# Patient Record
Sex: Female | Born: 1940 | Race: White | Hispanic: No | Smoking: Former smoker
Health system: Southern US, Community
[De-identification: ages and names within clinical notes are randomized; demographics above are authoritative.]

## PROBLEM LIST (undated history)

## (undated) DIAGNOSIS — M549 Dorsalgia, unspecified: Secondary | ICD-10-CM

## (undated) DIAGNOSIS — K449 Diaphragmatic hernia without obstruction or gangrene: Secondary | ICD-10-CM

## (undated) DIAGNOSIS — J45909 Unspecified asthma, uncomplicated: Secondary | ICD-10-CM

## (undated) DIAGNOSIS — I10 Essential (primary) hypertension: Secondary | ICD-10-CM

## (undated) DIAGNOSIS — F1020 Alcohol dependence, uncomplicated: Secondary | ICD-10-CM

## (undated) DIAGNOSIS — M8580 Other specified disorders of bone density and structure, unspecified site: Secondary | ICD-10-CM

## (undated) DIAGNOSIS — M51369 Other intervertebral disc degeneration, lumbar region without mention of lumbar back pain or lower extremity pain: Secondary | ICD-10-CM

## (undated) DIAGNOSIS — F1910 Other psychoactive substance abuse, uncomplicated: Secondary | ICD-10-CM

## (undated) DIAGNOSIS — H33011 Retinal detachment with single break, right eye: Secondary | ICD-10-CM

## (undated) DIAGNOSIS — G8929 Other chronic pain: Secondary | ICD-10-CM

## (undated) DIAGNOSIS — K222 Esophageal obstruction: Secondary | ICD-10-CM

## (undated) DIAGNOSIS — D500 Iron deficiency anemia secondary to blood loss (chronic): Principal | ICD-10-CM

## (undated) DIAGNOSIS — M1990 Unspecified osteoarthritis, unspecified site: Secondary | ICD-10-CM

## (undated) DIAGNOSIS — E538 Deficiency of other specified B group vitamins: Secondary | ICD-10-CM

## (undated) DIAGNOSIS — E785 Hyperlipidemia, unspecified: Secondary | ICD-10-CM

## (undated) DIAGNOSIS — N200 Calculus of kidney: Secondary | ICD-10-CM

## (undated) DIAGNOSIS — G2581 Restless legs syndrome: Secondary | ICD-10-CM

## (undated) DIAGNOSIS — K219 Gastro-esophageal reflux disease without esophagitis: Secondary | ICD-10-CM

## (undated) DIAGNOSIS — M5136 Other intervertebral disc degeneration, lumbar region: Secondary | ICD-10-CM

## (undated) HISTORY — DX: Alcohol dependence, uncomplicated: F10.20

## (undated) HISTORY — DX: Retinal detachment with single break, right eye: H33.011

## (undated) HISTORY — DX: Esophageal obstruction: K22.2

## (undated) HISTORY — DX: Other specified disorders of bone density and structure, unspecified site: M85.80

## (undated) HISTORY — PX: DILATION AND CURETTAGE OF UTERUS: SHX78

## (undated) HISTORY — DX: Diaphragmatic hernia without obstruction or gangrene: K44.9

## (undated) HISTORY — DX: Hyperlipidemia, unspecified: E78.5

## (undated) HISTORY — DX: Unspecified osteoarthritis, unspecified site: M19.90

## (undated) HISTORY — DX: Other psychoactive substance abuse, uncomplicated: F19.10

## (undated) HISTORY — DX: Gastro-esophageal reflux disease without esophagitis: K21.9

## (undated) HISTORY — DX: Deficiency of other specified B group vitamins: E53.8

## (undated) HISTORY — DX: Calculus of kidney: N20.0

## (undated) HISTORY — DX: Restless legs syndrome: G25.81

## (undated) HISTORY — PX: JOINT REPLACEMENT: SHX530

## (undated) HISTORY — DX: Iron deficiency anemia secondary to blood loss (chronic): D50.0

## (undated) HISTORY — PX: ABDOMINOPLASTY: SUR9

## (undated) HISTORY — PX: FACIAL COSMETIC SURGERY: SHX629

## (undated) HISTORY — PX: LIPOSUCTION: SHX10

---

## 1999-02-26 ENCOUNTER — Encounter (HOSPITAL_COMMUNITY): Admission: RE | Admit: 1999-02-26 | Discharge: 1999-03-26 | Payer: Self-pay | Admitting: Family Medicine

## 1999-04-16 ENCOUNTER — Other Ambulatory Visit: Admission: RE | Admit: 1999-04-16 | Discharge: 1999-04-16 | Payer: Self-pay | Admitting: Family Medicine

## 2000-07-10 ENCOUNTER — Other Ambulatory Visit: Admission: RE | Admit: 2000-07-10 | Discharge: 2000-07-10 | Payer: Self-pay | Admitting: Family Medicine

## 2001-09-22 ENCOUNTER — Other Ambulatory Visit: Admission: RE | Admit: 2001-09-22 | Discharge: 2001-09-22 | Payer: Self-pay | Admitting: Family Medicine

## 2001-10-19 ENCOUNTER — Encounter: Admission: RE | Admit: 2001-10-19 | Discharge: 2001-10-19 | Payer: Self-pay | Admitting: Family Medicine

## 2001-10-19 ENCOUNTER — Encounter: Payer: Self-pay | Admitting: Family Medicine

## 2002-12-21 ENCOUNTER — Other Ambulatory Visit: Admission: RE | Admit: 2002-12-21 | Discharge: 2002-12-21 | Payer: Self-pay | Admitting: Family Medicine

## 2003-07-26 ENCOUNTER — Encounter: Admission: RE | Admit: 2003-07-26 | Discharge: 2003-10-24 | Payer: Self-pay

## 2003-10-28 ENCOUNTER — Encounter: Admission: RE | Admit: 2003-10-28 | Discharge: 2004-01-26 | Payer: Self-pay

## 2004-03-13 ENCOUNTER — Encounter: Admission: RE | Admit: 2004-03-13 | Discharge: 2004-03-13 | Payer: Self-pay | Admitting: Family Medicine

## 2004-03-13 ENCOUNTER — Other Ambulatory Visit: Admission: RE | Admit: 2004-03-13 | Discharge: 2004-03-13 | Payer: Self-pay | Admitting: Family Medicine

## 2004-04-06 ENCOUNTER — Ambulatory Visit: Payer: Self-pay | Admitting: Internal Medicine

## 2005-04-09 ENCOUNTER — Ambulatory Visit: Payer: Self-pay | Admitting: Family Medicine

## 2005-04-10 ENCOUNTER — Ambulatory Visit: Payer: Self-pay | Admitting: Family Medicine

## 2005-04-16 ENCOUNTER — Encounter: Admission: RE | Admit: 2005-04-16 | Discharge: 2005-04-16 | Payer: Self-pay | Admitting: Family Medicine

## 2005-04-16 ENCOUNTER — Other Ambulatory Visit: Admission: RE | Admit: 2005-04-16 | Discharge: 2005-04-16 | Payer: Self-pay | Admitting: Family Medicine

## 2005-04-16 ENCOUNTER — Ambulatory Visit: Payer: Self-pay | Admitting: Family Medicine

## 2005-04-16 ENCOUNTER — Encounter: Payer: Self-pay | Admitting: Family Medicine

## 2005-06-14 ENCOUNTER — Ambulatory Visit: Payer: Self-pay | Admitting: Family Medicine

## 2006-03-21 ENCOUNTER — Ambulatory Visit: Payer: Self-pay | Admitting: Internal Medicine

## 2006-06-02 ENCOUNTER — Ambulatory Visit: Payer: Self-pay | Admitting: Family Medicine

## 2006-06-02 LAB — CONVERTED CEMR LAB
Alkaline Phosphatase: 43 units/L (ref 39–117)
Basophils Absolute: 0 10*3/uL (ref 0.0–0.1)
CO2: 31 meq/L (ref 19–32)
Calcium: 9.3 mg/dL (ref 8.4–10.5)
Chloride: 110 meq/L (ref 96–112)
Chol/HDL Ratio, serum: 2.2
Cholesterol: 169 mg/dL (ref 0–200)
Glomerular Filtration Rate, Af Am: 129 mL/min/{1.73_m2}
Glucose, Bld: 101 mg/dL — ABNORMAL HIGH (ref 70–99)
LDL Cholesterol: 83 mg/dL (ref 0–99)
Lymphocytes Relative: 40.2 % (ref 12.0–46.0)
MCV: 89.9 fL (ref 78.0–100.0)
Monocytes Absolute: 0.4 10*3/uL (ref 0.2–0.7)
Monocytes Relative: 7.6 % (ref 3.0–11.0)
Platelets: 410 10*3/uL — ABNORMAL HIGH (ref 150–400)
Sodium: 145 meq/L (ref 135–145)
TSH: 1.35 microintl units/mL (ref 0.35–5.50)
Total Protein: 6.7 g/dL (ref 6.0–8.3)
VLDL: 10 mg/dL (ref 0–40)

## 2006-06-03 HISTORY — PX: LUMBAR DISC SURGERY: SHX700

## 2006-06-09 ENCOUNTER — Other Ambulatory Visit: Admission: RE | Admit: 2006-06-09 | Discharge: 2006-06-09 | Payer: Self-pay | Admitting: Family Medicine

## 2006-06-09 ENCOUNTER — Ambulatory Visit: Payer: Self-pay | Admitting: Family Medicine

## 2006-06-09 ENCOUNTER — Encounter: Payer: Self-pay | Admitting: Family Medicine

## 2006-06-26 ENCOUNTER — Ambulatory Visit: Payer: Self-pay | Admitting: Family Medicine

## 2006-10-20 ENCOUNTER — Ambulatory Visit: Payer: Self-pay | Admitting: Internal Medicine

## 2006-12-17 ENCOUNTER — Ambulatory Visit (HOSPITAL_COMMUNITY): Admission: RE | Admit: 2006-12-17 | Discharge: 2006-12-17 | Payer: Self-pay | Admitting: Urology

## 2006-12-17 ENCOUNTER — Emergency Department (HOSPITAL_COMMUNITY): Admission: EM | Admit: 2006-12-17 | Discharge: 2006-12-17 | Payer: Self-pay | Admitting: Emergency Medicine

## 2006-12-17 ENCOUNTER — Encounter (INDEPENDENT_AMBULATORY_CARE_PROVIDER_SITE_OTHER): Payer: Self-pay | Admitting: *Deleted

## 2007-01-22 ENCOUNTER — Telehealth (INDEPENDENT_AMBULATORY_CARE_PROVIDER_SITE_OTHER): Payer: Self-pay | Admitting: *Deleted

## 2007-02-19 ENCOUNTER — Encounter
Admission: RE | Admit: 2007-02-19 | Discharge: 2007-02-19 | Payer: Self-pay | Admitting: Physical Medicine and Rehabilitation

## 2007-03-01 ENCOUNTER — Encounter
Admission: RE | Admit: 2007-03-01 | Discharge: 2007-03-01 | Payer: Self-pay | Admitting: Physical Medicine and Rehabilitation

## 2007-03-02 ENCOUNTER — Encounter: Payer: Self-pay | Admitting: Family Medicine

## 2007-06-16 ENCOUNTER — Encounter: Admission: RE | Admit: 2007-06-16 | Discharge: 2007-06-16 | Payer: Self-pay | Admitting: Neurosurgery

## 2007-07-15 ENCOUNTER — Inpatient Hospital Stay (HOSPITAL_COMMUNITY): Admission: RE | Admit: 2007-07-15 | Discharge: 2007-07-19 | Payer: Self-pay | Admitting: Neurosurgery

## 2007-08-17 ENCOUNTER — Ambulatory Visit: Payer: Self-pay | Admitting: Family Medicine

## 2007-08-17 LAB — CONVERTED CEMR LAB
ALT: 13 units/L (ref 0–35)
AST: 18 units/L (ref 0–37)
Albumin: 3.8 g/dL (ref 3.5–5.2)
Alkaline Phosphatase: 61 units/L (ref 39–117)
Basophils Absolute: 0.1 10*3/uL (ref 0.0–0.1)
Chloride: 105 meq/L (ref 96–112)
Cholesterol: 178 mg/dL (ref 0–200)
Creatinine, Ser: 0.5 mg/dL (ref 0.4–1.2)
HCT: 35.1 % — ABNORMAL LOW (ref 36.0–46.0)
LDL Cholesterol: 88 mg/dL (ref 0–99)
MCHC: 32.6 g/dL (ref 30.0–36.0)
Neutrophils Relative %: 65 % (ref 43.0–77.0)
Nitrite: NEGATIVE
Protein, U semiquant: NEGATIVE
RBC: 4.03 M/uL (ref 3.87–5.11)
RDW: 12.7 % (ref 11.5–14.6)
Sodium: 139 meq/L (ref 135–145)
TSH: 0.49 microintl units/mL (ref 0.35–5.50)
Total Bilirubin: 0.5 mg/dL (ref 0.3–1.2)
Total CHOL/HDL Ratio: 2.3
Urobilinogen, UA: 0.2
WBC Urine, dipstick: NEGATIVE
WBC: 7 10*3/uL (ref 4.5–10.5)

## 2007-09-14 ENCOUNTER — Encounter: Payer: Self-pay | Admitting: Family Medicine

## 2007-09-14 ENCOUNTER — Other Ambulatory Visit: Admission: RE | Admit: 2007-09-14 | Discharge: 2007-09-14 | Payer: Self-pay | Admitting: Family Medicine

## 2007-09-14 ENCOUNTER — Ambulatory Visit: Payer: Self-pay | Admitting: Family Medicine

## 2007-09-14 DIAGNOSIS — E785 Hyperlipidemia, unspecified: Secondary | ICD-10-CM

## 2007-09-14 DIAGNOSIS — M810 Age-related osteoporosis without current pathological fracture: Secondary | ICD-10-CM | POA: Insufficient documentation

## 2007-10-12 ENCOUNTER — Telehealth: Payer: Self-pay | Admitting: Family Medicine

## 2007-12-10 ENCOUNTER — Ambulatory Visit: Payer: Self-pay | Admitting: Family Medicine

## 2007-12-10 ENCOUNTER — Telehealth: Payer: Self-pay | Admitting: *Deleted

## 2007-12-17 ENCOUNTER — Telehealth: Payer: Self-pay | Admitting: Family Medicine

## 2007-12-21 ENCOUNTER — Telehealth: Payer: Self-pay | Admitting: Family Medicine

## 2007-12-24 ENCOUNTER — Telehealth: Payer: Self-pay | Admitting: Internal Medicine

## 2007-12-28 ENCOUNTER — Telehealth: Payer: Self-pay | Admitting: Internal Medicine

## 2008-01-04 ENCOUNTER — Telehealth: Payer: Self-pay | Admitting: *Deleted

## 2008-01-04 ENCOUNTER — Ambulatory Visit: Payer: Self-pay | Admitting: Family Medicine

## 2008-01-04 ENCOUNTER — Ambulatory Visit: Payer: Self-pay | Admitting: Internal Medicine

## 2008-01-19 ENCOUNTER — Telehealth: Payer: Self-pay | Admitting: Family Medicine

## 2008-01-29 ENCOUNTER — Ambulatory Visit: Payer: Self-pay | Admitting: Critical Care Medicine

## 2008-02-01 ENCOUNTER — Ambulatory Visit (HOSPITAL_COMMUNITY): Admission: RE | Admit: 2008-02-01 | Discharge: 2008-02-01 | Payer: Self-pay | Admitting: Critical Care Medicine

## 2008-02-01 ENCOUNTER — Encounter: Payer: Self-pay | Admitting: Critical Care Medicine

## 2008-02-22 ENCOUNTER — Encounter: Payer: Self-pay | Admitting: Family Medicine

## 2008-04-18 ENCOUNTER — Telehealth: Payer: Self-pay | Admitting: Family Medicine

## 2008-05-05 ENCOUNTER — Telehealth: Payer: Self-pay | Admitting: Internal Medicine

## 2008-05-05 ENCOUNTER — Ambulatory Visit: Payer: Self-pay | Admitting: Critical Care Medicine

## 2008-05-05 DIAGNOSIS — G2581 Restless legs syndrome: Secondary | ICD-10-CM | POA: Insufficient documentation

## 2008-05-05 DIAGNOSIS — J45909 Unspecified asthma, uncomplicated: Secondary | ICD-10-CM

## 2008-05-12 ENCOUNTER — Encounter: Payer: Self-pay | Admitting: Internal Medicine

## 2008-06-01 ENCOUNTER — Ambulatory Visit: Payer: Self-pay | Admitting: Internal Medicine

## 2008-06-03 DIAGNOSIS — K222 Esophageal obstruction: Secondary | ICD-10-CM

## 2008-06-03 HISTORY — DX: Esophageal obstruction: K22.2

## 2008-06-07 ENCOUNTER — Ambulatory Visit: Payer: Self-pay | Admitting: Internal Medicine

## 2008-06-07 ENCOUNTER — Encounter: Payer: Self-pay | Admitting: Internal Medicine

## 2008-06-09 ENCOUNTER — Encounter: Payer: Self-pay | Admitting: Internal Medicine

## 2008-07-21 ENCOUNTER — Ambulatory Visit: Payer: Self-pay | Admitting: Critical Care Medicine

## 2008-11-29 ENCOUNTER — Encounter: Payer: Self-pay | Admitting: Family Medicine

## 2008-11-29 ENCOUNTER — Ambulatory Visit: Payer: Self-pay | Admitting: Internal Medicine

## 2008-12-22 ENCOUNTER — Ambulatory Visit: Payer: Self-pay | Admitting: Critical Care Medicine

## 2009-01-25 ENCOUNTER — Telehealth: Payer: Self-pay | Admitting: Family Medicine

## 2009-03-08 ENCOUNTER — Encounter (INDEPENDENT_AMBULATORY_CARE_PROVIDER_SITE_OTHER): Payer: Self-pay | Admitting: *Deleted

## 2009-03-09 ENCOUNTER — Ambulatory Visit: Payer: Self-pay | Admitting: Critical Care Medicine

## 2009-03-24 ENCOUNTER — Encounter: Payer: Self-pay | Admitting: *Deleted

## 2009-03-29 ENCOUNTER — Encounter: Payer: Self-pay | Admitting: Family Medicine

## 2009-04-05 ENCOUNTER — Ambulatory Visit: Payer: Self-pay | Admitting: Family Medicine

## 2009-04-05 LAB — CONVERTED CEMR LAB
AST: 24 units/L (ref 0–37)
Albumin: 4 g/dL (ref 3.5–5.2)
Alkaline Phosphatase: 50 units/L (ref 39–117)
Basophils Absolute: 0 10*3/uL (ref 0.0–0.1)
Basophils Relative: 0.3 % (ref 0.0–3.0)
Bilirubin, Direct: 0 mg/dL (ref 0.0–0.3)
Blood in Urine, dipstick: NEGATIVE
Calcium: 8.8 mg/dL (ref 8.4–10.5)
Creatinine, Ser: 0.7 mg/dL (ref 0.4–1.2)
GFR calc non Af Amer: 88.4 mL/min (ref >60)
HDL: 89.2 mg/dL (ref >39.00)
Hemoglobin: 11.8 g/dL — ABNORMAL LOW (ref 12.0–15.0)
Ketones, urine, test strip: NEGATIVE
Lymphocytes Relative: 38.3 % (ref 12.0–46.0)
Monocytes Relative: 7.5 % (ref 3.0–12.0)
Neutro Abs: 2.1 10*3/uL (ref 1.4–7.7)
Neutrophils Relative %: 49.5 % (ref 43.0–77.0)
Nitrite: NEGATIVE
Protein, U semiquant: NEGATIVE
RBC: 4.07 M/uL (ref 3.87–5.11)
Sodium: 139 meq/L (ref 135–145)
Total CHOL/HDL Ratio: 3
Triglycerides: 78 mg/dL (ref 0.0–149.0)
Urobilinogen, UA: 0.2
VLDL: 15.6 mg/dL (ref 0.0–40.0)

## 2009-04-18 ENCOUNTER — Ambulatory Visit: Payer: Self-pay | Admitting: Family Medicine

## 2009-04-18 ENCOUNTER — Encounter: Payer: Self-pay | Admitting: Family Medicine

## 2009-04-18 ENCOUNTER — Other Ambulatory Visit: Admission: RE | Admit: 2009-04-18 | Discharge: 2009-04-18 | Payer: Self-pay | Admitting: Family Medicine

## 2009-04-18 LAB — HM PAP SMEAR

## 2009-06-05 ENCOUNTER — Telehealth: Payer: Self-pay | Admitting: Family Medicine

## 2009-06-07 ENCOUNTER — Telehealth: Payer: Self-pay | Admitting: Family Medicine

## 2009-07-20 ENCOUNTER — Ambulatory Visit: Payer: Self-pay | Admitting: Critical Care Medicine

## 2009-07-20 DIAGNOSIS — K219 Gastro-esophageal reflux disease without esophagitis: Secondary | ICD-10-CM

## 2009-09-28 ENCOUNTER — Encounter: Payer: Self-pay | Admitting: *Deleted

## 2009-11-09 ENCOUNTER — Encounter: Admission: RE | Admit: 2009-11-09 | Discharge: 2010-01-02 | Payer: Self-pay | Admitting: Orthopedic Surgery

## 2009-11-16 ENCOUNTER — Ambulatory Visit: Payer: Self-pay | Admitting: Critical Care Medicine

## 2009-12-04 ENCOUNTER — Emergency Department (HOSPITAL_BASED_OUTPATIENT_CLINIC_OR_DEPARTMENT_OTHER): Admission: EM | Admit: 2009-12-04 | Discharge: 2009-12-04 | Payer: Self-pay | Admitting: Emergency Medicine

## 2009-12-05 ENCOUNTER — Telehealth: Payer: Self-pay | Admitting: Family Medicine

## 2009-12-08 ENCOUNTER — Encounter (HOSPITAL_BASED_OUTPATIENT_CLINIC_OR_DEPARTMENT_OTHER): Admission: RE | Admit: 2009-12-08 | Discharge: 2010-03-08 | Payer: Self-pay | Admitting: Internal Medicine

## 2010-01-05 ENCOUNTER — Telehealth (INDEPENDENT_AMBULATORY_CARE_PROVIDER_SITE_OTHER): Payer: Self-pay | Admitting: *Deleted

## 2010-01-08 ENCOUNTER — Telehealth (INDEPENDENT_AMBULATORY_CARE_PROVIDER_SITE_OTHER): Payer: Self-pay | Admitting: *Deleted

## 2010-03-07 ENCOUNTER — Emergency Department (HOSPITAL_COMMUNITY): Admission: EM | Admit: 2010-03-07 | Discharge: 2010-03-07 | Payer: Self-pay | Admitting: Emergency Medicine

## 2010-03-30 LAB — HM MAMMOGRAPHY

## 2010-04-02 ENCOUNTER — Encounter: Payer: Self-pay | Admitting: Family Medicine

## 2010-04-03 ENCOUNTER — Encounter: Payer: Self-pay | Admitting: Family Medicine

## 2010-04-10 ENCOUNTER — Encounter: Payer: Self-pay | Admitting: Family Medicine

## 2010-04-19 ENCOUNTER — Ambulatory Visit: Payer: Self-pay | Admitting: Critical Care Medicine

## 2010-05-18 ENCOUNTER — Telehealth: Payer: Self-pay | Admitting: Family Medicine

## 2010-06-12 ENCOUNTER — Telehealth: Payer: Self-pay | Admitting: Family Medicine

## 2010-06-13 ENCOUNTER — Ambulatory Visit: Admit: 2010-06-13 | Payer: Self-pay | Admitting: Family Medicine

## 2010-06-22 ENCOUNTER — Telehealth: Payer: Self-pay | Admitting: Family Medicine

## 2010-06-22 ENCOUNTER — Ambulatory Visit: Admit: 2010-06-22 | Payer: Self-pay | Admitting: Family Medicine

## 2010-06-25 ENCOUNTER — Encounter: Payer: Self-pay | Admitting: Physical Medicine and Rehabilitation

## 2010-07-03 NOTE — Assessment & Plan Note (Signed)
Summary: Pulmonary OV   Primary Provider/Referring Provider:  Roderick Pee MD  CC:  4 mo follow up.  states breathing is the same-no better or worse.  states qvar is helping a lot with cough-not coughing as much-nonprod.  deneis wheezing and chest tightness.  c/o runny nose x1-1/2 mo.  Marland Kitchen  History of Present Illness: Pulmonary OV:       This is a 70 years old female who presents with cough.  Methacholine challenge Positve.  GERD ppts cough.  Lower airway inflammation with positive response to ICS and PPI Rx.  July 20, 2009 10:50 AM Pt is doing well with minimal cough.  Pt stays on qvar and has no chest pain.  No proair use is noted. No wheeze.  Dyspnea is stable.   Pt denies any significant sore throat, nasal congestion or excess secretions, fever, chills, sweats, unintended weight loss, pleurtic or exertional chest pain, orthopnea PND, or leg swelling Pt denies any increase in rescue therapy over baseline, denies waking up needing it or having any early am or nocturnal exacerbations of coughing/wheezing/or dyspnea.    Asthma History    Asthma Control Assessment:    Age range: 12+ years    Symptoms: 0-2 days/week    Nighttime Awakenings: 0-2/month    Interferes w/ normal activity: some limitations    SABA use (not for EIB): 0-2 days/week    ATAQ questionnaire: 0    FEV1: 2.49 liters (today)    FEV1 Pred: 2.50 liters (today)    Exacerbations requiring oral systemic steroids: 0-1/year    Asthma Control Assessment: Not Well Controlled   Preventive Screening-Counseling & Management  Alcohol-Tobacco     Smoking Status: quit > 6 months  Current Medications (verified): 1)  Wellbutrin Sr 150 Mg Tb12 (Bupropion Hcl) .Marland Kitchen.. 1 Tab @ Bedtime 2)  Zetia 10 Mg Tabs (Ezetimibe) .Marland Kitchen.. 1 By Mouth Daily 3)  Clonazepam 0.5 Mg Tabs (Clonazepam) .Marland Kitchen.. 1 By Mouth At Bedtime As Needed 4)  Adult Aspirin Ec Low Strength 81 Mg Tbec (Aspirin) .Marland Kitchen.. 1 By Mouth Daily 5)  Vitamin D 2000 Unit Tabs  (Cholecalciferol) .Marland Kitchen.. 1 By Mouth Daily 6)  Calcium 600 600 Mg Tabs (Calcium Carbonate) .Marland Kitchen.. 1 By Mouth Daily 7)  Qvar 80 Mcg/act  Aers (Beclomethasone Dipropionate) .... Three Puffs Twice Daily 8)  Fish Oil   Oil (Fish Oil) .... 1200mg  Once Daily 9)  Proair Hfa 108 (90 Base) Mcg/act  Aers (Albuterol Sulfate) .Marland Kitchen.. 1-2 Puffs Every 4-6 Hours As Needed 10)  Neurontin 300 Mg Caps (Gabapentin) .... 2 By Mouth At Bedtime 11)  Omeprazole 40 Mg Cpdr (Omeprazole) .... Take One Tab Once Daily 12)  Pramipexole Dihydrochloride 0.25 Mg Tabs (Pramipexole Dihydrochloride) .... Take One Tab Two Times A Day  Allergies (verified): 1)  ! Penicillin  Past History:  Past medical, surgical, family and social histories (including risk factors) reviewed, and no changes noted (except as noted below).  Past Medical History: Reviewed history from 07/21/2008 and no changes required. 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 lumbar disc surgery 2008 cosmetic facial surgery (eyes, nose) renal stones 2008 abdominoplasty D & C X 2 Liposuction  Family History: Reviewed history from 05/05/2008 and no changes required. father  died at 32, and throat cancer.  Smoker  mother had heart disease, hypertension, smoker fractured hip, and Alzheimer's  one brother had stroke at age 40 smoker  one sister in good health  Social History: Reviewed history from 01/29/2008 and no changes required. Retired Married Alcohol use-no Drug use-no Regular exercise-yes Patient states former smoker.  Positive history of passive tobacco  smoke exposure.   Review of Systems       The patient complains of shortness of breath with activity, productive cough, and nasal congestion/difficulty breathing through nose.  The patient denies shortness of breath at rest, non-productive cough, coughing up blood, chest pain, irregular heartbeats, acid heartburn, indigestion, loss of appetite, weight change, abdominal pain, difficulty swallowing, sore throat, tooth/dental problems, headaches, sneezing, itching, ear ache, anxiety, depression, hand/feet swelling, joint stiffness or pain, rash, change in color of mucus, and fever.    Clinical Reports Reviewed:  PFT's:  01/29/2008: FEF 25/75 %Predicted:  94 FEV1 %Predicted:  99 FEV1/FVC %Predicted:  103 FVC %Predicted:  99   Vital Signs:  Patient profile:   70 year old female Menstrual status:  postmenopausal Height:      66 inches Weight:      166 pounds BMI:     26.89 O2 Sat:      98 % on Room air Temp:     98.0 degrees F oral Pulse rate:   93 / minute BP sitting:   134 / 80  (left arm) Cuff size:   regular  Vitals Entered By: Gweneth Dimitri RN (July 20, 2009 10:37 AM)  O2 Flow:  Room air CC: 4 mo follow up.  states breathing is the same-no better or worse.  states qvar is helping a lot with cough-not coughing as much-nonprod.  deneis wheezing and chest tightness.  c/o runny nose x1-1/2 mo.   Comments Medications reviewed with patient Daytime contact number verified with patient. Gweneth Dimitri RN  July 20, 2009 10:37 AM    Physical Exam  Additional Exam:  Gen: WD WN      WF    in NAD    NCAT Heent:  no jvd, no TMG, no cervical LNademopathy, orophyx clear,  nares with clear watery drainage. 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 Pos response to PPI and ICS,  cyclic cough primary symptom complex plan No change in inhaled medications.   Maintain treatment program as currently prescribed.  Problem # 2:  GERD (ICD-530.81) Assessment: Improved stable gerd plan cont ppi Her updated medication list for this problem includes:    Omeprazole 40 Mg Cpdr (Omeprazole) .Marland Kitchen... Take one tab once daily  Complete Medication List: 1)  Wellbutrin Sr 150 Mg Tb12 (Bupropion hcl) .Marland Kitchen.. 1 tab @ bedtime 2)  Zetia 10 Mg Tabs (Ezetimibe) .Marland Kitchen.. 1 by mouth daily 3)  Clonazepam 0.5 Mg Tabs (Clonazepam) .Marland Kitchen.. 1 by mouth at bedtime as needed 4)  Adult Aspirin Ec Low Strength 81  Mg Tbec (Aspirin) .Marland Kitchen.. 1 by mouth daily 5)  Vitamin D 2000 Unit Tabs (Cholecalciferol) .Marland Kitchen.. 1 by mouth daily 6)  Calcium 600 600 Mg Tabs (Calcium carbonate) .Marland Kitchen.. 1 by mouth daily 7)  Qvar 80 Mcg/act Aers (Beclomethasone dipropionate) .... Three puffs twice daily 8)  Fish Oil Oil (Fish oil) .... 1200mg  once daily 9)  Proair Hfa 108 (90 Base) Mcg/act Aers (Albuterol sulfate) .Marland Kitchen.. 1-2 puffs every 4-6 hours as needed 10)  Neurontin 300 Mg Caps (Gabapentin) .... 2 by mouth at bedtime 11)  Omeprazole 40 Mg Cpdr (Omeprazole) .... Take one tab once daily 12)  Pramipexole Dihydrochloride 0.25 Mg Tabs (Pramipexole dihydrochloride) .... Take one tab two times a day  Other Orders: Est. Patient Level III (04540)  Patient Instructions: 1)  No change in medications 2)  Return in     4     months Prescriptions: QVAR 80 MCG/ACT  AERS (BECLOMETHASONE DIPROPIONATE) Three puffs twice daily Brand medically necessary #1 x 6   Entered and Authorized by:   Storm Frisk MD   Signed by:   Storm Frisk MD on 07/20/2009   Method used:   Electronically to         [facility] [address] #[id number]* (retail)       [address]       [place], [place]  [address]       Ph: [phone]       Fax: [phone]   RxID:   [id number]

## 2010-07-03 NOTE — Miscellaneous (Signed)
Summary: mammogram update   Clinical Lists Changes  Observations: Added new observation of MAMMOGRAM: further imaging of right breast (03/30/2010 11:52)      Preventive Care Screening  Mammogram:    Date:  03/30/2010    Results:  further imaging of right breast

## 2010-07-03 NOTE — Miscellaneous (Signed)
Summary: mammogram   Clinical Lists Changes

## 2010-07-03 NOTE — Progress Notes (Signed)
Summary: refill  Phone Note Call from Patient Call back at Home Phone [phone]   Caller: Patient Call For: wright Reason for Call: Refill Medication Summary of Call: Need refill for quvar 2m, ok to [facility].//[facility] [address] Initial call taken by: Darletta Moll,  January 08, 2010 9:27 AM  Follow-up for Phone Call        Received Qvar rx request.  However, at last OV on 6.16.11, pt was instructed to reduce Qvar to 2 puffs x5 days then 1 puff x5 days then stop.  If cough returned, she was instructed to call us for directions on restart of Qvar.  No phone note regarding pt restarting qvar.  Called, spoke with pt.  Pt states she started back on qvar "on my own"  because she got to coughing "really, really bad" and "couldn't take deep breathing when walking in the mornings."  States since restarting Qvar the sxs are "a lot better."    Dr. Delford Field, pls advise if ok for pt to cont on Qvar.  Thanks! Follow-up by: Gweneth Dimitri RN,  January 08, 2010 9:37 AM  Additional Follow-up for Phone Call Additional follow up Details #1::        she should resume Qvar at two puff two times a day give 1 cannister and 6 refills Additional Follow-up by: Storm Frisk MD,  January 08, 2010 1:10 PM    Additional Follow-up for Phone Call Additional follow up Details #2::    LMOVM  (per pt request), that qvar has been refilled and to take 2 puffs two times a day.   Follow-up by: Vernie Murders,  January 08, 2010 1:26 PM  New/Updated Medications: QVAR 80 MCG/ACT AERS (BECLOMETHASONE DIPROPIONATE) 2 puffs two times a day Prescriptions: QVAR 80 MCG/ACT AERS (BECLOMETHASONE DIPROPIONATE) 2 puffs two times a day  #1 x 6   Entered by:   Vernie Murders   Authorized by:   Storm Frisk MD   Signed by:   Vernie Murders on 01/08/2010   Method used:   Electronically to        [facility] #[id number]* (retail)       [address]       [place], [place]   [address]       Ph: [phone]       Fax: [phone]   RxID:   [id number]

## 2010-07-03 NOTE — Miscellaneous (Signed)
Summary: mammogram update   Clinical Lists Changes  Observations: Added new observation of MAMMOGRAM: normal left (09/28/2009 15:36)      Preventive Care Screening  Mammogram:    Date:  09/28/2009    Results:  normal left

## 2010-07-03 NOTE — Assessment & Plan Note (Signed)
Summary: Pulmonary OV   Primary Provider/Referring Provider:  Roderick Pee MD  CC:  4 month follow up.  Pt states breathing is doing "fine."  Pt states she does have a nonprod cough if she doesn't use qvar.  Denies wheezing and chest tightness.  No complaints.  .  History of Present Illness: Pulmonary OV:       This is a 70 years old female who presents with cough.  Methacholine challenge Positve.  GERD ppts cough.  Lower airway inflammation with positive response to ICS and PPI Rx.  November 16, 2009 2:30 PM On qvar the cough has resolved.  The pt is doing well.  There are no new issues.  She remains on PPI rx. There is no GERD symptoms. Pt denies any significant sore throat, nasal congestion or excess secretions, fever, chills, sweats, unintended weight loss, pleurtic or exertional chest pain, orthopnea PND, or leg swelling Pt denies any increase in rescue therapy over baseline, denies waking up needing it or having any early am or nocturnal exacerbations of coughing/wheezing/or dyspnea. Pt also denies any obvious fluctuation in symptoms wiht weather or environmental change or other alleviating or aggravating factors   Asthma History    Asthma Control Assessment:    Age range: 12+ years    Symptoms: 0-2 days/week    Nighttime Awakenings: 0-2/month    Interferes w/ normal activity: no limitations    SABA use (not for EIB): 0-2 days/week    ATAQ questionnaire: 0    FEV1: 2.49 liters (today)    FEV1 Pred: 2.50 liters (today)    Exacerbations requiring oral systemic steroids: 0-1/year    Asthma Control Assessment: Well Controlled   Preventive Screening-Counseling & Management  Alcohol-Tobacco     Smoking Status: quit > 6 months  Current Medications (verified): 1)  Wellbutrin Sr 150 Mg Tb12 (Bupropion Hcl) .Marland Kitchen.. 1 Tab @ Bedtime 2)  Red Yeast Rice 600 Mg Caps (Red Yeast Rice Extract) .... Take 1 Capsule By Mouth Once A Day 3)  Clonazepam 0.5 Mg Tabs (Clonazepam) .Marland Kitchen.. 1 By Mouth At  Bedtime As Needed 4)  Adult Aspirin Ec Low Strength 81 Mg Tbec (Aspirin) .Marland Kitchen.. 1 By Mouth Daily 5)  Vitamin D 2000 Unit Tabs (Cholecalciferol) .Marland Kitchen.. 1 By Mouth Daily 6)  Calcium 600 600 Mg Tabs (Calcium Carbonate) .Marland Kitchen.. 1 By Mouth Daily 7)  Qvar 80 Mcg/act  Aers (Beclomethasone Dipropionate) .... Three Puffs Twice Daily 8)  Fish Oil   Oil (Fish Oil) .... 1200mg  Once Daily 9)  Proair Hfa 108 (90 Base) Mcg/act  Aers (Albuterol Sulfate) .Marland Kitchen.. 1-2 Puffs Every 4-6 Hours As Needed 10)  Neurontin 300 Mg Caps (Gabapentin) .... 2 By Mouth At Bedtime 11)  Omeprazole 40 Mg Cpdr (Omeprazole) .... Take One Tab Once Daily 12)  Pramipexole Dihydrochloride 0.25 Mg Tabs (Pramipexole Dihydrochloride) .... Take One Tab Two Times A Day  Allergies (verified): 1)  ! Penicillin  Past History:  Past medical, surgical, family and social histories (including risk factors) reviewed, and no changes noted (except as noted below).  Past Medical History: Reviewed history from 07/21/2008 and no changes required. 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 lumbar disc surgery 2008 cosmetic facial surgery (eyes, nose) renal  stones 2008 abdominoplasty D & C X 2 Liposuction  Family History: Reviewed history from 05/05/2008 and no changes required. father died at 53, and throat cancer.  Smoker  mother had heart disease, hypertension, smoker fractured hip, and Alzheimer's  one brother had stroke at age 54 smoker  one sister in good health  Social History: Reviewed history from 01/29/2008 and no changes  required. Retired Married Alcohol use-no Drug use-no Regular exercise-yes Patient states former smoker.  Positive history of passive tobacco smoke exposure.   Review of Systems  The patient denies shortness of breath with activity, shortness of breath at rest, productive cough, non-productive cough, coughing up blood, chest pain, irregular heartbeats, acid heartburn, indigestion, loss of appetite, weight change, abdominal pain, difficulty swallowing, sore throat, tooth/dental problems, headaches, nasal congestion/difficulty breathing through nose, sneezing, itching, ear ache, anxiety, depression, hand/feet swelling, joint stiffness or pain, rash, change in color of mucus, and fever.    Vital Signs:  Patient profile:   70 year old female Menstrual status:  postmenopausal Height:      66 inches Weight:      164 pounds BMI:     26.57 O2 Sat:      99 % on Room air Temp:     98.0 degrees F oral Pulse rate:   85 / minute BP sitting:   144 / 76  (left arm) Cuff size:   regular  Vitals Entered By: Gweneth Dimitri RN (November 16, 2009 2:24 PM)  O2 Flow:  Room air CC: 4 month follow up.  Pt states breathing is doing "fine."  Pt states she does have a nonprod cough if she doesn't use qvar.  Denies wheezing and chest tightness.  No complaints.   Comments Medications reviewed with patient Daytime contact number verified with patient. Gweneth Dimitri RN  November 16, 2009 2:25 PM    Physical Exam  Additional Exam:  Gen: WD WN      WF    in NAD    NCAT Heent:  no jvd, no TMG, no cervical LNademopathy, orophyx clear,  nares with clear watery drainage. Cor: RRR nl s1/s2  no s3/s4  no m r h g Abd: soft NT BSA   no masses  No HSM  no rebound or guarding Ext perfused with no c v e v.d Neuro: intact, moves all 4s, CN II-XII intact, DTRs intact Chest: clear,  no pseudowheeze   Pre-Spirometry FEV1    Value: 2.49 L     Pred: 2.50 L     Impression & Recommendations:  Problem # 1:  ASTHMA, INTRINSIC  (ICD-493.10) Assessment Improved  Mild intermittent asthma ppt by GERD and chronic rhinitis with pos MeCh challenge test but normal BL spirometry. Pos response to PPI and ICS,  cyclic cough primary symptom complex  all improved plan Trial off ICS by weaning to Qvar 80 two puff daily for 5 days, then one puff daily for 5 days then stop Return 6 months  Medications Added to Medication List This Visit: 1)  Red Yeast Rice 600 Mg Caps (Red yeast rice extract) .... Take 1 capsule by mouth once a day 2)  Qvar 80 Mcg/act Aers (Beclomethasone dipropionate) .... Reduce to two  puffs once daily for 5 days then one puff daily for 5 days then stop  Complete Medication List: 1)  Wellbutrin Sr 150 Mg Tb12 (Bupropion hcl) .Marland Kitchen.. 1 tab @ bedtime 2)  Red Yeast Rice 600 Mg Caps (Red yeast rice extract) .... Take 1 capsule  by mouth once a day 3)  Clonazepam 0.5 Mg Tabs (Clonazepam) .Marland Kitchen.. 1 by mouth at bedtime as needed 4)  Adult Aspirin Ec Low Strength 81 Mg Tbec (Aspirin) .Marland Kitchen.. 1 by mouth daily 5)  Vitamin D 2000 Unit Tabs (Cholecalciferol) .Marland Kitchen.. 1 by mouth daily 6)  Calcium 600 600 Mg Tabs (Calcium carbonate) .Marland Kitchen.. 1 by mouth daily 7)  Qvar 80 Mcg/act Aers (Beclomethasone dipropionate) .... Reduce to two  puffs once daily for 5 days then one puff daily for 5 days then stop 8)  Fish Oil Oil (Fish oil) .... 1200mg  once daily 9)  Proair Hfa 108 (90 Base) Mcg/act Aers (Albuterol sulfate) .Marland Kitchen.. 1-2 puffs every 4-6 hours as needed 10)  Neurontin 300 Mg Caps (Gabapentin) .... 2 by mouth at bedtime 11)  Omeprazole 40 Mg Cpdr (Omeprazole) .... Take one tab once daily 12)  Pramipexole Dihydrochloride 0.25 Mg Tabs (Pramipexole dihydrochloride) .... Take one tab two times a day  Other Orders: Est. Patient Level III (81191)  Patient Instructions: 1)  Reduce Qvar to two puff daily for 5 days then one puff daily for 5 days then stop 2)  If the cough returns , call us for direction on restart of qvar 3)  Return [place]  office 6 months

## 2010-07-03 NOTE — Progress Notes (Signed)
Summary: refill  Phone Note Refill Request Message from:  Fax from Pharmacy on June 05, 2009 11:46 AM     New/Updated Medications: PRAMIPEXOLE DIHYDROCHLORIDE 0.25 MG TABS (PRAMIPEXOLE DIHYDROCHLORIDE) take one tab two times a day Prescriptions: PRAMIPEXOLE DIHYDROCHLORIDE 0.25 MG TABS (PRAMIPEXOLE DIHYDROCHLORIDE) take one tab two times a day  #200 x 3   Entered by:   Kern Reap CMA (AAMA)   Authorized by:   Roderick Pee MD   Signed by:   Kern Reap CMA (AAMA) on 06/05/2009   Method used:   Electronically to        [facility] #[id number]* (retail)       [address]       [place], [place]  [address]       Ph: [phone]       Fax: [phone]   RxID:   [id number]

## 2010-07-03 NOTE — Progress Notes (Signed)
Summary: Need Clarrifiaction re: Qvar  Phone Note Outgoing Call   Call placed by: Gweneth Dimitri RN,  January 05, 2010 4:41 PM Call placed to: Patient Summary of Call: Received Qvar rx request.  However, at last OV on 6.16.11, pt was instructed to reduce Qvar to 2 puffs x5 days then 1 puff x5 days then stop.  If cough returned, she was instructed to call us for directions on restart of Qvar.  No phone note regarding pt restarting qvar.  LMOMTCB  for clarrification. Initial call taken by: Gweneth Dimitri RN,  January 05, 2010 4:43 PM  Follow-up for Phone Call        duplicate phone message taken--pls see phone note from 01/08/10 for additonal information.  Gweneth Dimitri RN  January 08, 2010 9:34 AM

## 2010-07-03 NOTE — Progress Notes (Signed)
Summary: refill  Phone Note Refill Request Message from:  Fax from Pharmacy on June 07, 2009 8:56 AM  Refills Requested: Medication #1:  CLONAZEPAM 0.5 MG TABS 1 by mouth at bedtime as needed   Last Refilled: 05/01/2009  Method Requested: Electronic Initial call taken by: Trixie Dredge,  June 07, 2009 8:56 AM    Prescriptions: CLONAZEPAM 0.5 MG TABS (CLONAZEPAM) 1 by mouth at bedtime as needed  #100 x 2   Entered by:   Kern Reap CMA (AAMA)   Authorized by:   Roderick Pee MD   Signed by:   Kern Reap CMA (AAMA) on 06/07/2009   Method used:   Telephoned to ...       [id number]* (retail)       [address]       [place], [place]  [address]       Ph: [phone]       Fax: [phone]   RxID:   [id number]

## 2010-07-03 NOTE — Progress Notes (Signed)
Summary: clonazepam refill  Phone Note Refill Request Message from:  Fax from Pharmacy on December 05, 2009 4:16 PM  Refills Requested: Medication #1:  CLONAZEPAM 0.5 MG TABS 1 by mouth at bedtime as needed Initial call taken by: Kern Reap CMA Duncan Dull),  December 05, 2009 4:16 PM    Prescriptions: CLONAZEPAM 0.5 MG TABS (CLONAZEPAM) 1 by mouth at bedtime as needed  #100 x 1   Entered by:   Kern Reap CMA (AAMA)   Authorized by:   Roderick Pee MD   Signed by:   Kern Reap CMA (AAMA) on 12/05/2009   Method used:   Telephoned to ...       [facility] #[id number]* (retail)       [address]       [place], [place]  [address]       Ph: [phone]       Fax: [phone]   RxID:   [id number]

## 2010-07-03 NOTE — Assessment & Plan Note (Signed)
Summary: Pulmonary OV   Visit Type:  Follow-up Primary Provider/Referring Provider:  Roderick Pee MD  CC:  Pt states cough is reduced and breathing is the same. No new complaints.  History of Present Illness: Pulmonary OV:       This is a 70 years old female who presents with cough.  Methacholine challenge Positve.  GERD ppts cough.  Lower airway inflammation with positive response to ICS and PPI Rx.  November 16, 2009 2:30 PM On qvar the cough has resolved.  The pt is doing well.  There are no new issues.  She remains on PPI rx. There is no GERD symptoms. Pt denies any significant sore throat, nasal congestion or excess secretions, fever, chills, sweats, unintended weight loss, pleurtic or exertional chest pain, orthopnea PND, or leg swelling Pt denies any increase in rescue therapy over baseline, denies waking up needing it or having any early am or nocturnal exacerbations of coughing/wheezing/or dyspnea. Pt also denies any obvious fluctuation in symptoms wiht weather or environmental change or other alleviating or aggravating factors   April 19, 2010 10:56 AM   No real cough,  the qvar helps.  No real heartburn.  No new issues   Pt denies any significant sore throat, nasal congestion or excess secretions, fever, chills, sweats, unintended weight loss, pleurtic or exertional chest pain, orthopnea PND, or leg swelling Pt denies any increase in rescue therapy over baseline, denies waking up needing it or having any early am or nocturnal exacerbations of coughing/wheezing/or dyspnea.   Asthma History    Asthma Control Assessment:    Age range: 12+ years    Symptoms: 0-2 days/week    Nighttime Awakenings: 0-2/month    Interferes w/ normal activity: no limitations    SABA use (not for EIB): 0-2 days/week    ATAQ questionnaire: 0    FEV1: 2.49 liters (today)    FEV1 Pred: 2.50 liters (today)    Exacerbations requiring oral systemic steroids: 0-1/year    Asthma Control Assessment:  Well Controlled   Preventive Screening-Counseling & Management  Alcohol-Tobacco     Smoking Status: quit > 6 months     Year Quit: 1975     Pack years: 4  Current Medications (verified): 1)  Wellbutrin Sr 150 Mg Tb12 (Bupropion Hcl) .Marland Kitchen.. 1 Tab @ Bedtime 2)  Red Yeast Rice 600 Mg Caps (Red Yeast Rice Extract) .... Take 1 Capsule By Mouth Once A Day 3)  Clonazepam 0.5 Mg Tabs (Clonazepam) .Marland Kitchen.. 1 By Mouth At Bedtime As Needed 4)  Adult Aspirin Ec Low Strength 81 Mg Tbec (Aspirin) .Marland Kitchen.. 1 By Mouth Daily 5)  Vitamin D 2000 Unit Tabs (Cholecalciferol) .Marland Kitchen.. 1 By Mouth Daily 6)  Calcium 600 600 Mg Tabs (Calcium Carbonate) .Marland Kitchen.. 1 By Mouth Daily 7)  Fish Oil   Oil (Fish Oil) .... 1200mg  Once Daily 8)  Neurontin 300 Mg Caps (Gabapentin) .... 2 By Mouth At Bedtime 9)  Omeprazole 40 Mg Cpdr (Omeprazole) .... Take One Tab Once Daily 10)  Pramipexole Dihydrochloride 0.25 Mg Tabs (Pramipexole Dihydrochloride) .... Take One Tab Two Times A Day 11)  Qvar 80 Mcg/act Aers (Beclomethasone Dipropionate) .Marland Kitchen.. 1 Puffs Two Times A Day  Allergies (verified): 1)  ! Penicillin  Past History:  Past medical, surgical, family and social histories (including risk factors) reviewed, and no changes noted (except as noted below).  Past Medical History: Reviewed history from 07/21/2008 and no changes required. 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 lumbar disc surgery 2008 cosmetic facial surgery (eyes, nose) renal stones 2008 abdominoplasty D & C X 2 Liposuction  Family History: Reviewed history from 05/05/2008  and no changes required. father died at 84, and throat cancer.  Smoker  mother had heart disease, hypertension, smoker fractured hip, and Alzheimer's  one brother had stroke at age 93 smoker  one sister in good health  Social History: Reviewed history from 01/29/2008 and no changes required. Retired Married Alcohol use-no Drug use-no Regular exercise-yes Patient states former smoker.  Positive history of passive tobacco smoke exposure.   Review of Systems  The patient denies shortness of breath with activity, shortness of breath at rest, productive cough, non-productive cough, coughing up blood, chest pain, irregular heartbeats, acid heartburn, indigestion, loss of appetite, weight change, abdominal pain, difficulty swallowing, sore throat, tooth/dental problems, headaches, nasal congestion/difficulty breathing through nose, sneezing, itching, ear ache, anxiety, depression, hand/feet swelling, joint stiffness or pain, rash, change in color of mucus, and fever.    Vital Signs:  Patient profile:   70 year old female Menstrual status:  postmenopausal Height:      66 inches Weight:      169 pounds BMI:     27.38 O2 Sat:      97 % on Room air Temp:     98.0 degrees F oral Pulse rate:   70 / minute BP sitting:   140 / 80  (left arm) Cuff size:   regular  Vitals Entered By: Zackery Barefoot CMA (April 19, 2010 10:40 AM)  O2 Flow:  Room air CC: Pt states cough is reduced, breathing is the same. No new complaints Comments Medications reviewed with patient Verified contact number and pharmacy with patient Zackery Barefoot CMA  April 19, 2010 10:41 AM    Physical Exam  Additional Exam:  Gen: WD WN      WF    in NAD    NCAT Heent:  no jvd, no TMG, no cervical LNademopathy, orophyx clear,  nares with clear watery drainage. 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 Pos response to PPI and ICS,  cyclic cough primary symptom complex  all improved plan cont ics and ppi rov 6 months  Medications Added to Medication List This Visit: 1)  Qvar 80 Mcg/act Aers (Beclomethasone dipropionate) .Marland Kitchen.. 1 puffs two times a day 2)  Qvar 40 Mcg/act Aers (Beclomethasone dipropionate) .... Two puffs twice daily  Complete Medication List: 1)  Wellbutrin Sr 150 Mg Tb12 (Bupropion hcl) .Marland Kitchen.. 1 tab @ bedtime 2)  Red Yeast Rice 600 Mg Caps (Red yeast rice extract) .... Take 1 capsule by mouth once a day 3)  Clonazepam 0.5  Mg Tabs (Clonazepam) .Marland Kitchen.. 1 by mouth at bedtime as needed 4)  Adult Aspirin Ec Low Strength 81 Mg Tbec (Aspirin) .Marland Kitchen.. 1 by mouth daily 5)  Vitamin D 2000 Unit Tabs (Cholecalciferol) .Marland Kitchen.. 1 by mouth daily 6)  Calcium 600 600 Mg Tabs (Calcium carbonate) .Marland Kitchen.. 1 by mouth daily 7)  Fish Oil Oil (Fish oil) .... 1200mg  once daily 8)  Neurontin 300 Mg Caps (Gabapentin) .... 2 by mouth at bedtime 9)  Omeprazole 40 Mg Cpdr (Omeprazole) .... Take one tab once daily 10)  Pramipexole Dihydrochloride 0.25 Mg Tabs (Pramipexole dihydrochloride) .... Take one tab two times a day 11)  Qvar 40 Mcg/act Aers (Beclomethasone dipropionate) .... Two puffs twice daily  Other Orders: Est. Patient Level III (60737)  Patient Instructions: 1)  Change to Qvar when current Qvar runs out 2)  Return 4-5 months [place]  Prescriptions: QVAR 40 MCG/ACT  AERS (BECLOMETHASONE DIPROPIONATE) Two puffs twice daily  #1 x 6   Entered and Authorized by:   Storm Frisk MD   Signed by:   Storm Frisk MD on 04/19/2010   Method used:    Electronically to        [facility] [address] [phone]* (retail)       [address]       [place], [place]  [address]       Ph: [phone]       Fax: [phone]   RxID:   [id number]     Immunization History:  Influenza Immunization History:    Influenza:  historical (03/19/2010)   Prevention & Chronic Care Immunizations   Influenza vaccine: Historical  (03/19/2010)    Tetanus booster: 06/09/2006: Historical    Pneumococcal vaccine: Pneumovax  (03/10/2008)    H. zoster vaccine: 06/26/2006: Zostavax  Colorectal Screening   Hemoccult: Not documented    Colonoscopy: Location:  [facility].    (04/06/2004)  Other Screening   Pap smear: NEGATIVE FOR INTRAEPITHELIAL LESIONS OR MALIGNANCY.  (04/18/2009)    Mammogram: further imaging of right breast  (03/30/2010)    DXA bone density scan: Not documented   Smoking status: quit > 6 months  (04/19/2010)  Lipids   Total Cholesterol: 232  (04/05/2009)   LDL: 88  (08/17/2007)   LDL Direct: 141.2  (04/05/2009)   HDL: 89.20  (04/05/2009)   Triglycerides: 78.0  (04/05/2009)    SGOT (AST): 24  (04/05/2009)   SGPT (ALT): 17  (04/05/2009)   Alkaline phosphatase: 50  (04/05/2009)   Total bilirubin: 0.7  (04/05/2009)  Self-Management Support :    Lipid self-management support: Not documented

## 2010-07-05 NOTE — Progress Notes (Signed)
Summary: refill  Phone Note Refill Request Message from:  Fax from Pharmacy on May 18, 2010 5:25 PM  Refills Requested: Medication #1:  WELLBUTRIN SR 150 MG TB12 1 tab @ bedtime Initial call taken by: Kern Reap CMA (AAMA),  May 18, 2010 5:25 PM    Prescriptions: WELLBUTRIN SR 150 MG TB12 (BUPROPION HCL) 1 tab @ bedtime  #100 x 3   Entered by:   Kern Reap CMA (AAMA)   Authorized by:   Roderick Pee MD   Signed by:   Kern Reap CMA (AAMA) on 05/18/2010   Method used:   Electronically to        [facility] [address] #[id number]* (retail)       [address]       [place], [place]  [address]       Ph: [phone]       Fax: [phone]   RxID:   [id number]

## 2010-07-05 NOTE — Progress Notes (Signed)
Summary: refill request  Phone Note Refill Request Message from:  Fax from Pharmacy on June 22, 2010 5:46 PM  Refills Requested: Medication #1:  PRAMIPEXOLE DIHYDROCHLORIDE 0.25 MG TABS take one tab two times a day Initial call taken by: Kern Reap CMA Duncan Dull),  June 22, 2010 5:46 PM    Prescriptions: PRAMIPEXOLE DIHYDROCHLORIDE 0.25 MG TABS (PRAMIPEXOLE DIHYDROCHLORIDE) take one tab two times a day  #200 x 3   Entered by:   Kern Reap CMA (AAMA)   Authorized by:   Roderick Pee MD   Signed by:   Kern Reap CMA (AAMA) on 06/22/2010   Method used:   Electronically to        [facility] [address] #[id number]* (retail)       [address]       [place], [place]  [address]       Ph: [phone]       Fax: [phone]   RxID:   [id number]

## 2010-07-05 NOTE — Progress Notes (Signed)
Summary: doc please call  Call back at Home Phone [phone]   Caller: Patient Call For: Roderick Pee MD Summary of Call: Pt would like doc to call her today please  Initial call taken by: Heron Sabins,  June 12, 2010 9:54 AM    New/Updated Medications: ATIVAN 1 MG TABS (LORAZEPAM) Take 1 tablet by mouth three times a day as needed Prescriptions: ATIVAN 1 MG TABS (LORAZEPAM) Take 1 tablet by mouth three times a day as needed  #100 x 1   Entered and Authorized by:   Roderick Pee MD   Signed by:   Roderick Pee MD on 06/12/2010   Method used:   Printed then faxed to ...       [facility] [address] #[id number]* (retail)       [address]       [place], [place]  [address]       Ph: [phone]       Fax: [phone]   RxID:   [id number]

## 2010-07-19 ENCOUNTER — Other Ambulatory Visit: Payer: BC Managed Care – PPO | Admitting: Family Medicine

## 2010-07-19 DIAGNOSIS — Z0000 Encounter for general adult medical examination without abnormal findings: Secondary | ICD-10-CM

## 2010-07-19 DIAGNOSIS — E785 Hyperlipidemia, unspecified: Secondary | ICD-10-CM

## 2010-07-19 LAB — BASIC METABOLIC PANEL
BUN: 14 mg/dL (ref 6–23)
CO2: 27 mEq/L (ref 19–32)
GFR: 70.39 mL/min (ref >60.00)
Glucose, Bld: 86 mg/dL (ref 70–99)
Potassium: 4.5 mEq/L (ref 3.5–5.1)

## 2010-07-19 LAB — CBC WITH DIFFERENTIAL/PLATELET
Basophils Absolute: 0 10*3/uL (ref 0.0–0.1)
Eosinophils Absolute: 0.1 10*3/uL (ref 0.0–0.7)
HCT: 35.1 % — ABNORMAL LOW (ref 36.0–46.0)
Hemoglobin: 11.8 g/dL — ABNORMAL LOW (ref 12.0–15.0)
Lymphs Abs: 1.6 10*3/uL (ref 0.7–4.0)
MCHC: 33.7 g/dL (ref 30.0–36.0)
MCV: 83.6 fl (ref 78.0–100.0)
Monocytes Absolute: 0.5 10*3/uL (ref 0.1–1.0)
Monocytes Relative: 11 % (ref 3.0–12.0)
Neutro Abs: 2.5 10*3/uL (ref 1.4–7.7)
Platelets: 352 10*3/uL (ref 150.0–400.0)
RDW: 16.5 % — ABNORMAL HIGH (ref 11.5–14.6)

## 2010-07-19 LAB — POCT URINALYSIS DIPSTICK
Blood, UA: NEGATIVE
Ketones, UA: NEGATIVE
Protein, UA: NEGATIVE
Spec Grav, UA: 1.02
Urobilinogen, UA: 0.2

## 2010-07-19 LAB — LIPID PANEL
Cholesterol: 251 mg/dL — ABNORMAL HIGH (ref 0–200)
HDL: 95.5 mg/dL (ref >39.00)
Total CHOL/HDL Ratio: 3
Triglycerides: 97 mg/dL (ref 0.0–149.0)
VLDL: 19.4 mg/dL (ref 0.0–40.0)

## 2010-07-19 LAB — LDL CHOLESTEROL, DIRECT: Direct LDL: 142.4 mg/dL

## 2010-07-19 LAB — HEPATIC FUNCTION PANEL
Albumin: 4.1 g/dL (ref 3.5–5.2)
Total Bilirubin: 0.5 mg/dL (ref 0.3–1.2)

## 2010-07-26 ENCOUNTER — Ambulatory Visit (INDEPENDENT_AMBULATORY_CARE_PROVIDER_SITE_OTHER): Payer: BC Managed Care – PPO | Admitting: Family Medicine

## 2010-07-26 ENCOUNTER — Other Ambulatory Visit (HOSPITAL_COMMUNITY)
Admission: RE | Admit: 2010-07-26 | Discharge: 2010-07-26 | Disposition: A | Discharge status: Home / Self Care | Payer: Medicare Other | Source: Ambulatory Visit | Attending: Family Medicine | Admitting: Family Medicine

## 2010-07-26 ENCOUNTER — Encounter: Payer: Self-pay | Admitting: Family Medicine

## 2010-07-26 DIAGNOSIS — E785 Hyperlipidemia, unspecified: Secondary | ICD-10-CM

## 2010-07-26 DIAGNOSIS — Z01419 Encounter for gynecological examination (general) (routine) without abnormal findings: Secondary | ICD-10-CM | POA: Insufficient documentation

## 2010-07-26 DIAGNOSIS — Z0000 Encounter for general adult medical examination without abnormal findings: Secondary | ICD-10-CM

## 2010-07-26 DIAGNOSIS — G2581 Restless legs syndrome: Secondary | ICD-10-CM

## 2010-07-26 DIAGNOSIS — K219 Gastro-esophageal reflux disease without esophagitis: Secondary | ICD-10-CM

## 2010-07-26 DIAGNOSIS — F329 Major depressive disorder, single episode, unspecified: Secondary | ICD-10-CM

## 2010-07-26 DIAGNOSIS — J45909 Unspecified asthma, uncomplicated: Secondary | ICD-10-CM

## 2010-07-26 MED ORDER — PRAMIPEXOLE DIHYDROCHLORIDE 0.25 MG PO TABS: 0.2500 mg | ORAL_TABLET | Freq: Two times a day (BID) | ORAL | Status: DC

## 2010-07-26 MED ORDER — BUPROPION HCL ER (XL) 300 MG PO TB24: 300.0000 mg | ORAL_TABLET | ORAL | Status: DC

## 2010-07-26 MED ORDER — OMEPRAZOLE 40 MG PO CPDR: 40.0000 mg | DELAYED_RELEASE_CAPSULE | Freq: Every day | ORAL | Status: DC

## 2010-07-26 MED ORDER — BECLOMETHASONE DIPROPIONATE 40 MCG/ACT IN AERS: 2.0000 | INHALATION_SPRAY | Freq: Two times a day (BID) | RESPIRATORY_TRACT | Status: DC

## 2010-07-26 MED ORDER — LORAZEPAM 1 MG PO TABS: 1.0000 mg | ORAL_TABLET | Freq: Three times a day (TID) | ORAL | Status: DC | PRN

## 2010-07-26 NOTE — Patient Instructions (Signed)
Continue your current medications except  change the Wellbutrin to 300 mg daily in the morning.  Return p.r.n.

## 2010-07-26 NOTE — Progress Notes (Signed)
  Subjective:    Patient ID: Felicia Cruz, female    DOB: 04-Sep-1940, 70 y.o.   MRN: [id number]  HPI Felicia Cruz Is a delightful, 70 year old recently widowed,,,,,,,, her husband Felicia Cruz, died of pancreatic cancer a couple weeks ago,,,,, who comes in today for general physical examination  She has a history of depression.  She is currently taking Wellbutrin 150 mg b.i.d. Will switch to 300 daily.  She takes lorazepam 1 mg.  Po nightly p.r.n. For sleep.  She takes Prilosec 40 mg q.a.m. To prevent reflux esophagitis.  She takes Mirapex .25 mg b.i.d. For restless leg syndrome.  She is up on her health maintenance.  Activities.  Recommend she go to the hospice grief counseling program   Review of Systems  Constitutional: Negative.   HENT: Negative.   Eyes: Negative.   Respiratory: Negative.   Cardiovascular: Negative.   Gastrointestinal: Negative.   Genitourinary: Negative.   Musculoskeletal: Negative.   Neurological: Negative.   Hematological: Negative.   Psychiatric/Behavioral: Negative.        Objective:   Physical Exam  Constitutional: She appears well-developed and well-nourished.  HENT:  Head: Normocephalic and atraumatic.  Right Ear: External ear normal.  Left Ear: External ear normal.  Nose: Nose normal.  Mouth/Throat: Oropharynx is clear and moist.  Eyes: EOM are normal. Pupils are equal, round, and reactive to light.  Neck: Normal range of motion. Neck supple. No thyromegaly present.  Cardiovascular: Normal rate, regular rhythm, normal heart sounds and intact distal pulses.  Exam reveals no gallop and no friction rub.   No murmur heard. Pulmonary/Chest: Effort normal and breath sounds normal.  Abdominal: Soft. Bowel sounds are normal. She exhibits no distension and no mass. There is no tenderness. There is no rebound.  Genitourinary: Vagina normal and uterus normal. Guaiac negative stool. No vaginal discharge found.       Bilateral breast exam normal  Musculoskeletal:  Normal range of motion.  Lymphadenopathy:    She has no cervical adenopathy.  Neurological: She is alert. She has normal reflexes. No cranial nerve deficit. She exhibits normal muscle tone. Coordination normal.  Skin: Skin is warm and dry.  Psychiatric: She has a normal mood and affect. Her behavior is normal. Judgment and thought content normal.          Assessment & Plan:  Depression,,,,,,,,, plan change Wellbutrin 300 mg daily encouraged her to begin the hospice grief counseling program.  Reflux esophagitis.  Continue Prilosec 40 mg daily.  Mirapex 25 b.i.d. For restless leg syndrome.  Return in one year for follow-up, sooner if any problems

## 2010-08-16 LAB — POCT I-STAT, CHEM 8
BUN: 4 mg/dL — ABNORMAL LOW (ref 6–23)
Calcium, Ion: 1.19 mmol/L (ref 1.12–1.32)
Creatinine, Ser: 0.5 mg/dL (ref 0.4–1.2)
Glucose, Bld: 97 mg/dL (ref 70–99)
TCO2: 25 mmol/L (ref 0–100)

## 2010-08-16 LAB — URINALYSIS, ROUTINE W REFLEX MICROSCOPIC
Glucose, UA: NEGATIVE mg/dL
Hgb urine dipstick: NEGATIVE
Ketones, ur: NEGATIVE mg/dL
Protein, ur: NEGATIVE mg/dL

## 2010-10-15 ENCOUNTER — Telehealth: Payer: Self-pay | Admitting: Family Medicine

## 2010-10-15 MED ORDER — HYDROCODONE-HOMATROPINE 5-1.5 MG/5ML PO SYRP: 2.5000 mL | ORAL_SOLUTION | Freq: Four times a day (QID) | ORAL | Status: AC | PRN

## 2010-10-15 NOTE — Telephone Encounter (Signed)
Hydromet 8 ounces directions one half to 1 teaspoon t.i.d. P.r.n. Cough.  No refills

## 2010-10-15 NOTE — Telephone Encounter (Signed)
rx called in and patient is aware 

## 2010-10-15 NOTE — Telephone Encounter (Signed)
Pt has a cough, laryngitis, body aches and headaches. Pt does not want ov. Pt req med be called in to [facility] [phone]

## 2010-10-16 NOTE — Op Note (Signed)
Felicia Cruz, Felicia Cruz                ACCOUNT NO.:  [id number]   MEDICAL RECORD NO.:  [id number]          PATIENT TYPE:  AMB   LOCATION:  DAY                          FACILITY:  [facility]   PHYSICIAN:  Valetta Fuller, M.D.  DATE OF BIRTH:  1940/12/04   DATE OF PROCEDURE:  12/17/2006  DATE OF DISCHARGE:  12/17/2006                               OPERATIVE REPORT   PREOPERATIVE DIAGNOSIS:  Left distal ureteral calculus.   POSTOPERATIVE DIAGNOSIS:  Left distal ureteral calculus.   PROCEDURE PERFORMED:  Cystoscopy, left retrograde pyelography, left  ureteroscopy, stone basketing and double-J stent placement.   SURGEON:  Valetta Fuller, M.D.   ANESTHESIA:  General.   INDICATIONS:  Felicia Cruz is a 70 year old female with no previous  urologic history.  She has been having some intermittent left-sided back  discomfort and thought it was initially some chronic musculoskeletal  back issues she has had.  Her pain significantly worsened and became  associated with nausea.  She describes a fairly abrupt onset of more  intense pain that is very reminiscent of renal colic.  She presented  this morning to the [facility] emergency room.  The patient's overall  renal function was normal and urinalysis was actually fairly  unremarkable.  The patient again had been having some discomfort for  about a week but things had really intensified early this morning.  She  underwent a stone protocol CT.  This showed no evidence of renal  calculi.  She did have what appeared to be a 7 x 3 mm stone obstructing  the left ureter at the ureterovesical junction with hydronephrosis and  what appeared to be some perinephric extravasation of urine.  I reviewed  that CT and felt that she probably had actually too small stones and the  ureterovesical junction.  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  I was also told that she did have a bulging disk  but was unable to really appreciate that nor was a official report  available at that time.  The patient was initially sent to our office  while I was in surgery and she was assessed by our nurse practitioner.  I then came to see her later in the afternoon and she was continuing to  have significant discomfort.  I explained her that these stones of this  size are potentially passable but could take anywhere from several days  to several weeks for the stone to pass.  She requested urgent  intervention.  Given the location and size of the stone, we felt  ureteroscopy was the most logical approach.  She accepted that  recommendation and appeared to understand the advantages and  disadvantages and potential complications of that procedure.   TECHNIQUE AND FINDINGS:  The patient was brought into the operating  room.  Because of her chronic  back issues with some radicular pain and  this question of a bulging disk we elected to place her in lithotomy  position with her awake to make sure that this was a comfortable  position and we were not exacerbating any musculoskeletal back issues.  Once that was accomplished she had successful induction of general  anesthesia, was then prepped, draped in the usual manner.  Her urethral  meatus showed a little bit of stenosis and was gently dilated.  Cystoscopy was then performed.  The bladder was unremarkable other than  some edema involving the left hemi trigone and ureteral orifice which  was quite narrow.   Retrograde pyelogram was done with an 8-French cone-tip catheter.  Fluoroscopic guidance was used and interpreted in real time by myself.  The patient had what appeared to be two filling defects in the distal  ureter with moderate obstruction and a dilated ureter up to the renal   pelvis.   A guidewire was then placed in left renal pelvis without difficulty.  Initial attempts to engage ureter were unsuccessful due to the edema and  narrowing.  For that reason we used the inside portion of an access  sheath to provide one-step dilation of the distal ureter.  The ureter  was then easily engaged with the ureteroscope and two stones were  encountered in the distal ureter.  These were both basket extracted  without difficulty.  Because of the need for dilation, we felt it  prudent to leave a stent.  We chose a 6-French 24 cm stent and left the  dangle string attached which was secured to the patient's inner thigh.  The patient appeared to tolerate the procedure well and there were no  obvious complications or difficulties.           ______________________________  Valetta Fuller, M.D.  Electronically Signed     DSG/MEDQ  D:  12/17/2006  T:  12/18/2006  Job:  [id number]

## 2010-10-16 NOTE — Discharge Summary (Signed)
Felicia Cruz, Felicia Cruz                ACCOUNT NO.:  [id number]   MEDICAL RECORD NO.:  [id number]          PATIENT TYPE:  INP   LOCATION:  3027                         FACILITY:  [facility]   PHYSICIAN:  Payton Doughty, M.D.      DATE OF BIRTH:  1941/05/14   DATE OF ADMISSION:  07/15/2007  DATE OF DISCHARGE:  07/19/2007                               DISCHARGE SUMMARY   ADMISSION DIAGNOSIS:  Spondylosis, L3-L4, L4-L5, L5-S1.   DISCHARGE DIAGNOSIS:  Spondylosis, L3-L4, L4-L5, L5-S1.   OPERATIVE PROCEDURE:  L3-L4, L4-L5, and L5-S1 laminectomy, diskectomy,  posterior lumbar interbody fusion, segmental pedicle screw fixation and  posterolateral arthrodesis.   COMPLICATIONS:  None.   DISCHARGE STATUS:  Alive and well.   BODY OF TEXT:  This is a 70 year old, right-handed, white girl whose  history and physical is recounted in the chart. She has had pain for  numerous years and has had increasing pain in her back and down her  legs.  Discography was concordant and positive at 4-5 and 5-1, and she  was admitted for fusion.  Medical history is remarkable for  hypercholesterolemia, leg pain, depression, and restless legs.   ALLERGIES:  PENICILLIN.   General exam is intact, neurologic exam is intact with intractable back  and leg pain.   She was admitted after ascertaining normal laboratory values and  underwent the fusion as noted above.  Postoperatively, she has done  well.  There was a dural tear which repaired primarily.  She spent 36  hours down flat and sat up with that and had no problems.  There was no  headache.  There was no evidence of CSF leak on her incision.  Currently  she is up and about off the PCA.  Foley has been removed.  She is eating  and voiding normally.  She is being discharged home to the care of her  family.  Her followup will be in the [facility] offices in about a week  for suture removal.           ______________________________  Payton Doughty, M.D.     MWR/MEDQ   D:  07/19/2007  T:  07/20/2007  Job:  [id number]

## 2010-10-16 NOTE — Op Note (Signed)
Felicia Cruz, Felicia Cruz                ACCOUNT NO.:  [id number]   MEDICAL RECORD NO.:  [id number]          PATIENT TYPE:  INP   LOCATION:  2899                         FACILITY:  [facility]   PHYSICIAN:  Payton Doughty, M.D.      DATE OF BIRTH:  [birth year]   DATE OF PROCEDURE:  07/15/2007  DATE OF DISCHARGE:                               OPERATIVE REPORT   PREOPERATIVE DIAGNOSIS:  Spondylosis L3-L4, L4-L5, and L5-S1.   POSTOPERATIVE DIAGNOSIS:  Spondylosis L3-L4, L4-L5, and L5-S1.   PROCEDURE:  L3-L4, L4-L5, and L5-S1 laminectomy, discectomy, posterior  lumbar interbody fusion with PEEK cages, segmental pedicle screw  fixation from L3 to S1, and posterolateral arthrodesis L3 to S1.   SURGEON:  Payton Doughty, M.D.   ASSISTANT:  Hewitt Shorts, M.D.   ANESTHESIA:  General endotracheal anesthesia.   PREPARATION:  Betadine scrub with alcohol wipe.   COMPLICATIONS:  None.   BODY OF TEXT:  This is a 70 year old lady with severe lumbar  spondylosis.  She is taken to the operating room, smoothly anesthetized,  and intubated.  She was placed prone on the operating table. Following  shave, prep and drape in the usual sterile fashion, the skin was then  infiltrated with 1% lidocaine with 1:100,000 epinephrine.  The skin was  incised from mid L2 to mid S1. The lamina and transverse processes of  L3, L4, L5, S1 and sacral ala were exposed bilaterally in a  subperiosteal plane.  Intraoperative x-ray confirmed correctness of the  level.  Having confirmed correctness of the level, the pars intra-  articularis lamina and inferior facet of L3, L4, L5, and the superior  facet of L4, L5, and S1 were removed bilaterally using a high speed  drill. Particularly at L4-L5, there was severe spondylosis that was  worse on the left than on the right with severe compression of the canal  and the nerve roots as they exited.  L5-S1 was also fairly severely  affected on the left.  On the right side, when taking the  bone down, the  dura was inadvertently entered.  It was repaired primarily with 6-0  Prolene.  To Valsalva, there was no leak.  Following complete  decompression of the L3, L4, L5, and S1 roots bilaterally, PEEK cages  were placed 8 mm at L5-S1 and L4-L5 and 10 mm at L3-L4.  They had  previously been packed with bone harvested from the facet joints.  Pedicle screws were then placed at L3, L4, L5, and S1, and the  transverse processes were decorticated with the high speed drill.  The  rods were connected to the pedicle screws and capped and the final  tightener applied.  BMP on the extender matrix mixed with the remnants  of the patient's own bone was placed over the transverse processes and  sacral ala as intertransverse fusion.  Final x-ray showed good placement  of cages, rods and screws.  Successive layers of 0 Vicryl, 2-0 Vicryl  and 3-0 nylon were used to close.  The patient returned to the recovery  room in good condition.  ______________________________  Payton Doughty, M.D.     MWR/MEDQ  D:  07/15/2007  T:  07/16/2007  Job:  [id number]

## 2010-10-16 NOTE — H&P (Signed)
Felicia Cruz, Felicia Cruz                ACCOUNT NO.:  [id number]   MEDICAL RECORD NO.:  [id number]          PATIENT TYPE:  INP   LOCATION:  3027                         FACILITY:  [facility]   PHYSICIAN:  Payton Doughty, M.D.      DATE OF BIRTH:  12/25/1940   DATE OF ADMISSION:  07/15/2007  DATE OF DISCHARGE:                              HISTORY & PHYSICAL   ADMISSION DIAGNOSIS:  Lumbar spondylosis.   This is a 70 year old right-handed white lady, who has had the back pain  for numerous years.  She has visit with Dr. __________ , had some  steroids which were not very helpful.  I visited with her and obtained a  diskography which was concordant and positive at 3-4, 4-5, and a 5-1 can  be entered, and she is now admitted for fusion at 3-4, 4-5, and 5-1.   MEDICAL HISTORY:  Remarkable for hypercholesterolemia, leg pain,  depression and restless legs.   ALLERGIES:  SHE IS ALLERGIC TO PENICILLIN.   MEDICATIONS:  Are Wellbutrin, Lipitor, Mirapex, calcium, fish oils, and  Advil.   SOCIAL HISTORY:  She does not smoke or drink, and she is retired.   FAMILY HISTORY:  Mom died of MI.  Dad is deceased of cancer.   REVIEW OF SYSTEMS:  Remarkable for weight loss which has been  volitional, wearing glasses.  HEENT:  Normal limits.  She has a  reasonable range of motion in neck.  CHEST:  Clear.  CARDIAC:  Exam is  regular rate and rhythm.  ABDOMEN:  Nontender with no  hepatosplenomegaly.  EXTREMITIES:  Without clubbing or cyanosis.  GU:  Exam deferred.  Peripheral pulses are good.  NEUROLOGICALLY:  She is  awake, alert and oriented.  Her cranial nerves are intact.  Motor exam  demonstrates 5/5 strength throughout the upper and lower extremities.  There is no recurrent sensory deficit.  Reflexes are intact.   Diskography been reviewed above.   CLINICAL IMPRESSION:  Severe lumbar spondylosis.  The plan is for a  lumbar fusion at 3-4, 4-5 and 5-1.  The risks and benefits have been  discussed with her,  and she wishes to proceed.           ______________________________  Payton Doughty, M.D.     MWR/MEDQ  D:  07/15/2007  T:  07/16/2007  Job:  [id number]

## 2010-12-25 ENCOUNTER — Ambulatory Visit (INDEPENDENT_AMBULATORY_CARE_PROVIDER_SITE_OTHER)
Admission: RE | Admit: 2010-12-25 | Discharge: 2010-12-25 | Disposition: A | Discharge status: Home / Self Care | Payer: Medicare Other | Source: Ambulatory Visit | Attending: Family Medicine | Admitting: Family Medicine

## 2010-12-25 ENCOUNTER — Other Ambulatory Visit: Payer: Self-pay | Admitting: Family Medicine

## 2010-12-25 DIAGNOSIS — R058 Other specified cough: Secondary | ICD-10-CM

## 2010-12-25 DIAGNOSIS — R05 Cough: Secondary | ICD-10-CM

## 2010-12-25 DIAGNOSIS — R053 Chronic cough: Secondary | ICD-10-CM

## 2010-12-25 DIAGNOSIS — R059 Cough, unspecified: Secondary | ICD-10-CM

## 2010-12-26 ENCOUNTER — Encounter: Payer: Self-pay | Admitting: Family Medicine

## 2010-12-26 ENCOUNTER — Ambulatory Visit (INDEPENDENT_AMBULATORY_CARE_PROVIDER_SITE_OTHER): Payer: Medicare Other | Admitting: Family Medicine

## 2010-12-26 VITALS — BP 120/80 | Temp 98.1°F | Wt 166.0 lb

## 2010-12-26 DIAGNOSIS — J45909 Unspecified asthma, uncomplicated: Secondary | ICD-10-CM

## 2010-12-26 MED ORDER — PREDNISONE 20 MG PO TABS: ORAL_TABLET | ORAL | Status: DC

## 2010-12-26 MED ORDER — HYDROCODONE-HOMATROPINE 5-1.5 MG/5ML PO SYRP: ORAL_SOLUTION | ORAL | Status: DC

## 2010-12-26 NOTE — Progress Notes (Signed)
  Subjective:    Patient ID: Felicia Cruz, female    DOB: 1940/08/10, 70 y.o.   MRN: [id number]  HPI Catalina Gravel is a 70 year old recently widowed, nonsmoking female, who comes in today with a cough for two months.  She states in May she developed a viral syndrome with headache, congestion voice loss and cough.  Everything are better, but the cough would go away.  She's tried over-the-counter cough medicines and Hydromet, however, the cough persists.  She does have a history of asthma and is taking her inhaled steroid two puffs twice daily.  No fever no sputum production.  Chest x-ray yesterday, normal   Review of Systems    General and pulmonary review of systems otherwise negative Objective:   Physical Exam    Well-developed well-nourished, female, in no acute distress.  Examination of lungs show symmetrical.  Breath sounds with late forced expiratory mild wheezing.  Cardiac exam normal    Assessment & Plan:  Reactive airway disease.  Plan prednisone burst and taper

## 2010-12-26 NOTE — Patient Instructions (Signed)
Take two tablets of the prednisone daily and to your cough diminishes and then taper as directed.  Drink lots of water.  Hydromet one half to 1 teaspoon at bedtime.  For nighttime cough.  Return p.r.n.

## 2011-01-09 ENCOUNTER — Encounter: Payer: Self-pay | Admitting: Family Medicine

## 2011-01-09 ENCOUNTER — Ambulatory Visit (INDEPENDENT_AMBULATORY_CARE_PROVIDER_SITE_OTHER): Payer: Medicare Other | Admitting: Family Medicine

## 2011-01-09 ENCOUNTER — Ambulatory Visit: Payer: Medicare Other | Admitting: Family Medicine

## 2011-01-09 DIAGNOSIS — J45909 Unspecified asthma, uncomplicated: Secondary | ICD-10-CM

## 2011-01-09 DIAGNOSIS — K219 Gastro-esophageal reflux disease without esophagitis: Secondary | ICD-10-CM

## 2011-01-09 DIAGNOSIS — R05 Cough: Secondary | ICD-10-CM | POA: Insufficient documentation

## 2011-01-09 MED ORDER — ESOMEPRAZOLE MAGNESIUM 40 MG PO CPDR: DELAYED_RELEASE_CAPSULE | ORAL | Status: DC

## 2011-01-09 MED ORDER — HYDROCODONE-HOMATROPINE 5-1.5 MG/5ML PO SYRP: ORAL_SOLUTION | ORAL | Status: DC

## 2011-01-09 NOTE — Patient Instructions (Signed)
Drink lots of water.  Nothing to eat or drink for 3 hours prior to bedtime and no caffeine or peppermint.  Stop the Prilosec.  Begin Nexium 40 mg b.i.d.  Continue the Qvar inhaler two puffs twice daily.  Hydromet one half to 1 teaspoon 4 times daily as a cough suppressant.  I will put in a request for a consult with Dr. Delford Field ASAP

## 2011-01-09 NOTE — Progress Notes (Signed)
  Subjective:    Patient ID: Felicia Cruz, female    DOB: 12/07/1940, 70 y.o.   MRN: [id number]  HPICarol is a 70 year old recently widowed female nonsmoker, who comes in today for reevaluation of her cough.  Her cough X. He started about 5 months ago.  We saw her about two weeks ago, and examination at that time was negative, except for some expiratory wheezing bilaterally.  We start on prednisone 40 mg a day and after about a week to 10 days.  She states she does act out in a rash which is extremely pruritic.  She stopped the prednisone and the next day the rash went away.  She also has a history of reflux esophagitis, and she is taking Prilosec 40 mg b.i.d.  She's also had a history of asthma.  She is on her inhaled steroid.  The Qvar 42 puffs b.i.d.  No fever no sputum production.    Review of Systems    General pulmonary, GI, review of systems otherwise negative.  Recent chest x-ray normal Objective:   Physical Exam  Well-developed well-nourished, female, in no acute distress.  Examination of the lung show symmetrical.  Breath sounds again, bilateral wheezing.      Assessment & Plan:  Chronic cough, question secondary to reactive airway disease versus reflux versus a combination of both  Rash secondary to prednisone.  History of asthma.  History of reflux.  Plan drink lots of water.  Continue current medications add cough suppressant 4 times daily.  Change the Prilosec to Nexium also consult with Dr. Paul Dykes ASAP

## 2011-01-10 ENCOUNTER — Ambulatory Visit (INDEPENDENT_AMBULATORY_CARE_PROVIDER_SITE_OTHER): Payer: Medicare Other | Admitting: Adult Health

## 2011-01-10 ENCOUNTER — Encounter: Payer: Self-pay | Admitting: Adult Health

## 2011-01-10 VITALS — BP 130/68 | HR 82 | Temp 97.0°F | Ht 66.0 in | Wt 170.2 lb

## 2011-01-10 DIAGNOSIS — J45909 Unspecified asthma, uncomplicated: Secondary | ICD-10-CM

## 2011-01-10 DIAGNOSIS — R059 Cough, unspecified: Secondary | ICD-10-CM

## 2011-01-10 DIAGNOSIS — R05 Cough: Secondary | ICD-10-CM

## 2011-01-10 MED ORDER — METHYLPREDNISOLONE ACETATE 80 MG/ML IJ SUSP
120.0000 mg | Freq: Once | INTRAMUSCULAR | Status: AC
  Administered 2011-01-10: 120 mg via INTRAMUSCULAR

## 2011-01-10 MED ORDER — BENZONATATE 200 MG PO CAPS: 200.0000 mg | ORAL_CAPSULE | Freq: Three times a day (TID) | ORAL | Status: DC | PRN

## 2011-01-10 NOTE — Assessment & Plan Note (Addendum)
Flare with upper airway inflammation.  cxr reviewed w/ no acute process.  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   Plan  Depo medrol injection 120mg  x 1  Delsym 2 tsp Twice daily  (for cough) Tessalon Three times a day  (for cough) Hydromet 1-2 tsp every 4-6 hr As needed  Cough -this can make you sleepy Add Pepcid 20mg  At bedtime   Add Zyrtec 10mg  daily  Add Chlorpheneramine (Chlor tabs ) 4mg   2 At bedtime   AVOID all mints.  Use sugarless candy , water to help avoid cough or throat clearing.  GERD diet  Cont on Nexium 40mg  daily before meals.  Hold all oil bases vitamins for now.  follow up Dr. Delford Field  In 2 weeks and As needed   Please contact office for sooner follow up if symptoms do not improve or worsen or seek emergency care

## 2011-01-10 NOTE — Progress Notes (Signed)
  Subjective:    Patient ID: Felicia Cruz, female    DOB: 03-22-1941, 70 y.o.   MRN: [id number]  HPI 70  years old female who presents with cough. Methacholine challenge Positve. GERD ppts cough. Lower airway inflammation with positive response to ICS and PPI Rx.   November 16, 2009 2:30 PM  On qvar the cough has resolved. The pt is doing well. There are no new issues. She remains on PPI rx.  There is no GERD symptoms.    April 19, 2010  No real cough, the qvar helps. No real heartburn. No new issues >>no changes   01/10/2011 Acute OV  Pt presents for a work in visit for persistent cough for 3 months. Complains of asthma flare with prod cough with white mucus x76months.  Says she developed a cold with sore throat 3 months ago, got some better but cough never went away. Cough has been progressively getting worse. Seen by PCP 2 weeks ago, tx with steroid taper. CXR with no acute process. No better and was seen by PCP yesterday given hydromet. She  Complains of tickle in throat, coughing fits (that are causing urinary incontinence).  No vomitting , dysphagia, chest pain, no edema, or reflux. No sinus pain or pressure. Some wheezing on/off. Did not think prednisone helped, caused her to feel itchy.     Review of Systems Constitutional:   No  weight loss, night sweats,  Fevers, chills  HEENT:   No headaches,  Difficulty swallowing,  Tooth/dental problems,                 No sneezing, itching, ear ache, nasal congestion, post nasal drip,   CV:  No chest pain,  Orthopnea, PND, swelling in lower extremities, anasarca, dizziness, palpitations, syncope.   GI  No heartburn, indigestion, abdominal pain, nausea, vomiting, diarrhea, change in bowel habits, loss of appetite, bloody stools.   Resp: No shortness of breath with exertion or at rest.  No coughing up of blood.     No chest wall deformity  Skin: no rash or lesions.  GU: no dysuria, change in color of urine, no urgency or frequency.  No flank  pain, no hematuria   MS:  No joint pain or swelling.  No decreased range of motion.  No back pain.  Psych:  No change in mood or affect. No depression or anxiety.  No memory loss.          Objective:   Physical Exam GEN: A/Ox3; pleasant , NAD,  Frequent cough during exam.   HEENT:  [place]/AT,  EACs-clear, TMs-wnl, NOSE-clear, THROAT-clear, no lesions, no postnasal drip or exudate noted.   NECK:  Supple w/ fair ROM; no JVD; normal carotid impulses w/o bruits; no thyromegaly or nodules palpated; no lymphadenopathy.  RESP  Coarse BS w/ no wheezing.no accessory muscle use, no dullness to percussion  CARD:  RRR, no m/r/g  , no peripheral edema, pulses intact, no cyanosis or clubbing.  GI:   Soft & nt; nml bowel sounds; no organomegaly or masses detected.  Musco: Warm bil, no deformities or joint swelling noted.   Neuro: alert, no focal deficits noted.    Skin: Warm, no lesions or rashes         Assessment & Plan:

## 2011-01-10 NOTE — Patient Instructions (Signed)
Delsym 2 tsp Twice daily  (for cough) Tessalon Three times a day  (for cough) Hydromet 1-2 tsp every 4-6 hr As needed  Cough -this can make you sleepy Add Pepcid 20mg  At bedtime   Add Zyrtec 10mg  daily  Add Chlorpheneramine (Chlor tabs ) 4mg   2 At bedtime   AVOID all mints.  Use sugarless candy , water to help avoid cough or throat clearing.  GERD diet  Cont on Nexium 40mg  daily before meals.  Hold all oil bases vitamins for now.  follow up Dr. Delford Field  In 2 weeks and As needed   Please contact office for sooner follow up if symptoms do not improve or worsen or seek emergency care

## 2011-01-16 ENCOUNTER — Other Ambulatory Visit: Payer: Self-pay | Admitting: *Deleted

## 2011-01-16 DIAGNOSIS — F329 Major depressive disorder, single episode, unspecified: Secondary | ICD-10-CM

## 2011-01-16 MED ORDER — LORAZEPAM 1 MG PO TABS: 1.0000 mg | ORAL_TABLET | Freq: Three times a day (TID) | ORAL | Status: DC | PRN

## 2011-01-28 ENCOUNTER — Ambulatory Visit (INDEPENDENT_AMBULATORY_CARE_PROVIDER_SITE_OTHER): Payer: Medicare Other | Admitting: Critical Care Medicine

## 2011-01-28 ENCOUNTER — Telehealth: Payer: Self-pay | Admitting: Internal Medicine

## 2011-01-28 ENCOUNTER — Encounter: Payer: Self-pay | Admitting: Critical Care Medicine

## 2011-01-28 DIAGNOSIS — J45909 Unspecified asthma, uncomplicated: Secondary | ICD-10-CM

## 2011-01-28 DIAGNOSIS — R05 Cough: Secondary | ICD-10-CM

## 2011-01-28 DIAGNOSIS — K219 Gastro-esophageal reflux disease without esophagitis: Secondary | ICD-10-CM

## 2011-01-28 MED ORDER — HYDROCOD POLST-CPM POLST ER 10-8 MG PO CP12: 1.0000 | ORAL_CAPSULE | Freq: Two times a day (BID) | ORAL | Status: DC | PRN

## 2011-01-28 MED ORDER — BECLOMETHASONE DIPROPIONATE 40 MCG/ACT IN AERS: INHALATION_SPRAY | RESPIRATORY_TRACT | Status: DC

## 2011-01-28 NOTE — Assessment & Plan Note (Signed)
History of reactive airway disease, now more cyclical cough failed high dose PPI plus H2 blocker.  Failed antihistamine and cyclical cough protocol. Now clear hx of dysphagia and regurgitation of food.  ?esophageal dysmotility vs stricture. Pt sees Lina Sar for colon care   Plan Referral to GI dr Juanda Chance for eval ?EGD of dysphagia with cough syndrome Stop qvar, may be aggravating cough Stop zyrtec/chlorpheniramine Stop pepcid Cont PPI BID Cont reflux diet Stop delsym Use tessalon around the clock 100- 200mg  tid Stop hydromet and try Tussicaps bid prn for severe cough paroxysms Rov 6 weeks for f/u

## 2011-01-28 NOTE — Progress Notes (Signed)
Subjective:    Patient ID: Felicia Cruz, female    DOB: [birth year], 70 y.o.   MRN: [id number]  Cough This is a chronic problem. The current episode started more than 1 month ago. The problem has been unchanged. The cough is non-productive. Associated symptoms include a sore throat. Pertinent negatives include no chest pain, chills, ear congestion, ear pain, fever, headaches, heartburn, hemoptysis, nasal congestion, postnasal drip, rash, rhinorrhea, shortness of breath, weight loss or wheezing. Associated symptoms comments: Throat clearing noted Does have dysphagia, food is stuck No GI eval in past for dysphagia. Colonoscopy per Juanda Chance . The symptoms are aggravated by fumes, dust and lying down (coughs after meals,  ).   70  years old female who presents with cough. Methacholine challenge Positve. GERD ppts cough. Lower airway inflammation with positive response to ICS and PPI Rx.   November 16, 2009 2:30 PM  On qvar the cough has resolved. The pt is doing well. There are no new issues. She remains on PPI rx.  There is no GERD symptoms.    April 19, 2010  No real cough, the qvar helps. No real heartburn. No new issues >>no changes   01/10/11 Acute OV  Pt presents for a work in visit for persistent cough for 3 months. Complains of asthma flare with prod cough with white mucus x27months.  Says she developed a cold with sore throat 3 months ago, got some better but cough never went away. Cough has been progressively getting worse. Seen by PCP 2 weeks ago, tx with steroid taper. CXR with no acute process. No better and was seen by PCP yesterday given hydromet. She  Complains of tickle in throat, coughing fits (that are causing urinary incontinence).  No vomitting , dysphagia, chest pain, no edema, or reflux. No sinus pain or pressure. Some wheezing on/off. Did not think prednisone helped, caused her to feel itchy.   01/28/2011 Seen by NP on 8/9 for chronic cough. NP assess and Rx as follows at that  OV: cxr reviewed w/ no acute process.  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    Plan  Depo medrol injection 120mg  x 1  Delsym 2 tsp Twice daily (for cough)  Tessalon Three times a day (for cough)  Hydromet 1-2 tsp every 4-6 hr As needed Cough -this can make you sleepy  Add Pepcid 20mg  At bedtime  Add Zyrtec 10mg  daily  Add Chlorpheneramine (Chlor tabs ) 4mg  2 At bedtime  AVOID all mints.  Use sugarless candy , water to help avoid cough or throat clearing.  GERD diet  Cont on Nexium 40mg  daily before meals.  Hold all oil bases vitamins for now.   May laryngitis :  Went away but cough never went away. Cough now unchanged.   Pt is hoarse as well  Review of Systems  Constitutional: Negative for fever, chills and weight loss.  HENT: Positive for sore throat. Negative for ear pain, rhinorrhea and postnasal drip.   Respiratory: Positive for cough. Negative for hemoptysis, shortness of breath and wheezing.   Cardiovascular: Negative for chest pain.  Gastrointestinal: Negative for heartburn.  Skin: Negative for rash.  Neurological: Negative for headaches.   Constitutional:   No  weight loss, night sweats,  Fevers, chills  HEENT:   No headaches,  Difficulty swallowing,  Tooth/dental problems,  No sneezing, itching, ear ache, nasal congestion, post nasal drip,   CV:  No chest pain,  Orthopnea, PND, swelling in lower extremities, anasarca, dizziness, palpitations, syncope.   GI  No heartburn, indigestion, abdominal pain, nausea, vomiting, diarrhea, change in bowel habits, loss of appetite, bloody stools.  +dysphagia and regurgitation  Resp: No shortness of breath with exertion or at rest.  No coughing up of blood.     No chest wall deformity  Skin: no rash or lesions.  GU: no dysuria, change in color of urine, no urgency or frequency.  No flank pain, no hematuria   MS:  No joint pain or  swelling.  No decreased range of motion.  No back pain.  Psych:  No change in mood or affect. No depression or anxiety.  No memory loss.          Objective:   Physical Exam  Filed Vitals:   01/28/11 0918  BP: 128/76  Pulse: 93  Temp: 98.1 F (36.7 C)  TempSrc: Oral  Height: 5\' 6"  (1.676 m)  Weight: 162 lb (73.483 kg)  SpO2: 96%      GEN: A/Ox3; pleasant , NAD,  Frequent cough during exam.   HEENT:  [place]/AT,  EACs-clear, TMs-wnl, NOSE-clear, THROAT-clear, no lesions, no postnasal drip or exudate noted.   NECK:  Supple w/ fair ROM; no JVD; normal carotid impulses w/o bruits; no thyromegaly or nodules palpated; no lymphadenopathy.  RESP  Clear  w/ no wheezing.no accessory muscle use, no dullness to percussion  CARD:  RRR, no m/r/g  , no peripheral edema, pulses intact, no cyanosis or clubbing.  GI:   Soft & nt; nml bowel sounds; no organomegaly or masses detected.  Musco: Warm bil, no deformities or joint swelling noted.   Neuro: alert, no focal deficits noted.    Skin: Warm, no lesions or rashes         Assessment & Plan:   Chronic cough History of reactive airway disease, now more cyclical cough failed high dose PPI plus H2 blocker.  Failed antihistamine and cyclical cough protocol. Now clear hx of dysphagia and regurgitation of food.  ?esophageal dysmotility vs stricture. Pt sees Lina Sar for colon care   Plan Referral to GI dr Juanda Chance for eval ?EGD of dysphagia with cough syndrome Stop qvar, may be aggravating cough Stop zyrtec/chlorpheniramine Stop pepcid Cont PPI BID Cont reflux diet Stop delsym Use tessalon around the clock 100- 200mg  tid Stop hydromet and try Tussicaps bid prn for severe cough paroxysms Rov 6 weeks for f/u    Updated Medication List Outpatient Encounter Prescriptions as of 01/28/2011  Medication Sig Dispense Refill  . aspirin 81 MG EC tablet Take 81 mg by mouth daily.        . beclomethasone (QVAR) 40 MCG/ACT inhaler HOLD  for now  3 Inhaler  4  . benzonatate (TESSALON) 100 MG capsule Take 100 mg by mouth 2 (two) times daily.        Marland Kitchen buPROPion (WELLBUTRIN XL) 300 MG 24 hr tablet Take 1 tablet (300 mg total) by mouth every morning.  100 tablet  3  . calcium carbonate (OS-CAL) 600 MG TABS Take 600 mg by mouth daily.        . Cholecalciferol (VITAMIN D) 2000 UNITS CAPS Take by mouth daily.        Marland Kitchen esomeprazole (NEXIUM) 40 MG capsule One p.o. B.i.d.  60 capsule  4  . LORazepam (ATIVAN) 1 MG tablet Take 1 tablet (1  mg total) by mouth 3 (three) times daily as needed.  100 tablet  1  . pramipexole (MIRAPEX) 0.25 MG tablet Take 1 tablet (0.25 mg total) by mouth 2 (two) times daily.  200 tablet  3  . Red Yeast Rice 600 MG CAPS Take by mouth daily.        Marland Kitchen DISCONTD: beclomethasone (QVAR) 40 MCG/ACT inhaler Inhale 2 puffs into the lungs 2 (two) times daily.  3 Inhaler  4  . DISCONTD: cetirizine (ZYRTEC) 10 MG tablet Take 10 mg by mouth daily.        Marland Kitchen DISCONTD: chlorpheniramine (CHLOR-TRIMETON) 4 MG tablet Take 8 mg by mouth at bedtime.        Marland Kitchen DISCONTD: famotidine (PEPCID) 20 MG tablet Take 20 mg by mouth at bedtime.        Marland Kitchen Hydrocod Polst-Chlorphen Polst (TUSSICAPS) 10-8 MG CP12 Take 1 capsule by mouth 2 (two) times daily as needed.  30 each  0  . DISCONTD: HYDROcodone-homatropine (HYDROMET) 5-1.5 MG/5ML syrup One half to 1 teaspoon at bedtime p.r.n. cough  240 mL  1

## 2011-01-28 NOTE — Telephone Encounter (Signed)
Scheduled patient on 02/05/11 at 8:30 AM with Dr. Juanda Chance. Bjorn Loser will check with Dr. Delford Field and if this does not work she will call me back.

## 2011-01-28 NOTE — Patient Instructions (Signed)
Stop Qvar Stop pepcid, hydromet, chlorpheniramine, zyrtec Continue to follow reflux diet Stay on Nexium A referral to Dr Juanda Chance will be made Use Tussicaps one twice daily for severe cough USe tessalon perles 1-2 three times a day around the clock Return [place] 6 -8 weeks for follow up

## 2011-01-29 ENCOUNTER — Telehealth: Payer: Self-pay | Admitting: *Deleted

## 2011-01-29 NOTE — Telephone Encounter (Signed)
We will see her. May need Ba esophagram before EGD, if The wait for OV is too long go ahead and schedule Barium esophagram " r/o dysmotility vs stricture" Dora B ----- Message ----- From: Shan Levans, MD Sent: 01/28/2011 10:04 AM To: Hart Carwin, MD, Evette Georges   Dr. Juanda Chance, Patient is scheduled to see you on 02/18/11. I offered her an appointment on 02/05/11 but according to Dr. Lynelle Doctor office the patient is on vacation for 2 weeks and cannot come any earlier.

## 2011-02-02 ENCOUNTER — Other Ambulatory Visit: Payer: Self-pay | Admitting: Adult Health

## 2011-02-05 ENCOUNTER — Ambulatory Visit: Payer: Medicare Other | Admitting: Internal Medicine

## 2011-02-18 ENCOUNTER — Ambulatory Visit: Payer: Medicare Other | Admitting: Internal Medicine

## 2011-02-22 LAB — COMPREHENSIVE METABOLIC PANEL
AST: 28
BUN: 5 — ABNORMAL LOW
CO2: 27
Chloride: 104
Creatinine, Ser: 0.68
GFR calc Af Amer: >60
GFR calc non Af Amer: >60
Glucose, Bld: 98
Total Bilirubin: 0.5

## 2011-02-22 LAB — DIFFERENTIAL
Basophils Relative: 1
Eosinophils Absolute: 0.1
Eosinophils Relative: 2
Lymphs Abs: 2.2
Monocytes Absolute: 0.4
Monocytes Relative: 7

## 2011-02-22 LAB — URINALYSIS, ROUTINE W REFLEX MICROSCOPIC
Bilirubin Urine: NEGATIVE
Hgb urine dipstick: NEGATIVE
Specific Gravity, Urine: 1.011
Urobilinogen, UA: 0.2
pH: 6.5

## 2011-02-22 LAB — CBC
HCT: 39.5
Hemoglobin: 13.4
MCV: 91.4
RBC: 4.32
WBC: 6

## 2011-02-22 LAB — TYPE AND SCREEN
ABO/RH(D): A POS
Antibody Screen: NEGATIVE

## 2011-02-22 LAB — PROTIME-INR: INR: 0.9

## 2011-02-28 ENCOUNTER — Encounter: Payer: Self-pay | Admitting: Critical Care Medicine

## 2011-02-28 ENCOUNTER — Ambulatory Visit (INDEPENDENT_AMBULATORY_CARE_PROVIDER_SITE_OTHER): Payer: Medicare Other | Admitting: Critical Care Medicine

## 2011-02-28 DIAGNOSIS — R05 Cough: Secondary | ICD-10-CM

## 2011-02-28 NOTE — Assessment & Plan Note (Signed)
History of reactive airway disease, now more cyclical cough failed high dose PPI plus H2 blocker.  Failed antihistamine and cyclical cough protocol. Now clear hx of dysphagia and regurgitation of food.  ?esophageal dysmotility vs stricture.   Plan Will reattempt to get the pt to dr Juanda Chance, big issue has been getting this pt to agree to see a GI MD and follow through on prior appts she has missed Dr Juanda Chance graciously agreed to see this pt Oct 24 D/c all cough meds as they have been ineffective Cont PPI nexium

## 2011-02-28 NOTE — Patient Instructions (Signed)
Stop tessalon , tussicaps, omeprazole Stay on Nexium You have an appt with Dr Juanda Chance on Oct 24th 2pm  For office visit. Return pulmonary as needed

## 2011-02-28 NOTE — Progress Notes (Signed)
Subjective:    Patient ID: Felicia Cruz, female    DOB: 24-Jan-1941, 70 y.o.   MRN: [id number]  Cough This is a chronic problem. The current episode started more than 1 month ago. The problem has been unchanged. The cough is non-productive. Pertinent negatives include no chest pain, chills, ear congestion, ear pain, fever, headaches, heartburn, hemoptysis, nasal congestion, postnasal drip, rash, rhinorrhea, sore throat, shortness of breath, weight loss or wheezing. Associated symptoms comments: Throat clearing noted Does have dysphagia, food is stuck No GI eval in past for dysphagia. Colonoscopy per Juanda Chance . The symptoms are aggravated by fumes, dust and lying down (coughs after meals,  ).   70 y.o. old female who presents with cough. Methacholine challenge Positve. GERD ppts cough. Lower airway inflammation with positive response to ICS and PPI Rx.    8/27 Seen by NP on 8/9 for chronic cough. NP assess and Rx as follows at that OV: cxr reviewed w/ no acute process.  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    Plan  Depo medrol injection 120mg  x 1  Delsym 2 tsp Twice daily (for cough)  Tessalon Three times a day (for cough)  Hydromet 1-2 tsp every 4-6 hr As needed Cough -this can make you sleepy  Add Pepcid 20mg  At bedtime  Add Zyrtec 10mg  daily  Add Chlorpheneramine (Chlor tabs ) 4mg  2 At bedtime  AVOID all mints.  Use sugarless candy , water to help avoid cough or throat clearing.  GERD diet  Cont on Nexium 40mg  daily before meals.  Hold all oil bases vitamins for now.   May laryngitis :  Went away but cough never went away. Cough now unchanged.   Pt is hoarse as well  02/28/2011 Did not get in with [facility]. Still with dysphagia . Cough is productive of thick white.  Notes after eat starts coughing.  No true hoarsness. Not dyspneic   Awakens at 430am coughing.  Pt is available Oct 25-27.    Review of Systems  Constitutional: Negative for fever, chills and weight loss.  HENT: Positive for trouble swallowing. Negative for ear pain, sore throat, rhinorrhea and postnasal drip.   Respiratory: Positive for cough. Negative for hemoptysis, shortness of breath and wheezing.   Cardiovascular: Negative for chest pain.  Gastrointestinal: Negative for heartburn.  Skin: Negative for rash.  Neurological: Negative for headaches.   Constitutional:   No  weight loss, night sweats,  Fevers, chills  HEENT:   No headaches,  Difficulty swallowing,  Tooth/dental problems,                 No sneezing, itching, ear ache, nasal congestion, post nasal drip,   CV:  No chest pain,  Orthopnea, PND, swelling in lower extremities, anasarca, dizziness, palpitations, syncope.   GI  No heartburn, indigestion, abdominal pain, nausea, vomiting, diarrhea, change in bowel habits, loss of appetite, bloody stools.  +dysphagia and regurgitation  Resp: No shortness of breath with exertion or at rest.  No coughing up of blood.     No chest wall deformity  Skin: no rash or lesions.  GU: no dysuria, change in color of urine, no urgency or frequency.  No flank pain, no hematuria   MS:  No joint pain or swelling.  No decreased range of motion.  No back pain.  Psych:  No change in mood or affect. No depression or anxiety.  No memory  loss.          Objective:   Physical Exam  Filed Vitals:   02/28/11 0954  BP: 150/82  Pulse: 82  Temp: 97.9 F (36.6 C)  TempSrc: Oral  Height: 5\' 6"  (1.676 m)  Weight: 165 lb (74.844 kg)  SpO2: 98%      GEN: A/Ox3; pleasant , NAD,  Frequent cough during exam.   HEENT:  [place]/AT,  EACs-clear, TMs-wnl, NOSE-clear, THROAT-clear, no lesions, no postnasal drip or exudate noted.   NECK:  Supple w/ fair ROM; no JVD; normal carotid impulses w/o bruits; no thyromegaly or nodules palpated; no lymphadenopathy.  RESP  Clear  w/ no wheezing.no accessory muscle use, no  dullness to percussion  CARD:  RRR, no m/r/g  , no peripheral edema, pulses intact, no cyanosis or clubbing.  GI:   Soft & nt; nml bowel sounds; no organomegaly or masses detected.  Musco: Warm bil, no deformities or joint swelling noted.   Neuro: alert, no focal deficits noted.    Skin: Warm, no lesions or rashes         Assessment & Plan:   Chronic cough History of reactive airway disease, now more cyclical cough failed high dose PPI plus H2 blocker.  Failed antihistamine and cyclical cough protocol. Now clear hx of dysphagia and regurgitation of food.  ?esophageal dysmotility vs stricture.   Plan Will reattempt to get the pt to dr Juanda Chance, big issue has been getting this pt to agree to see a GI MD and follow through on prior appts she has missed Dr Juanda Chance graciously agreed to see this pt Oct 24 D/c all cough meds as they have been ineffective Cont PPI nexium      Updated Medication List Outpatient Encounter Prescriptions as of 02/28/2011  Medication Sig Dispense Refill  . aspirin 81 MG EC tablet Take 81 mg by mouth daily.        Marland Kitchen buPROPion (WELLBUTRIN XL) 300 MG 24 hr tablet Take 1 tablet (300 mg total) by mouth every morning.  100 tablet  3  . calcium carbonate (OS-CAL) 600 MG TABS Take 600 mg by mouth daily.        Marland Kitchen esomeprazole (NEXIUM) 40 MG capsule One p.o. B.i.d.  60 capsule  4  . LORazepam (ATIVAN) 1 MG tablet Take 1 tablet (1 mg total) by mouth 3 (three) times daily as needed.  100 tablet  1  . pramipexole (MIRAPEX) 0.25 MG tablet Take 1 tablet (0.25 mg total) by mouth 2 (two) times daily.  200 tablet  3  . Red Yeast Rice 600 MG CAPS Take by mouth daily.        Marland Kitchen zolpidem (AMBIEN) 5 MG tablet Take 5 mg by mouth at bedtime as needed.        Marland Kitchen DISCONTD: benzonatate (TESSALON) 100 MG capsule Take 100 mg by mouth 2 (two) times daily.        Marland Kitchen DISCONTD: benzonatate (TESSALON) 200 MG capsule TAKE 1 CAPSULE BY MOUTH THREE TIMES DAILY AS NEEDED FOR COUGH  30 capsule  0   . DISCONTD: Hydrocod Polst-Chlorphen Polst (TUSSICAPS) 10-8 MG CP12 Take 1 capsule by mouth 2 (two) times daily as needed.  30 each  0  . DISCONTD: omeprazole (PRILOSEC) 40 MG capsule Take 40 mg by mouth daily.        Marland Kitchen DISCONTD: beclomethasone (QVAR) 40 MCG/ACT inhaler HOLD for now  3 Inhaler  4  . DISCONTD: Cholecalciferol (VITAMIN D) 2000 UNITS CAPS Take  by mouth daily.

## 2011-03-18 LAB — DIFFERENTIAL
Eosinophils Absolute: 0
Eosinophils Relative: 0
Lymphocytes Relative: 12
Lymphs Abs: 1
Monocytes Absolute: 0.2
Monocytes Relative: 2 — ABNORMAL LOW

## 2011-03-18 LAB — URINALYSIS, ROUTINE W REFLEX MICROSCOPIC
Bilirubin Urine: NEGATIVE
Glucose, UA: NEGATIVE
Hgb urine dipstick: NEGATIVE
Ketones, ur: NEGATIVE
Nitrite: NEGATIVE
Specific Gravity, Urine: 1.01
pH: 7.5

## 2011-03-18 LAB — COMPREHENSIVE METABOLIC PANEL
ALT: 23
AST: 26
Albumin: 3.9
CO2: 24
Calcium: 8.9
Creatinine, Ser: 0.75
GFR calc Af Amer: >60
Sodium: 136

## 2011-03-18 LAB — CBC
MCHC: 33.8
MCV: 88.8
Platelets: 390
RBC: 4.41
WBC: 7.8

## 2011-03-18 LAB — URINE CULTURE: Colony Count: NO GROWTH

## 2011-03-27 ENCOUNTER — Ambulatory Visit (INDEPENDENT_AMBULATORY_CARE_PROVIDER_SITE_OTHER): Payer: Medicare Other | Admitting: Internal Medicine

## 2011-03-27 ENCOUNTER — Encounter: Payer: Self-pay | Admitting: Internal Medicine

## 2011-03-27 VITALS — BP 130/78 | HR 98 | Ht 66.0 in | Wt 167.4 lb

## 2011-03-27 DIAGNOSIS — K219 Gastro-esophageal reflux disease without esophagitis: Secondary | ICD-10-CM

## 2011-03-27 DIAGNOSIS — R1319 Other dysphagia: Secondary | ICD-10-CM

## 2011-03-27 NOTE — Patient Instructions (Addendum)
You have been scheduled for an endoscopy with dilation and bravo probe. Please follow written instructions given to you at your visit today. Continue on your Nexium for your procedure. CC: Dr Erma Pinto

## 2011-03-27 NOTE — Progress Notes (Signed)
Felicia Cruz 1940/07/18 MRN [id number]    History of Present Illness:  This is a 70 year old white female with a chronic cough followed by Dr. Delford Field. She also has solid food dysphagia and a history of gastroesophageal reflux previously evaluated with an upper endoscopy in January 2010. She had a benign stricture which was dilated with 14,15 and 16 mm dilators. She has a 3 cm hiatal hernia but no Barrett's esophagus. She has been on Nexium 40 mg twice a day. Her symptoms center around meals. 30 minutes postprandially, she starts to cough and starts to bring up white phlegm. After that, she starts to gag for about 30-60 minutes, and she has some urinary incontinence from straining and coughing. This has been happening on a daily basis mostly during the day. She stopped smoking 40 years ago.   Past Medical History  Diagnosis Date  . RLS (restless legs syndrome)   . Esophageal stricture 2010  . Intrinsic asthma, unspecified     9/09 positive Methacholine challege test, normal spirometry at baseline  . Hiatal hernia 2010  . Osteopenia   . Hyperlipemia   . Alcoholism   . Arthritis   . Kidney stones   . Asthma    Past Surgical History  Procedure Date  . Lumbar disc surgery 2008  . Facial cosmetic surgery     eyes, nose  . Abdominoplasty   . Dilation and curettage of uterus     x 2  . Liposuction     reports that she quit smoking about 40 years ago. Her smoking use included Cigarettes. She has a 10 pack-year smoking history. She has never used smokeless tobacco. She reports that she does not drink alcohol or use illicit drugs. family history includes Alzheimer's disease in her mother; Heart disease in her mother; Hip fracture in her mother; Hypertension in her mother; Stroke in her mother; Stroke (age of onset:39) in her brother; and Throat cancer in her father. Allergies  Allergen Reactions  . Penicillins     REACTION: hives  . Prednisone     Rash, itching        Review of  Systems: Denies abdominal pain diarrhea constipation  The remainder of the 10 point ROS is negative except as outlined in H&P   Physical Exam: General appearance  Well developed, in no distress. Eyes- non icteric. HEENT nontraumatic, normocephalic. Mouth no lesions, tongue papillated, no cheilosis. Neck supple without adenopathy, thyroid not enlarged, no carotid bruits, no JVD. Lungs Clear to auscultation bilaterally. Cor normal S1, normal S2, regular rhythm, no murmur,  quiet precordium. Abdomen: Soft nontender without mass. Liver edge at costal margin no distention. Skin no lesions. Neurological alert and oriented x 3. Psychological normal mood and affect.  Assessment and Plan:  Problem #1 Ongoing cough related to meals refractory to Nexium 40 mg twice a day and antireflux measures. Patient has recurrent solid food dysphagia such as recurrent esophageal stricture. She may be a candidate for Nissen fundoplication. First, we will proceed with an upper endoscopy, dilatation and bravo probe placement. This will be done while taking Nexium 40 mg twice a day. Afterwards, we will obtain a barium esophagram. I would consider referring her for a Nissen fundoplication depending on the results of the bravo probe.   03/27/2011 Lina Sar

## 2011-04-01 ENCOUNTER — Other Ambulatory Visit: Payer: Self-pay | Admitting: *Deleted

## 2011-04-01 DIAGNOSIS — F329 Major depressive disorder, single episode, unspecified: Secondary | ICD-10-CM

## 2011-04-01 MED ORDER — LORAZEPAM 1 MG PO TABS: 1.0000 mg | ORAL_TABLET | Freq: Three times a day (TID) | ORAL | Status: DC | PRN

## 2011-04-03 ENCOUNTER — Encounter (HOSPITAL_COMMUNITY): Payer: Self-pay

## 2011-04-03 ENCOUNTER — Ambulatory Visit (HOSPITAL_COMMUNITY): Admit: 2011-04-03 | Payer: Self-pay | Admitting: Internal Medicine

## 2011-04-03 ENCOUNTER — Ambulatory Visit (HOSPITAL_COMMUNITY)
Admission: RE | Admit: 2011-04-03 | Discharge: 2011-04-03 | Disposition: A | Discharge status: Home / Self Care | Payer: Medicare Other | Source: Ambulatory Visit | Attending: Internal Medicine | Admitting: Internal Medicine

## 2011-04-03 ENCOUNTER — Encounter: Payer: Medicare Other | Admitting: Internal Medicine

## 2011-04-03 DIAGNOSIS — K219 Gastro-esophageal reflux disease without esophagitis: Secondary | ICD-10-CM

## 2011-04-03 DIAGNOSIS — M949 Disorder of cartilage, unspecified: Secondary | ICD-10-CM | POA: Insufficient documentation

## 2011-04-03 DIAGNOSIS — K449 Diaphragmatic hernia without obstruction or gangrene: Secondary | ICD-10-CM | POA: Insufficient documentation

## 2011-04-03 DIAGNOSIS — G2581 Restless legs syndrome: Secondary | ICD-10-CM | POA: Insufficient documentation

## 2011-04-03 DIAGNOSIS — J45909 Unspecified asthma, uncomplicated: Secondary | ICD-10-CM | POA: Insufficient documentation

## 2011-04-03 DIAGNOSIS — R1319 Other dysphagia: Secondary | ICD-10-CM | POA: Insufficient documentation

## 2011-04-03 DIAGNOSIS — F1020 Alcohol dependence, uncomplicated: Secondary | ICD-10-CM | POA: Insufficient documentation

## 2011-04-03 DIAGNOSIS — E785 Hyperlipidemia, unspecified: Secondary | ICD-10-CM | POA: Insufficient documentation

## 2011-04-03 DIAGNOSIS — M129 Arthropathy, unspecified: Secondary | ICD-10-CM | POA: Insufficient documentation

## 2011-04-03 DIAGNOSIS — M899 Disorder of bone, unspecified: Secondary | ICD-10-CM | POA: Insufficient documentation

## 2011-04-03 DIAGNOSIS — Z79899 Other long term (current) drug therapy: Secondary | ICD-10-CM | POA: Insufficient documentation

## 2011-04-03 DIAGNOSIS — R32 Unspecified urinary incontinence: Secondary | ICD-10-CM | POA: Insufficient documentation

## 2011-04-03 DIAGNOSIS — Z7982 Long term (current) use of aspirin: Secondary | ICD-10-CM | POA: Insufficient documentation

## 2011-04-03 DIAGNOSIS — R05 Cough: Secondary | ICD-10-CM

## 2011-04-03 SURGERY — EGD, WITH DILATION USING SAVARY-GILLIARD DILATOR OVER GUIDEWIRE
Anesthesia: Moderate Sedation

## 2011-04-19 ENCOUNTER — Other Ambulatory Visit: Payer: Self-pay | Admitting: Internal Medicine

## 2011-04-19 ENCOUNTER — Telehealth: Payer: Self-pay | Admitting: Internal Medicine

## 2011-04-19 NOTE — Telephone Encounter (Signed)
I have left a message on pt's mobile phone with results of the Bravo probe. Please schedule her for Barium esophagram with Barium tablet, "pt being  evaluated for Nissen fundoplication"

## 2011-04-19 NOTE — Telephone Encounter (Signed)
Pt scheduled for barium esophagram with tablet @[facility]  04/23/11 arrival time 10:15am test at 10:30am. Message left for pt to call back.

## 2011-04-19 NOTE — Telephone Encounter (Signed)
Pt aware of appt date and time

## 2011-04-19 NOTE — Telephone Encounter (Signed)
Pt is calling requesting the results from her egd with bravo that was done 04/03/11. Dr. Juanda Chance please advise.

## 2011-04-20 NOTE — Consult Note (Signed)
[name], Felicia Cruz                ACCOUNT NO.:  [id number]  MEDICAL RECORD NO.:  [id number]  LOCATION:  [facility]                         FACILITY:  [facility]  PHYSICIAN:  Hedwig Morton. Juanda Chance, MD     DATE OF BIRTH:  05-13-1941  DATE OF CONSULTATION: DATE OF DISCHARGE:  04/03/2011                                CONSULTATION   Bravo pH monitoring study.  INDICATION:  This 70 year old female who has complaint of severe cough, heartburn, hoarseness, and symptoms of gastroesophageal reflux.  She has been on the maximum dose of Nexium 40 mg twice a day.  She is undergoing intraesophageal pH study while taking 2 Nexium a day to determine whether she is being treated appropriately.  The acid reflux analysis on day 1 shows the DeMeester score total of 1.0, which is normal index score.  Normal if anything less than 14.72.  The patient had total of 21 minutes of reflux, 12 minutes in upright position, and 9 minutes in supine position.  The patient had only 16 seconds of cough.  She had total of 4 episodes of reflux all in upright position.  Duration of the longest reflux episode was 1 minute.  The time pH less than 4 was registered only for 2 minutes total.  The acid reflux analysis on day 2 showed the patient to have a total of 18 minutes of reflux, 12 minutes in upright position, and 5 minutes in supine position.  Total of 49 episodes of reflux, the longest episode lasted 9 minutes and occurred in upright position.  There were 3 episodes of reflux lasting greater than 5 minutes.  The pH of less than 4 was registered 69 minutes total.  The total DeMeester score for acid analysis on day 2 was 21.2, which represents slightly elevated score.  IMPRESSION:  This is essentially normal intraesophageal pH study on the patient taking Nexium 40 mg twice a day indicating that she has an adequate acid suppression most of the time.  We will also obtain barium esophagram to assess her peristalsis as well as her  reflux during barium study.  The decision will be made as to whether she should be evaluated for Nissen fundoplication.     Hedwig Morton. Juanda Chance, MD     DMB/MEDQ  D:  04/19/2011  T:  04/19/2011  Job:  [id number]

## 2011-04-23 ENCOUNTER — Telehealth: Payer: Self-pay | Admitting: *Deleted

## 2011-04-23 ENCOUNTER — Ambulatory Visit (HOSPITAL_COMMUNITY)
Admission: RE | Admit: 2011-04-23 | Discharge: 2011-04-23 | Disposition: A | Discharge status: Home / Self Care | Payer: Medicare Other | Source: Ambulatory Visit | Attending: Internal Medicine | Admitting: Internal Medicine

## 2011-04-23 DIAGNOSIS — R059 Cough, unspecified: Secondary | ICD-10-CM | POA: Insufficient documentation

## 2011-04-23 DIAGNOSIS — K219 Gastro-esophageal reflux disease without esophagitis: Secondary | ICD-10-CM | POA: Insufficient documentation

## 2011-04-23 DIAGNOSIS — K449 Diaphragmatic hernia without obstruction or gangrene: Secondary | ICD-10-CM | POA: Insufficient documentation

## 2011-04-23 DIAGNOSIS — R1310 Dysphagia, unspecified: Secondary | ICD-10-CM | POA: Insufficient documentation

## 2011-04-23 DIAGNOSIS — R05 Cough: Secondary | ICD-10-CM | POA: Insufficient documentation

## 2011-04-23 NOTE — Telephone Encounter (Signed)
Patient would like to be scheduled to see Dr Daphine Deutscher for consult Nissen Fundoplication. She has been scheduled for 05/15/11 @ 4 pm. Patient has been advised and verbalizes understanding.

## 2011-04-23 NOTE — Telephone Encounter (Signed)
Message copied by Richardson Chiquito on Tue Apr 23, 2011  1:11 PM ------      Message from: Hart Carwin      Created: Tue Apr 23, 2011 11:56 AM       Please call pt with results of Ba esophagram : moderate reflux present with change of position, I think she would be a good candidate for Nissen Fundoplication. She can either come to discuss it with me or she may  Be set up to see Dr Harley Alto Daphine Deutscher for OV re :Nissen evaluation

## 2011-04-23 NOTE — Telephone Encounter (Signed)
Left voicemail for patient to call back. 

## 2011-04-23 NOTE — Progress Notes (Signed)
See phone note dated 04/23/11

## 2011-05-15 ENCOUNTER — Encounter (INDEPENDENT_AMBULATORY_CARE_PROVIDER_SITE_OTHER): Payer: Self-pay | Admitting: Surgery

## 2011-05-15 ENCOUNTER — Ambulatory Visit (INDEPENDENT_AMBULATORY_CARE_PROVIDER_SITE_OTHER): Payer: Medicare Other | Admitting: Surgery

## 2011-05-15 DIAGNOSIS — K219 Gastro-esophageal reflux disease without esophagitis: Secondary | ICD-10-CM

## 2011-05-15 NOTE — Progress Notes (Signed)
Chief Complaint:  Coughing, GER and small sliding HH  History of Present Illness:  Felicia Cruz is an 70 y.o. female comes in today referred for antireflux surgery.  She has had problems with uncontrolled coughing, nocturnal reflux with spasms of coughing leading to urinary incontinence. She's been evaluated by Dr. Dennie Bible right 4 asthma and saw Dr. Dickie La for GERD. Alonza Smoker is a primary physician.  A pulled up her upper GI series and showed her pictures. She has a small sliding hiatal hernia with reflux. I gave her a booklet on antireflux surgery and drew pictures and explained Nissen fundoplication in detail. She would like to go ahead and proceed with the surgery. He indicated his symptoms we have to do this open would likely do this laparoscopically. We'll go ahead and schedule this at her convenience. She is aware of the risks and benefits.  Past Medical History  Diagnosis Date  . RLS (restless legs syndrome)   . Esophageal stricture 2010  . Intrinsic asthma, unspecified     9/09 positive Methacholine challege test, normal spirometry at baseline  . Hiatal hernia 2010  . Osteopenia   . Hyperlipemia   . Alcoholism   . Arthritis   . Kidney stones   . Asthma   . GERD (gastroesophageal reflux disease)   . Substance abuse     Past Surgical History  Procedure Date  . Lumbar disc surgery 2008  . Facial cosmetic surgery     eyes, nose  . Abdominoplasty   . Dilation and curettage of uterus     x 2  . Liposuction     Medications Prior to Admission  Medication Sig Dispense Refill  . aspirin 81 MG EC tablet Take 81 mg by mouth daily.        Marland Kitchen buPROPion (WELLBUTRIN XL) 300 MG 24 hr tablet Take 1 tablet (300 mg total) by mouth every morning.  100 tablet  3  . calcium gluconate 500 MG tablet Take 500 mg by mouth daily.        Marland Kitchen LORazepam (ATIVAN) 1 MG tablet Take 1 tablet (1 mg total) by mouth 3 (three) times daily as needed.  100 tablet  1  . NEXIUM 40 MG capsule       . pramipexole  (MIRAPEX) 0.25 MG tablet Take 1 tablet (0.25 mg total) by mouth 2 (two) times daily.  200 tablet  3  . Red Yeast Rice 600 MG CAPS Take by mouth daily.         No current facility-administered medications on file as of 05/15/2011.   Allergies  Allergen Reactions  . Penicillins     REACTION: hives  . Prednisone     Rash, itching   Family History  Problem Relation Age of Onset  . Throat cancer Father     Smoker  . Heart disease Mother   . Hypertension Mother   . Hip fracture Mother   . Alzheimer's disease Mother   . Stroke Mother   . Stroke Brother 37    smoker   Social History:   reports that she quit smoking about 40 years ago. Her smoking use included Cigarettes. She has a 10 pack-year smoking history. She has never used smokeless tobacco. She reports that she does not drink alcohol or use illicit drugs.   REVIEW OF SYSTEMS - PERTINENT POSITIVES ONLY: Negative for significant cardiovascular events. Positive for arthritis, folate cholesterol, and some issues with alcohol use. She does have cough and arthritis pains.  Physical Exam:   Blood pressure 152/80, pulse 64, temperature 97.6 F (36.4 C), temperature source Temporal, resp. rate 16, height 5\' 6"  (1.676 m), weight 171 lb 8 oz (77.792 kg). Body mass index is 27.68 kg/(m^2).  Gen:  No acute distress.  Well nourished and well groomed.   Neurological: Alert and oriented to person, place, and time. Coordination normal.  Head: Normocephalic and atraumatic.  Eyes: Conjunctivae are normal. Pupils are equal, round, and reactive to light. No scleral icterus.  Neck: Normal range of motion. Neck supple. No tracheal deviation or thyromegaly present.  Cardiovascular:  SR without murmurs or gallops Respiratory: Effort normal.  No respiratory distress. No chest wall tenderness. Breath sounds normal.  No wheezes, rales or rhonchi.  GI: Soft. Bowel sounds are normal. The abdomen is soft and nontender.  There is no rebound and no  guarding. GU:  No inguinal herniae Musculoskeletal: Normal range of motion. Extremities are nontender.  Lymphadenopathy: No cervical, preauricular, postauricular or axillary adenopathy is present Skin: Skin is warm and dry. No rash noted. No diaphoresis. No erythema. No pallor. No clubbing, cyanosis, or edema.  Pscyh: Normal mood and affect. Behavior is normal. Judgment and thought content normal.   LABORATORY RESULTS: No results found for this or any previous visit (from the past 48 hour(s)).  RADIOLOGY RESULTS: No results found.  Problem List: Active Problems:  * No active hospital problems. *    Assessment & Plan: Gastroesophageal reflux disease with asthma, chronic cough, nocturnal reflux with maximal medical therapy including lifestyle changes as well. Plan laparoscopic Nissen fundoplication.    Matt B. Daphine Deutscher, MD, [facility] [phone] beeper [phone]  05/15/2011 5:56 PM

## 2011-05-15 NOTE — Assessment & Plan Note (Signed)
Small sliding hiatus hernia

## 2011-05-20 ENCOUNTER — Encounter: Payer: Self-pay | Admitting: Internal Medicine

## 2011-06-12 ENCOUNTER — Encounter (HOSPITAL_COMMUNITY): Payer: Self-pay

## 2011-06-19 DIAGNOSIS — M19049 Primary osteoarthritis, unspecified hand: Secondary | ICD-10-CM | POA: Diagnosis not present

## 2011-06-19 HISTORY — PX: OTHER SURGICAL HISTORY: SHX169

## 2011-06-20 ENCOUNTER — Other Ambulatory Visit: Payer: Self-pay | Admitting: Family Medicine

## 2011-06-20 MED ORDER — ESOMEPRAZOLE MAGNESIUM 40 MG PO CPDR: 40.0000 mg | DELAYED_RELEASE_CAPSULE | Freq: Two times a day (BID) | ORAL | Status: DC

## 2011-06-21 ENCOUNTER — Other Ambulatory Visit (HOSPITAL_COMMUNITY): Payer: Medicare Other

## 2011-06-24 ENCOUNTER — Encounter (HOSPITAL_COMMUNITY)
Admission: RE | Admit: 2011-06-24 | Discharge: 2011-06-24 | Disposition: A | Discharge status: Home / Self Care | Payer: Medicare Other | Source: Ambulatory Visit | Attending: Surgery | Admitting: Surgery

## 2011-06-24 ENCOUNTER — Encounter (HOSPITAL_COMMUNITY): Payer: Self-pay

## 2011-06-24 LAB — SURGICAL PCR SCREEN: Staphylococcus aureus: NEGATIVE

## 2011-06-24 NOTE — Patient Instructions (Signed)
[address]  06/24/2011   Your procedure is scheduled on:  WED 1/23  AT 8:30 AM  Report to [facility] at 6:30 AM.  Call this number if you have problems the morning of surgery: [phone]   Remember:   Do not eat food OR DRINK ANYTHING AFTER MIDNIGHT THE NIGHT BEFORE YOUR SURGERY.    Take these medicines the morning of surgery with A SIP OF WATER: NEXIUM, LORAZEPAM, WELLBUTRIN, MIRAPEX   Do not wear jewelry, make-up or nail polish.  Do not wear lotions, powders, or perfumes.  Do not shave 48 hours prior to surgery.  Do not bring valuables to the hospital.  Contacts, dentures or bridgework may not be worn into surgery.  Leave suitcase in the car. After surgery it may be brought to your room.  For patients admitted to the hospital, checkout time is 11:00 AM the day of discharge.   Patients discharged the day of surgery will not be allowed to drive home.    Special Instructions: CHG Shower Use Special Wash: 1/2 bottle night before surgery and 1/2 bottle morning of surgery.   Please read over the following fact sheets that you were given: MRSA Information

## 2011-06-24 NOTE — Pre-Procedure Instructions (Addendum)
2:30 PM 06/24/11  DR. MARTIN'S OFFICE NOTIFIED PT IS HERE AT [facility] FOR PREOP VISIT--NO ORDERS IN EPIC FROM DR. MARTIN THAT I CAN SEE--OFFICE WILL E-MAIL DR. MARTIN ABOUT ENTERING PREOP ORDERS. 3:20 PM 06/24/11--PT HAS CXR REPORT FROM [facility] DONE 12/25/10 -COPY ON THIS CHART AND IN EPIC. EKG REPORT ON CHART FROM DR.TODD'S OFFICE-DONE 07/26/10--COPY ON THIS CHART AND IN EPIC.  PT STATES MORE RECENT EKG WAS DONE AT [facility] WED 06/19/11--SHE HAD FINGER SURGERY.  MORE RECENT EKG WILL BE REQUESTED.  ANY LABS THAT DR. MARTIN NEEDS PREOP  AND OR CONSENT - WILL BE DONE DAY OF SURGERY. 06/25/11  PT'S EKG REPORT FROM [facility] - DONE 06/19/11 RECEIVED AND ON THIS CHART. 06/25/11 2PM  PT NOTIFIED TO USE FLEETS ENEMA AT HOME TONIGHT-AS PER ORDER DR. MARTIN-PT VOICED UNDERSTANDING.

## 2011-06-25 ENCOUNTER — Other Ambulatory Visit (INDEPENDENT_AMBULATORY_CARE_PROVIDER_SITE_OTHER): Payer: Self-pay | Admitting: Surgery

## 2011-06-25 ENCOUNTER — Encounter (HOSPITAL_COMMUNITY): Payer: Self-pay

## 2011-06-25 DIAGNOSIS — M20019 Mallet finger of unspecified finger(s): Secondary | ICD-10-CM | POA: Diagnosis not present

## 2011-06-26 ENCOUNTER — Inpatient Hospital Stay (HOSPITAL_COMMUNITY): Admission: RE | Admit: 2011-06-26 | Payer: Medicare Other | Source: Ambulatory Visit | Admitting: Surgery

## 2011-06-26 ENCOUNTER — Encounter (HOSPITAL_COMMUNITY): Payer: Self-pay | Admitting: *Deleted

## 2011-06-26 ENCOUNTER — Encounter (HOSPITAL_COMMUNITY): Payer: Self-pay | Admitting: Anesthesiology

## 2011-06-26 ENCOUNTER — Encounter (HOSPITAL_COMMUNITY)
Admission: RE | Disposition: A | Discharge status: Home / Self Care | Payer: Self-pay | Source: Ambulatory Visit | Attending: Surgery

## 2011-06-26 ENCOUNTER — Inpatient Hospital Stay (HOSPITAL_COMMUNITY): Payer: Medicare Other | Admitting: Anesthesiology

## 2011-06-26 ENCOUNTER — Inpatient Hospital Stay (HOSPITAL_COMMUNITY)
Admission: RE | Admit: 2011-06-26 | Discharge: 2011-06-28 | DRG: 328 | Disposition: A | Discharge status: Home / Self Care | Payer: Medicare Other | Source: Ambulatory Visit | Attending: Surgery | Admitting: Surgery

## 2011-06-26 ENCOUNTER — Encounter (HOSPITAL_COMMUNITY): Admission: RE | Payer: Self-pay | Source: Ambulatory Visit

## 2011-06-26 DIAGNOSIS — F411 Generalized anxiety disorder: Secondary | ICD-10-CM | POA: Diagnosis not present

## 2011-06-26 DIAGNOSIS — J45909 Unspecified asthma, uncomplicated: Secondary | ICD-10-CM | POA: Diagnosis present

## 2011-06-26 DIAGNOSIS — K219 Gastro-esophageal reflux disease without esophagitis: Principal | ICD-10-CM | POA: Diagnosis present

## 2011-06-26 DIAGNOSIS — Z01812 Encounter for preprocedural laboratory examination: Secondary | ICD-10-CM | POA: Diagnosis not present

## 2011-06-26 DIAGNOSIS — M899 Disorder of bone, unspecified: Secondary | ICD-10-CM | POA: Diagnosis present

## 2011-06-26 DIAGNOSIS — R05 Cough: Secondary | ICD-10-CM | POA: Diagnosis not present

## 2011-06-26 DIAGNOSIS — G2581 Restless legs syndrome: Secondary | ICD-10-CM | POA: Diagnosis present

## 2011-06-26 DIAGNOSIS — R059 Cough, unspecified: Secondary | ICD-10-CM | POA: Diagnosis present

## 2011-06-26 DIAGNOSIS — K449 Diaphragmatic hernia without obstruction or gangrene: Secondary | ICD-10-CM | POA: Diagnosis not present

## 2011-06-26 DIAGNOSIS — K210 Gastro-esophageal reflux disease with esophagitis: Secondary | ICD-10-CM

## 2011-06-26 DIAGNOSIS — E785 Hyperlipidemia, unspecified: Secondary | ICD-10-CM | POA: Diagnosis present

## 2011-06-26 DIAGNOSIS — F1011 Alcohol abuse, in remission: Secondary | ICD-10-CM | POA: Diagnosis present

## 2011-06-26 HISTORY — PX: LAPAROSCOPIC NISSEN FUNDOPLICATION: SHX1932

## 2011-06-26 LAB — CREATININE, SERUM
Creatinine, Ser: 0.65 mg/dL (ref 0.50–1.10)
GFR calc Af Amer: >90 mL/min (ref >90)
GFR calc non Af Amer: 88 mL/min — ABNORMAL LOW (ref >90)

## 2011-06-26 LAB — CBC
Hemoglobin: 10.2 g/dL — ABNORMAL LOW (ref 12.0–15.0)
MCH: 25.3 pg — ABNORMAL LOW (ref 26.0–34.0)
MCV: 76.4 fL — ABNORMAL LOW (ref 78.0–100.0)
RBC: 4.03 MIL/uL (ref 3.87–5.11)
WBC: 5.4 10*3/uL (ref 4.0–10.5)

## 2011-06-26 SURGERY — REPAIR, HERNIA, HIATAL, LAPAROSCOPIC
Anesthesia: General

## 2011-06-26 SURGERY — FUNDOPLICATION, NISSEN, LAPAROSCOPIC
Anesthesia: General | Site: Abdomen | Wound class: Clean

## 2011-06-26 MED ORDER — BUPIVACAINE LIPOSOME 1.3 % IJ SUSP
20.0000 mL | INTRAMUSCULAR | Status: DC
  Filled 2011-06-26: qty 20

## 2011-06-26 MED ORDER — KCL IN DEXTROSE-NACL 20-5-0.45 MEQ/L-%-% IV SOLN
INTRAVENOUS | Status: DC
  Administered 2011-06-26 – 2011-06-28 (×4): via INTRAVENOUS
  Filled 2011-06-26 (×6): qty 1000

## 2011-06-26 MED ORDER — FENTANYL CITRATE 0.05 MG/ML IJ SOLN
INTRAMUSCULAR | Status: DC | PRN
  Administered 2011-06-26 (×2): 100 ug via INTRAVENOUS
  Administered 2011-06-26: 50 ug via INTRAVENOUS

## 2011-06-26 MED ORDER — EPHEDRINE SULFATE 50 MG/ML IJ SOLN
INTRAMUSCULAR | Status: DC | PRN
  Administered 2011-06-26: 5 mg via INTRAVENOUS
  Administered 2011-06-26: 10 mg via INTRAVENOUS

## 2011-06-26 MED ORDER — HYDROMORPHONE HCL PF 1 MG/ML IJ SOLN: 0.2500 mg | INTRAMUSCULAR | Status: DC | PRN

## 2011-06-26 MED ORDER — HEPARIN SODIUM (PORCINE) 5000 UNIT/ML IJ SOLN
5000.0000 [IU] | Freq: Once | INTRAMUSCULAR | Status: AC
  Administered 2011-06-26: 5000 [IU] via SUBCUTANEOUS

## 2011-06-26 MED ORDER — NEOSTIGMINE METHYLSULFATE 1 MG/ML IJ SOLN
INTRAMUSCULAR | Status: DC | PRN
  Administered 2011-06-26: 5 mg via INTRAVENOUS

## 2011-06-26 MED ORDER — PHENYLEPHRINE HCL 10 MG/ML IJ SOLN
INTRAMUSCULAR | Status: DC | PRN
  Administered 2011-06-26 (×2): 40 ug via INTRAVENOUS

## 2011-06-26 MED ORDER — DROPERIDOL 2.5 MG/ML IJ SOLN
INTRAMUSCULAR | Status: DC | PRN
  Administered 2011-06-26: 0.625 mg via INTRAVENOUS

## 2011-06-26 MED ORDER — HYDROMORPHONE HCL PF 2 MG/ML IJ SOLN: 0.5000 mg | INTRAMUSCULAR | Status: DC | PRN

## 2011-06-26 MED ORDER — PROMETHAZINE HCL 25 MG/ML IJ SOLN
12.5000 mg | Freq: Four times a day (QID) | INTRAMUSCULAR | Status: DC | PRN
  Administered 2011-06-26: 12.5 mg via INTRAVENOUS
  Filled 2011-06-26: qty 1

## 2011-06-26 MED ORDER — BUPIVACAINE LIPOSOME 1.3 % IJ SUSP
INTRAMUSCULAR | Status: DC | PRN
  Administered 2011-06-26: 20 mL

## 2011-06-26 MED ORDER — ROCURONIUM BROMIDE 100 MG/10ML IV SOLN
INTRAVENOUS | Status: DC | PRN
  Administered 2011-06-26: 20 mg via INTRAVENOUS
  Administered 2011-06-26: 50 mg via INTRAVENOUS

## 2011-06-26 MED ORDER — LORAZEPAM 2 MG/ML IJ SOLN: 0.5000 mg | Freq: Three times a day (TID) | INTRAMUSCULAR | Status: DC | PRN

## 2011-06-26 MED ORDER — OXYCODONE-ACETAMINOPHEN 5-325 MG/5ML PO SOLN
10.0000 mL | ORAL | Status: DC | PRN
  Administered 2011-06-27 – 2011-06-28 (×3): 10 mL via ORAL
  Filled 2011-06-26 (×2): qty 5
  Filled 2011-06-26 (×3): qty 10

## 2011-06-26 MED ORDER — HYDROMORPHONE HCL PF 1 MG/ML IJ SOLN
0.5000 mg | INTRAMUSCULAR | Status: DC | PRN
  Administered 2011-06-26: 21:00:00 via INTRAVENOUS
  Filled 2011-06-26 (×2): qty 1

## 2011-06-26 MED ORDER — PROMETHAZINE HCL 25 MG/ML IJ SOLN: 6.2500 mg | INTRAMUSCULAR | Status: DC | PRN

## 2011-06-26 MED ORDER — ONDANSETRON HCL 4 MG/2ML IJ SOLN
INTRAMUSCULAR | Status: DC | PRN
  Administered 2011-06-26: 4 mg via INTRAVENOUS

## 2011-06-26 MED ORDER — SUCCINYLCHOLINE CHLORIDE 20 MG/ML IJ SOLN
INTRAMUSCULAR | Status: DC | PRN
  Administered 2011-06-26: 100 mg via INTRAVENOUS

## 2011-06-26 MED ORDER — DEXAMETHASONE SODIUM PHOSPHATE 10 MG/ML IJ SOLN
INTRAMUSCULAR | Status: DC | PRN
  Administered 2011-06-26: 10 mg via INTRAVENOUS

## 2011-06-26 MED ORDER — DEXTROSE 5 % IV SOLN: 1.0000 g | INTRAVENOUS | Status: DC

## 2011-06-26 MED ORDER — LACTATED RINGERS IV SOLN
INTRAVENOUS | Status: DC | PRN
  Administered 2011-06-26 (×3): via INTRAVENOUS

## 2011-06-26 MED ORDER — ACETAMINOPHEN 10 MG/ML IV SOLN
INTRAVENOUS | Status: DC | PRN
  Administered 2011-06-26: 1000 mg via INTRAVENOUS

## 2011-06-26 MED ORDER — ONDANSETRON HCL 4 MG/2ML IJ SOLN
4.0000 mg | Freq: Four times a day (QID) | INTRAMUSCULAR | Status: DC | PRN
  Administered 2011-06-26: 4 mg via INTRAVENOUS
  Filled 2011-06-26: qty 2

## 2011-06-26 MED ORDER — ACETAMINOPHEN 10 MG/ML IV SOLN
1000.0000 mg | Freq: Four times a day (QID) | INTRAVENOUS | Status: AC
  Administered 2011-06-26 – 2011-06-27 (×4): 1000 mg via INTRAVENOUS
  Filled 2011-06-26 (×6): qty 100

## 2011-06-26 MED ORDER — MIDAZOLAM HCL 5 MG/5ML IJ SOLN
INTRAMUSCULAR | Status: DC | PRN
  Administered 2011-06-26: 2 mg via INTRAVENOUS

## 2011-06-26 MED ORDER — HEPARIN SODIUM (PORCINE) 5000 UNIT/ML IJ SOLN
5000.0000 [IU] | Freq: Three times a day (TID) | INTRAMUSCULAR | Status: DC
  Administered 2011-06-26 – 2011-06-28 (×6): 5000 [IU] via SUBCUTANEOUS
  Filled 2011-06-26 (×9): qty 1

## 2011-06-26 MED ORDER — CIPROFLOXACIN IN D5W 400 MG/200ML IV SOLN
400.0000 mg | Freq: Two times a day (BID) | INTRAVENOUS | Status: DC
  Administered 2011-06-26: 400 mg via INTRAVENOUS

## 2011-06-26 MED ORDER — PROPOFOL 10 MG/ML IV BOLUS
INTRAVENOUS | Status: DC | PRN
  Administered 2011-06-26: 180 mg via INTRAVENOUS

## 2011-06-26 MED ORDER — LACTATED RINGERS IV SOLN
INTRAVENOUS | Status: DC
  Administered 2011-06-26: 1000 mL via INTRAVENOUS

## 2011-06-26 MED ORDER — ACETAMINOPHEN 10 MG/ML IV SOLN: 15.0000 mg/kg | Freq: Four times a day (QID) | INTRAVENOUS | Status: DC

## 2011-06-26 MED ORDER — GLYCOPYRROLATE 0.2 MG/ML IJ SOLN
INTRAMUSCULAR | Status: DC | PRN
  Administered 2011-06-26: .6 mg via INTRAVENOUS

## 2011-06-26 MED ORDER — LACTATED RINGERS IV SOLN
INTRAVENOUS | Status: DC | PRN
  Administered 2011-06-26: 1000 mL

## 2011-06-26 SURGICAL SUPPLY — 56 items
APL SKNCLS STERI-STRIP NONHPOA (GAUZE/BANDAGES/DRESSINGS) ×1
APPLIER CLIP ROT 10 11.4 M/L (STAPLE)
APR CLP MED LRG 11.4X10 (STAPLE)
BENZOIN TINCTURE PRP APPL 2/3 (GAUZE/BANDAGES/DRESSINGS) ×2 IMPLANT
CABLE HIGH FREQUENCY MONO STRZ (ELECTRODE) IMPLANT
CANISTER SUCTION 2500CC (MISCELLANEOUS) ×1 IMPLANT
CLAMP ENDO BABCK 10MM (STAPLE) IMPLANT
CLIP APPLIE ROT 10 11.4 M/L (STAPLE) IMPLANT
CLOTH BEACON ORANGE TIMEOUT ST (SAFETY) ×2 IMPLANT
COVER SURGICAL LIGHT HANDLE (MISCELLANEOUS) ×2 IMPLANT
DECANTER SPIKE VIAL GLASS SM (MISCELLANEOUS) ×2 IMPLANT
DEVICE SUT QUICK LOAD TK 5 (STAPLE) ×3 IMPLANT
DEVICE SUT TI-KNOT TK 5X26 (MISCELLANEOUS) ×1 IMPLANT
DEVICE SUTURE ENDOST 10MM (ENDOMECHANICALS) ×2 IMPLANT
DISSECTOR BLUNT TIP ENDO 5MM (MISCELLANEOUS) ×2 IMPLANT
DRAIN PENROSE 18X1/2 LTX STRL (DRAIN) ×2 IMPLANT
DRAPE LAPAROSCOPIC ABDOMINAL (DRAPES) ×2 IMPLANT
ELECT REM PT RETURN 9FT ADLT (ELECTROSURGICAL) ×2
ELECTRODE REM PT RTRN 9FT ADLT (ELECTROSURGICAL) ×1 IMPLANT
FELT TEFLON 4 X1 (Mesh General) ×2 IMPLANT
FILTER SMOKE EVAC LAPAROSHD (FILTER) IMPLANT
GLOVE BIOGEL M 8.0 STRL (GLOVE) ×2 IMPLANT
GLOVE BIOGEL PI IND STRL 7.0 (GLOVE) IMPLANT
GLOVE BIOGEL PI INDICATOR 7.0 (GLOVE) ×4
GOWN STRL NON-REIN LRG LVL3 (GOWN DISPOSABLE) ×3 IMPLANT
GOWN STRL REIN XL XLG (GOWN DISPOSABLE) ×4 IMPLANT
GRASPER ENDO BABCOCK 10 (MISCELLANEOUS) IMPLANT
GRASPER ENDO BABCOCK 10MM (MISCELLANEOUS)
HAND ACTIVATED (MISCELLANEOUS) ×2 IMPLANT
KIT BASIN OR (CUSTOM PROCEDURE TRAY) ×2 IMPLANT
NS IRRIG 1000ML POUR BTL (IV SOLUTION) ×2 IMPLANT
PENCIL BUTTON HOLSTER BLD 10FT (ELECTRODE) IMPLANT
SCISSORS LAP 5X35 DISP (ENDOMECHANICALS) ×2 IMPLANT
SET IRRIG TUBING LAPAROSCOPIC (IRRIGATION / IRRIGATOR) ×2 IMPLANT
SLEEVE ADV FIXATION 5X100MM (TROCAR) IMPLANT
SLEEVE Z-THREAD 5X100MM (TROCAR) ×3 IMPLANT
SOLUTION ANTI FOG 6CC (MISCELLANEOUS) ×2 IMPLANT
STAPLER VISISTAT 35W (STAPLE) ×2 IMPLANT
STRIP CLOSURE SKIN 1/2X4 (GAUZE/BANDAGES/DRESSINGS) IMPLANT
SUT SURGIDAC NAB ES-9 0 48 120 (SUTURE) ×10 IMPLANT
SUT VIC AB 4-0 SH 18 (SUTURE) ×2 IMPLANT
SYR 30ML LL (SYRINGE) ×2 IMPLANT
TIP INNERVISION DETACH 40FR (MISCELLANEOUS) IMPLANT
TIP INNERVISION DETACH 50FR (MISCELLANEOUS) IMPLANT
TIP INNERVISION DETACH 56FR (MISCELLANEOUS) ×1 IMPLANT
TIPS INNERVISION DETACH 40FR (MISCELLANEOUS)
TRAY FOLEY CATH 14FRSI W/METER (CATHETERS) ×2 IMPLANT
TRAY LAP CHOLE (CUSTOM PROCEDURE TRAY) ×2 IMPLANT
TROCAR ADV FIXATION 11X100MM (TROCAR) IMPLANT
TROCAR ADV FIXATION 5X100MM (TROCAR) IMPLANT
TROCAR XCEL BLUNT TIP 100MML (ENDOMECHANICALS) IMPLANT
TROCAR XCEL NON-BLD 11X100MML (ENDOMECHANICALS) IMPLANT
TROCAR Z-THREAD FIOS 11X100 BL (TROCAR) ×2 IMPLANT
TROCAR Z-THREAD FIOS 5X100MM (TROCAR) ×2 IMPLANT
TROCAR Z-THREAD SLEEVE 11X100 (TROCAR) IMPLANT
TUBING FILTER THERMOFLATOR (ELECTROSURGICAL) ×2 IMPLANT

## 2011-06-26 NOTE — Anesthesia Procedure Notes (Signed)
Procedure Name: Intubation Date/Time: 06/26/2011 8:42 AM Performed by: [place], Shawnette Augello C Pre-anesthesia Checklist: Patient identified, Timeout performed, Emergency Drugs available, Suction available and Patient being monitored Patient Re-evaluated:Patient Re-evaluated prior to inductionOxygen Delivery Method: Circle System Utilized Preoxygenation: Pre-oxygenation with 100% oxygen Intubation Type: IV induction, Rapid sequence and Cricoid Pressure applied Laryngoscope Size: Mac and 3 Grade View: Grade I Tube type: Oral Number of attempts: 1 Airway Equipment and Method: stylet Placement Confirmation: ETT inserted through vocal cords under direct vision,  breath sounds checked- equal and bilateral,  positive ETCO2 and CO2 detector Secured at: 21 cm Tube secured with: Tape Dental Injury: Teeth and Oropharynx as per pre-operative assessment

## 2011-06-26 NOTE — Op Note (Signed)
Surgeon: Wenda Low, MD, FACS  Asst:  Cicero Duck, M.D., FACS  Anes:  General  Procedure: Laparoscopic repair of hiatal hernia, Nissen fundoplication over a #56 lighted bougie, 2 suture pledgeted posterior repair of hiatus, 3 suture wrap  Diagnosis: GERD with asthma  Complications: None  EBL:   Minimal cc  Description of Procedure:  The patient was taken to room 11 on Wednesday, 06/26/2011. The abdomen was prepped with PCMX and draped sterilely and a timeout performed. Access to the abdomen was achieved to the left upper quadrant with a 0 5 mm Optiview technique without difficulty. A second 5 was placed on the left side for the camera and a 12 another 5 or placed on the right for operating a 5 mm in the upper midline was used for placement of the [facility] retractor. Utilization of the foregut revealed a prominent hiatal hernia with anterior and cephalic movement of the upper portion of the stomach area.   Harmonic scalpel was used to take down the gastrohepatic window.  Short gastric vessels were taken down to free the left side and this dissection was carried up on the left crus. We then put a Penrose drain behind the esophagogastric junction and freed the esophagus and had excellent length into the abdomen. The stomach was nicely mobilized and could be brought around for subsequent invagination.  The hiatus was then repaired with 2 pledgeted sutures using Endo Stitch and extracorporeal free ties. The 56 lighted bougie was then passed without difficulty but it completely occupied the hiatal defect. The esophagus was then invaginated within the wrap of stomach which was then sutured with 3 Endo Stitch as the upper 2 secured with Ty knots in the lower with a free tie intracorporeally. Wrap appeared secure and angina free. The bougie was withdrawn and a smooth final Endo Stitch was used anteriorly to approximate the peritoneum over the upper portion of the hiatus. Incisions were all injected with  Exparel and closed with 4-0 Vicryl and Dermabond.  Matt B. Daphine Deutscher, MD, [facility], [place] [phone]

## 2011-06-26 NOTE — Anesthesia Postprocedure Evaluation (Signed)
  Anesthesia Post-op Note  Patient: Felicia Cruz  Procedure(s) Performed:  LAPAROSCOPIC NISSEN FUNDOPLICATION - Laparoscopic Nissen repair of hiatal hernia  with lighted bougie  Patient Location: PACU  Anesthesia Type: General  Level of Consciousness: awake and alert   Airway and Oxygen Therapy: Patient Spontanous Breathing  Post-op Pain: mild  Post-op Assessment: Post-op Vital signs reviewed, Patient's Cardiovascular Status Stable, Respiratory Function Stable, Patent Airway and No signs of Nausea or vomiting  Post-op Vital Signs: stable  Complications: No apparent anesthesia complications

## 2011-06-26 NOTE — Transfer of Care (Signed)
Immediate Anesthesia Transfer of Care Note  Patient: Felicia Cruz  Procedure(s) Performed:  LAPAROSCOPIC NISSEN FUNDOPLICATION - Laparoscopic Nissen repair of hiatal hernia  with lighted bougie  Patient Location: PACU  Anesthesia Type: General  Level of Consciousness: awake and alert   Airway & Oxygen Therapy: Patient Spontanous Breathing  Post-op Assessment: Report given to PACU RN and Post -op Vital signs reviewed and stable  Post vital signs: Reviewed and stable  Complications: No apparent anesthesia complications

## 2011-06-26 NOTE — Progress Notes (Signed)
State compliant with bowel prep as directed by Dr. Daphine Deutscher

## 2011-06-26 NOTE — Anesthesia Preprocedure Evaluation (Signed)
Anesthesia Evaluation  Patient identified by MRN, date of birth, ID band Patient awake    Reviewed: Allergy & Precautions, H&P , NPO status , Patient's Chart, lab work & pertinent test results  Airway Mallampati: II TM Distance: <3 FB Neck ROM: Full    Dental No notable dental hx.    Pulmonary neg pulmonary ROS, asthma ,  clear to auscultation  Pulmonary exam normal       Cardiovascular neg cardio ROS Regular Normal    Neuro/Psych Anxiety Negative Neurological ROS  Negative Psych ROS   GI/Hepatic negative GI ROS, Neg liver ROS, GERD-  ,(+)     substance abuse  alcohol use,   Endo/Other  Negative Endocrine ROS  Renal/GU negative Renal ROS  Genitourinary negative   Musculoskeletal negative musculoskeletal ROS (+)   Abdominal   Peds negative pediatric ROS (+)  Hematology negative hematology ROS (+)   Anesthesia Other Findings   Reproductive/Obstetrics negative OB ROS                           Anesthesia Physical Anesthesia Plan  ASA: III  Anesthesia Plan: General   Post-op Pain Management:    Induction: Intravenous, Cricoid pressure planned and Rapid sequence  Airway Management Planned: Oral ETT  Additional Equipment:   Intra-op Plan:   Post-operative Plan: Extubation in OR  Informed Consent: I have reviewed the patients History and Physical, chart, labs and discussed the procedure including the risks, benefits and alternatives for the proposed anesthesia with the patient or authorized representative who has indicated his/her understanding and acceptance.   Dental advisory given  Plan Discussed with: CRNA  Anesthesia Plan Comments:         Anesthesia Quick Evaluation

## 2011-06-26 NOTE — H&P (Signed)
Chief Complaint:  GERD and chronic cough  History of Present Illness:   Felicia Cruz is an 71 y.o. female comes in today referred for antireflux surgery. She has had problems with uncontrolled coughing, nocturnal reflux with spasms of coughing leading to urinary incontinence. She's been evaluated by Dr. Danise Mina for asthma and saw Dr. Juanda Chance for GERD. Alonza Smoker is a primary physician.  I pulled up her upper GI series and showed her pictures. She has a small sliding hiatal hernia with reflux. I gave her a booklet on antireflux surgery and drew pictures and explained Nissen fundoplication in detail. She would like to go ahead and proceed with the surgery. She is aware that we may   have to do this open but would likely do this laparoscopically. We'll go ahead and schedule this at her convenience. She is aware of the risks and benefits.    Past Medical History  Diagnosis Date  . RLS (restless legs syndrome)   . Esophageal stricture 2010  . Hiatal hernia 2010  . Osteopenia   . Hyperlipemia   . Arthritis   . Kidney stones   . GERD (gastroesophageal reflux disease)   . Substance abuse     RECOVERING ALCOLHOLIC  . Intrinsic asthma, unspecified     9/09 positive Methacholine challege test, normal spirometry at baseline  . Asthma     PT STATES SHE WAS TREATED FOR 2 YRS FOR ASTHMA--BUT NOW HER SX'S HAVE BEEN ATTRIBUTED TO ACID REFLUX / HIATAL HERNIA  . Alcoholism     HAS NOT HAD DRINK SINCE 1995    Past Surgical History  Procedure Date  . Lumbar disc surgery 2008  . Facial cosmetic surgery     eyes, nose  . Abdominoplasty   . Dilation and curettage of uterus     x 2  . Liposuction   . Surgery for kidney stones   . Right index finger surgery 06/19/11    AT [facility]    Current Facility-Administered Medications  Medication Dose Route Frequency Provider Last Rate Last Dose  . cefOXitin (MEFOXIN) 1 g in dextrose 5 % 50 mL IVPB  1 g Intravenous 60 min Pre-Op Valarie Merino, MD      . heparin injection 5,000 Units  5,000 Units Subcutaneous Once Valarie Merino, MD   5,000 Units at 06/26/11 0645   Penicillins; Prednisone; and Latex Family History  Problem Relation Age of Onset  . Throat cancer Father     Smoker  . Heart disease Mother   . Hypertension Mother   . Hip fracture Mother   . Alzheimer's disease Mother   . Stroke Mother   . Stroke Brother 33    smoker   Social History:   reports that she quit smoking about 41 years ago. Her smoking use included Cigarettes. She has a 10 pack-year smoking history. She has never used smokeless tobacco. She reports that she does not drink alcohol or use illicit drugs.   REVIEW OF SYSTEMS - PERTINENT POSITIVES ONLY: noncontributory  Physical Exam:   Blood pressure 135/75, pulse 90, temperature 97.7 F (36.5 C), temperature source Oral, resp. rate 18, SpO2 97.00%. There is no height or weight on file to calculate BMI.  Gen:  WDWN white female NAD  Neurological: Alert and oriented to person, place, and time. Motor and sensory function is grossly intact  Head: Normocephalic and atraumatic.  Eyes: Conjunctivae are normal. Pupils are equal, round, and reactive to light. No  scleral icterus.  Neck: Normal range of motion. Neck supple. No tracheal deviation or thyromegaly present.  Cardiovascular:  SR without murmurs or gallops.  No carotid bruits Respiratory: Effort normal.  No respiratory distress. No chest wall tenderness. Breath sounds normal.  No wheezes, rales or rhonchi.  Abdomen: nontender GU: Musculoskeletal: Normal range of motion. Extremities are nontender. No cyanosis, edema or clubbing noted Lymphadenopathy: No cervical, preauricular, postauricular or axillary adenopathy is present Skin: Skin is warm and dry. No rash noted. No diaphoresis. No erythema. No pallor. Pscyh: Normal mood and affect. Behavior is normal. Judgment and thought content normal.   LABORATORY RESULTS: Results for orders  placed during the hospital encounter of 06/26/11 (from the past 48 hour(s))  CBC     Status: Abnormal   Collection Time   06/26/11  6:25 AM      Component Value Range Comment   WBC 5.4  4.0 - 10.5 (K/uL)    RBC 4.03  3.87 - 5.11 (MIL/uL)    Hemoglobin 10.2 (*) 12.0 - 15.0 (g/dL)    HCT 29.5 (*) 28.4 - 46.0 (%)    MCV 76.4 (*) 78.0 - 100.0 (fL)    MCH 25.3 (*) 26.0 - 34.0 (pg)    MCHC 33.1  30.0 - 36.0 (g/dL)    RDW 13.2 (*) 44.0 - 15.5 (%)    Platelets 413 (*) 150 - 400 (K/uL)     RADIOLOGY RESULTS: No results found.  Problem List: Patient Active Problem List  Diagnoses  . HYPERLIPIDEMIA  . RESTLESS LEG SYNDROME  . ASTHMA, INTRINSIC  . GERD  . OSTEOPENIA  . Chronic cough    Assessment & Plan: GER and asthma.  Plan lap hiatal hernia repair and Nissen fundoplication    Matt B. Daphine Deutscher, MD, [facility] [phone] beeper [phone]  06/26/2011 6:57 AM

## 2011-06-27 ENCOUNTER — Inpatient Hospital Stay (HOSPITAL_COMMUNITY): Payer: Medicare Other

## 2011-06-27 DIAGNOSIS — Z5189 Encounter for other specified aftercare: Secondary | ICD-10-CM | POA: Diagnosis not present

## 2011-06-27 LAB — CBC
HCT: 29 % — ABNORMAL LOW (ref 36.0–46.0)
MCHC: 32.1 g/dL (ref 30.0–36.0)
Platelets: 377 10*3/uL (ref 150–400)
RDW: 16.2 % — ABNORMAL HIGH (ref 11.5–15.5)

## 2011-06-27 LAB — DIFFERENTIAL
Basophils Absolute: 0 10*3/uL (ref 0.0–0.1)
Basophils Relative: 0 % (ref 0–1)
Monocytes Absolute: 0.5 10*3/uL (ref 0.1–1.0)
Neutro Abs: 4 10*3/uL (ref 1.7–7.7)
Neutrophils Relative %: 67 % (ref 43–77)

## 2011-06-27 MED ORDER — IOHEXOL 300 MG/ML  SOLN: 50.0000 mL | Freq: Once | INTRAMUSCULAR | Status: AC | PRN

## 2011-06-27 NOTE — Progress Notes (Signed)
Patient ID: Felicia Cruz, female   DOB: 1940-09-04, 71 y.o.   MRN: [id number] [facility] Progress Note:   1 Day Post-Op  Subjective: Mental status is clear.  No complaints.  Was started on regular diet by some EPIC default Objective: Vital signs in last 24 hours: Temp:  [96.2 F (35.7 C)-98.4 F (36.9 C)] 97.9 F (36.6 C) (01/24 0546) Pulse Rate:  [72-99] 80  (01/24 0658) Resp:  [15-22] 20  (01/24 0546) BP: (147-182)/(68-90) 156/70 mmHg (01/24 0658) SpO2:  [93 %-100 %] 94 % (01/24 0546) Weight:  [172 lb (78.019 kg)] 172 lb (78.019 kg) (01/23 1145)  Intake/Output from previous day: 01/23 0701 - 01/24 0700 In: 4160 [P.O.:360; I.V.:3800] Out: 2075 [Urine:2050; Blood:25] Intake/Output this shift:    Physical Exam: Work of breathing is  normal  Lab Results:  Results for orders placed during the hospital encounter of 06/26/11 (from the past 48 hour(s))  CBC     Status: Abnormal   Collection Time   06/26/11  6:25 AM      Component Value Range Comment   WBC 5.4  4.0 - 10.5 (K/uL)    RBC 4.03  3.87 - 5.11 (MIL/uL)    Hemoglobin 10.2 (*) 12.0 - 15.0 (g/dL)    HCT 40.9 (*) 81.1 - 46.0 (%)    MCV 76.4 (*) 78.0 - 100.0 (fL)    MCH 25.3 (*) 26.0 - 34.0 (pg)    MCHC 33.1  30.0 - 36.0 (g/dL)    RDW 91.4 (*) 78.2 - 15.5 (%)    Platelets 413 (*) 150 - 400 (K/uL)   CREATININE, SERUM     Status: Abnormal   Collection Time   06/26/11  6:25 AM      Component Value Range Comment   Creatinine, Ser 0.65  0.50 - 1.10 (mg/dL)    GFR calc non Af Amer 88 (*) >90 (mL/min)    GFR calc Af Amer >90  >90 (mL/min)   CBC     Status: Abnormal   Collection Time   06/27/11  4:15 AM      Component Value Range Comment   WBC 6.0  4.0 - 10.5 (K/uL)    RBC 3.83 (*) 3.87 - 5.11 (MIL/uL)    Hemoglobin 9.3 (*) 12.0 - 15.0 (g/dL)    HCT 95.6 (*) 21.3 - 46.0 (%)    MCV 75.7 (*) 78.0 - 100.0 (fL)    MCH 24.3 (*) 26.0 - 34.0 (pg)    MCHC 32.1  30.0 - 36.0 (g/dL)    RDW 08.6 (*) 57.8 - 15.5 (%)    Platelets 377  150 - 400 (K/uL)   DIFFERENTIAL     Status: Normal   Collection Time   06/27/11  4:15 AM      Component Value Range Comment   Neutrophils Relative 67  43 - 77 (%)    Neutro Abs 4.0  1.7 - 7.7 (K/uL)    Lymphocytes Relative 25  12 - 46 (%)    Lymphs Abs 1.5  0.7 - 4.0 (K/uL)    Monocytes Relative 8  3 - 12 (%)    Monocytes Absolute 0.5  0.1 - 1.0 (K/uL)    Eosinophils Relative 0  0 - 5 (%)    Eosinophils Absolute 0.0  0.0 - 0.7 (K/uL)    Basophils Relative 0  0 - 1 (%)    Basophils Absolute 0.0  0.0 - 0.1 (K/uL)     Radiology/Results: No results found.  Anti-infectives: Anti-infectives     Start     Dose/Rate Route Frequency Ordered Stop   06/26/11 0830   ciprofloxacin (CIPRO) IVPB 400 mg  Status:  Discontinued        400 mg 200 mL/hr over 60 Minutes Intravenous Every 12 hours 06/26/11 0829 06/26/11 1136   06/26/11 0615   cefOXitin (MEFOXIN) 1 g in dextrose 5 % 50 mL IVPB  Status:  Discontinued        1 g 100 mL/hr over 30 Minutes Intravenous 60 min pre-op 06/26/11 0603 06/26/11 0829          Assessment/Plan: Problem List: Patient Active Problem List  Diagnoses  . HYPERLIPIDEMIA  . RESTLESS LEG SYNDROME  . ASTHMA, INTRINSIC  . GERD  . OSTEOPENIA  . Chronic cough    Doing well.  UGI this am.  Hopeful discharge tomorrow 1 Day Post-Op    LOS: 1 day   Matt B. Daphine Deutscher, MD, [facility] [phone] beeper [phone]  06/27/2011 8:22 AM

## 2011-06-28 ENCOUNTER — Encounter (HOSPITAL_COMMUNITY): Payer: Self-pay | Admitting: Surgery

## 2011-06-28 MED ORDER — OXYCODONE-ACETAMINOPHEN 5-325 MG/5ML PO SOLN: 10.0000 mL | ORAL | Status: AC | PRN

## 2011-06-28 NOTE — Discharge Summary (Signed)
Physician Discharge Summary  Patient ID: Felicia Cruz MRN: [id number] DOB/AGE: [birth year] 71 y.o.  Admit date: 06/26/2011 Discharge date: 06/28/2011  Admission Diagnoses:  GERD with chronic cough and asthma  Discharge Diagnoses:    Active Problems:  * No active hospital problems. *    Surgery:  Laparoscopic Nissen Fundoplication with repair of hiatus hernia  Discharged Condition: improved  Hospital Course:   Surgery performed and swallow obtained on PD 1 which looked good. Begun on liquids and ready for discharge on PD 2  Consults: none  Significant Diagnostic Studies: UGI looked good after wrap    Discharge Exam: Blood pressure 157/82, pulse 68, temperature 97.8 F (36.6 C), temperature source Oral, resp. rate 20, height 5\' 6"  (1.676 m), weight 172 lb (78.019 kg), SpO2 96.00%.Incisions OK.  No pain  Disposition: Home or Self Care  Discharge Orders    Future Orders Please Complete By Expires   Diet - low sodium heart healthy      Increase activity slowly      Discharge instructions      Comments:   Liquids diet for one week then pureed diet for 4 weeks   No dressing needed        Medication List  As of 06/28/2011  7:51 AM   STOP taking these medications         esomeprazole 40 MG capsule      oxyCODONE-acetaminophen 5-325 MG per tablet         TAKE these medications         aspirin 81 MG EC tablet   Take 81 mg by mouth every evening.      buPROPion 300 MG 24 hr tablet   Commonly known as: WELLBUTRIN XL   Take 300 mg by mouth daily before breakfast.      Calcium 500 MG Chew   Chew 3 tablets by mouth daily.      LORazepam 1 MG tablet   Commonly known as: ATIVAN   TAKE 1 TABLET BY MOUTH THREE TIMES DAILY AS NEEDED      oxyCODONE-acetaminophen 5-325 MG/5ML solution   Commonly known as: ROXICET   Take 10 mLs by mouth every 4 (four) hours as needed.      pramipexole 0.25 MG tablet   Commonly known as: MIRAPEX   Take 0.25 mg by mouth 2 (two) times  daily.      Red Yeast Rice 600 MG Caps   Take 1 capsule by mouth 2 (two) times daily.           Follow-up Information    Follow up with Luretha Murphy B, MD. Schedule an appointment as soon as possible for a visit in 4 weeks.   Contact information:   [employer] [address], [address] [place] [address] [phone]          Signed: Valarie Merino 06/28/2011, 7:51 AM

## 2011-06-28 NOTE — Progress Notes (Signed)
Assessment unchanged. Verbalized understanding of dc instructions. Script x1 given as provided by MD. Pt discharged via foot per request to front entrance to meet awaiting vehicle to carry home. Accompanied by friend and Therapist, music.

## 2011-07-19 DIAGNOSIS — M20019 Mallet finger of unspecified finger(s): Secondary | ICD-10-CM | POA: Diagnosis not present

## 2011-07-19 DIAGNOSIS — M19049 Primary osteoarthritis, unspecified hand: Secondary | ICD-10-CM | POA: Diagnosis not present

## 2011-07-26 ENCOUNTER — Ambulatory Visit (INDEPENDENT_AMBULATORY_CARE_PROVIDER_SITE_OTHER): Payer: Medicare Other | Admitting: Surgery

## 2011-07-26 ENCOUNTER — Encounter (INDEPENDENT_AMBULATORY_CARE_PROVIDER_SITE_OTHER): Payer: Self-pay | Admitting: Surgery

## 2011-07-26 VITALS — BP 124/78 | HR 84 | Temp 97.6°F | Resp 18 | Ht 66.0 in | Wt 169.4 lb

## 2011-07-26 DIAGNOSIS — Z09 Encounter for follow-up examination after completed treatment for conditions other than malignant neoplasm: Secondary | ICD-10-CM

## 2011-07-26 DIAGNOSIS — Z9889 Other specified postprocedural states: Secondary | ICD-10-CM | POA: Insufficient documentation

## 2011-07-26 NOTE — Patient Instructions (Signed)
Advance diet but chew thoroughly

## 2011-07-26 NOTE — Progress Notes (Signed)
Felicia Cruz 71 y.o.  Body mass index is 27.34 kg/(m^2).  Patient Active Problem List  Diagnoses  . HYPERLIPIDEMIA  . RESTLESS LEG SYNDROME  . ASTHMA, INTRINSIC  . OSTEOPENIA  . Chronic cough    Allergies  Allergen Reactions  . Penicillins Hives, Shortness Of Breath and Itching  . Prednisone Hives, Shortness Of Breath and Itching  . Latex Other (See Comments)    Skin peeling (thumb)    Past Surgical History  Procedure Date  . Lumbar disc surgery 2008  . Facial cosmetic surgery     eyes, nose  . Abdominoplasty   . Dilation and curettage of uterus     x 2  . Liposuction   . Surgery for kidney stones   . Right index finger surgery 06/19/11    AT [facility]  . Laparoscopic nissen fundoplication 06/26/2011    Procedure: LAPAROSCOPIC NISSEN FUNDOPLICATION;  Surgeon: Valarie Merino, MD;  Location: [facility] ORS;  Service: General;  Laterality: N/A;  Laparoscopic Nissen repair of hiatal hernia  with lighted bougie   TODD,JEFFREY ALLEN, MD, MD No diagnosis found.  Felicia Cruz is doing well. She is 4 weeks out from her laparoscopic Nissen fundoplication. She does have occasional coughs when she eats yogurt but otherwise is not having reflux and is feeling better. I plan to see her again in 3 months to see she's doing. I have advised her she can go ahead and start advancing back a regular diet but just chew thoroughly.   She is scheduled to see Dr. Tawanna Cooler next week Matt B. Daphine Deutscher, MD, [facility] [phone] beeper [phone]  07/26/2011 4:56 PM

## 2011-07-30 ENCOUNTER — Other Ambulatory Visit: Payer: Self-pay | Admitting: Family Medicine

## 2011-08-14 ENCOUNTER — Other Ambulatory Visit: Payer: Self-pay | Admitting: *Deleted

## 2011-08-14 MED ORDER — PRAMIPEXOLE DIHYDROCHLORIDE 0.25 MG PO TABS: 0.2500 mg | ORAL_TABLET | Freq: Two times a day (BID) | ORAL | Status: DC

## 2011-09-08 DIAGNOSIS — H43819 Vitreous degeneration, unspecified eye: Secondary | ICD-10-CM | POA: Diagnosis not present

## 2011-09-10 ENCOUNTER — Other Ambulatory Visit (HOSPITAL_COMMUNITY)
Admission: RE | Admit: 2011-09-10 | Discharge: 2011-09-10 | Disposition: A | Discharge status: Home / Self Care | Payer: Medicare Other | Source: Ambulatory Visit | Attending: Family Medicine | Admitting: Family Medicine

## 2011-09-10 ENCOUNTER — Encounter: Payer: Self-pay | Admitting: Family Medicine

## 2011-09-10 ENCOUNTER — Ambulatory Visit (INDEPENDENT_AMBULATORY_CARE_PROVIDER_SITE_OTHER): Payer: Medicare Other | Admitting: Family Medicine

## 2011-09-10 VITALS — BP 130/70 | Temp 98.0°F | Ht 67.0 in | Wt 170.0 lb

## 2011-09-10 DIAGNOSIS — F329 Major depressive disorder, single episode, unspecified: Secondary | ICD-10-CM | POA: Diagnosis not present

## 2011-09-10 DIAGNOSIS — Z9889 Other specified postprocedural states: Secondary | ICD-10-CM

## 2011-09-10 DIAGNOSIS — E785 Hyperlipidemia, unspecified: Secondary | ICD-10-CM | POA: Diagnosis not present

## 2011-09-10 DIAGNOSIS — Z01419 Encounter for gynecological examination (general) (routine) without abnormal findings: Secondary | ICD-10-CM

## 2011-09-10 DIAGNOSIS — G2581 Restless legs syndrome: Secondary | ICD-10-CM | POA: Diagnosis not present

## 2011-09-10 DIAGNOSIS — Z124 Encounter for screening for malignant neoplasm of cervix: Secondary | ICD-10-CM | POA: Diagnosis not present

## 2011-09-10 DIAGNOSIS — Z0000 Encounter for general adult medical examination without abnormal findings: Secondary | ICD-10-CM | POA: Diagnosis not present

## 2011-09-10 DIAGNOSIS — M899 Disorder of bone, unspecified: Secondary | ICD-10-CM

## 2011-09-10 DIAGNOSIS — R05 Cough: Secondary | ICD-10-CM | POA: Diagnosis not present

## 2011-09-10 DIAGNOSIS — F32A Depression, unspecified: Secondary | ICD-10-CM | POA: Insufficient documentation

## 2011-09-10 DIAGNOSIS — M949 Disorder of cartilage, unspecified: Secondary | ICD-10-CM

## 2011-09-10 LAB — CBC WITH DIFFERENTIAL/PLATELET
Basophils Absolute: 0 10*3/uL (ref 0.0–0.1)
Eosinophils Absolute: 0.1 10*3/uL (ref 0.0–0.7)
HCT: 35.4 % — ABNORMAL LOW (ref 36.0–46.0)
Lymphs Abs: 2.1 10*3/uL (ref 0.7–4.0)
MCV: 79.4 fl (ref 78.0–100.0)
Monocytes Absolute: 0.4 10*3/uL (ref 0.1–1.0)
Neutrophils Relative %: 56.5 % (ref 43.0–77.0)
Platelets: 405 10*3/uL — ABNORMAL HIGH (ref 150.0–400.0)
RDW: 18.4 % — ABNORMAL HIGH (ref 11.5–14.6)

## 2011-09-10 LAB — HEPATIC FUNCTION PANEL
ALT: 17 U/L (ref 0–35)
AST: 18 U/L (ref 0–37)
Albumin: 4.3 g/dL (ref 3.5–5.2)
Alkaline Phosphatase: 58 U/L (ref 39–117)
Total Bilirubin: 0.3 mg/dL (ref 0.3–1.2)

## 2011-09-10 LAB — POCT URINALYSIS DIPSTICK
Leukocytes, UA: NEGATIVE
Nitrite, UA: NEGATIVE
Protein, UA: NEGATIVE
Urobilinogen, UA: 0.2
pH, UA: 6.5

## 2011-09-10 LAB — BASIC METABOLIC PANEL
BUN: 10 mg/dL (ref 6–23)
Calcium: 9.4 mg/dL (ref 8.4–10.5)
Chloride: 106 mEq/L (ref 96–112)
Creatinine, Ser: 0.5 mg/dL (ref 0.4–1.2)
GFR: 126.5 mL/min (ref >60.00)

## 2011-09-10 LAB — LDL CHOLESTEROL, DIRECT: Direct LDL: 144.6 mg/dL

## 2011-09-10 LAB — LIPID PANEL
Cholesterol: 260 mg/dL — ABNORMAL HIGH (ref 0–200)
HDL: 92.6 mg/dL (ref >39.00)

## 2011-09-10 MED ORDER — LORAZEPAM 1 MG PO TABS: 1.0000 mg | ORAL_TABLET | Freq: Three times a day (TID) | ORAL | Status: DC

## 2011-09-10 MED ORDER — PRAMIPEXOLE DIHYDROCHLORIDE 1 MG PO TABS: 1.0000 mg | ORAL_TABLET | Freq: Three times a day (TID) | ORAL | Status: DC

## 2011-09-10 MED ORDER — WELLBUTRIN XL 300 MG PO TB24: 300.0000 mg | ORAL_TABLET | Freq: Every day | ORAL | Status: DC

## 2011-09-10 NOTE — Patient Instructions (Signed)
Continue your current medications however we will switch to the branded Wellbutrin  Set up a time sometime in the next 4-6 weeks for removal of the 2 lesions we discussed  Return in one year sooner if any problems

## 2011-09-10 NOTE — Progress Notes (Signed)
  Subjective:    Patient ID: Felicia Cruz, female    DOB: 01-22-1941, 71 y.o.   MRN: [id number]  HPI Felicia Cruz is a 71 year old female has been dictated died last year of pancreatic cancer who comes in today for a Medicare wellness examination  She was on the generic Wellbutrin but he didn't help she had side effects would like the branded products  She takes Ativan 1 mg twice a day when necessary for anxiety  She takes Mirapex but like a stronger dose for restless leg syndrome  She takes aspirin and red yeast rice  She gets routine eye care recent right retinal bulge she's currently being seen by the ophthalmologist, normal hearing, regular dental care, colonoscopy normal in GI, she does BSE monthly and gets an annual mammogram, tetanus 2008, shingles 2008, Pneumovax x2  Cognitive function normal she walks on a regular basis home health safety reviewed no issues identified, no guns in the house and she does have a health care power of attorney and living will   Review of Systems  Constitutional: Negative.   HENT: Negative.   Eyes: Negative.   Respiratory: Negative.   Cardiovascular: Negative.   Gastrointestinal: Negative.   Genitourinary: Negative.   Musculoskeletal: Negative.   Neurological: Negative.   Hematological: Negative.   Psychiatric/Behavioral: Negative.        Objective:   Physical Exam  Constitutional: She appears well-developed and well-nourished.  HENT:  Head: Normocephalic and atraumatic.  Right Ear: External ear normal.  Left Ear: External ear normal.  Nose: Nose normal.  Mouth/Throat: Oropharynx is clear and moist.  Eyes: EOM are normal. Pupils are equal, round, and reactive to light.  Neck: Normal range of motion. Neck supple. No thyromegaly present.  Cardiovascular: Normal rate, regular rhythm, normal heart sounds and intact distal pulses.  Exam reveals no gallop and no friction rub.   No murmur heard. Pulmonary/Chest: Effort normal and breath sounds  normal.  Abdominal: Soft. Bowel sounds are normal. She exhibits no distension and no mass. There is no tenderness. There is no rebound.  Genitourinary: Vagina normal and uterus normal. Guaiac negative stool. No vaginal discharge found.  Musculoskeletal: Normal range of motion.  Lymphadenopathy:    She has no cervical adenopathy.  Neurological: She is alert. She has normal reflexes. No cranial nerve deficit. She exhibits normal muscle tone. Coordination normal.  Skin: Skin is warm and dry.       She has light skin and light eyes total body skin exam normal except for 2 abnormal lesions one on her right breast one on her left advised to return for removal  Psychiatric: She has a normal mood and affect. Her behavior is normal. Judgment and thought content normal.          Assessment & Plan:  Healthy female  Mild depression continue Wellbutrin switched to the branded product and Ativan 1 mg twice a day when necessary  Restless leg syndrome continue Mirapex but increase dose to 1 mg each bedtime  2 abnormal lesions return for removal

## 2011-09-16 ENCOUNTER — Telehealth: Payer: Self-pay | Admitting: *Deleted

## 2011-09-16 NOTE — Telephone Encounter (Signed)
Patient would like to know if she should start medication for her cholesterol?

## 2011-09-17 NOTE — Telephone Encounter (Signed)
no

## 2011-09-17 NOTE — Telephone Encounter (Signed)
Left message on machine for patient

## 2011-09-26 DIAGNOSIS — H43819 Vitreous degeneration, unspecified eye: Secondary | ICD-10-CM | POA: Diagnosis not present

## 2011-10-18 ENCOUNTER — Ambulatory Visit (INDEPENDENT_AMBULATORY_CARE_PROVIDER_SITE_OTHER): Payer: Medicare Other | Admitting: Surgery

## 2011-10-18 ENCOUNTER — Other Ambulatory Visit (INDEPENDENT_AMBULATORY_CARE_PROVIDER_SITE_OTHER): Payer: Self-pay | Admitting: General Surgery

## 2011-10-18 ENCOUNTER — Encounter (INDEPENDENT_AMBULATORY_CARE_PROVIDER_SITE_OTHER): Payer: Self-pay | Admitting: Surgery

## 2011-10-18 DIAGNOSIS — R1310 Dysphagia, unspecified: Secondary | ICD-10-CM

## 2011-10-18 NOTE — Patient Instructions (Signed)
Stand when eating to let gravity assist esophageal emptying.

## 2011-10-18 NOTE — Progress Notes (Signed)
Felicia Cruz 71 y.o.  There is no height or weight on file to calculate BMI.  Patient Active Problem List  Diagnoses  . HYPERLIPIDEMIA  . RESTLESS LEG SYNDROME  . ASTHMA, INTRINSIC  . OSTEOPENIA  . Chronic cough  . S/P Nissen fundoplication (without gastrostomy tube) procedure  . Depression    Allergies  Allergen Reactions  . Penicillins Hives, Shortness Of Breath and Itching  . Prednisone Hives, Shortness Of Breath and Itching  . Latex Other (See Comments)    Skin peeling (thumb)    Past Surgical History  Procedure Date  . Lumbar disc surgery 2008  . Facial cosmetic surgery     eyes, nose  . Abdominoplasty   . Dilation and curettage of uterus     x 2  . Liposuction   . Surgery for kidney stones   . Right index finger surgery 06/19/11    AT [facility]  . Laparoscopic nissen fundoplication 06/26/2011    Procedure: LAPAROSCOPIC NISSEN FUNDOPLICATION;  Surgeon: Valarie Merino, MD;  Location: [facility];  Service: General;  Laterality: N/A;  Laparoscopic Nissen repair of hiatal hernia  with lighted bougie   TODD,JEFFREY ALLEN, MD, MD No diagnosis found.  [name] comes in 4 month followup.  She had Laparoscopic repair of hiatal hernia, Nissen fundoplication over a #56 lighted bougie, 2 suture pledgeted posterior repair of hiatus, 3 suture wrap.  She is having significant dysphagia.  No reflux.  Either she is too tight or her peristasis is not good enough.  Will get UGI to assess.  See back after UGI.    Matt B. Daphine Deutscher, MD, [facility] [phone] beeper [phone]  10/18/2011 10:54 AM

## 2011-10-31 ENCOUNTER — Ambulatory Visit
Admission: RE | Admit: 2011-10-31 | Discharge: 2011-10-31 | Disposition: A | Discharge status: Home / Self Care | Payer: Medicare Other | Source: Ambulatory Visit | Attending: Surgery | Admitting: Surgery

## 2011-10-31 DIAGNOSIS — R1310 Dysphagia, unspecified: Secondary | ICD-10-CM | POA: Diagnosis not present

## 2011-10-31 DIAGNOSIS — H2510 Age-related nuclear cataract, unspecified eye: Secondary | ICD-10-CM | POA: Diagnosis not present

## 2011-10-31 DIAGNOSIS — K228 Other specified diseases of esophagus: Secondary | ICD-10-CM | POA: Diagnosis not present

## 2011-10-31 DIAGNOSIS — H43819 Vitreous degeneration, unspecified eye: Secondary | ICD-10-CM | POA: Diagnosis not present

## 2011-11-07 ENCOUNTER — Encounter (INDEPENDENT_AMBULATORY_CARE_PROVIDER_SITE_OTHER): Payer: Self-pay | Admitting: Surgery

## 2011-11-07 ENCOUNTER — Ambulatory Visit (INDEPENDENT_AMBULATORY_CARE_PROVIDER_SITE_OTHER): Payer: Medicare Other | Admitting: Surgery

## 2011-11-07 ENCOUNTER — Other Ambulatory Visit (HOSPITAL_COMMUNITY): Payer: Self-pay | Admitting: Surgery

## 2011-11-07 VITALS — BP 138/80 | HR 70 | Temp 96.9°F | Resp 12 | Ht 66.0 in | Wt 170.0 lb

## 2011-11-07 DIAGNOSIS — R1310 Dysphagia, unspecified: Secondary | ICD-10-CM

## 2011-11-07 NOTE — Progress Notes (Signed)
Ms. Dorsi comes in today in and looked at her upper GI series with her. Her Nissen wrap looks like it's in good position. She still is having incessant coughing when she tries to eat. She also massage her throat and can split up when she is eating. On the upper GI her cricopharyngeus muscle was very prominent. Prep she is having proximal spasm. We'll set up for swallowing function study with speech therapy and pathology to see if she has problems with coordination of the beginning of the swallowing process.  Will see back after the speech pathology evaluation.

## 2011-11-07 NOTE — Patient Instructions (Signed)
Swallow study through speech pathology

## 2011-11-12 ENCOUNTER — Ambulatory Visit (HOSPITAL_COMMUNITY)
Admission: RE | Admit: 2011-11-12 | Discharge: 2011-11-12 | Disposition: A | Discharge status: Home / Self Care | Payer: Medicare Other | Source: Ambulatory Visit | Attending: Surgery | Admitting: Surgery

## 2011-11-12 DIAGNOSIS — R1310 Dysphagia, unspecified: Secondary | ICD-10-CM | POA: Insufficient documentation

## 2011-11-12 NOTE — Procedures (Signed)
Objective Swallowing Evaluation: Modified Barium Swallowing Study  Patient Details  Name: Felicia Cruz MRN: [id number] Date of Birth: 1940/07/09  Today's Date: 11/12/2011 Time:  -     Past Medical History:  Past Medical History  Diagnosis Date  . RLS (restless legs syndrome)   . Esophageal stricture 2010  . Hiatal hernia 2010  . Osteopenia   . Hyperlipemia   . Arthritis   . Kidney stones   . GERD (gastroesophageal reflux disease)   . Substance abuse     RECOVERING ALCOLHOLIC  . Intrinsic asthma, unspecified     9/09 positive Methacholine challege test, normal spirometry at baseline  . Asthma     PT STATES SHE WAS TREATED FOR 2 YRS FOR ASTHMA--BUT NOW HER SX'S HAVE BEEN ATTRIBUTED TO ACID REFLUX / HIATAL HERNIA  . Alcoholism     HAS NOT HAD DRINK SINCE 1995  . Cataract   . 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 Lumbar disc surgery 2008  . Facial cosmetic surgery     eyes, nose  . Abdominoplasty   . Dilation and curettage of uterus     x 2  . Liposuction   . Surgery for kidney stones   . Right index finger surgery 06/19/11    AT [facility]  . Laparoscopic nissen fundoplication 06/26/2011    Procedure: LAPAROSCOPIC NISSEN FUNDOPLICATION;  Surgeon: Valarie Merino, MD;  Location: [facility];  Service: General;  Laterality: N/A;  Laparoscopic Nissen repair of hiatal hernia  with lighted bougie   HPI:  Pt is a 71 year old femla earriving for an outpatient MBS due to complaints of severe coughing after PO intake with occasional regurgitation of POs. Pt reports globus with solid POs, waking in the night coughing. History includes barium swallow on 04/19/11 showing Mild impression upon the upper cervical esophagus by cervical osteophytes, normal pharyngeal swallow, small hiatal hernia and significant GER. Pt underwent Nissan Fundoplication on 06/26/11. Pt reports worsening of cough and  globus following funduplication.      Assessment / Plan / Recommendation Clinical Impression  Dysphagia Diagnosis: Suspected primary esophageal dysphagia Clinical impression: Pt presents with a normal oral and oropharyngeal swallow. No evidence of weakness, cou coughing observed during study. Esophageal sweep revealed apperance of mild stasis after 4 oz of puree and a graham cracker. Barium tablet appeared to lodge at GE junction. After study complete and esophageal precautions offered pt expereinced hard coughing suspicious for GER. Pt may continue current diet, no f/u with SLP needed. Defer to MD for assessment of esophageal function.     Treatment Recommendation  No treatment recommended at this time    Diet Recommendation Regular;Thin liquid   Liquid Administration via: Cup;Straw Medication Administration: Whole meds with liquid Supervision: Patient able to self feed Compensations: Slow rate;Small sips/bites;Follow solids with liquid Postural Changes and/or Swallow Maneuvers: Seated upright 90 degrees;Upright 30-60 min after meal    Other  Recommendations Recommended Consults: Consider GI evaluation (being seen by GI)   Follow Up Recommendations  None    Frequency and Duration        Pertinent Vitals/Pain NA    SLP Swallow Goals     General HPI: Pt is a 71 year old femla earriving for an outpatient MBS due to complaints of severe coughing after PO intake with occasional regurgitation of POs. Pt reports globus with solid POs, waking in the  night coughing. History includes barium swallow on 04/19/11 showing Mild impression upon the upper cervical esophagus by cervical osteophytes, normal pharyngeal swallow, small hiatal hernia and significant GER. Pt underwent Nissan Fundoplication on 06/26/11. Pt reports worsening of cough and globus following funduplication.  Type of Study: Modified Barium Swallowing Study Reason for Referral: Objectively evaluate swallowing function Diet Prior  to this Study: Regular;Thin liquids Temperature Spikes Noted: N/A Respiratory Status: Room air History of Recent Intubation: No Behavior/Cognition: Alert;Cooperative;Pleasant mood Oral Cavity - Dentition: Adequate natural dentition Oral Motor / Sensory Function: Within functional limits Self-Feeding Abilities: Able to feed self Patient Positioning: Upright in chair Baseline Vocal Quality: Clear Volitional Cough: Strong Volitional Swallow: Able to elicit Anatomy: Other (Comment) (Cervical osteophytes at C4/5, C5/6, C6/7 (dx by radiologist)) Pharyngeal Secretions: Not observed secondary MBS    Reason for Referral Objectively evaluate swallowing function   Oral Phase     Pharyngeal Phase Pharyngeal Phase: Within functional limits   Cervical Esophageal Phase Cervical Esophageal Phase: [facility]   Harlon Ditty, MA CCC-SLP [phone]  Felicia Cruz Felicia Cruz 11/12/2011, 11:56 AM

## 2011-11-15 ENCOUNTER — Encounter (INDEPENDENT_AMBULATORY_CARE_PROVIDER_SITE_OTHER): Payer: Self-pay | Admitting: Surgery

## 2011-11-15 ENCOUNTER — Ambulatory Visit (INDEPENDENT_AMBULATORY_CARE_PROVIDER_SITE_OTHER): Payer: Medicare Other | Admitting: Surgery

## 2011-11-15 VITALS — BP 142/76 | HR 62 | Temp 97.6°F | Resp 16 | Ht 66.0 in | Wt 158.5 lb

## 2011-11-15 DIAGNOSIS — K219 Gastro-esophageal reflux disease without esophagitis: Secondary | ICD-10-CM

## 2011-11-15 MED ORDER — METOCLOPRAMIDE HCL 5 MG PO TABS: 5.0000 mg | ORAL_TABLET | Freq: Four times a day (QID) | ORAL | Status: DC

## 2011-11-15 NOTE — Patient Instructions (Signed)
Take Reglan (metaclopramide) this week and let me know if this improves your swallowing and coughing.  Will make a referral to Dr. Lina Sar for dilation.

## 2011-11-15 NOTE — Progress Notes (Signed)
Felicia Cruz 71 y.o.  Body mass index is 25.58 kg/(m^2).  Patient Active Problem List  Diagnosis  . HYPERLIPIDEMIA  . RESTLESS LEG SYNDROME  . ASTHMA, INTRINSIC  . OSTEOPENIA  . Chronic cough  . S/P Nissen fundoplication (without gastrostomy tube) procedure  . Depression    Allergies  Allergen Reactions  . Penicillins Hives, Shortness Of Breath and Itching  . Prednisone Hives, Shortness Of Breath and Itching  . Latex Other (See Comments)    Skin peeling (thumb)    Past Surgical History  Procedure Date  . Lumbar disc surgery 2008  . Facial cosmetic surgery     eyes, nose  . Abdominoplasty   . Dilation and curettage of uterus     x 2  . Liposuction   . Surgery for kidney stones   . Right index finger surgery 06/19/11    AT [facility]  . Laparoscopic nissen fundoplication 06/26/2011    Procedure: LAPAROSCOPIC NISSEN FUNDOPLICATION;  Surgeon: Valarie Merino, MD;  Location: [facility];  Service: General;  Laterality: N/A;  Laparoscopic Nissen repair of hiatal hernia  with lighted bougie   TODD,JEFFREY ALLEN, MD No diagnosis found.  Swallowing study did not show proximal esophageal dysmotility.  Maybe she needs esophageal dilatation and I will refer back to Dr. Juanda Chance.  In the meantime, I will start her on Reglan 5 mg. 30  Min AC and HS.  Informed consent given regarding the potential tardive dyskinesia (rare).  She is still coughing and had an episode of food regurgitation.  Return 2 weeks.   Matt B. Daphine Deutscher, MD, [facility] [phone] beeper [phone]  11/15/2011 9:30 AM

## 2011-12-20 ENCOUNTER — Other Ambulatory Visit: Payer: Self-pay | Admitting: *Deleted

## 2011-12-20 DIAGNOSIS — F329 Major depressive disorder, single episode, unspecified: Secondary | ICD-10-CM

## 2011-12-20 DIAGNOSIS — Z9889 Other specified postprocedural states: Secondary | ICD-10-CM

## 2011-12-20 MED ORDER — LORAZEPAM 1 MG PO TABS: 1.0000 mg | ORAL_TABLET | Freq: Three times a day (TID) | ORAL | Status: DC

## 2011-12-20 MED ORDER — PRAMIPEXOLE DIHYDROCHLORIDE 1 MG PO TABS: 1.0000 mg | ORAL_TABLET | Freq: Two times a day (BID) | ORAL | Status: DC

## 2012-01-20 DIAGNOSIS — H43819 Vitreous degeneration, unspecified eye: Secondary | ICD-10-CM | POA: Diagnosis not present

## 2012-01-22 ENCOUNTER — Ambulatory Visit (INDEPENDENT_AMBULATORY_CARE_PROVIDER_SITE_OTHER): Payer: Medicare Other | Admitting: Surgery

## 2012-01-22 ENCOUNTER — Encounter (INDEPENDENT_AMBULATORY_CARE_PROVIDER_SITE_OTHER): Payer: Self-pay | Admitting: Surgery

## 2012-01-22 VITALS — BP 130/66 | HR 88 | Temp 97.3°F | Resp 20 | Ht 66.0 in | Wt 167.8 lb

## 2012-01-22 DIAGNOSIS — R05 Cough: Secondary | ICD-10-CM

## 2012-01-22 NOTE — Progress Notes (Signed)
Felicia Cruz 71 y.o.  Body mass index is 27.08 kg/(m^2).  Patient Active Problem List  Diagnosis  . HYPERLIPIDEMIA  . RESTLESS LEG SYNDROME  . ASTHMA, INTRINSIC  . OSTEOPENIA  . Chronic cough  . S/P Nissen fundoplication (without gastrostomy tube) procedure  . Depression    Allergies  Allergen Reactions  . Penicillins Hives, Shortness Of Breath and Itching  . Prednisone Hives, Shortness Of Breath and Itching  . Latex Other (See Comments)    Skin peeling (thumb)    Past Surgical History  Procedure Date  . Lumbar disc surgery 2008  . Facial cosmetic surgery     eyes, nose  . Abdominoplasty   . Dilation and curettage of uterus     x 2  . Liposuction   . Surgery for kidney stones   . Right index finger surgery 06/19/11    AT [facility]  . Laparoscopic nissen fundoplication 06/26/2011    Procedure: LAPAROSCOPIC NISSEN FUNDOPLICATION;  Surgeon: Valarie Merino, MD;  Location: [facility];  Service: General;  Laterality: N/A;  Laparoscopic Nissen repair of hiatal hernia  with lighted bougie   TODD,JEFFREY ALLEN, MD No diagnosis found.  Office visit:  Felicia Cruz is not having reflux but continues to have the cough which bothers her early in the morning and at bedtime after eating. I am wondering now whether she has intermittent dysmotility of her esophagus. On one study there appeared to possibly be some dysmotility. I want to repeat a manometry with a new 32 channel manometry but we'll be available in mid-September at [place] [facility]. I would like for her to see Dr. Alonza Smoker in the meantime. She is not on any ACE inhibitors note no other reason for her coughing. She had seen at right for 2 years with a diagnosis of asthma. It may be that he might need to see her again.  Plan esophageal manometry in mid-September with followup by me in late September. I encouraged her to go over to Dr. Tawanna Cooler his 98s he gets back in the office to see if he has any other ideas about the cause  of her chronic cough. Matt B. Daphine Deutscher, MD, [facility] [phone] beeper [phone]  01/22/2012 4:12 PM

## 2012-01-22 NOTE — Patient Instructions (Signed)
Await call from [facility] endoscopy for manometry performance to look at your esophageal motility See Dr. Tawanna Cooler in the next 2 weeks. I will see you back at the end of September.

## 2012-02-17 ENCOUNTER — Encounter (HOSPITAL_COMMUNITY)
Admission: RE | Disposition: A | Discharge status: Home / Self Care | Payer: Self-pay | Source: Ambulatory Visit | Attending: Surgery

## 2012-02-17 ENCOUNTER — Encounter (HOSPITAL_COMMUNITY): Payer: Self-pay | Admitting: *Deleted

## 2012-02-17 ENCOUNTER — Ambulatory Visit (HOSPITAL_COMMUNITY)
Admission: RE | Admit: 2012-02-17 | Discharge: 2012-02-17 | Disposition: A | Discharge status: Home / Self Care | Payer: Medicare Other | Source: Ambulatory Visit | Attending: Surgery | Admitting: Surgery

## 2012-02-17 DIAGNOSIS — R1310 Dysphagia, unspecified: Secondary | ICD-10-CM | POA: Diagnosis not present

## 2012-02-17 DIAGNOSIS — R059 Cough, unspecified: Secondary | ICD-10-CM | POA: Insufficient documentation

## 2012-02-17 DIAGNOSIS — K219 Gastro-esophageal reflux disease without esophagitis: Secondary | ICD-10-CM | POA: Insufficient documentation

## 2012-02-17 DIAGNOSIS — R05 Cough: Secondary | ICD-10-CM | POA: Insufficient documentation

## 2012-02-17 HISTORY — PX: ESOPHAGEAL MANOMETRY: SHX5429

## 2012-02-17 SURGERY — MANOMETRY, ESOPHAGUS

## 2012-02-17 MED ORDER — LIDOCAINE VISCOUS 2 % MT SOLN
OROMUCOSAL | Status: AC
  Filled 2012-02-17: qty 15

## 2012-02-18 ENCOUNTER — Encounter (HOSPITAL_COMMUNITY): Payer: Self-pay | Admitting: Surgery

## 2012-03-04 ENCOUNTER — Encounter (INDEPENDENT_AMBULATORY_CARE_PROVIDER_SITE_OTHER): Payer: Self-pay | Admitting: Surgery

## 2012-03-04 ENCOUNTER — Ambulatory Visit (INDEPENDENT_AMBULATORY_CARE_PROVIDER_SITE_OTHER): Payer: Medicare Other | Admitting: Surgery

## 2012-03-04 VITALS — BP 138/76 | HR 76 | Temp 97.5°F | Resp 18 | Ht 66.0 in | Wt 168.2 lb

## 2012-03-04 DIAGNOSIS — Z9889 Other specified postprocedural states: Secondary | ICD-10-CM

## 2012-03-04 DIAGNOSIS — Z09 Encounter for follow-up examination after completed treatment for conditions other than malignant neoplasm: Secondary | ICD-10-CM

## 2012-03-04 NOTE — Progress Notes (Signed)
Felicia Cruz 71 y.o.  Body mass index is 27.15 kg/(m^2).  Patient Active Problem List  Diagnosis  . HYPERLIPIDEMIA  . RESTLESS LEG SYNDROME  . ASTHMA, INTRINSIC  . OSTEOPENIA  . Chronic cough  . S/P Nissen fundoplication (without gastrostomy tube) procedure  . Depression    Allergies  Allergen Reactions  . Penicillins Hives, Shortness Of Breath and Itching  . Prednisone Hives, Shortness Of Breath and Itching  . Latex Other (See Comments)    Skin peeling (thumb)    Past Surgical History  Procedure Date  . Lumbar disc surgery 2008  . Facial cosmetic surgery     eyes, nose  . Abdominoplasty   . Dilation and curettage of uterus     x 2  . Liposuction   . Surgery for kidney stones   . Right index finger surgery 06/19/11    AT [facility]  . Laparoscopic nissen fundoplication 06/26/2011    Procedure: LAPAROSCOPIC NISSEN FUNDOPLICATION;  Surgeon: Valarie Merino, MD;  Location: [facility];  Service: General;  Laterality: N/A;  Laparoscopic Nissen repair of hiatal hernia  with lighted bougie  . Esophageal manometry 02/17/2012    Procedure: ESOPHAGEAL MANOMETRY (EM);  Surgeon: Valarie Merino, MD;  Location: [facility];  Service: Endoscopy;  Laterality: N/A;   TODD,JEFFREY ALLEN, MD No diagnosis found.  Manometry printed and reviewed.  She appears to fall within normal parameters for this high resolution esophageal motility study.   She has not coughed in a while.  This started in September 01, 2022 after her husband died in Jul 03, 2022.  She was asking about stress playing in to this and I said that this was certainly possible.  Most importantly she is much better.  I will see her again as needed. Matt B. Daphine Deutscher, MD, [facility] [phone] beeper [phone]  03/04/2012 3:40 PM

## 2012-03-04 NOTE — Patient Instructions (Signed)
Thanks for your patience.  If you need further assistance after leaving the office, please call our office and speak with a [facility] nurse.  [phone].  If you want to leave a message for Dr. Reid Regas, please call his office phone at [phone]. 

## 2012-03-11 ENCOUNTER — Other Ambulatory Visit: Payer: Self-pay

## 2012-03-11 DIAGNOSIS — F329 Major depressive disorder, single episode, unspecified: Secondary | ICD-10-CM

## 2012-03-11 MED ORDER — WELLBUTRIN XL 300 MG PO TB24: 300.0000 mg | ORAL_TABLET | Freq: Every day | ORAL | Status: DC

## 2012-03-31 ENCOUNTER — Encounter: Payer: Self-pay | Admitting: Family Medicine

## 2012-03-31 ENCOUNTER — Ambulatory Visit (INDEPENDENT_AMBULATORY_CARE_PROVIDER_SITE_OTHER): Payer: Medicare Other | Admitting: Family Medicine

## 2012-03-31 VITALS — BP 140/80 | Temp 97.9°F | Wt 170.0 lb

## 2012-03-31 DIAGNOSIS — J45909 Unspecified asthma, uncomplicated: Secondary | ICD-10-CM | POA: Diagnosis not present

## 2012-03-31 DIAGNOSIS — M25562 Pain in left knee: Secondary | ICD-10-CM

## 2012-03-31 DIAGNOSIS — Z23 Encounter for immunization: Secondary | ICD-10-CM | POA: Diagnosis not present

## 2012-03-31 DIAGNOSIS — M25569 Pain in unspecified knee: Secondary | ICD-10-CM

## 2012-03-31 DIAGNOSIS — R05 Cough: Secondary | ICD-10-CM

## 2012-03-31 MED ORDER — TRAMADOL HCL 50 MG PO TABS: 50.0000 mg | ORAL_TABLET | Freq: Three times a day (TID) | ORAL | Status: DC | PRN

## 2012-03-31 MED ORDER — BECLOMETHASONE DIPROPIONATE 40 MCG/ACT IN AERS: INHALATION_SPRAY | RESPIRATORY_TRACT | Status: DC

## 2012-03-31 MED ORDER — HYDROCODONE-HOMATROPINE 5-1.5 MG/5ML PO SYRP: 5.0000 mL | ORAL_SOLUTION | Freq: Three times a day (TID) | ORAL | Status: DC | PRN

## 2012-03-31 NOTE — Progress Notes (Signed)
  Subjective:    Patient ID: Felicia Cruz, female    DOB: 04-09-1941, 71 y.o.   MRN: [id number]  HPI Felicia Cruz is a delightful 71 year old widowed female nonsmoker who comes in today for evaluation of 2 problems  I will try to get a synopsis of a very complicated history. We had seen her last year for evaluation of a cough. The indications we tried did not help. We sent her for consult with Dr. Timoteo Ace in pulmonary who work with her for couple months. Subsequently referred her to Dr. Dickie La who found a hiatal hernia. Hiatal hernia surgery by Dr. Daphine Deutscher was done in January and the cough has somewhat abated however in the past month it's recurred. She has no fever chills shortness of breath etc.  A month ago she developed pain in the medial portion of her left knee after her morning walk. She then went to [place] for 2 weeks and does a lot of walking. The pain keeps her awake at night she's noticed swelling and locking. No previous history of trauma  [place] says she had an allergic reaction to prednisone?????????      Review of Systems    general and pulmonary review of systems otherwise negative Objective:   Physical Exam Well-developed well-nourished female no acute distress examination the heart and lungs are perfectly normal . Examination knee shows some palpable tenderness along the medial joint line. There is full range of motion ligaments are intact       Assessment & Plan:  Cough x1 month unknown etiology plan,,,,,,,,, empirically begin an inhaled steroid 2 puffs twice a day, Hydromet 1/2 teaspoon 3 times a day when necessary for cough and omeprazole 20 mg twice a day  Pulmonary consult if symptoms persist  Pain left knee,,,,,,,,,, probable 8 loose body in that left knee with the swelling and locking will refer to Dr. Tonie Griffith.

## 2012-03-31 NOTE — Addendum Note (Signed)
Addended by: Kern Reap B on: 03/31/2012 03:05 PM   Modules accepted: Orders

## 2012-03-31 NOTE — Patient Instructions (Signed)
Drink lots of water  Qvar inhaler........ 2 puffs twice daily  Hydromet,,,,,,,,, 1/2-1 teaspoon 3 times daily for cough  Elevation and ice tramadol,,,,,,,, 1/2-1 tablet 3 times daily as needed for knee pain  Cogging for orthopedics at [phone] and asked to see Dr. Charlann Boxer or frank aluciio

## 2012-04-01 ENCOUNTER — Telehealth: Payer: Self-pay | Admitting: Family Medicine

## 2012-04-01 NOTE — Telephone Encounter (Signed)
Patient is unable to see Dr Cornelius Moras  Until December .  Suggestions please

## 2012-04-01 NOTE — Telephone Encounter (Signed)
Please call the pt. [phone].  Thanks!

## 2012-04-02 NOTE — Telephone Encounter (Signed)
Fleet Contras please call [facility] for [facility]ics.........Marland Kitchen I need an appointment with Dr. Lajoyce Corners ASAP evaluation of left knee pain

## 2012-04-03 NOTE — Telephone Encounter (Signed)
Dr Penni Bombard at 2:15 on Monday.  Patient is aware.

## 2012-04-06 DIAGNOSIS — M23305 Other meniscus derangements, unspecified medial meniscus, unspecified knee: Secondary | ICD-10-CM | POA: Diagnosis not present

## 2012-04-06 DIAGNOSIS — M25569 Pain in unspecified knee: Secondary | ICD-10-CM | POA: Diagnosis not present

## 2012-04-09 ENCOUNTER — Other Ambulatory Visit: Payer: Self-pay | Admitting: Dermatology

## 2012-04-09 DIAGNOSIS — L570 Actinic keratosis: Secondary | ICD-10-CM | POA: Diagnosis not present

## 2012-04-09 DIAGNOSIS — D485 Neoplasm of uncertain behavior of skin: Secondary | ICD-10-CM | POA: Diagnosis not present

## 2012-04-09 DIAGNOSIS — L820 Inflamed seborrheic keratosis: Secondary | ICD-10-CM | POA: Diagnosis not present

## 2012-04-09 DIAGNOSIS — C44611 Basal cell carcinoma of skin of unspecified upper limb, including shoulder: Secondary | ICD-10-CM | POA: Diagnosis not present

## 2012-04-09 DIAGNOSIS — L723 Sebaceous cyst: Secondary | ICD-10-CM | POA: Diagnosis not present

## 2012-04-16 DIAGNOSIS — D3000 Benign neoplasm of unspecified kidney: Secondary | ICD-10-CM | POA: Diagnosis not present

## 2012-04-27 ENCOUNTER — Telehealth: Payer: Self-pay | Admitting: *Deleted

## 2012-04-27 MED ORDER — PANTOPRAZOLE SODIUM 40 MG PO TBEC: 40.0000 mg | DELAYED_RELEASE_TABLET | Freq: Every day | ORAL | Status: DC

## 2012-04-27 NOTE — Telephone Encounter (Signed)
Okay per Dr Tawanna Cooler.  Rx called in and patient is aware

## 2012-04-27 NOTE — Telephone Encounter (Signed)
Patient is requesting a Rx for protonix to help with her throat and cough.  Is this okay ?

## 2012-05-04 DIAGNOSIS — M25569 Pain in unspecified knee: Secondary | ICD-10-CM | POA: Diagnosis not present

## 2012-05-04 DIAGNOSIS — M23305 Other meniscus derangements, unspecified medial meniscus, unspecified knee: Secondary | ICD-10-CM | POA: Diagnosis not present

## 2012-05-06 DIAGNOSIS — Z1231 Encounter for screening mammogram for malignant neoplasm of breast: Secondary | ICD-10-CM | POA: Diagnosis not present

## 2012-05-07 DIAGNOSIS — M25569 Pain in unspecified knee: Secondary | ICD-10-CM | POA: Diagnosis not present

## 2012-05-29 DIAGNOSIS — IMO0002 Reserved for concepts with insufficient information to code with codable children: Secondary | ICD-10-CM | POA: Diagnosis not present

## 2012-06-11 DIAGNOSIS — M9420 Chondromalacia, unspecified site: Secondary | ICD-10-CM | POA: Diagnosis not present

## 2012-06-11 DIAGNOSIS — M948X9 Other specified disorders of cartilage, unspecified sites: Secondary | ICD-10-CM | POA: Diagnosis not present

## 2012-06-11 DIAGNOSIS — M2240 Chondromalacia patellae, unspecified knee: Secondary | ICD-10-CM | POA: Diagnosis not present

## 2012-06-11 DIAGNOSIS — M23305 Other meniscus derangements, unspecified medial meniscus, unspecified knee: Secondary | ICD-10-CM | POA: Diagnosis not present

## 2012-06-22 ENCOUNTER — Ambulatory Visit: Payer: Medicare Other | Attending: Orthopedic Surgery | Admitting: Physical Therapy

## 2012-06-22 DIAGNOSIS — IMO0001 Reserved for inherently not codable concepts without codable children: Secondary | ICD-10-CM | POA: Diagnosis not present

## 2012-06-22 DIAGNOSIS — R262 Difficulty in walking, not elsewhere classified: Secondary | ICD-10-CM | POA: Diagnosis not present

## 2012-06-22 DIAGNOSIS — M25569 Pain in unspecified knee: Secondary | ICD-10-CM | POA: Insufficient documentation

## 2012-06-22 DIAGNOSIS — M25669 Stiffness of unspecified knee, not elsewhere classified: Secondary | ICD-10-CM | POA: Diagnosis not present

## 2012-06-25 ENCOUNTER — Ambulatory Visit: Payer: Medicare Other | Admitting: Physical Therapy

## 2012-06-30 ENCOUNTER — Ambulatory Visit: Payer: Medicare Other | Admitting: Physical Therapy

## 2012-07-02 ENCOUNTER — Ambulatory Visit: Payer: Medicare Other | Admitting: Physical Therapy

## 2012-07-07 ENCOUNTER — Ambulatory Visit: Payer: Medicare Other | Attending: Orthopedic Surgery | Admitting: Physical Therapy

## 2012-07-07 DIAGNOSIS — M25569 Pain in unspecified knee: Secondary | ICD-10-CM | POA: Diagnosis not present

## 2012-07-07 DIAGNOSIS — M25669 Stiffness of unspecified knee, not elsewhere classified: Secondary | ICD-10-CM | POA: Insufficient documentation

## 2012-07-07 DIAGNOSIS — IMO0001 Reserved for inherently not codable concepts without codable children: Secondary | ICD-10-CM | POA: Diagnosis not present

## 2012-07-07 DIAGNOSIS — R262 Difficulty in walking, not elsewhere classified: Secondary | ICD-10-CM | POA: Insufficient documentation

## 2012-07-08 ENCOUNTER — Other Ambulatory Visit: Payer: Self-pay | Admitting: *Deleted

## 2012-07-08 DIAGNOSIS — Z9889 Other specified postprocedural states: Secondary | ICD-10-CM

## 2012-07-08 MED ORDER — PRAMIPEXOLE DIHYDROCHLORIDE 1 MG PO TABS: 1.0000 mg | ORAL_TABLET | Freq: Two times a day (BID) | ORAL | Status: DC

## 2012-07-09 ENCOUNTER — Ambulatory Visit: Payer: Medicare Other | Admitting: Physical Therapy

## 2012-07-09 DIAGNOSIS — IMO0001 Reserved for inherently not codable concepts without codable children: Secondary | ICD-10-CM | POA: Diagnosis not present

## 2012-07-09 DIAGNOSIS — M25569 Pain in unspecified knee: Secondary | ICD-10-CM | POA: Diagnosis not present

## 2012-07-09 DIAGNOSIS — R262 Difficulty in walking, not elsewhere classified: Secondary | ICD-10-CM | POA: Diagnosis not present

## 2012-07-09 DIAGNOSIS — M25669 Stiffness of unspecified knee, not elsewhere classified: Secondary | ICD-10-CM | POA: Diagnosis not present

## 2012-07-13 ENCOUNTER — Ambulatory Visit: Payer: Medicare Other | Admitting: Physical Therapy

## 2012-07-13 DIAGNOSIS — M25569 Pain in unspecified knee: Secondary | ICD-10-CM | POA: Diagnosis not present

## 2012-07-13 DIAGNOSIS — M25669 Stiffness of unspecified knee, not elsewhere classified: Secondary | ICD-10-CM | POA: Diagnosis not present

## 2012-07-13 DIAGNOSIS — R262 Difficulty in walking, not elsewhere classified: Secondary | ICD-10-CM | POA: Diagnosis not present

## 2012-07-13 DIAGNOSIS — IMO0001 Reserved for inherently not codable concepts without codable children: Secondary | ICD-10-CM | POA: Diagnosis not present

## 2012-07-14 ENCOUNTER — Ambulatory Visit: Payer: Medicare Other | Admitting: Physical Therapy

## 2012-07-14 DIAGNOSIS — M25669 Stiffness of unspecified knee, not elsewhere classified: Secondary | ICD-10-CM | POA: Diagnosis not present

## 2012-07-14 DIAGNOSIS — R262 Difficulty in walking, not elsewhere classified: Secondary | ICD-10-CM | POA: Diagnosis not present

## 2012-07-14 DIAGNOSIS — IMO0001 Reserved for inherently not codable concepts without codable children: Secondary | ICD-10-CM | POA: Diagnosis not present

## 2012-07-14 DIAGNOSIS — M25569 Pain in unspecified knee: Secondary | ICD-10-CM | POA: Diagnosis not present

## 2012-07-28 NOTE — Progress Notes (Signed)
Matt-  Need PRE OP ORDERS PLEASE  THANKS

## 2012-08-03 ENCOUNTER — Encounter (HOSPITAL_COMMUNITY): Payer: Self-pay | Admitting: Pharmacy Technician

## 2012-08-03 ENCOUNTER — Ambulatory Visit: Payer: Medicare Other | Admitting: Family Medicine

## 2012-08-04 ENCOUNTER — Encounter: Payer: Medicare Other | Admitting: Family Medicine

## 2012-08-04 ENCOUNTER — Telehealth: Payer: Self-pay | Admitting: Family Medicine

## 2012-08-04 NOTE — Progress Notes (Signed)
Error - late cancel due to weather  This encounter was created in error - please disregard.

## 2012-08-04 NOTE — Telephone Encounter (Signed)
[name], please call pt below and reschedule this appointment. Appointment was with Dr. Selena Batten. Thank you.  [address] [place], [place] [address] p. [phone] f. [phone] To: [facility] (After Hours Triage) Fax: [phone] From: [facility] Date/ Time: 08/04/2012 9:49 AM Taken By: Jeraldine Loots, RN Caller: Okey Regal Facility: not collected Patient: Felicia Cruz, Felicia Cruz DOB: [birth year] Phone: [phone] Reason for Call: See info below Regarding Appointment: Yes Appt Date: 08/04/2012 Appt Time: 10:00:00 AM Felicia Cruz: Felicia Cruz (Family Practice) Reason: Cancel Appointment Details: needs to cancel appt today but has to see and MD this week for preop scheduled for knee surgery on 3/10a. Please call to reschedule. Outcome: Cancelled appointment in [facility]) Confidential

## 2012-08-04 NOTE — Telephone Encounter (Signed)
appt scheduled

## 2012-08-05 ENCOUNTER — Ambulatory Visit (INDEPENDENT_AMBULATORY_CARE_PROVIDER_SITE_OTHER): Payer: Medicare Other | Admitting: Family Medicine

## 2012-08-05 ENCOUNTER — Encounter: Payer: Self-pay | Admitting: Family Medicine

## 2012-08-05 VITALS — BP 136/88 | HR 76 | Temp 97.7°F | Wt 176.0 lb

## 2012-08-05 DIAGNOSIS — Z01810 Encounter for preprocedural cardiovascular examination: Secondary | ICD-10-CM

## 2012-08-05 DIAGNOSIS — Z01818 Encounter for other preprocedural examination: Secondary | ICD-10-CM

## 2012-08-05 NOTE — Progress Notes (Signed)
HPI:  Here for pre-op visit for medical optimization prior to L knee replacement surgery: -she has had many surgeries including lumbar disc surgery, nissen fundoplication, liposuction, abdominoplasty, lithotripsy and more and has had no difficulty with surgery in the past. She has no known CAD, CVD, CHF, diabetes, lung disease or chronic renal disease. Reports she was dx with asthma - but then was told she does not have asthma and symptoms (cough after eating hot meals) were from acid reflux. No PFTs found in chart. Her functional status is good, she reports she can walk up multiple flights of stairs, do strenuous house work, walk multiple block - just hurts her knee - > 4 METS She does take asa daily for general health - not told to do so by a doctor, she hasn't been taking this She is not on any glucocorticoids, diabetic medications or BBs. ROS: See pertinent positives and negatives per HPI. She denies CP, SOB, DOE, orthopnea, changes in bowels, weight changes, bleeding disorders, urinary symptoms and admits to great functional status.  Past Medical History  Diagnosis Date  . RLS (restless legs syndrome)   . Esophageal stricture 2010  . Hiatal hernia 2010  . Osteopenia   . Hyperlipemia   . Arthritis   . Kidney stones   . GERD (gastroesophageal reflux disease)   . Substance abuse     RECOVERING ALCOLHOLIC  . Intrinsic asthma, unspecified     9/09 positive Methacholine challege test, normal spirometry at baseline  . Asthma     PT STATES SHE WAS TREATED FOR 2 YRS FOR ASTHMA--BUT NOW HER SX'S HAVE BEEN ATTRIBUTED TO ACID REFLUX / HIATAL HERNIA  . Alcoholism     HAS NOT HAD DRINK SINCE 1995  . Cataract   . 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 Throat cancer Father     Smoker  . Cancer Father     throat  . Heart disease Mother   . Hypertension Mother   . Hip fracture Mother   . Alzheimer's disease Mother   .  Stroke Mother   . Stroke Brother 55    smoker    History   Social History  . Marital Status: Married    Spouse Name: N/A    Number of Children: 0  . Years of Education: N/A   Occupational History  . Retired    Social History Main Topics  . Smoking status: Former Smoker -- 1.00 packs/day for 10 years    Types: Cigarettes    Quit date: 06/03/1970  . Smokeless tobacco: Never Used     Comment: Passive tobacco smoke exposure history  . Alcohol Use: No     Comment: RECOVERING ALCOLHOLIC  . Drug Use: No  . Sexually Active: Not on file   Other Topics Concern  . Not on file   Social History Narrative   Regular exercise-YES    Current outpatient prescriptions:aspirin 500 MG EC tablet, Take 500 mg by mouth daily., Disp: , Rfl: ;  buPROPion (WELLBUTRIN XL) 300 MG 24 hr tablet, Take 300 mg by mouth every morning., Disp: , Rfl: ;  Calcium 500 MG CHEW, Chew 3 tablets by mouth daily., Disp: , Rfl: ;  Glucosamine HCl 1500 MG TABS, Take by mouth., Disp: , Rfl:  HYDROcodone-homatropine (HYCODAN) 5-1.5 MG/5ML syrup, Take 5 mLs by mouth every 8 (eight) hours as needed for cough., Disp: 240 mL, Rfl: 1;  LORazepam (ATIVAN) 1 MG tablet, Take 1 mg by mouth daily., Disp: , Rfl: ;  meloxicam (MOBIC) 7.5 MG tablet, Take 7.5 mg by mouth daily., Disp: , Rfl: ;  omeprazole (PRILOSEC) 20 MG capsule, Take 20 mg by mouth 2 (two) times daily., Disp: , Rfl:  OVER THE COUNTER MEDICATION, Take 240 mLs by mouth daily. Joint juice, Disp: , Rfl: ;  pantoprazole (PROTONIX) 40 MG tablet, Take 40 mg by mouth daily., Disp: , Rfl: ;  pramipexole (MIRAPEX) 1 MG tablet, Take 1 mg by mouth 2 (two) times daily., Disp: , Rfl:   EXAM: BP: 136/88 SpO2: 98% on RA Pulse: 76 Temp: 97.7 Weight 176 lb  GENERAL: vitals reviewed and listed above, alert, oriented, appears well hydrated and in no acute distress  HEENT: atraumatic, conjunttiva clear, no obvious abnormalities on inspection of external nose and ears  NECK: no  obvious masses on inspection  LUNGS: clear to auscultation bilaterally, no wheezes, rales or rhonchi, good air movement  CV: HRRR, no peripheral edema  MS: moves all extremities without noticeable abnormality  PSYCH: pleasant and cooperative, no obvious depression or anxiety  ASSESSMENT AND PLAN:  Discussed the following assessment and plan:  Pre-op Medical Exam: I advised/discussed the following assessment and recommendations in terms of optimizing this patient for surgery: -advised to stop asa 7 days prior to surgery or per surgeon's advice. -she is low risk for an intermediate risk (1-5% risk of death or MI) surgery which we discussed. Advised her to discuss risks further with her surgeon. -Would advised checking CBC (per review of chart has hx of anemia) and Cr prior to surgery - appears these orders have been placed -will defer to surgeon for DVT prophylaxis and intensive physical therapy following surgery - pt reports she will be going to rehab -would advise pulmonology consult prior to surgery if she should develop any pulmonary symptoms - currently she has sob, wheezing or pulm symptoms and a normal exam -EKG today given age unchanged from previous     KIM, [facility] R.

## 2012-08-06 ENCOUNTER — Encounter (HOSPITAL_COMMUNITY): Payer: Self-pay

## 2012-08-06 ENCOUNTER — Encounter (HOSPITAL_COMMUNITY)
Admission: RE | Admit: 2012-08-06 | Discharge: 2012-08-06 | Disposition: A | Discharge status: Home / Self Care | Payer: Medicare Other | Source: Ambulatory Visit | Attending: Orthopedic Surgery | Admitting: Orthopedic Surgery

## 2012-08-06 ENCOUNTER — Telehealth: Payer: Self-pay | Admitting: Family Medicine

## 2012-08-06 DIAGNOSIS — G2581 Restless legs syndrome: Secondary | ICD-10-CM | POA: Diagnosis not present

## 2012-08-06 DIAGNOSIS — E871 Hypo-osmolality and hyponatremia: Secondary | ICD-10-CM | POA: Diagnosis not present

## 2012-08-06 DIAGNOSIS — R269 Unspecified abnormalities of gait and mobility: Secondary | ICD-10-CM | POA: Diagnosis not present

## 2012-08-06 DIAGNOSIS — M1990 Unspecified osteoarthritis, unspecified site: Secondary | ICD-10-CM | POA: Diagnosis not present

## 2012-08-06 DIAGNOSIS — F1021 Alcohol dependence, in remission: Secondary | ICD-10-CM | POA: Diagnosis not present

## 2012-08-06 DIAGNOSIS — F329 Major depressive disorder, single episode, unspecified: Secondary | ICD-10-CM | POA: Diagnosis not present

## 2012-08-06 DIAGNOSIS — Z6827 Body mass index (BMI) 27.0-27.9, adult: Secondary | ICD-10-CM | POA: Diagnosis not present

## 2012-08-06 DIAGNOSIS — E663 Overweight: Secondary | ICD-10-CM | POA: Diagnosis present

## 2012-08-06 DIAGNOSIS — Z880 Allergy status to penicillin: Secondary | ICD-10-CM | POA: Diagnosis not present

## 2012-08-06 DIAGNOSIS — Z96659 Presence of unspecified artificial knee joint: Secondary | ICD-10-CM | POA: Diagnosis not present

## 2012-08-06 DIAGNOSIS — Z471 Aftercare following joint replacement surgery: Secondary | ICD-10-CM | POA: Diagnosis not present

## 2012-08-06 DIAGNOSIS — M1710 Unilateral primary osteoarthritis, unspecified knee: Secondary | ICD-10-CM | POA: Diagnosis not present

## 2012-08-06 DIAGNOSIS — D649 Anemia, unspecified: Secondary | ICD-10-CM | POA: Diagnosis not present

## 2012-08-06 DIAGNOSIS — J45909 Unspecified asthma, uncomplicated: Secondary | ICD-10-CM | POA: Diagnosis present

## 2012-08-06 DIAGNOSIS — R279 Unspecified lack of coordination: Secondary | ICD-10-CM | POA: Diagnosis not present

## 2012-08-06 DIAGNOSIS — M899 Disorder of bone, unspecified: Secondary | ICD-10-CM | POA: Diagnosis present

## 2012-08-06 DIAGNOSIS — M6281 Muscle weakness (generalized): Secondary | ICD-10-CM | POA: Diagnosis not present

## 2012-08-06 DIAGNOSIS — K2100 Gastro-esophageal reflux disease with esophagitis, without bleeding: Secondary | ICD-10-CM | POA: Diagnosis not present

## 2012-08-06 DIAGNOSIS — IMO0002 Reserved for concepts with insufficient information to code with codable children: Secondary | ICD-10-CM | POA: Diagnosis not present

## 2012-08-06 DIAGNOSIS — E785 Hyperlipidemia, unspecified: Secondary | ICD-10-CM | POA: Diagnosis present

## 2012-08-06 DIAGNOSIS — K219 Gastro-esophageal reflux disease without esophagitis: Secondary | ICD-10-CM | POA: Diagnosis present

## 2012-08-06 DIAGNOSIS — D62 Acute posthemorrhagic anemia: Secondary | ICD-10-CM | POA: Diagnosis not present

## 2012-08-06 DIAGNOSIS — Z87442 Personal history of urinary calculi: Secondary | ICD-10-CM | POA: Diagnosis not present

## 2012-08-06 LAB — BASIC METABOLIC PANEL
BUN: 9 mg/dL (ref 6–23)
Calcium: 9.6 mg/dL (ref 8.4–10.5)
GFR calc non Af Amer: 90 mL/min — ABNORMAL LOW (ref >90)
Glucose, Bld: 86 mg/dL (ref 70–99)
Sodium: 135 mEq/L (ref 135–145)

## 2012-08-06 LAB — URINALYSIS, ROUTINE W REFLEX MICROSCOPIC
Bilirubin Urine: NEGATIVE
Glucose, UA: NEGATIVE mg/dL
Hgb urine dipstick: NEGATIVE
Nitrite: NEGATIVE
Specific Gravity, Urine: 1.011 (ref 1.005–1.030)
pH: 7 (ref 5.0–8.0)

## 2012-08-06 LAB — PROTIME-INR: Prothrombin Time: 13.2 seconds (ref 11.6–15.2)

## 2012-08-06 LAB — SURGICAL PCR SCREEN: Staphylococcus aureus: NEGATIVE

## 2012-08-06 LAB — CBC
MCH: 25 pg — ABNORMAL LOW (ref 26.0–34.0)
MCHC: 32.4 g/dL (ref 30.0–36.0)
Platelets: 503 10*3/uL — ABNORMAL HIGH (ref 150–400)

## 2012-08-06 LAB — ABO/RH: ABO/RH(D): A POS

## 2012-08-06 NOTE — Telephone Encounter (Signed)
[facility] called, they need the surgical clearance paperwork to be completed and faxed back over in order to do the patients surgery. They say they have faxed paperwork several times.  Please advise.  Thank you.

## 2012-08-06 NOTE — Telephone Encounter (Signed)
faxed

## 2012-08-06 NOTE — Patient Instructions (Signed)
20 KASEY HANSELL  08/06/2012   Your procedure is scheduled on: 08-10-2012  Report to [facility] at 0530 AM.  Call this number if you have problems the morning of surgery [phone]   Remember:   Do not eat food or drink liquids :After Midnight.     Take these medicines the morning of surgery with A SIP OF WATER: hydrocodone if needed, pantaprazole, wellbutrin XL,                                 SEE [facility] PREPARING FOR SURGERY SHEET   Do not wear jewelry, make-up or nail polish.  Do not wear lotions, powders, or perfumes. You may wear deodorant.   Men may shave face and neck.  Do not bring valuables to the hospital.  Contacts, dentures or bridgework may not be worn into surgery.  Leave suitcase in the car. After surgery it may be brought to your room.  For patients admitted to the hospital, checkout time is 11:00 AM the day of discharge.   Patients discharged the day of surgery will not be allowed to drive home.  Name and phone number of your driver:  Special Instructions: N/A   Please read over the following fact sheets that you were given: MRSA Information, blood fact sheet, incentive spirometer fact sheet  Call Cain Sieve RN pre op nurse if needed [phone]    FAILURE TO FOLLOW THESE INSTRUCTIONS MAY RESULT IN THE CANCELLATION OF YOUR SURGERY. PATIENT SIGNATURE___________________________________________

## 2012-08-06 NOTE — Progress Notes (Signed)
08-05-2012 medical clearance note dr Tawanna Cooler on chart

## 2012-08-07 NOTE — H&P (Signed)
TOTAL KNEE ADMISSION H&P  Patient is being admitted for left total knee arthroplasty.  Subjective:  Chief Complaint:   Left knee OA / pain.  HPI: Felicia Cruz, 72 y.o. female, has a history of pain and functional disability in the left knee due to arthritis and has failed non-surgical conservative treatments for greater than 12 weeks to includeNSAID's and/or analgesics, corticosteriod injections and activity modification.  Onset of symptoms was abrupt, starting in October of 2013, with rapidlly worsening course since that time. The patient noted prior procedures on the knee to include  arthroscopy on the left knee(s).  Patient currently rates pain in the left knee(s) at 10 out of 10 with activity. Patient has worsening of pain with activity and weight bearing, pain that interferes with activities of daily living, pain with passive range of motion, crepitus and joint swelling.  Patient has evidence of periarticular osteophytes and joint space narrowing by imaging studies. There is no active infection.  Risks, benefits and expectations were discussed with the patient. Patient understand the risks, benefits and expectations and wishes to proceed with surgery.   D/C Plans:    SNF ([facility])  Post-op Meds:    No Rx given  Tranexamic Acid:   To be given  Decadron:    Not to be given - states allergy to prednisone  FYI:   Nothing to note   Patient Active Problem List   Diagnosis Date Noted  . Knee pain, left 03/31/2012  . Depression 09/10/2011  . S/P Nissen fundoplication (without gastrostomy tube) procedure 07/26/2011  . Chronic cough 01/09/2011  . RESTLESS LEG SYNDROME 05/05/2008  . 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 HYPERLIPIDEMIA 09/14/2007  . OSTEOPENIA 09/14/2007   Past Medical History  Diagnosis Date  . RLS (restless legs syndrome)   . Esophageal stricture 2010  . Osteopenia   . Hyperlipemia   . Arthritis   . Kidney stones   . GERD (gastroesophageal reflux disease)   . Substance  abuse     RECOVERING ALCOLHOLIC  . Intrinsic asthma, unspecified     9/09 positive Methacholine challege test, normal spirometry at baseline  . Asthma     PT STATES SHE WAS TREATED FOR 2 YRS FOR ASTHMA--BUT NOW HER SX'S HAVE BEEN ATTRIBUTED TO ACID REFLUX / HIATAL HERNIA  . Alcoholism     HAS NOT HAD DRINK SINCE 1995  . Cataract   . 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 Lumbar disc surgery  2008  . Facial cosmetic surgery      eyes, nose  . Abdominoplasty    . Liposuction  15 to 20 yrs ago  . Surgery for kidney stones    . Right index finger surgery  06/19/11    AT [facility]  . Laparoscopic nissen fundoplication  06/26/2011    Procedure: LAPAROSCOPIC NISSEN FUNDOPLICATION;  Surgeon: Valarie Merino, MD;  Location: [facility] ORS;  Service: General;  Laterality: N/A;  Laparoscopic Nissen repair of hiatal hernia  with lighted bougie  . Esophageal manometry  02/17/2012    Procedure: ESOPHAGEAL MANOMETRY (EM);  Surgeon: Valarie Merino, MD;  Location: [facility] ENDOSCOPY;  Service: Endoscopy;  Laterality: N/A;  . Dilation and curettage of uterus  many yrs ago    x 2    No prescriptions prior to admission   Allergies  Allergen Reactions  . Penicillins Hives, Shortness Of Breath and Itching  . Prednisone Hives,  Shortness Of Breath and Itching  . Latex Other (See Comments)    Skin peeling (thumb)    History  Substance Use Topics  . Smoking status: Former Smoker -- 1.00 packs/day for 10 years    Types: Cigarettes    Quit date: 06/03/1970  . Smokeless tobacco: Never Used     Comment: Passive tobacco smoke exposure history  . Alcohol Use: No     Comment: RECOVERING ALCOLHOLIC    Family History  Problem Relation Age of Onset  . Throat cancer Father     Smoker  . Cancer Father     throat  . Heart disease Mother   . Hypertension Mother   . Hip fracture Mother   . Alzheimer's disease Mother   . Stroke  Mother   . Stroke Brother 19    smoker     Review of Systems  Constitutional: Negative.   HENT: Negative.   Eyes: Negative.   Respiratory: Negative.   Cardiovascular: Negative.   Gastrointestinal: Negative.   Genitourinary: Negative.   Musculoskeletal: Positive for myalgias and joint pain.  Skin: Negative.   Neurological: Negative.   Endo/Heme/Allergies: Negative.   Psychiatric/Behavioral: Negative.     Objective:  Physical Exam  Constitutional: She is oriented to person, place, and time. She appears well-developed and well-nourished.  HENT:  Head: Normocephalic and atraumatic.  Mouth/Throat: Oropharynx is clear and moist.  Eyes: Pupils are equal, round, and reactive to light.  Neck: Neck supple. No JVD present. No tracheal deviation present. No thyromegaly present.  Cardiovascular: Normal rate, regular rhythm, normal heart sounds and intact distal pulses.   Respiratory: Effort normal and breath sounds normal. No stridor. No respiratory distress. She has no wheezes.  GI: Soft. There is no tenderness. There is no guarding.  Lymphadenopathy:    She has no cervical adenopathy.  Neurological: She is alert and oriented to person, place, and time.  Skin: Skin is warm and dry.  Psychiatric: She has a normal mood and affect.    Vital signs in last 24 hours: Temp:  [96.8 F (36 C)] 96.8 F (36 C) (03/06 1340) Pulse Rate:  [89] 89 (03/06 1340) Resp:  [18] 18 (03/06 1340) BP: (162)/(86) 162/86 mmHg (03/06 1340) SpO2:  [100 %] 100 % (03/06 1340) Weight:  [79.289 kg (174 lb 12.8 oz)] 79.289 kg (174 lb 12.8 oz) (03/06 1340)  Labs:   Estimated body mass index is 27.45 kg/(m^2) as calculated from the following:   Height as of 03/04/12: 5\' 6"  (1.676 m).   Weight as of 03/31/12: 77.111 kg (170 lb).   Imaging Review Plain radiographs demonstrate severe degenerative joint disease of the left knee(s). The overall alignment isneutral. The bone quality appears to be good for age and  reported activity level.  Assessment/Plan:  End stage arthritis, left knee   The patient history, physical examination, clinical judgment of the Ashante Snelling and imaging studies are consistent with end stage degenerative joint disease of the left knee(s) and total knee arthroplasty is deemed medically necessary. The treatment options including medical management, injection therapy arthroscopy and arthroplasty were discussed at length. The risks and benefits of total knee arthroplasty were presented and reviewed. The risks due to aseptic loosening, infection, stiffness, patella tracking problems, thromboembolic complications and other imponderables were discussed. The patient acknowledged the explanation, agreed to proceed with the plan and consent was signed. Patient is being admitted for inpatient treatment for surgery, pain control, PT, OT, prophylactic antibiotics, VTE prophylaxis, progressive ambulation and ADL's  and discharge planning. The patient is planning to be discharged to skilled nursing facility.    Anastasio Auerbach Babish   PAC  08/07/2012, 10:25 AM

## 2012-08-09 NOTE — Anesthesia Preprocedure Evaluation (Addendum)
Anesthesia Evaluation  Patient identified by MRN, date of birth, ID band Patient awake    Reviewed: Allergy & Precautions, H&P , NPO status , Patient's Chart, lab work & pertinent test results  Airway Mallampati: II TM Distance: >3 FB Neck ROM: full    Dental  (+) Caps and Dental Advisory Given Most of front teeth are capped:   Pulmonary neg pulmonary ROS,  breath sounds clear to auscultation  Pulmonary exam normal       Cardiovascular Exercise Tolerance: Good negative cardio ROS  Rhythm:regular Rate:Normal     Neuro/Psych Hx. Back surgery - disc negative neurological ROS  negative psych ROS   GI/Hepatic negative GI ROS, Neg liver ROS, GERD-  Medicated and Controlled,  Endo/Other  negative endocrine ROS  Renal/GU negative Renal ROS  negative genitourinary   Musculoskeletal   Abdominal   Peds  Hematology negative hematology ROS (+) Blood dyscrasia, anemia , Hgb. 10.7   Anesthesia Other Findings   Reproductive/Obstetrics negative OB ROS                          Anesthesia Physical Anesthesia Plan  ASA: II  Anesthesia Plan: Spinal   Post-op Pain Management:    Induction:   Airway Management Planned: Simple Face Mask  Additional Equipment:   Intra-op Plan:   Post-operative Plan:   Informed Consent: I have reviewed the patients History and Physical, chart, labs and discussed the procedure including the risks, benefits and alternatives for the proposed anesthesia with the patient or authorized representative who has indicated his/her understanding and acceptance.   Dental Advisory Given  Plan Discussed with: CRNA and Surgeon  Anesthesia Plan Comments:         Anesthesia Quick Evaluation

## 2012-08-10 ENCOUNTER — Inpatient Hospital Stay (HOSPITAL_COMMUNITY): Payer: Medicare Other | Admitting: Anesthesiology

## 2012-08-10 ENCOUNTER — Encounter (HOSPITAL_COMMUNITY)
Admission: RE | Disposition: A | Discharge status: Skilled Nursing Facility | Payer: Self-pay | Source: Ambulatory Visit | Attending: Orthopedic Surgery

## 2012-08-10 ENCOUNTER — Encounter (HOSPITAL_COMMUNITY): Payer: Self-pay | Admitting: Anesthesiology

## 2012-08-10 ENCOUNTER — Encounter (HOSPITAL_COMMUNITY): Payer: Self-pay | Admitting: *Deleted

## 2012-08-10 ENCOUNTER — Inpatient Hospital Stay (HOSPITAL_COMMUNITY)
Admission: RE | Admit: 2012-08-10 | Discharge: 2012-08-13 | DRG: 470 | Disposition: A | Discharge status: Skilled Nursing Facility | Payer: Medicare Other | Source: Ambulatory Visit | Attending: Orthopedic Surgery | Admitting: Orthopedic Surgery

## 2012-08-10 DIAGNOSIS — G2581 Restless legs syndrome: Secondary | ICD-10-CM | POA: Diagnosis not present

## 2012-08-10 DIAGNOSIS — M1710 Unilateral primary osteoarthritis, unspecified knee: Principal | ICD-10-CM | POA: Diagnosis present

## 2012-08-10 DIAGNOSIS — E785 Hyperlipidemia, unspecified: Secondary | ICD-10-CM | POA: Diagnosis present

## 2012-08-10 DIAGNOSIS — K219 Gastro-esophageal reflux disease without esophagitis: Secondary | ICD-10-CM | POA: Diagnosis present

## 2012-08-10 DIAGNOSIS — M899 Disorder of bone, unspecified: Secondary | ICD-10-CM | POA: Diagnosis present

## 2012-08-10 DIAGNOSIS — Z6832 Body mass index (BMI) 32.0-32.9, adult: Secondary | ICD-10-CM

## 2012-08-10 DIAGNOSIS — Z87442 Personal history of urinary calculi: Secondary | ICD-10-CM

## 2012-08-10 DIAGNOSIS — D62 Acute posthemorrhagic anemia: Secondary | ICD-10-CM | POA: Diagnosis not present

## 2012-08-10 DIAGNOSIS — Z96652 Presence of left artificial knee joint: Secondary | ICD-10-CM

## 2012-08-10 DIAGNOSIS — E663 Overweight: Secondary | ICD-10-CM | POA: Diagnosis present

## 2012-08-10 DIAGNOSIS — Z880 Allergy status to penicillin: Secondary | ICD-10-CM

## 2012-08-10 DIAGNOSIS — E871 Hypo-osmolality and hyponatremia: Secondary | ICD-10-CM | POA: Diagnosis not present

## 2012-08-10 DIAGNOSIS — Z96659 Presence of unspecified artificial knee joint: Secondary | ICD-10-CM

## 2012-08-10 DIAGNOSIS — J45909 Unspecified asthma, uncomplicated: Secondary | ICD-10-CM | POA: Diagnosis present

## 2012-08-10 DIAGNOSIS — Z6827 Body mass index (BMI) 27.0-27.9, adult: Secondary | ICD-10-CM

## 2012-08-10 HISTORY — PX: TOTAL KNEE ARTHROPLASTY: SHX125

## 2012-08-10 SURGERY — ARTHROPLASTY, KNEE, TOTAL
Anesthesia: Spinal | Site: Knee | Laterality: Left | Wound class: Clean

## 2012-08-10 MED ORDER — MENTHOL 3 MG MT LOZG
1.0000 | LOZENGE | OROMUCOSAL | Status: DC | PRN
  Administered 2012-08-12: 3 mg via ORAL
  Filled 2012-08-10 (×2): qty 9

## 2012-08-10 MED ORDER — METHOCARBAMOL 500 MG PO TABS
500.0000 mg | ORAL_TABLET | Freq: Four times a day (QID) | ORAL | Status: DC | PRN
  Administered 2012-08-11 – 2012-08-12 (×4): 500 mg via ORAL
  Filled 2012-08-10 (×4): qty 1

## 2012-08-10 MED ORDER — HYDROMORPHONE HCL PF 1 MG/ML IJ SOLN
0.5000 mg | INTRAMUSCULAR | Status: DC | PRN
  Administered 2012-08-10 (×2): 1 mg via INTRAVENOUS
  Filled 2012-08-10 (×2): qty 1

## 2012-08-10 MED ORDER — BISACODYL 10 MG RE SUPP: 10.0000 mg | Freq: Every day | RECTAL | Status: DC | PRN

## 2012-08-10 MED ORDER — BUPROPION HCL ER (XL) 300 MG PO TB24
300.0000 mg | ORAL_TABLET | Freq: Every morning | ORAL | Status: DC
  Administered 2012-08-11 – 2012-08-13 (×3): 300 mg via ORAL
  Filled 2012-08-10 (×3): qty 1

## 2012-08-10 MED ORDER — SODIUM CHLORIDE 0.9 % IJ SOLN
INTRAMUSCULAR | Status: DC | PRN
  Administered 2012-08-10: 50 mL via INTRAVENOUS

## 2012-08-10 MED ORDER — FERROUS SULFATE 325 (65 FE) MG PO TABS
325.0000 mg | ORAL_TABLET | Freq: Three times a day (TID) | ORAL | Status: DC
  Administered 2012-08-10 – 2012-08-13 (×9): 325 mg via ORAL
  Filled 2012-08-10 (×12): qty 1

## 2012-08-10 MED ORDER — DOCUSATE SODIUM 100 MG PO CAPS
100.0000 mg | ORAL_CAPSULE | Freq: Two times a day (BID) | ORAL | Status: DC
  Administered 2012-08-10 – 2012-08-13 (×7): 100 mg via ORAL

## 2012-08-10 MED ORDER — POTASSIUM CHLORIDE 2 MEQ/ML IV SOLN
INTRAVENOUS | Status: DC
  Administered 2012-08-10 – 2012-08-11 (×2): via INTRAVENOUS
  Filled 2012-08-10 (×11): qty 1000

## 2012-08-10 MED ORDER — LORAZEPAM 1 MG PO TABS
1.0000 mg | ORAL_TABLET | Freq: Every evening | ORAL | Status: DC
Start: 2012-08-10 — End: 2012-08-13
  Administered 2012-08-10 – 2012-08-12 (×3): 1 mg via ORAL
  Filled 2012-08-10 (×3): qty 1

## 2012-08-10 MED ORDER — LACTATED RINGERS IV SOLN: INTRAVENOUS | Status: DC

## 2012-08-10 MED ORDER — PRAMIPEXOLE DIHYDROCHLORIDE 1 MG PO TABS
1.0000 mg | ORAL_TABLET | Freq: Two times a day (BID) | ORAL | Status: DC
  Administered 2012-08-10 – 2012-08-13 (×6): 1 mg via ORAL
  Filled 2012-08-10 (×7): qty 1

## 2012-08-10 MED ORDER — PHENOL 1.4 % MT LIQD
1.0000 | OROMUCOSAL | Status: DC | PRN
  Filled 2012-08-10: qty 177

## 2012-08-10 MED ORDER — CLINDAMYCIN PHOSPHATE 600 MG/50ML IV SOLN
600.0000 mg | Freq: Four times a day (QID) | INTRAVENOUS | Status: AC
  Administered 2012-08-10 (×2): 600 mg via INTRAVENOUS
  Filled 2012-08-10 (×2): qty 50

## 2012-08-10 MED ORDER — ALUM & MAG HYDROXIDE-SIMETH 200-200-20 MG/5ML PO SUSP: 30.0000 mL | ORAL | Status: DC | PRN

## 2012-08-10 MED ORDER — POLYETHYLENE GLYCOL 3350 17 G PO PACK
17.0000 g | PACK | Freq: Two times a day (BID) | ORAL | Status: DC
  Administered 2012-08-10 – 2012-08-13 (×6): 17 g via ORAL

## 2012-08-10 MED ORDER — KETOROLAC TROMETHAMINE 15 MG/ML IJ SOLN
7.5000 mg | Freq: Four times a day (QID) | INTRAMUSCULAR | Status: AC
  Administered 2012-08-10 – 2012-08-11 (×4): 7.5 mg via INTRAVENOUS
  Filled 2012-08-10 (×4): qty 1

## 2012-08-10 MED ORDER — ONDANSETRON HCL 4 MG PO TABS: 4.0000 mg | ORAL_TABLET | Freq: Four times a day (QID) | ORAL | Status: DC | PRN

## 2012-08-10 MED ORDER — BUPIVACAINE IN DEXTROSE 0.75-8.25 % IT SOLN
INTRATHECAL | Status: DC | PRN
  Administered 2012-08-10: 15 mg via INTRATHECAL

## 2012-08-10 MED ORDER — RIVAROXABAN 10 MG PO TABS
10.0000 mg | ORAL_TABLET | ORAL | Status: DC
  Administered 2012-08-11 – 2012-08-13 (×3): 10 mg via ORAL
  Filled 2012-08-10 (×4): qty 1

## 2012-08-10 MED ORDER — 0.9 % SODIUM CHLORIDE (POUR BTL) OPTIME
TOPICAL | Status: DC | PRN
  Administered 2012-08-10: 1000 mL

## 2012-08-10 MED ORDER — METHOCARBAMOL 100 MG/ML IJ SOLN: 500.0000 mg | Freq: Four times a day (QID) | INTRAVENOUS | Status: DC | PRN

## 2012-08-10 MED ORDER — CLINDAMYCIN PHOSPHATE 900 MG/50ML IV SOLN
900.0000 mg | INTRAVENOUS | Status: AC
  Administered 2012-08-10: 900 mg via INTRAVENOUS

## 2012-08-10 MED ORDER — ACETAMINOPHEN 10 MG/ML IV SOLN
INTRAVENOUS | Status: DC | PRN
  Administered 2012-08-10: 1000 mg via INTRAVENOUS

## 2012-08-10 MED ORDER — TRANEXAMIC ACID 100 MG/ML IV SOLN
1000.0000 mg | Freq: Once | INTRAVENOUS | Status: AC
  Administered 2012-08-10: 1000 mg via INTRAVENOUS
  Filled 2012-08-10: qty 10

## 2012-08-10 MED ORDER — LACTATED RINGERS IV SOLN
INTRAVENOUS | Status: DC | PRN
  Administered 2012-08-10 (×4): via INTRAVENOUS

## 2012-08-10 MED ORDER — FENTANYL CITRATE 0.05 MG/ML IJ SOLN
INTRAMUSCULAR | Status: DC | PRN
  Administered 2012-08-10: 50 ug via INTRAVENOUS

## 2012-08-10 MED ORDER — MIDAZOLAM HCL 5 MG/5ML IJ SOLN
INTRAMUSCULAR | Status: DC | PRN
  Administered 2012-08-10: 2 mg via INTRAVENOUS

## 2012-08-10 MED ORDER — ZOLPIDEM TARTRATE 5 MG PO TABS: 5.0000 mg | ORAL_TABLET | Freq: Every evening | ORAL | Status: DC | PRN

## 2012-08-10 MED ORDER — HYDROMORPHONE HCL PF 1 MG/ML IJ SOLN: 0.2500 mg | INTRAMUSCULAR | Status: DC | PRN

## 2012-08-10 MED ORDER — ONDANSETRON HCL 4 MG/2ML IJ SOLN
4.0000 mg | Freq: Four times a day (QID) | INTRAMUSCULAR | Status: DC | PRN
  Administered 2012-08-11: 4 mg via INTRAVENOUS
  Filled 2012-08-10: qty 2

## 2012-08-10 MED ORDER — PROPOFOL 10 MG/ML IV BOLUS
INTRAVENOUS | Status: DC | PRN
  Administered 2012-08-10: 30 mg via INTRAVENOUS

## 2012-08-10 MED ORDER — HYDROMORPHONE HCL PF 1 MG/ML IJ SOLN
INTRAMUSCULAR | Status: AC
  Administered 2012-08-10: 0.5 mg via INTRAVENOUS
  Filled 2012-08-10: qty 1

## 2012-08-10 MED ORDER — PROPOFOL INFUSION 10 MG/ML OPTIME
INTRAVENOUS | Status: DC | PRN
  Administered 2012-08-10: 180 ug/kg/min via INTRAVENOUS

## 2012-08-10 MED ORDER — HYDROCODONE-ACETAMINOPHEN 7.5-325 MG PO TABS
1.0000 | ORAL_TABLET | ORAL | Status: DC
  Administered 2012-08-10: 1 via ORAL
  Administered 2012-08-10 – 2012-08-11 (×6): 2 via ORAL
  Filled 2012-08-10 (×8): qty 2

## 2012-08-10 MED ORDER — BUPIVACAINE LIPOSOME 1.3 % IJ SUSP
20.0000 mL | Freq: Once | INTRAMUSCULAR | Status: AC
  Administered 2012-08-10: 20 mL
  Filled 2012-08-10: qty 20

## 2012-08-10 MED ORDER — FLEET ENEMA 7-19 GM/118ML RE ENEM: 1.0000 | ENEMA | Freq: Once | RECTAL | Status: AC | PRN

## 2012-08-10 MED ORDER — DIPHENHYDRAMINE HCL 25 MG PO CAPS: 25.0000 mg | ORAL_CAPSULE | Freq: Four times a day (QID) | ORAL | Status: DC | PRN

## 2012-08-10 MED ORDER — PANTOPRAZOLE SODIUM 40 MG PO TBEC
40.0000 mg | DELAYED_RELEASE_TABLET | Freq: Two times a day (BID) | ORAL | Status: DC
  Administered 2012-08-10 – 2012-08-13 (×7): 40 mg via ORAL
  Filled 2012-08-10 (×9): qty 1

## 2012-08-10 SURGICAL SUPPLY — 59 items
ADH SKN CLS APL DERMABOND .7 (GAUZE/BANDAGES/DRESSINGS) ×1
BAG SPEC THK2 15X12 ZIP CLS (MISCELLANEOUS) ×1
BAG ZIPLOCK 12X15 (MISCELLANEOUS) ×2 IMPLANT
BANDAGE ELASTIC 6 VELCRO ST LF (GAUZE/BANDAGES/DRESSINGS) ×2 IMPLANT
BANDAGE ESMARK 6X9 LF (GAUZE/BANDAGES/DRESSINGS) ×1 IMPLANT
BLADE SAW SGTL 13.0X1.19X90.0M (BLADE) ×2 IMPLANT
BNDG CMPR 9X6 STRL LF SNTH (GAUZE/BANDAGES/DRESSINGS) ×1
BNDG ESMARK 6X9 LF (GAUZE/BANDAGES/DRESSINGS) ×2
BOWL SMART MIX CTS (DISPOSABLE) ×2 IMPLANT
CEMENT HV SMART SET (Cement) ×2 IMPLANT
CLOTH BEACON ORANGE TIMEOUT ST (SAFETY) ×2 IMPLANT
CUFF TOURN SGL QUICK 34 (TOURNIQUET CUFF) ×2
CUFF TRNQT CYL 34X4X40X1 (TOURNIQUET CUFF) ×1 IMPLANT
DECANTER SPIKE VIAL GLASS SM (MISCELLANEOUS) ×2 IMPLANT
DERMABOND ADVANCED (GAUZE/BANDAGES/DRESSINGS) ×1
DERMABOND ADVANCED .7 DNX12 (GAUZE/BANDAGES/DRESSINGS) ×1 IMPLANT
DRAPE EXTREMITY T 121X128X90 (DRAPE) ×2 IMPLANT
DRAPE POUCH INSTRU U-SHP 10X18 (DRAPES) ×2 IMPLANT
DRAPE U-SHAPE 47X51 STRL (DRAPES) ×2 IMPLANT
DRSG AQUACEL AG ADV 3.5X10 (GAUZE/BANDAGES/DRESSINGS) ×2 IMPLANT
DRSG TEGADERM 4X4.75 (GAUZE/BANDAGES/DRESSINGS) ×2 IMPLANT
DURAPREP 26ML APPLICATOR (WOUND CARE) ×2 IMPLANT
ELECT REM PT RETURN 9FT ADLT (ELECTROSURGICAL) ×2
ELECTRODE REM PT RTRN 9FT ADLT (ELECTROSURGICAL) ×1 IMPLANT
EVACUATOR 1/8 PVC DRAIN (DRAIN) ×2 IMPLANT
FACESHIELD LNG OPTICON STERILE (SAFETY) ×10 IMPLANT
GAUZE SPONGE 2X2 8PLY STRL LF (GAUZE/BANDAGES/DRESSINGS) ×1 IMPLANT
GLOVE BIOGEL PI IND STRL 7.5 (GLOVE) ×1 IMPLANT
GLOVE BIOGEL PI IND STRL 8 (GLOVE) ×1 IMPLANT
GLOVE BIOGEL PI INDICATOR 7.5 (GLOVE) ×1
GLOVE BIOGEL PI INDICATOR 8 (GLOVE) ×1
GLOVE ECLIPSE 8.0 STRL XLNG CF (GLOVE) ×2 IMPLANT
GLOVE ORTHO TXT STRL SZ7.5 (GLOVE) ×4 IMPLANT
GOWN BRE IMP PREV XXLGXLNG (GOWN DISPOSABLE) ×2 IMPLANT
GOWN STRL NON-REIN LRG LVL3 (GOWN DISPOSABLE) ×2 IMPLANT
HANDPIECE INTERPULSE COAX TIP (DISPOSABLE) ×2
IMMOBILIZER KNEE 20 (SOFTGOODS)
IMMOBILIZER KNEE 20 THIGH 36 (SOFTGOODS) IMPLANT
KIT BASIN OR (CUSTOM PROCEDURE TRAY) ×2 IMPLANT
MANIFOLD NEPTUNE II (INSTRUMENTS) ×2 IMPLANT
NDL SAFETY ECLIPSE 18X1.5 (NEEDLE) ×1 IMPLANT
NEEDLE HYPO 18GX1.5 SHARP (NEEDLE) ×2
NS IRRIG 1000ML POUR BTL (IV SOLUTION) ×4 IMPLANT
PACK TOTAL JOINT (CUSTOM PROCEDURE TRAY) ×2 IMPLANT
POSITIONER SURGICAL ARM (MISCELLANEOUS) ×2 IMPLANT
SET HNDPC FAN SPRY TIP SCT (DISPOSABLE) ×1 IMPLANT
SET PAD KNEE POSITIONER (MISCELLANEOUS) ×2 IMPLANT
SPONGE GAUZE 2X2 STER 10/PKG (GAUZE/BANDAGES/DRESSINGS) ×1
SUCTION FRAZIER 12FR DISP (SUCTIONS) ×2 IMPLANT
SUT MNCRL AB 4-0 PS2 18 (SUTURE) ×2 IMPLANT
SUT VIC AB 1 CT1 36 (SUTURE) ×2 IMPLANT
SUT VIC AB 2-0 CT1 27 (SUTURE) ×6
SUT VIC AB 2-0 CT1 TAPERPNT 27 (SUTURE) ×3 IMPLANT
SUT VLOC 180 0 24IN GS25 (SUTURE) ×2 IMPLANT
SYR 50ML LL SCALE MARK (SYRINGE) ×2 IMPLANT
TOWEL OR 17X26 10 PK STRL BLUE (TOWEL DISPOSABLE) ×4 IMPLANT
TRAY FOLEY CATH 14FRSI W/METER (CATHETERS) ×2 IMPLANT
WATER STERILE IRR 1500ML POUR (IV SOLUTION) ×2 IMPLANT
WRAP KNEE MAXI GEL POST OP (GAUZE/BANDAGES/DRESSINGS) ×2 IMPLANT

## 2012-08-10 NOTE — Interval H&P Note (Signed)
History and Physical Interval Note:  08/10/2012 7:27 AM  Felicia Cruz  has presented today for surgery, with the diagnosis of LEFT KNEE OA  The various methods of treatment have been discussed with the patient and family. After consideration of risks, benefits and other options for treatment, the patient has consented to  Procedure(s): TOTAL KNEE ARTHROPLASTY (Left) as a surgical intervention .  The patient's history has been reviewed, patient examined, no change in status, stable for surgery.  I have reviewed the patient's chart and labs.  Questions were answered to the patient's satisfaction.     Shelda Pal

## 2012-08-10 NOTE — Op Note (Signed)
NAME:  Felicia Cruz                      MEDICAL RECORD NO.:  [id number]                             FACILITY:  [facility]      PHYSICIAN:  Madlyn Frankel. Charlann Boxer, M.D.  DATE OF BIRTH:  [birth year]      DATE OF PROCEDURE:  08/10/2012                                     OPERATIVE REPORT         PREOPERATIVE DIAGNOSIS:  Left knee osteoarthritis.      POSTOPERATIVE DIAGNOSIS:  Left knee osteoarthritis.      FINDINGS:  The patient was noted to have complete loss of cartilage and   bone-on-bone arthritis with associated osteophytes in the medial and patellofemoral compartments of   the knee with a significant synovitis and associated effusion.      PROCEDURE:  Left total knee replacement.      COMPONENTS USED:  DePuy rotating platform posterior stabilized knee   system, a size 4N femur, 3 tibia, 10 mm PS insert, and 38 patellar   button.      SURGEON:  Madlyn Frankel. Charlann Boxer, M.D.      ASSISTANT:  Lanney Gins, PA-C.      ANESTHESIA:  Spinal.      SPECIMENS:  None.      COMPLICATION:  None.      DRAINS:  One Hemovac.  EBL: <150cc      TOURNIQUET TIME:   Total Tourniquet Time Documented: Thigh (Left) - 30 minutes Total: Thigh (Left) - 30 minutes  .      The patient was stable to the recovery room.      INDICATION FOR PROCEDURE:  Felicia Cruz is a 72 y.o. female patient of   mine.  The patient had been seen, evaluated, and treated conservatively in the   office with medication, activity modification, and injections.  The patient had   radiographic changes of bone-on-bone arthritis with endplate sclerosis and osteophytes noted.      The patient failed conservative measures including medication, injections, and activity modification, and at this point was ready for more definitive measures.   Based on the radiographic changes and failed conservative measures, the patient   decided to proceed with total knee replacement.  Risks of infection,   DVT, component failure, need for revision  surgery, postop course, and   expectations were all   discussed and reviewed.  Consent was obtained for benefit of pain   relief.      PROCEDURE IN DETAIL:  The patient was brought to the operative theater.   Once adequate anesthesia, preoperative antibiotics, 900 mg of Clindamycin administered, the patient was positioned supine with the left thigh tourniquet placed.  The  left lower extremity was prepped and draped in sterile fashion.  A time-   out was performed identifying the patient, planned procedure, and   extremity.      The left lower extremity was placed in the [facility] leg holder.  The leg was   exsanguinated, tourniquet elevated to 250 mmHg.  A midline incision was   made followed by median parapatellar arthrotomy.  Following initial   exposure, attention was  first directed to the patella.  Precut   measurement was noted to be 24 mm.  I resected down to 14 mm and used a   38 patellar button to restore patellar height as well as cover the cut   surface.      The lug holes were drilled and a metal shim was placed to protect the   patella from retractors and saw blades.      At this point, attention was now directed to the femur.  The femoral   canal was opened with a drill, irrigated to try to prevent fat emboli.  An   intramedullary rod was passed at 3 degrees valgus, 10 mm of bone was   resected off the distal femur.  Following this resection, the tibia was   subluxated anteriorly.  Using the extramedullary guide, 10 mm of bone was resected off   the proximal lateral tibia.  We confirmed the gap would be   stable medially and laterally with a 10 mm insert as well as confirmed   the cut was perpendicular in the coronal plane, checking with an alignment rod.      Once this was done, I sized the femur to be a size 4 in the anterior-   posterior dimension, chose a narrow component based on medial and   lateral dimension.  The size 4 rotation block was then pinned in   position  anterior referenced using the C-clamp to set rotation.  The   anterior, posterior, and  chamfer cuts were made without difficulty nor   notching making certain that I was along the anterior cortex to help   with flexion gap stability.      The final box cut was made off the lateral aspect of distal femur.      At this point, the tibia was sized to be a size 3, the size 3 tray was   then pinned in position through the medial third of the tubercle,   drilled, and keel punched.  Trial reduction was now carried with a 4N femur,  3 tibia, a 10 mm insert, and the 38 patella botton.  The knee was brought to   extension, full extension with good flexion stability with the patella   tracking through the trochlea without application of pressure.  Given   all these findings, the trial components removed.  Final components were   opened and cement was mixed.  The knee was irrigated with normal saline   solution and pulse lavage.  The synovial lining was   then injected with 0.25% Marcaine with epinephrine and 1 cc of Toradol,   total of 61 cc.      The knee was irrigated.  Final implants were then cemented onto clean and   dried cut surfaces of bone with the knee brought to extension with a 10 mm trial insert.      Once the cement had fully cured, the excess cement was removed   throughout the knee.  I confirmed I was satisfied with the range of   motion and stability, and the final 10 mm PS insert was chosen.  It was   placed into the knee.      The tourniquet had been let down at 29 minutes.  No significant   hemostasis required.  The medium Hemovac drain was placed deep.  The   extensor mechanism was then reapproximated using #1 Vicryl with the knee   in flexion.  The  remaining wound was closed with 2-0 Vicryl and running 4-0 Monocryl.   The knee was cleaned, dried, dressed sterilely using Dermabond and   Aquacel dressing.  Drain site dressed separately.  The patient was then   brought to  recovery room in stable condition, tolerating the procedure   well.   Please note that Physician Assistant, Lanney Gins, P-C, was present for the entirety of the case, and was utilized for pre-operative positioning, peri-operative retractor management, general facilitation of the procedure.  He was also utilized for primary wound closure at the end of the case.              Madlyn Frankel Charlann Boxer, M.D.

## 2012-08-10 NOTE — Progress Notes (Signed)
Utilization review completed.  

## 2012-08-10 NOTE — Transfer of Care (Signed)
Immediate Anesthesia Transfer of Care Note  Patient: Felicia Cruz  Procedure(s) Performed: Procedure(s): TOTAL KNEE ARTHROPLASTY (Left)  Patient Location: PACU  Anesthesia Type:Regional and Spinal  Level of Consciousness: awake, alert , oriented and patient cooperative  Airway & Oxygen Therapy: Patient Spontanous Breathing and Patient connected to face mask oxygen  Post-op Assessment: Report given to PACU RN and Post -op Vital signs reviewed and stable  Post vital signs: Reviewed and stable  Complications: No apparent anesthesia complications

## 2012-08-10 NOTE — Progress Notes (Signed)
Clinical Social Work Department BRIEF PSYCHOSOCIAL ASSESSMENT 08/10/2012  Patient:  Felicia Cruz, Felicia Cruz     Account Number:  [id number]     Admit date:  08/10/2012  Clinical Social Worker:  Candie Chroman  Date/Time:  08/10/2012 03:19 PM  Referred by:  Physician  Date Referred:  08/10/2012 Referred for  SNF Placement   Other Referral:   Interview type:  Patient Other interview type:    PSYCHOSOCIAL DATA Living Status:  ALONE Admitted from facility:   Level of care:   Primary support name:  Skeet Latch Primary support relationship to patient:  FRIEND Degree of support available:   unclear    CURRENT CONCERNS Current Concerns  Post-Acute Placement   Other Concerns:    SOCIAL WORK ASSESSMENT / PLAN Pt is a 72 yr old female living at home prior to hospitalization . CSW met with pt to assist with d/c planning. Pt has made prior arrangements to have ST Rehab at [facility] following hospital d/c. CSW contacted SNF and d/c plan has been confirmed. CSW will follow to assist with d/c planning to SNF.   Assessment/plan status:  Psychosocial Support/Ongoing Assessment of Needs Other assessment/ plan:   Information/referral to community resources:   None needed at this time.    PATIENT'S/FAMILY'S RESPONSE TO PLAN OF CARE: Pt is looking forward to feeling better and having rehab at [facility].   Cori Razor LCSW [phone]

## 2012-08-10 NOTE — Progress Notes (Signed)
Clinical Social Work Department CLINICAL SOCIAL WORK PLACEMENT NOTE 08/10/2012  Patient:  Felicia Cruz, Felicia Cruz  Account Number:  [id number] Admit date:  08/10/2012  Clinical Social Worker:  Cori Razor, LCSW  Date/time:  08/10/2012 03:27 PM  Clinical Social Work is seeking post-discharge placement for this patient at the following level of care:   SKILLED NURSING   (*CSW will update this form in Epic as items are completed)     Patient/family provided with [facility] Department of Clinical Social Work's list of facilities offering this level of care within the geographic area requested by the patient (or if unable, by the patient's family).  08/10/2012  Patient/family informed of their freedom to choose among providers that offer the needed level of care, that participate in Medicare, Medicaid or managed care program needed by the patient, have an available bed and are willing to accept the patient.    Patient/family informed of [facility]' ownership interest in [facility], as well as of the fact that they are under no obligation to receive care at this facility.  PASARR submitted to [employer] on 08/10/2012 PASARR number received from [employer] on 08/10/2012  FL2 transmitted to all facilities in geographic area requested by pt/family on  08/10/2012 FL2 transmitted to all facilities within larger geographic area on   Patient informed that his/her managed care company has contracts with or will negotiate with  certain facilities, including the following:     Patient/family informed of bed offers received:  08/10/2012 Patient chooses bed at [facility] Physician recommends and patient chooses bed at    Patient to be transferred to [facility] on   Patient to be transferred to facility by   The following physician request were entered in Epic:   Additional Comments:  Cori Razor LCSW [phone]

## 2012-08-10 NOTE — Progress Notes (Signed)
Received order for rolling walker and commode.  Patient declined the rolling walker stating that she already had one at home.  Commode has been delivered to the room.

## 2012-08-10 NOTE — Anesthesia Procedure Notes (Signed)
Spinal  Patient location during procedure: OR Start time: 08/10/2012 7:40 AM End time: 08/10/2012 7:46 AM Staffing Anesthesiologist: Ronelle Nigh L Performed by: anesthesiologist  Preanesthetic Checklist Completed: patient identified, site marked, surgical consent, pre-op evaluation, timeout performed, IV checked, risks and benefits discussed and monitors and equipment checked Spinal Block Patient position: sitting Prep: Betadine Patient monitoring: heart rate, continuous pulse ox and blood pressure Approach: midline Location: L2-3 Injection technique: single-shot Needle Needle type: Spinocan  Needle gauge: 22 G Needle length: 9 cm Assessment Sensory level: T6 Additional Notes Expiration date of kit checked and confirmed. Patient tolerated procedure well, without complications.

## 2012-08-10 NOTE — Anesthesia Postprocedure Evaluation (Addendum)
  Anesthesia Post-op Note  Patient: Felicia Cruz  Procedure(s) Performed: Procedure(s) (LRB): TOTAL KNEE ARTHROPLASTY (Left)  Patient Location: PACU  Anesthesia Type: Spinal  Level of Consciousness: awake and alert   Airway and Oxygen Therapy: Patient Spontanous Breathing  Post-op Pain: mild  Post-op Assessment: Post-op Vital signs reviewed, Patient's Cardiovascular Status Stable, Respiratory Function Stable, Patent Airway and No signs of Nausea or vomiting  Last Vitals:  Filed Vitals:   08/10/12 1033  BP: 149/72  Pulse: 80  Temp: 36.3 C  Resp: 18    Post-op Vital Signs: stable   Complications: No apparent anesthesia complications

## 2012-08-10 NOTE — Evaluation (Signed)
Physical Therapy Evaluation Patient Details Name: Felicia Cruz MRN: [id number] DOB: 1940-07-03 Today's Date: 08/10/2012 Time: 0865-7846 PT Time Calculation (min): 18 min  PT Assessment / Plan / Recommendation Clinical Impression  s/p L TKA and will benefit from PT in the acute setting as well as f/u rehab, pt planning for SNF    PT Assessment  Patient needs continued PT services    Follow Up Recommendations  SNF    Does the patient have the potential to tolerate intense rehabilitation      Barriers to Discharge        Equipment Recommendations  Rolling walker with 5" wheels    Recommendations for Other Services     Frequency 7X/week    Precautions / Restrictions Precautions Precautions: Knee   Pertinent Vitals/Pain VSS; pt took her own O2 off, Sats >95% on RA      Mobility  Bed Mobility Bed Mobility: Supine to Sit Transfers Transfers: Sit to Stand;Stand to Sit Sit to Stand: 4: Min guard Stand to Sit: 4: Min guard Details for Transfer Assistance: cues for hand placement Ambulation/Gait Ambulation/Gait Assistance: 4: Min guard Ambulation Distance (Feet): 60 Feet Assistive device: Rolling walker Gait Pattern: Step-to pattern    Exercises Total Joint Exercises Ankle Circles/Pumps: AROM;10 reps;Both Quad Sets: Both;5 reps;AROM   PT Diagnosis: Difficulty walking  PT Problem List: Decreased strength;Decreased range of motion;Decreased activity tolerance;Decreased mobility;Decreased knowledge of use of DME PT Treatment Interventions: DME instruction;Gait training;Functional mobility training;Therapeutic activities;Therapeutic exercise;Patient/family education   PT Goals Acute Rehab PT Goals PT Goal Formulation: With patient Time For Goal Achievement: 08/14/12 Potential to Achieve Goals: Good Pt will go Supine/Side to Sit: with supervision PT Goal: Supine/Side to Sit - Progress: Goal set today Pt will go Sit to Supine/Side: with supervision PT Goal: Sit to  Supine/Side - Progress: Goal set today Pt will go Sit to Stand: with supervision PT Goal: Sit to Stand - Progress: Goal set today Pt will Ambulate: 51 - 150 feet;with supervision;with least restrictive assistive device PT Goal: Ambulate - Progress: Goal set today Pt will Perform Home Exercise Program: with supervision, verbal cues required/provided PT Goal: Perform Home Exercise Program - Progress: Goal set today  Visit Information  Last PT Received On: 08/10/12 Assistance Needed: +1    Subjective Data  Subjective: I am real sleepy Patient Stated Goal: rehab then home   Prior Functioning  Home Living Lives With: Alone Available Help at Discharge: Skilled Nursing Facility Type of Home: House Prior Function Level of Independence: Independent Driving: Yes Communication Communication: No difficulties    Cognition  Cognition Overall Cognitive Status: Appears within functional limits for tasks assessed/performed Arousal/Alertness: Awake/alert Orientation Level: Appears intact for tasks assessed Behavior During Session: [facility] for tasks performed    Extremity/Trunk Assessment Right Upper Extremity Assessment RUE ROM/Strength/Tone: [facility] for tasks assessed Left Upper Extremity Assessment LUE ROM/Strength/Tone: [facility] for tasks assessed Right Lower Extremity Assessment RLE ROM/Strength/Tone: [facility] for tasks assessed Left Lower Extremity Assessment LLE ROM/Strength/Tone: Deficits LLE ROM/Strength/Tone Deficits: ankle WFL; able to do SLR   Balance    End of Session PT - End of Session Activity Tolerance: Patient tolerated treatment well Patient left: in chair;with call bell/phone within reach  GP     [facility] 08/10/2012, 4:49 PM

## 2012-08-11 ENCOUNTER — Encounter (HOSPITAL_COMMUNITY): Payer: Self-pay | Admitting: Orthopedic Surgery

## 2012-08-11 DIAGNOSIS — Z6832 Body mass index (BMI) 32.0-32.9, adult: Secondary | ICD-10-CM

## 2012-08-11 DIAGNOSIS — E871 Hypo-osmolality and hyponatremia: Secondary | ICD-10-CM

## 2012-08-11 DIAGNOSIS — D62 Acute posthemorrhagic anemia: Secondary | ICD-10-CM

## 2012-08-11 LAB — BASIC METABOLIC PANEL
CO2: 23 mEq/L (ref 19–32)
Calcium: 8.1 mg/dL — ABNORMAL LOW (ref 8.4–10.5)
Potassium: 3.9 mEq/L (ref 3.5–5.1)
Sodium: 132 mEq/L — ABNORMAL LOW (ref 135–145)

## 2012-08-11 LAB — CBC
MCH: 25.2 pg — ABNORMAL LOW (ref 26.0–34.0)
Platelets: 304 10*3/uL (ref 150–400)
RBC: 3.06 MIL/uL — ABNORMAL LOW (ref 3.87–5.11)
WBC: 5.3 10*3/uL (ref 4.0–10.5)

## 2012-08-11 MED ORDER — OXYCODONE HCL 5 MG PO TABS
5.0000 mg | ORAL_TABLET | ORAL | Status: DC | PRN
  Administered 2012-08-11: 5 mg via ORAL
  Administered 2012-08-11: 10 mg via ORAL
  Administered 2012-08-11: 5 mg via ORAL
  Administered 2012-08-12 (×2): 15 mg via ORAL
  Administered 2012-08-12 – 2012-08-13 (×2): 10 mg via ORAL
  Filled 2012-08-11 (×4): qty 2
  Filled 2012-08-11 (×2): qty 3

## 2012-08-11 MED ORDER — OXYCODONE HCL 5 MG PO TABS
5.0000 mg | ORAL_TABLET | ORAL | Status: DC | PRN
  Filled 2012-08-11: qty 2

## 2012-08-11 NOTE — Progress Notes (Signed)
Physical Therapy Treatment Patient Details Name: Felicia Cruz MRN: [id number] DOB: April 13, 1941 Today's Date: 08/11/2012 Time: 4098-1191 PT Time Calculation (min): 29 min  PT Assessment / Plan / Recommendation Comments on Treatment Session  POD # 1 L TKR am session.  Pt in bed prior to session c/o 10/10 L knee pain having had pain meds earlier and not yet time for anything else. Did assist pt OOB to amb in hallway, tolerated well, the positioned in recliner with ICE to L knee.  Pt plans to D/C to [facility] for ST Rehab.    Follow Up Recommendations  SNF     Does the patient have the potential to tolerate intense rehabilitation     Barriers to Discharge        Equipment Recommendations  Rolling walker with 5" wheels    Recommendations for Other Services    Frequency 7X/week   Plan      Precautions / Restrictions Precautions Precautions: Knee;Fall Restrictions Weight Bearing Restrictions: No Other Position/Activity Restrictions: WBAT   Pertinent Vitals/Pain C/o 10/10 L knee pain prior to session Pre medicated    Mobility  Bed Mobility Bed Mobility: Supine to Sit;Sitting - Scoot to Edge of Bed Supine to Sit: 4: Min assist Sitting - Scoot to [place] of Bed: 4: Min assist Details for Bed Mobility Assistance: Min assist to support L LE and increased time Transfers Transfers: Sit to Stand;Stand to Sit Sit to Stand: 4: Min guard;4: Min assist;From bed Stand to Sit: 4: Min assist;4: Min guard;To chair/3-in-1 Details for Transfer Assistance: 25% VC's on safety with turns and hand placement with stand to sit Ambulation/Gait Ambulation/Gait Assistance: 4: Min guard Ambulation Distance (Feet): 64 Feet Assistive device: Rolling walker Ambulation/Gait Assistance Details: 25% VC's on proper walker to self distance and safety with turns Gait Pattern: Step-to pattern;Decreased stance time - left;Trunk flexed Gait velocity: decreased     PT Goals                                                          progressing    Visit Information  Last PT Received On: 08/11/12 Assistance Needed: +1    Subjective Data      Cognition       Balance   fair  End of Session PT - End of Session Equipment Utilized During Treatment: Gait belt Activity Tolerance: Patient tolerated treatment well Patient left: in chair;with call bell/phone within reach   [name]  PTA [facility]  Acute  Rehab Pager      [phone]

## 2012-08-11 NOTE — Care Management Note (Signed)
    Page 1 of 1   08/11/2012     6:02:48 PM   CARE MANAGEMENT NOTE 08/11/2012  Patient:  ANGELY, DIETZ   Account Number:  [id number]  Date Initiated:  08/11/2012  Documentation initiated by:  Colleen Can  Subjective/Objective Assessment:   dx left knee OA; total knee replacemnt.     Action/Plan:   CM spoke with patient. Plans are for pt to go to SNF for rehab   Anticipated DC Date:  08/13/2012   Anticipated DC Plan:  SKILLED NURSING FACILITY  In-house referral  Clinical Social Worker      DC Planning Services  CM consult      Choice offered to / List presented to:             Status of service:  Completed, signed off Medicare Important Message given?  NA - LOS <3 / Initial given by admissions (If response is "NO", the following Medicare IM given date fields will be blank) Date Medicare IM given:   Date Additional Medicare IM given:    Discharge Disposition:    Per UR Regulation:    If discussed at Long Length of Stay Meetings, dates discussed:    Comments:

## 2012-08-11 NOTE — Progress Notes (Signed)
Patient c/o pain with no relief frompain meds, matt babish PA notified with orders received.  Sharrell Ku RN

## 2012-08-11 NOTE — Progress Notes (Signed)
Physical Therapy Treatment Patient Details Name: RIELLY BRUNN MRN: [id number] DOB: [birth year] Today's Date: 08/11/2012 Time: 1435-1500 PT Time Calculation (min): 25 min  PT Assessment / Plan / Recommendation Comments on Treatment Session  POD # 1 pm session L TKR.  Assisted pt OOB to amb to BR then back to bed to perform TKR TE's.  Pt states her pain is 10/10 at rest and feels better with activity.     Follow Up Recommendations  SNF     Does the patient have the potential to tolerate intense rehabilitation     Barriers to Discharge        Equipment Recommendations  Rolling walker with 5" wheels    Recommendations for Other Services    Frequency 7X/week   Plan      Precautions / Restrictions     Pertinent Vitals/Pain C/o 10/10 L knee pain at rest and 3/10 with activity    Mobility  Bed Mobility Bed Mobility: Supine to Sit;Sit to Supine Supine to Sit: 4: Min assist Sit to Supine: 4: Min assist Details for Bed Mobility Assistance: increased time and support L LE Transfers Transfers: Sit to Stand;Stand to Sit Sit to Stand: 4: Min guard;4: Min assist;From bed;From toilet Stand to Sit: 4: Min assist;4: Min guard;To bed;To toilet Details for Transfer Assistance: 25% VC's on safety with turns and hand placement with stand to sit Ambulation/Gait Ambulation/Gait Assistance: 4: Min guard Ambulation Distance (Feet): 22 Feet (11 feet x 2 to and from BR) Assistive device: Rolling walker Ambulation/Gait Assistance Details: increased time Gait Pattern: Step-to pattern;Decreased stance time - left;Trunk flexed Gait velocity: decreased     PT Goals                                                         progressing    Visit Information  Last PT Received On: 08/11/12 Assistance Needed: +1    Subjective Data  Subjective: I need to use the bathroom   Cognition       Balance   fair  End of Session PT - End of Session Equipment Utilized During Treatment: Gait belt Activity  Tolerance: Patient tolerated treatment well Patient left: in bed;with call bell/phone within reach   [name]  PTA [facility]  Acute  Rehab Pager      [phone]

## 2012-08-11 NOTE — Progress Notes (Signed)
   Subjective: 1 Day Post-Op Procedure(s) (LRB): TOTAL KNEE ARTHROPLASTY (Left)   Patient reports pain as mild, pain well controlled. No events throughout the night. H&H a little low, but HR and BP good, at this time we will monitor her.  Objective:   VITALS:   Filed Vitals:   08/11/12 0635  BP: 145/63  Pulse: 78  Temp: 98.1 F (36.7 C)  Resp: 16    Neurovascular intact Dorsiflexion/Plantar flexion intact Incision: dressing C/D/I No cellulitis present Compartment soft  LABS  Recent Labs  08/11/12 0502  HGB 7.7*  HCT 23.2*  WBC 5.3  PLT 304     Recent Labs  08/11/12 0502  NA 132*  K 3.9  BUN 11  CREATININE 0.54  GLUCOSE 104*     Assessment/Plan: 1 Day Post-Op Procedure(s) (LRB): TOTAL KNEE ARTHROPLASTY (Left) HV drain d/c'ed Foley cath d/c'ed Advance diet Up with therapy D/C IV fluids Discharge to SNF (prefers [facility]) eventually, when ready  Expected ABLA  Treated with iron and will observe  Overweight (BMI 25-29.9) Estimated body mass index is 27.99 kg/(m^2) as calculated from the following:   Height as of this encounter: 5' 6.14" (1.68 m).   Weight as of this encounter: 79 kg (174 lb 2.6 oz). Patient also counseled that weight may inhibit the healing process Patient counseled that losing weight will help with future health issues  Hyponatremia Treated with IV fluids and will observe     Anastasio Auerbach. Babish   PAC  08/11/2012, 7:27 AM

## 2012-08-12 LAB — BASIC METABOLIC PANEL
CO2: 25 mEq/L (ref 19–32)
Chloride: 99 mEq/L (ref 96–112)
Sodium: 134 mEq/L — ABNORMAL LOW (ref 135–145)

## 2012-08-12 LAB — CBC
Hemoglobin: 8.1 g/dL — ABNORMAL LOW (ref 12.0–15.0)
MCV: 75.8 fL — ABNORMAL LOW (ref 78.0–100.0)
Platelets: 364 10*3/uL (ref 150–400)
RBC: 3.26 MIL/uL — ABNORMAL LOW (ref 3.87–5.11)
WBC: 7 10*3/uL (ref 4.0–10.5)

## 2012-08-12 MED ORDER — KETOROLAC TROMETHAMINE 10 MG PO TABS: 15.0000 mg | ORAL_TABLET | Freq: Four times a day (QID) | ORAL | Status: DC

## 2012-08-12 MED ORDER — KETOROLAC TROMETHAMINE 10 MG PO TABS
10.0000 mg | ORAL_TABLET | Freq: Four times a day (QID) | ORAL | Status: AC
  Administered 2012-08-12 – 2012-08-13 (×4): 10 mg via ORAL
  Filled 2012-08-12 (×4): qty 1

## 2012-08-12 MED ORDER — KETOROLAC TROMETHAMINE 15 MG/ML IJ SOLN: 15.0000 mg | Freq: Four times a day (QID) | INTRAMUSCULAR | Status: DC

## 2012-08-12 NOTE — Progress Notes (Signed)
   Subjective: 2 Days Post-Op Procedure(s) (LRB): TOTAL KNEE ARTHROPLASTY (Left)   Patient reports pain as moderate, she feels that the pain isn't fully controlled. Pain is somewhat controlled with medication, but it returns prior to her being able to get more medication.  Objective:   VITALS:   Filed Vitals:   08/12/12 0727  BP: 134/69  Pulse: 91  Temp: 98.1 F (36.7 C)  Resp: 16    Neurovascular intact Dorsiflexion/Plantar flexion intact Incision: dressing C/D/I No cellulitis present Compartment soft  LABS  Recent Labs  08/11/12 0502 08/12/12 0444  HGB 7.7* 8.1*  HCT 23.2* 24.7*  WBC 5.3 7.0  PLT 304 364     Recent Labs  08/11/12 0502 08/12/12 0444  NA 132* 134*  K 3.9 3.8  BUN 11 7  CREATININE 0.54 0.53  GLUCOSE 104* 106*     Assessment/Plan: 2 Days Post-Op Procedure(s) (LRB): TOTAL KNEE ARTHROPLASTY (Left) Toradol added to help with pain ACE bandage removed Up with therapy Discharge to SNF [facility]) eventually, possibly tomorrow if doing well.  Expected ABLA  Treated with iron and will observe   Overweight (BMI 25-29.9)  Estimated body mass index is 27.99 kg/(m^2) as calculated from the following:     Height as of this encounter: 5' 6.14" (1.68 m).     Weight as of this encounter: 79 kg (174 lb 2.6 oz).  Patient also counseled that weight may inhibit the healing process  Patient counseled that losing weight will help with future health issues   Hyponatremia  Treated with IV fluids and will observe       Anastasio Auerbach. Babish   PAC  08/12/2012, 9:40 AM

## 2012-08-12 NOTE — Progress Notes (Signed)
OT  Note  Patient Details Name: SHARNIECE GIBBON MRN: [id number] DOB: 10-Feb-1941   Noted plans to DC to SNF for rehab. Will defer OT eval to SNF. Thanks, Alba Cory 08/12/2012, 10:37 AM

## 2012-08-12 NOTE — Progress Notes (Signed)
Physical Therapy Treatment Patient Details Name: Felicia Cruz MRN: [id number] DOB: 10-Oct-1940 Today's Date: 08/12/2012 Time: 4098-1191 PT Time Calculation (min): 37 min  PT Assessment / Plan / Recommendation Comments on Treatment Session  POD # 2 am session L TKR.  Assisted pt OOB to amb to BR then in hallway.  Pt progressing well.  performed TKR TE's then applied ICE.  Pt plans to D/C to [facility] for ST Rehab tomorrow.     Follow Up Recommendations  SNF     Does the patient have the potential to tolerate intense rehabilitation     Barriers to Discharge        Equipment Recommendations  Rolling walker with 5" wheels    Recommendations for Other Services    Frequency 7X/week   Plan      Precautions / Restrictions     Pertinent Vitals/Pain C/o 'soreness' "Pain is much better since MD changed my pain meds" ICE applied    Mobility  Bed Mobility Bed Mobility: Supine to Sit;Sit to Supine Supine to Sit: 4: Min guard Sitting - Scoot to [place] of Bed: 4: Min guard Details for Bed Mobility Assistance: increased time and support L LE Transfers Transfers: Sit to Stand;Stand to Sit Sit to Stand: 4: Min guard;4: Min assist;From bed;From toilet Stand to Sit: 4: Min assist;4: Min guard;To bed;To toilet Details for Transfer Assistance: 25% VC's on safety with turns and hand placement with stand to sit Ambulation/Gait Ambulation/Gait Assistance: 4: Min guard Ambulation Distance (Feet): 44 Feet Assistive device: Rolling walker Ambulation/Gait Assistance Details: <25% VC's on safety with turns and proper walker to self placement Gait Pattern: Step-to pattern;Decreased stance time - left;Trunk flexed Gait velocity: decreased    Exercises   Total Knee Replacement TE's 10 reps B LE ankle pumps 10 reps knee presses 10 reps heel slides  10 reps SAQ's 10 reps SLR's 10 reps ABD Followed by ICE   PT Goals                                                     progressing    Visit  Information  Last PT Received On: 08/12/12 Assistance Needed: +1    Subjective Data  Subjective: I need to use the bathroom Patient Stated Goal: rehab then home   Cognition       Balance     End of Session PT - End of Session Equipment Utilized During Treatment: Gait belt Activity Tolerance: Patient tolerated treatment well Patient left: in bed;with call bell/phone within reach   [name]  PTA [facility]  Acute  Rehab Pager      [phone]

## 2012-08-13 DIAGNOSIS — Z471 Aftercare following joint replacement surgery: Secondary | ICD-10-CM | POA: Diagnosis not present

## 2012-08-13 DIAGNOSIS — M1990 Unspecified osteoarthritis, unspecified site: Secondary | ICD-10-CM | POA: Diagnosis not present

## 2012-08-13 DIAGNOSIS — K5900 Constipation, unspecified: Secondary | ICD-10-CM | POA: Diagnosis not present

## 2012-08-13 DIAGNOSIS — M6281 Muscle weakness (generalized): Secondary | ICD-10-CM | POA: Diagnosis not present

## 2012-08-13 DIAGNOSIS — R269 Unspecified abnormalities of gait and mobility: Secondary | ICD-10-CM | POA: Diagnosis not present

## 2012-08-13 DIAGNOSIS — D62 Acute posthemorrhagic anemia: Secondary | ICD-10-CM | POA: Diagnosis not present

## 2012-08-13 DIAGNOSIS — Z96659 Presence of unspecified artificial knee joint: Secondary | ICD-10-CM | POA: Diagnosis not present

## 2012-08-13 DIAGNOSIS — K219 Gastro-esophageal reflux disease without esophagitis: Secondary | ICD-10-CM | POA: Diagnosis not present

## 2012-08-13 DIAGNOSIS — F1021 Alcohol dependence, in remission: Secondary | ICD-10-CM | POA: Diagnosis not present

## 2012-08-13 DIAGNOSIS — G2581 Restless legs syndrome: Secondary | ICD-10-CM | POA: Diagnosis not present

## 2012-08-13 DIAGNOSIS — F411 Generalized anxiety disorder: Secondary | ICD-10-CM | POA: Diagnosis not present

## 2012-08-13 DIAGNOSIS — F329 Major depressive disorder, single episode, unspecified: Secondary | ICD-10-CM | POA: Diagnosis not present

## 2012-08-13 DIAGNOSIS — R279 Unspecified lack of coordination: Secondary | ICD-10-CM | POA: Diagnosis not present

## 2012-08-13 DIAGNOSIS — D649 Anemia, unspecified: Secondary | ICD-10-CM | POA: Diagnosis not present

## 2012-08-13 DIAGNOSIS — M949 Disorder of cartilage, unspecified: Secondary | ICD-10-CM | POA: Diagnosis not present

## 2012-08-13 MED ORDER — DIPHENHYDRAMINE HCL 25 MG PO CAPS: 25.0000 mg | ORAL_CAPSULE | Freq: Four times a day (QID) | ORAL | Status: DC | PRN

## 2012-08-13 MED ORDER — OXYCODONE HCL 5 MG PO TABS: 5.0000 mg | ORAL_TABLET | ORAL | Status: DC | PRN

## 2012-08-13 MED ORDER — TIZANIDINE HCL 4 MG PO CAPS: 4.0000 mg | ORAL_CAPSULE | Freq: Three times a day (TID) | ORAL | Status: DC

## 2012-08-13 MED ORDER — POLYETHYLENE GLYCOL 3350 17 G PO PACK: 17.0000 g | PACK | Freq: Two times a day (BID) | ORAL | Status: DC

## 2012-08-13 MED ORDER — FERROUS SULFATE 325 (65 FE) MG PO TABS: 325.0000 mg | ORAL_TABLET | Freq: Three times a day (TID) | ORAL | Status: DC

## 2012-08-13 MED ORDER — DSS 100 MG PO CAPS: 100.0000 mg | ORAL_CAPSULE | Freq: Two times a day (BID) | ORAL | Status: DC

## 2012-08-13 MED ORDER — ASPIRIN EC 325 MG PO TBEC: 325.0000 mg | DELAYED_RELEASE_TABLET | Freq: Two times a day (BID) | ORAL | Status: DC

## 2012-08-13 NOTE — Discharge Summary (Signed)
Physician Discharge Summary  Patient ID: Felicia Cruz MRN: [id number] DOB/AGE: December 09, 1940 72 y.o.  Admit date: 08/10/2012 Discharge date:  08/13/2012  Procedures:  Procedure(s) (LRB): TOTAL KNEE ARTHROPLASTY (Left)  Attending Physician:  Dr. Durene Romans   Admission Diagnoses:   Left knee OA / pain.  Discharge Diagnoses:  Principal Problem:   S/P left TKA Active Problems:   Overweight (BMI 25.0-29.9)   Hyponatremia   Expected blood loss anemia  Diagnosis  . RLS (restless legs syndrome)  . Esophageal stricture  . Osteopenia  . Hyperlipemia  . Arthritis  . Kidney stones  . GERD (gastroesophageal reflux disease)  . Substance abuse - RECOVERING ALCOLHOLIC  . Intrinsic asthma, unspecified  . Asthma  . Alcoholism  . Cataract  . 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 female, has a history of pain and functional disability in the left knee due to arthritis and has failed non-surgical conservative treatments for greater than 12 weeks to includeNSAID's and/or analgesics, corticosteriod injections and activity modification. Onset of symptoms was abrupt, starting in October of 2013, with rapidlly worsening course since that time. The patient noted prior procedures on the knee to include arthroscopy on the left knee(s). Patient currently rates pain in the left knee(s) at 10 out of 10 with activity. Patient has worsening of pain with activity and weight bearing, pain that interferes with activities of daily living, pain with passive range of motion, crepitus and joint swelling. Patient has evidence of periarticular osteophytes and joint space narrowing by imaging studies. There is no active infection. Risks, benefits and expectations were discussed with the patient. Patient understand the risks, benefits and expectations and wishes to proceed with surgery.   PCP: Evette Georges, MD   Discharged Condition: good  Hospital Course:  Patient  underwent the above stated procedure on 08/10/2012. Patient tolerated the procedure well and brought to the recovery room in good condition and subsequently to the floor.  POD #1 BP: 145/63 ; Pulse: 78 ; Temp: 98.1 F (36.7 C) ; Resp: 16  Pt's foley was removed, as well as the hemovac drain removed. IV was changed to a saline lock. Patient reports pain as mild, pain well controlled. No events throughout the night. H&H a little low, but HR and BP good, at this time we will monitor her. Neurovascular intact, dorsiflexion/plantar flexion intact, incision: dressing C/D/I, no cellulitis present and compartment soft.   LABS  Basename  08/11/12    0502   HGB  7.7  HCT  23.2   POD #2 BP: 134/69 ; Pulse: 91 ; Temp: 98.1 F (36.7 C) ; Resp: 16  Patient reports pain as moderate, she feels that the pain isn't fully controlled. Pain is somewhat controlled with medication, but it returns prior to her being able to get more medication. Neurovascular intact, dorsiflexion/plantar flexion intact, incision: dressing C/D/I, no cellulitis present and compartment soft.   LABS  Basename  08/12/12    0444   HGB  8.1  HCT  24.7   POD #3  BP: 147/66 ; Pulse: 83 ; Temp: 98.7 F (37.1 C) ; Resp: 16  Patient reports pain as mild. Pain better with addition of toradol yesterday. Ready to be discharged to SNF. Neurovascular intact, dorsiflexion/plantar flexion intact, incision: dressing C/D/I, no cellulitis present and compartment soft.   LABS   No new labs  Discharge Exam: General appearance: alert, cooperative and no distress Extremities: Homans sign is  negative, no sign of DVT, no edema, redness or tenderness in the calves or thighs and no ulcers, gangrene or trophic changes  Disposition:   SNF  with follow up in 2 weeks   Follow-up Information   Follow up with Shelda Pal, MD. Schedule an appointment as soon as possible for a visit in 2 weeks.   Contact information:   [address] [place]  [address] [place] [address] [phone]       Discharge Orders   Future Orders Complete By Expires     Call MD / Call 911  As directed     Comments:      If you experience chest pain or shortness of breath, CALL 911 and be transported to the hospital emergency room.  If you develope a fever above 101 F, pus (white drainage) or increased drainage or redness at the wound, or calf pain, call your surgeon's office.    Change dressing  As directed     Comments:      Maintain surgical dressing for 10-14 days, then change the dressing daily with sterile 4 x 4 inch gauze dressing and tape. Keep the area dry and clean.    Constipation Prevention  As directed     Comments:      Drink plenty of fluids.  Prune juice may be helpful.  You may use a stool softener, such as Colace (over the counter) 100 mg twice a day.  Use MiraLax (over the counter) for constipation as needed.    Diet - low sodium heart healthy  As directed     Discharge instructions  As directed     Comments:      Maintain surgical dressing for 10-14 days, then replace with gauze and tape. Keep the area dry and clean until follow up. Follow up in 2 weeks at [facility]. Call with any questions or concerns.    Driving restrictions  As directed     Comments:      No driving for 4 weeks    Increase activity slowly as tolerated  As directed     TED hose  As directed     Comments:      Use stockings (TED hose) for 2 weeks on both leg(s).  You may remove them at night for sleeping.    Weight bearing as tolerated  As directed          Medication List    STOP taking these medications       HYDROcodone-acetaminophen 5-325 MG per tablet  Commonly known as:  NORCO/VICODIN     HYDROcodone-homatropine 5-1.5 MG/5ML syrup  Commonly known as:  HYCODAN     meloxicam 7.5 MG tablet  Commonly known as:  MOBIC      TAKE these medications       aspirin EC 325 MG tablet  Take 1 tablet (325 mg total) by mouth 2 (two) times  daily.     buPROPion 300 MG 24 hr tablet  Commonly known as:  WELLBUTRIN XL  Take 300 mg by mouth every morning.     Calcium 500 MG Chew  Chew 3 tablets by mouth daily.     diphenhydrAMINE 25 mg capsule  Commonly known as:  BENADRYL  Take 1 capsule (25 mg total) by mouth every 6 (six) hours as needed for itching, allergies or sleep.     DSS 100 MG Caps  Take 100 mg by mouth 2 (two) times daily.     ferrous  sulfate 325 (65 FE) MG tablet  Take 1 tablet (325 mg total) by mouth 3 (three) times daily after meals.     Glucosamine HCl 1500 MG Tabs  Take by mouth.     LORazepam 1 MG tablet  Commonly known as:  ATIVAN  Take 1 mg by mouth every evening.     omeprazole 20 MG capsule  Commonly known as:  PRILOSEC  Take 20 mg by mouth 2 (two) times daily.     OVER THE COUNTER MEDICATION  Take 240 mLs by mouth daily. Joint juice glucosamine and chondritin     oxyCODONE 5 MG immediate release tablet  Commonly known as:  Oxy IR/ROXICODONE  Take 1-3 tablets (5-15 mg total) by mouth every 4 (four) hours as needed for pain.     pantoprazole 40 MG tablet  Commonly known as:  PROTONIX  Take 40 mg by mouth 2 (two) times daily.     polyethylene glycol packet  Commonly known as:  MIRALAX / GLYCOLAX  Take 17 g by mouth 2 (two) times daily.     pramipexole 1 MG tablet  Commonly known as:  MIRAPEX  Take 1 mg by mouth 2 (two) times daily.     tiZANidine 4 MG capsule  Commonly known as:  ZANAFLEX  Take 1 capsule (4 mg total) by mouth 3 (three) times daily. Muscle spasms         Signed: Anastasio Auerbach. Adria Costley   PAC  08/13/2012, 8:44 AM

## 2012-08-13 NOTE — Progress Notes (Signed)
Physical Therapy Treatment Patient Details Name: Felicia Cruz MRN: [id number] DOB: January 31, 1941 Today's Date: 08/13/2012 Time: 7829-5621 PT Time Calculation (min): 23 min  PT Assessment / Plan / Recommendation Comments on Treatment Session  Pt planning to d/c to SNF today. Progressing well with mobility.     Follow Up Recommendations  SNF     Does the patient have the potential to tolerate intense rehabilitation     Barriers to Discharge        Equipment Recommendations  Rolling walker with 5" wheels    Recommendations for Other Services    Frequency 7X/week   Plan Discharge plan remains appropriate    Precautions / Restrictions Precautions Precautions: Knee;Fall Restrictions Weight Bearing Restrictions: No LLE Weight Bearing: Weight bearing as tolerated   Pertinent Vitals/Pain 2/10 L knee    Mobility  Bed Mobility Bed Mobility: Supine to Sit Supine to Sit: 5: Supervision;HOB elevated Sitting - Scoot to Edge of Bed: 5: Supervision Transfers Transfers: Sit to Stand;Stand to Sit Sit to Stand: 4: Min guard;From bed;With upper extremity assist Stand to Sit: To bed;4: Min guard;With upper extremity assist Details for Transfer Assistance: VC's on safety with turns and hand placement with stand to sit. Pt tends to leave walker behind.  Ambulation/Gait Ambulation/Gait Assistance: 4: Min guard Ambulation Distance (Feet): 135 Feet Assistive device: Rolling walker Ambulation/Gait Assistance Details: VCs safety. Slow gait speed.  Gait Pattern: Trunk flexed;Step-to pattern;Step-through pattern;Decreased stance time - left;Decreased stride length    Exercises Total Joint Exercises Ankle Circles/Pumps: AROM;Both;20 reps;Seated Quad Sets: AROM;Both;10 reps;Seated Short Arc Quad: AROM;Left;10 reps;Seated Hip ABduction/ADduction: AROM;Left;10 reps;Seated Straight Leg Raises: AROM;Left;10 reps;Seated Knee Flexion: AAROM;Left;10 reps;Seated (with pillowcase under foot to aid in  sliding)   PT Diagnosis:    PT Problem List:   PT Treatment Interventions:     PT Goals Acute Rehab PT Goals Pt will go Supine/Side to Sit: with supervision PT Goal: Supine/Side to Sit - Progress: Met Pt will go Sit to Stand: with supervision PT Goal: Sit to Stand - Progress: Progressing toward goal Pt will Ambulate: 51 - 150 feet;with supervision;with least restrictive assistive device PT Goal: Ambulate - Progress: Progressing toward goal Pt will Perform Home Exercise Program: with supervision, verbal cues required/provided PT Goal: Perform Home Exercise Program - Progress: Progressing toward goal  Visit Information  Last PT Received On: 08/13/12 Assistance Needed: +1    Subjective Data  Subjective: Im ready to go  (not that I don't like it here) :) Patient Stated Goal: rehab then home   Cognition  Cognition Overall Cognitive Status: Appears within functional limits for tasks assessed/performed Arousal/Alertness: Awake/alert Orientation Level: Appears intact for tasks assessed Behavior During Session: [facility] for tasks performed    Balance     End of Session PT - End of Session Equipment Utilized During Treatment: Gait belt Activity Tolerance: Patient tolerated treatment well Patient left: in chair;with call bell/phone within reach   GP     Rebeca Alert, MPT Pager: [phone]

## 2012-08-13 NOTE — Progress Notes (Signed)
Pt to d/c to [facility]. Pt in no pain at d/c and in no distress.

## 2012-08-13 NOTE — Progress Notes (Signed)
Patient is set to discharge to [facility] today. Patient aware. Goldin from [facility] to complete admission paperwork prior to discharge.  PTAR called for 1:00 pickup.  Clinical Social Work Department CLINICAL SOCIAL WORK PLACEMENT NOTE 08/13/2012  Patient:  Felicia Cruz, Felicia Cruz  Account Number:  [id number] Admit date:  08/10/2012  Clinical Social Worker:  Cori Razor, LCSW  Date/time:  08/10/2012 03:27 PM  Clinical Social Work is seeking post-discharge placement for this patient at the following level of care:   SKILLED NURSING   (*CSW will update this form in Epic as items are completed)     Patient/family provided with [facility] Department of Clinical Social Work's list of facilities offering this level of care within the geographic area requested by the patient (or if unable, by the patient's family).  08/10/2012  Patient/family informed of their freedom to choose among providers that offer the needed level of care, that participate in Medicare, Medicaid or managed care program needed by the patient, have an available bed and are willing to accept the patient.    Patient/family informed of [facility]' ownership interest in [facility], as well as of the fact that they are under no obligation to receive care at this facility.  PASARR submitted to EDS on 08/10/2012 PASARR number received from EDS on 08/10/2012  FL2 transmitted to all facilities in geographic area requested by pt/family on  08/10/2012 FL2 transmitted to all facilities within larger geographic area on   Patient informed that his/her managed care company has contracts with or will negotiate with  certain facilities, including the following:     Patient/family informed of bed offers received:  08/10/2012 Patient chooses bed at [facility] Physician recommends and patient chooses bed at    Patient to be transferred to [facility] on  08/13/2012 Patient to be transferred to facility by  PTAR  The following physician request were entered in Epic:   Additional Comments:  Unice Bailey, LCSW [facility] Clinical Social Worker cell #: [phone]

## 2012-08-13 NOTE — Progress Notes (Signed)
Patient ID: Felicia Cruz, female   DOB: 1940-10-30, 72 y.o.   MRN: [id number] Subjective: 3 Days Post-Op Procedure(s) (LRB): TOTAL KNEE ARTHROPLASTY (Left)    Patient reports pain as mild.  Pain better with addition of toradol yesterday  Objective:   VITALS:   Filed Vitals:   08/13/12 0650  BP: 147/66  Pulse: 83  Temp: 98.7 F (37.1 C)  Resp: 16    Neurovascular intact Incision: dressing C/D/I  LABS  Recent Labs  08/11/12 0502 08/12/12 0444  HGB 7.7* 8.1*  HCT 23.2* 24.7*  WBC 5.3 7.0  PLT 304 364     Recent Labs  08/11/12 0502 08/12/12 0444  NA 132* 134*  K 3.9 3.8  BUN 11 7  CREATININE 0.54 0.53  GLUCOSE 104* 106*    No results found for this basename: LABPT, INR,  in the last 72 hours   Assessment/Plan: 3 Days Post-Op Procedure(s) (LRB): TOTAL KNEE ARTHROPLASTY (Left)   Up with therapy Discharge to SNF, [facility] today RTC 2 weeks

## 2012-08-18 ENCOUNTER — Non-Acute Institutional Stay (SKILLED_NURSING_FACILITY): Payer: Medicare Other | Admitting: Internal Medicine

## 2012-08-18 DIAGNOSIS — K219 Gastro-esophageal reflux disease without esophagitis: Secondary | ICD-10-CM

## 2012-08-18 DIAGNOSIS — D62 Acute posthemorrhagic anemia: Secondary | ICD-10-CM

## 2012-08-18 DIAGNOSIS — K5900 Constipation, unspecified: Secondary | ICD-10-CM

## 2012-08-19 ENCOUNTER — Non-Acute Institutional Stay (SKILLED_NURSING_FACILITY): Payer: Medicare Other | Admitting: Adult Health

## 2012-08-19 ENCOUNTER — Encounter: Payer: Self-pay | Admitting: Adult Health

## 2012-08-19 DIAGNOSIS — F411 Generalized anxiety disorder: Secondary | ICD-10-CM

## 2012-08-19 DIAGNOSIS — K5900 Constipation, unspecified: Secondary | ICD-10-CM | POA: Diagnosis not present

## 2012-08-19 DIAGNOSIS — F329 Major depressive disorder, single episode, unspecified: Secondary | ICD-10-CM

## 2012-08-19 DIAGNOSIS — D62 Acute posthemorrhagic anemia: Secondary | ICD-10-CM

## 2012-08-19 DIAGNOSIS — K219 Gastro-esophageal reflux disease without esophagitis: Secondary | ICD-10-CM | POA: Diagnosis not present

## 2012-08-19 DIAGNOSIS — G2581 Restless legs syndrome: Secondary | ICD-10-CM

## 2012-08-19 DIAGNOSIS — G4700 Insomnia, unspecified: Secondary | ICD-10-CM

## 2012-08-19 DIAGNOSIS — F3289 Other specified depressive episodes: Secondary | ICD-10-CM

## 2012-08-19 DIAGNOSIS — Z96659 Presence of unspecified artificial knee joint: Secondary | ICD-10-CM

## 2012-08-19 DIAGNOSIS — M810 Age-related osteoporosis without current pathological fracture: Secondary | ICD-10-CM

## 2012-08-19 DIAGNOSIS — Z96652 Presence of left artificial knee joint: Secondary | ICD-10-CM

## 2012-08-19 MED ORDER — OXYCODONE HCL 5 MG PO TABS: 5.0000 mg | ORAL_TABLET | ORAL | Status: DC | PRN

## 2012-08-19 MED ORDER — CALCIUM CARBONATE-VITAMIN D 500-200 MG-UNIT PO TABS: 1.0000 | ORAL_TABLET | Freq: Every day | ORAL | Status: DC

## 2012-08-19 MED ORDER — PRAMIPEXOLE DIHYDROCHLORIDE 1 MG PO TABS: 1.0000 mg | ORAL_TABLET | Freq: Two times a day (BID) | ORAL | Status: DC

## 2012-08-19 MED ORDER — ASPIRIN EC 325 MG PO TBEC: 325.0000 mg | DELAYED_RELEASE_TABLET | Freq: Two times a day (BID) | ORAL | Status: DC

## 2012-08-19 MED ORDER — BUPROPION HCL ER (XL) 300 MG PO TB24: 300.0000 mg | ORAL_TABLET | Freq: Every morning | ORAL | Status: DC

## 2012-08-19 MED ORDER — TIZANIDINE HCL 4 MG PO CAPS: 4.0000 mg | ORAL_CAPSULE | Freq: Three times a day (TID) | ORAL | Status: DC

## 2012-08-19 MED ORDER — DIPHENHYDRAMINE HCL 25 MG PO CAPS: 25.0000 mg | ORAL_CAPSULE | Freq: Four times a day (QID) | ORAL | Status: DC | PRN

## 2012-08-19 MED ORDER — DSS 100 MG PO CAPS: 100.0000 mg | ORAL_CAPSULE | Freq: Two times a day (BID) | ORAL | Status: DC

## 2012-08-19 MED ORDER — FERROUS SULFATE 325 (65 FE) MG PO TABS
325.0000 mg | ORAL_TABLET | Freq: Two times a day (BID) | ORAL | Status: DC
Start: 2012-08-19 — End: 2012-12-09

## 2012-08-19 MED ORDER — LORAZEPAM 1 MG PO TABS: 1.0000 mg | ORAL_TABLET | Freq: Every evening | ORAL | Status: DC

## 2012-08-19 MED ORDER — OMEPRAZOLE 20 MG PO CPDR: 20.0000 mg | DELAYED_RELEASE_CAPSULE | Freq: Two times a day (BID) | ORAL | Status: DC

## 2012-08-19 MED ORDER — POLYETHYLENE GLYCOL 3350 17 G PO PACK: 17.0000 g | PACK | Freq: Two times a day (BID) | ORAL | Status: DC

## 2012-08-19 NOTE — Progress Notes (Signed)
  Subjective:    Patient ID: Felicia Cruz, female    DOB: 02-08-1941, 72 y.o.   MRN: [id number]  HPI This is a 72 year old Caucasian female who is for discharge home  With Home health PT and Nursing. 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 She has been admitted for a short-term rehabilitation.   Review of Systems  Constitutional: Negative.   HENT: Negative.   Eyes: Negative.   Respiratory: Negative.   Cardiovascular: Negative for chest pain and leg swelling.  Gastrointestinal: Negative.   Hematological: Negative for adenopathy.  Psychiatric/Behavioral: Negative.        Objective:   Physical Exam  Constitutional: She is oriented to person, place, and time. She appears well-developed.  HENT:  Head: Normocephalic.  Right Ear: External ear normal.  Left Ear: External ear normal.  Eyes: Conjunctivae are normal. Pupils are equal, round, and reactive to light.  Neck: Normal range of motion. Neck supple.  Cardiovascular: Normal rate and regular rhythm.   Pulmonary/Chest: Effort normal and breath sounds normal.  Abdominal: Soft. Bowel sounds are normal.  Musculoskeletal: She exhibits edema.  LLE Edema , 2+  Neurological: She is alert and oriented to person, place, and time.  Skin: Skin is warm.  Psychiatric: She has a normal mood and affect.  LABS: 08/19/12  Wbc 5.3  hgb 7.5  hct 76  Bmp nl        Assessment & Plan:    RESTLESS LEG SYNDROME- stable.  Depression- stable  End-stage Arthritis S/P left TKA - for Home health PT and Nursing  Expected blood loss anemia- latest hgb 7.5 ( 08/19/12); continue FeSO4 and repeat CBC in 1 week.  Constipation - stable  GERD - stable.  Anxiety- stable.

## 2012-08-21 DIAGNOSIS — Z96659 Presence of unspecified artificial knee joint: Secondary | ICD-10-CM | POA: Diagnosis not present

## 2012-08-21 DIAGNOSIS — Z4789 Encounter for other orthopedic aftercare: Secondary | ICD-10-CM | POA: Diagnosis not present

## 2012-08-21 DIAGNOSIS — M76899 Other specified enthesopathies of unspecified lower limb, excluding foot: Secondary | ICD-10-CM | POA: Diagnosis not present

## 2012-08-21 DIAGNOSIS — D62 Acute posthemorrhagic anemia: Secondary | ICD-10-CM | POA: Diagnosis not present

## 2012-08-21 DIAGNOSIS — IMO0001 Reserved for inherently not codable concepts without codable children: Secondary | ICD-10-CM | POA: Diagnosis not present

## 2012-08-21 DIAGNOSIS — Z471 Aftercare following joint replacement surgery: Secondary | ICD-10-CM | POA: Diagnosis not present

## 2012-08-21 DIAGNOSIS — G2581 Restless legs syndrome: Secondary | ICD-10-CM | POA: Diagnosis not present

## 2012-08-21 NOTE — Progress Notes (Shared)
Patient ID: Felicia Cruz, female   DOB: 1940/10/17, 72 y.o.   MRN: [id number]          HISTORY & PHYSICAL  DATE:  08/18/2012  FACILITY:  [facility]  LEVEL OF CARE:  Skilled   ALLERGIES:  Allergies  Allergen Reactions   Penicillins Hives, Shortness Of Breath and Itching   Prednisone Hives, Shortness Of Breath and Itching   Latex Other (See Comments)    Skin peeling (thumb)     CHIEF COMPLAINT:  Manage left knee osteoarthritis, acute blood loss anemia and GERD.    HISTORY OF PRESENT ILLNESS:  72 year old, Caucasian female was having pain and functional disability in the left knee due to end-stage arthritis and had failed some surgical and conservative treatments.  She was having worsening pain with activity and weightbearing, pain that interfered with activities of daily living, pain with range of motion, crepitus and joint swelling.  She also had evidence of periarticular osteophytes and joint space narrowing by imaging studies.  Therefore, she underwent left total knee arthroplasty and tolerated the procedure well.  The patient is admitted to this facility for short-term rehabilitation.  She denies knee pain.    Acute blood loss anemia.  Postsurgically, patient suffered acute blood loss.  Last hemoglobins are 7.7 and 8.1.  She is currently on iron and tolerates it without any side effects.  She denies fatigue, melena or hematochezia.    GERD.  The patient is currently receiving Protonix twice a day and tolerates it without any problems.  She denies ongoing heartburn, abdominal pain, nausea or vomiting.      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   Chronic pain.  Nephrolithiasis.    PAST SURGICAL HISTORY: Past Surgical History  Procedure Laterality Date   Lumbar disc surgery  2008   Facial cosmetic surgery      eyes, nose   Abdominoplasty     Liposuction  15 to 20 yrs ago   Surgery for kidney stones     Right index finger surgery  06/19/11    AT [facility]   Laparoscopic nissen fundoplication  06/26/2011    Procedure: LAPAROSCOPIC NISSEN FUNDOPLICATION;  Surgeon: Valarie Merino, MD;  Location: [facility];  Service: General;  Laterality: N/A;  Laparoscopic Nissen repair of hiatal hernia  with lighted bougie   Esophageal manometry  02/17/2012    Procedure: ESOPHAGEAL MANOMETRY (EM);  Surgeon: Valarie Merino, MD;  Location: [facility];  Service: Endoscopy;  Laterality: N/A;   Dilation and curettage of uterus  many yrs ago    x 2   Total knee arthroplasty Left 08/10/2012    Procedure: TOTAL KNEE ARTHROPLASTY;  Surgeon: Shelda Pal, MD;  Location: [facility];  Service: Orthopedics;  Laterality: Left;     SOCIAL HISTORY:  reports that she quit smoking about 42 years ago. Her smoking use included Cigarettes. She has a 10 pack-year smoking history. She has never used smokeless tobacco. She reports that she does not drink alcohol or use illicit drugs.  The patient is a recovering alcoholic.     FAMILY HISTORY:  Family History  Problem Relation Age of Onset   Throat cancer Father     Smoker   Cancer Father     throat   Heart disease Mother    Hypertension Mother    Hip fracture Mother    Alzheimer's disease Mother    Stroke Mother    Stroke Brother 65    smoker  Father had a stroke.    CURRENT  MEDICATIONS: Reviewed per [facility]  REVIEW OF SYSTEMS:  See HPI otherwise 14 point ROS is negative.   PHYSICAL EXAMINATION  VS:  T 98.7         P 107           RR 18         BP 132/67            POX% 97 room air        WT (Lb)  GENERAL: no acute distress, normal body habitus EYES: conjunctivae normal, sclerae normal, normal eye lids MOUTH/THROAT: lips without lesions,,no lesions in the mouth,tongue is without lesions,uvula elevates in midline NECK: supple, trachea midline, no neck masses, no thyroid tenderness, no thyromegaly RESPIRATORY: breathing is even & unlabored, BS CTAB CARDIAC: RRR, no murmur,no extra heart sounds, no edema GI:  ABDOMEN: abdomen soft, normal BS, no masses, no tenderness  LIVER/SPLEEN: no hepatomegaly, no splenomegaly MUSCULOSKELETAL: HEAD: normal to inspection & palpation BACK: no kyphosis, scoliosis or spinal processes tenderness EXTREMITIES: LEFT UPPER EXTREMITY: full range of motion, normal strength & tone RIGHT UPPER EXTREMITY:  full range of motion, normal strength & tone LEFT LOWER EXTREMITY:  Strength intact.  Range of motion not tested due to surgery. RIGHT LOWER EXTREMITY:  full range of motion, normal strength & ton PSYCHIATRIC: the patient is alert & oriented to person, affect & behavior appropriate  LABS/RADIOLOGY: White count 7, hemoglobin 8.1, platelets 364.  Glucose 106, otherwise BMP normal.    ASSESSMENT/PLAN:  Left knee osteoarthritis 716.96.  Status post left total knee arthroplasty.  Continue rehabilitation.   Acute blood loss anemia 285.1.  Continue iron.  Reassess hemoglobin level.  GERD 530.81.  Well controlled.    Constipation 564.00.  Well controlled.    Restless legs syndrome (            ).  Continue Mirapex.    V58.69. Check CBC and BMP.     I have reviewed patient's medical records received from hospitalization.   CPT CODE: 16109

## 2012-08-22 DIAGNOSIS — G2581 Restless legs syndrome: Secondary | ICD-10-CM | POA: Diagnosis not present

## 2012-08-22 DIAGNOSIS — IMO0001 Reserved for inherently not codable concepts without codable children: Secondary | ICD-10-CM | POA: Diagnosis not present

## 2012-08-22 DIAGNOSIS — Z471 Aftercare following joint replacement surgery: Secondary | ICD-10-CM | POA: Diagnosis not present

## 2012-08-22 DIAGNOSIS — D62 Acute posthemorrhagic anemia: Secondary | ICD-10-CM | POA: Diagnosis not present

## 2012-08-22 DIAGNOSIS — Z96659 Presence of unspecified artificial knee joint: Secondary | ICD-10-CM | POA: Diagnosis not present

## 2012-08-24 DIAGNOSIS — Z96659 Presence of unspecified artificial knee joint: Secondary | ICD-10-CM | POA: Diagnosis not present

## 2012-08-24 DIAGNOSIS — D62 Acute posthemorrhagic anemia: Secondary | ICD-10-CM | POA: Diagnosis not present

## 2012-08-24 DIAGNOSIS — IMO0001 Reserved for inherently not codable concepts without codable children: Secondary | ICD-10-CM | POA: Diagnosis not present

## 2012-08-24 DIAGNOSIS — G2581 Restless legs syndrome: Secondary | ICD-10-CM | POA: Diagnosis not present

## 2012-08-24 DIAGNOSIS — Z471 Aftercare following joint replacement surgery: Secondary | ICD-10-CM | POA: Diagnosis not present

## 2012-08-25 DIAGNOSIS — Z471 Aftercare following joint replacement surgery: Secondary | ICD-10-CM | POA: Diagnosis not present

## 2012-08-25 DIAGNOSIS — IMO0001 Reserved for inherently not codable concepts without codable children: Secondary | ICD-10-CM | POA: Diagnosis not present

## 2012-08-25 DIAGNOSIS — G2581 Restless legs syndrome: Secondary | ICD-10-CM | POA: Diagnosis not present

## 2012-08-25 DIAGNOSIS — D62 Acute posthemorrhagic anemia: Secondary | ICD-10-CM | POA: Diagnosis not present

## 2012-08-25 DIAGNOSIS — Z96659 Presence of unspecified artificial knee joint: Secondary | ICD-10-CM | POA: Diagnosis not present

## 2012-08-27 DIAGNOSIS — Z471 Aftercare following joint replacement surgery: Secondary | ICD-10-CM | POA: Diagnosis not present

## 2012-08-27 DIAGNOSIS — D62 Acute posthemorrhagic anemia: Secondary | ICD-10-CM | POA: Diagnosis not present

## 2012-08-27 DIAGNOSIS — Z96659 Presence of unspecified artificial knee joint: Secondary | ICD-10-CM | POA: Diagnosis not present

## 2012-08-27 DIAGNOSIS — IMO0001 Reserved for inherently not codable concepts without codable children: Secondary | ICD-10-CM | POA: Diagnosis not present

## 2012-08-27 DIAGNOSIS — G2581 Restless legs syndrome: Secondary | ICD-10-CM | POA: Diagnosis not present

## 2012-08-28 DIAGNOSIS — Z96659 Presence of unspecified artificial knee joint: Secondary | ICD-10-CM | POA: Diagnosis not present

## 2012-08-28 DIAGNOSIS — G2581 Restless legs syndrome: Secondary | ICD-10-CM | POA: Diagnosis not present

## 2012-08-28 DIAGNOSIS — D62 Acute posthemorrhagic anemia: Secondary | ICD-10-CM | POA: Diagnosis not present

## 2012-08-28 DIAGNOSIS — IMO0001 Reserved for inherently not codable concepts without codable children: Secondary | ICD-10-CM | POA: Diagnosis not present

## 2012-08-28 DIAGNOSIS — Z471 Aftercare following joint replacement surgery: Secondary | ICD-10-CM | POA: Diagnosis not present

## 2012-08-31 DIAGNOSIS — Z96659 Presence of unspecified artificial knee joint: Secondary | ICD-10-CM | POA: Diagnosis not present

## 2012-08-31 DIAGNOSIS — Z471 Aftercare following joint replacement surgery: Secondary | ICD-10-CM | POA: Diagnosis not present

## 2012-08-31 DIAGNOSIS — G2581 Restless legs syndrome: Secondary | ICD-10-CM | POA: Diagnosis not present

## 2012-08-31 DIAGNOSIS — IMO0001 Reserved for inherently not codable concepts without codable children: Secondary | ICD-10-CM | POA: Diagnosis not present

## 2012-08-31 DIAGNOSIS — D62 Acute posthemorrhagic anemia: Secondary | ICD-10-CM | POA: Diagnosis not present

## 2012-09-02 ENCOUNTER — Telehealth: Payer: Self-pay | Admitting: Family Medicine

## 2012-09-02 ENCOUNTER — Other Ambulatory Visit: Payer: Self-pay | Admitting: *Deleted

## 2012-09-02 ENCOUNTER — Ambulatory Visit: Payer: Medicare Other | Attending: Orthopedic Surgery | Admitting: Physical Therapy

## 2012-09-02 DIAGNOSIS — R262 Difficulty in walking, not elsewhere classified: Secondary | ICD-10-CM | POA: Insufficient documentation

## 2012-09-02 DIAGNOSIS — M25569 Pain in unspecified knee: Secondary | ICD-10-CM | POA: Insufficient documentation

## 2012-09-02 DIAGNOSIS — M25669 Stiffness of unspecified knee, not elsewhere classified: Secondary | ICD-10-CM | POA: Insufficient documentation

## 2012-09-02 DIAGNOSIS — IMO0001 Reserved for inherently not codable concepts without codable children: Secondary | ICD-10-CM | POA: Insufficient documentation

## 2012-09-02 NOTE — Telephone Encounter (Signed)
On my schedule for tomorrow - looks like hospital follow up. Probably best if sees Dr. Tawanna Cooler if possible if not acute issue. If acute and can't wait to see Dr. Tawanna Cooler needs 30 minute appt if possible. Thanks.

## 2012-09-03 ENCOUNTER — Ambulatory Visit: Payer: Medicare Other | Admitting: Internal Medicine

## 2012-09-03 ENCOUNTER — Encounter: Payer: Self-pay | Admitting: Family Medicine

## 2012-09-03 ENCOUNTER — Ambulatory Visit (INDEPENDENT_AMBULATORY_CARE_PROVIDER_SITE_OTHER): Payer: Medicare Other | Admitting: Family Medicine

## 2012-09-03 VITALS — BP 140/80 | Temp 97.4°F | Wt 176.0 lb

## 2012-09-03 DIAGNOSIS — D62 Acute posthemorrhagic anemia: Secondary | ICD-10-CM

## 2012-09-03 DIAGNOSIS — D649 Anemia, unspecified: Secondary | ICD-10-CM | POA: Diagnosis not present

## 2012-09-03 LAB — POCT HEMOGLOBIN: Hemoglobin: 9.6 g/dL — AB (ref 12.2–16.2)

## 2012-09-03 NOTE — Patient Instructions (Signed)
Stop the aspirin and the Mobic.,,,,,,,, did not take any NSAIDs  Tylenol 2 tabs 3 times daily for minor pain  Vicodin one half tab each bedtime when necessary  Iron tabs 2 daily at bedtime  Return in 2 weeks for followup sooner if any problems

## 2012-09-03 NOTE — Progress Notes (Signed)
  Subjective:    Patient ID: Felicia Cruz, female    DOB: [birth year], 72 y.o.   MRN: [id number]  HPI Felicia Cruz is a 72 year old widowed female nonsmoker who comes in today for followup of anemia  She had a left total knee replacement by Dr. Vallery Sa on March 10. Her preop hemoglobin was 10 postop hemoglobin was 7.2. Since that time she's been taking 6 iron tablets daily and today her hemoglobin is up to 9.6. She's also taking one aspirin tablet daily and Mobic daily. She has no complaints of fever chills nausea vomiting or diarrhea. She did notice some darkening of her bowel movements but attributes that to the 6 iron tablets daily   Review of Systems Review of systems otherwise negative    Objective:   Physical Exam Well-developed and nourished female no acute distress examination the abdomen is negative rectal normal stool guaiac-negative       Assessment & Plan:  Anemia secondary to NSAIDs plan DC all NSAIDs and aspirin Tylenol 3 times a day Vicodin each bedtime when necessary continue iron tablets 2 daily followup in 2 weeks

## 2012-09-07 ENCOUNTER — Ambulatory Visit: Payer: Medicare Other | Admitting: Physical Therapy

## 2012-09-07 DIAGNOSIS — M25669 Stiffness of unspecified knee, not elsewhere classified: Secondary | ICD-10-CM | POA: Diagnosis not present

## 2012-09-07 DIAGNOSIS — R262 Difficulty in walking, not elsewhere classified: Secondary | ICD-10-CM | POA: Diagnosis not present

## 2012-09-07 DIAGNOSIS — IMO0001 Reserved for inherently not codable concepts without codable children: Secondary | ICD-10-CM | POA: Diagnosis not present

## 2012-09-07 DIAGNOSIS — M25569 Pain in unspecified knee: Secondary | ICD-10-CM | POA: Diagnosis not present

## 2012-09-08 ENCOUNTER — Ambulatory Visit: Payer: Medicare Other | Admitting: Family Medicine

## 2012-09-10 ENCOUNTER — Ambulatory Visit: Payer: Medicare Other | Admitting: Physical Therapy

## 2012-09-11 ENCOUNTER — Ambulatory Visit: Payer: Medicare Other | Admitting: Physical Therapy

## 2012-09-11 DIAGNOSIS — R262 Difficulty in walking, not elsewhere classified: Secondary | ICD-10-CM | POA: Diagnosis not present

## 2012-09-11 DIAGNOSIS — IMO0001 Reserved for inherently not codable concepts without codable children: Secondary | ICD-10-CM | POA: Diagnosis not present

## 2012-09-11 DIAGNOSIS — M25669 Stiffness of unspecified knee, not elsewhere classified: Secondary | ICD-10-CM | POA: Diagnosis not present

## 2012-09-11 DIAGNOSIS — M25569 Pain in unspecified knee: Secondary | ICD-10-CM | POA: Diagnosis not present

## 2012-09-14 ENCOUNTER — Ambulatory Visit: Payer: Medicare Other | Admitting: Physical Therapy

## 2012-09-14 DIAGNOSIS — IMO0001 Reserved for inherently not codable concepts without codable children: Secondary | ICD-10-CM | POA: Diagnosis not present

## 2012-09-14 DIAGNOSIS — M25669 Stiffness of unspecified knee, not elsewhere classified: Secondary | ICD-10-CM | POA: Diagnosis not present

## 2012-09-14 DIAGNOSIS — M25569 Pain in unspecified knee: Secondary | ICD-10-CM | POA: Diagnosis not present

## 2012-09-14 DIAGNOSIS — R262 Difficulty in walking, not elsewhere classified: Secondary | ICD-10-CM | POA: Diagnosis not present

## 2012-09-16 ENCOUNTER — Ambulatory Visit: Payer: Medicare Other | Admitting: Physical Therapy

## 2012-09-16 DIAGNOSIS — R262 Difficulty in walking, not elsewhere classified: Secondary | ICD-10-CM | POA: Diagnosis not present

## 2012-09-16 DIAGNOSIS — M25669 Stiffness of unspecified knee, not elsewhere classified: Secondary | ICD-10-CM | POA: Diagnosis not present

## 2012-09-16 DIAGNOSIS — IMO0001 Reserved for inherently not codable concepts without codable children: Secondary | ICD-10-CM | POA: Diagnosis not present

## 2012-09-16 DIAGNOSIS — M25569 Pain in unspecified knee: Secondary | ICD-10-CM | POA: Diagnosis not present

## 2012-09-23 ENCOUNTER — Ambulatory Visit: Payer: Medicare Other | Admitting: Physical Therapy

## 2012-09-23 DIAGNOSIS — M25669 Stiffness of unspecified knee, not elsewhere classified: Secondary | ICD-10-CM | POA: Diagnosis not present

## 2012-09-23 DIAGNOSIS — M25569 Pain in unspecified knee: Secondary | ICD-10-CM | POA: Diagnosis not present

## 2012-09-23 DIAGNOSIS — IMO0001 Reserved for inherently not codable concepts without codable children: Secondary | ICD-10-CM | POA: Diagnosis not present

## 2012-09-23 DIAGNOSIS — R262 Difficulty in walking, not elsewhere classified: Secondary | ICD-10-CM | POA: Diagnosis not present

## 2012-09-25 ENCOUNTER — Ambulatory Visit: Payer: Medicare Other | Admitting: Physical Therapy

## 2012-09-25 DIAGNOSIS — M25569 Pain in unspecified knee: Secondary | ICD-10-CM | POA: Diagnosis not present

## 2012-09-25 DIAGNOSIS — IMO0001 Reserved for inherently not codable concepts without codable children: Secondary | ICD-10-CM | POA: Diagnosis not present

## 2012-09-25 DIAGNOSIS — R262 Difficulty in walking, not elsewhere classified: Secondary | ICD-10-CM | POA: Diagnosis not present

## 2012-09-25 DIAGNOSIS — M25669 Stiffness of unspecified knee, not elsewhere classified: Secondary | ICD-10-CM | POA: Diagnosis not present

## 2012-09-29 ENCOUNTER — Ambulatory Visit: Payer: Medicare Other | Admitting: Physical Therapy

## 2012-09-29 ENCOUNTER — Encounter: Payer: Self-pay | Admitting: Family Medicine

## 2012-09-29 ENCOUNTER — Ambulatory Visit (INDEPENDENT_AMBULATORY_CARE_PROVIDER_SITE_OTHER): Payer: Medicare Other | Admitting: Family Medicine

## 2012-09-29 VITALS — BP 162/90 | Temp 97.7°F | Wt 173.0 lb

## 2012-09-29 DIAGNOSIS — D649 Anemia, unspecified: Secondary | ICD-10-CM | POA: Diagnosis not present

## 2012-09-29 DIAGNOSIS — R262 Difficulty in walking, not elsewhere classified: Secondary | ICD-10-CM | POA: Diagnosis not present

## 2012-09-29 DIAGNOSIS — D62 Acute posthemorrhagic anemia: Secondary | ICD-10-CM

## 2012-09-29 DIAGNOSIS — M25669 Stiffness of unspecified knee, not elsewhere classified: Secondary | ICD-10-CM | POA: Diagnosis not present

## 2012-09-29 DIAGNOSIS — Z96652 Presence of left artificial knee joint: Secondary | ICD-10-CM

## 2012-09-29 DIAGNOSIS — R05 Cough: Secondary | ICD-10-CM

## 2012-09-29 DIAGNOSIS — R053 Chronic cough: Secondary | ICD-10-CM

## 2012-09-29 DIAGNOSIS — J45909 Unspecified asthma, uncomplicated: Secondary | ICD-10-CM

## 2012-09-29 DIAGNOSIS — Z96659 Presence of unspecified artificial knee joint: Secondary | ICD-10-CM

## 2012-09-29 DIAGNOSIS — M25569 Pain in unspecified knee: Secondary | ICD-10-CM | POA: Diagnosis not present

## 2012-09-29 DIAGNOSIS — IMO0001 Reserved for inherently not codable concepts without codable children: Secondary | ICD-10-CM | POA: Diagnosis not present

## 2012-09-29 MED ORDER — TRAMADOL HCL 50 MG PO TABS: ORAL_TABLET | ORAL | Status: DC

## 2012-09-29 NOTE — Progress Notes (Signed)
  Subjective:    Patient ID: Felicia Cruz, female    DOB: 18-Oct-1940, 72 y.o.   MRN: [id number]  HPI Felicia Cruz is a 73 year old widowed female who comes in today for evaluation of 3 issues  She had a left total knee replacement on March 10. Postop she had an anemia. We started her on iron 1 tab twice a day and today she comes back for followup. Her hemoglobin today is up to 13.3  She is in rehabilitation because of a total knee replacement and now is having pain in her right hip and right back. The physical therapist says is from a change in her gait from the knee replacement.  She was taking some oxycodone when necessary but she's out of that. She switched to Tylenol 2 tabs 3 times daily but was advised by physical therapy to stop the Tylenol.  She still has a chronic cough. She had an evaluation in pulmonary by Dr. Delford Field. He initially thought it was asthma however treatment did not resolve her symptoms. She then had a hiatal hernia repair that did not resolve her symptoms. She continues to cough.   Review of Systems    review of systems otherwise negative Objective:   Physical Exam  Well-developed well-nourished female no acute distress lungs are clear to auscultation      Assessment & Plan:  Anemia resolved with iron stop iron  Chronic cough for further Reather Converse for further evaluation and consultation  Back and hip pain secondary to total knee replacement Tylenol 3 times a day when necessary

## 2012-09-29 NOTE — Patient Instructions (Signed)
Tylenol,,,,,,,, 2 tabs 3 times daily when necessary  Tramadol 50 mg,,,,,,, one half to one tablet 3 times daily for moderate to severe pain  Call Dr. Reather Converse immunologist at [phone] for a consult concerning the chronic cough  Stop the iron and stool softener

## 2012-09-30 DIAGNOSIS — Z96659 Presence of unspecified artificial knee joint: Secondary | ICD-10-CM | POA: Diagnosis not present

## 2012-09-30 DIAGNOSIS — M76899 Other specified enthesopathies of unspecified lower limb, excluding foot: Secondary | ICD-10-CM | POA: Diagnosis not present

## 2012-09-30 DIAGNOSIS — Z4789 Encounter for other orthopedic aftercare: Secondary | ICD-10-CM | POA: Diagnosis not present

## 2012-10-01 ENCOUNTER — Telehealth: Payer: Self-pay | Admitting: Family Medicine

## 2012-10-01 DIAGNOSIS — R05 Cough: Secondary | ICD-10-CM

## 2012-10-01 DIAGNOSIS — J45909 Unspecified asthma, uncomplicated: Secondary | ICD-10-CM

## 2012-10-01 NOTE — Telephone Encounter (Signed)
Left message on machine returning patient's call 

## 2012-10-01 NOTE — Telephone Encounter (Signed)
Pt would like to ask you a question. Pls call.

## 2012-10-05 ENCOUNTER — Ambulatory Visit: Payer: Medicare Other | Attending: Orthopedic Surgery | Admitting: Physical Therapy

## 2012-10-05 DIAGNOSIS — R262 Difficulty in walking, not elsewhere classified: Secondary | ICD-10-CM | POA: Diagnosis not present

## 2012-10-05 DIAGNOSIS — M25569 Pain in unspecified knee: Secondary | ICD-10-CM | POA: Insufficient documentation

## 2012-10-05 DIAGNOSIS — M25669 Stiffness of unspecified knee, not elsewhere classified: Secondary | ICD-10-CM | POA: Diagnosis not present

## 2012-10-05 DIAGNOSIS — IMO0001 Reserved for inherently not codable concepts without codable children: Secondary | ICD-10-CM | POA: Diagnosis not present

## 2012-10-07 ENCOUNTER — Ambulatory Visit: Payer: Medicare Other | Admitting: Physical Therapy

## 2012-10-07 DIAGNOSIS — M25569 Pain in unspecified knee: Secondary | ICD-10-CM | POA: Diagnosis not present

## 2012-10-07 DIAGNOSIS — M25669 Stiffness of unspecified knee, not elsewhere classified: Secondary | ICD-10-CM | POA: Diagnosis not present

## 2012-10-07 DIAGNOSIS — IMO0001 Reserved for inherently not codable concepts without codable children: Secondary | ICD-10-CM | POA: Diagnosis not present

## 2012-10-07 DIAGNOSIS — J309 Allergic rhinitis, unspecified: Secondary | ICD-10-CM | POA: Diagnosis not present

## 2012-10-07 DIAGNOSIS — R05 Cough: Secondary | ICD-10-CM | POA: Diagnosis not present

## 2012-10-07 DIAGNOSIS — R262 Difficulty in walking, not elsewhere classified: Secondary | ICD-10-CM | POA: Diagnosis not present

## 2012-10-12 DIAGNOSIS — M76899 Other specified enthesopathies of unspecified lower limb, excluding foot: Secondary | ICD-10-CM | POA: Diagnosis not present

## 2012-10-12 DIAGNOSIS — Z4789 Encounter for other orthopedic aftercare: Secondary | ICD-10-CM | POA: Diagnosis not present

## 2012-10-12 DIAGNOSIS — Z96659 Presence of unspecified artificial knee joint: Secondary | ICD-10-CM | POA: Diagnosis not present

## 2012-10-16 ENCOUNTER — Ambulatory Visit: Payer: Medicare Other | Admitting: Physical Therapy

## 2012-10-16 DIAGNOSIS — IMO0001 Reserved for inherently not codable concepts without codable children: Secondary | ICD-10-CM | POA: Diagnosis not present

## 2012-10-16 DIAGNOSIS — M25569 Pain in unspecified knee: Secondary | ICD-10-CM | POA: Diagnosis not present

## 2012-10-16 DIAGNOSIS — M25669 Stiffness of unspecified knee, not elsewhere classified: Secondary | ICD-10-CM | POA: Diagnosis not present

## 2012-10-16 DIAGNOSIS — R262 Difficulty in walking, not elsewhere classified: Secondary | ICD-10-CM | POA: Diagnosis not present

## 2012-10-19 ENCOUNTER — Ambulatory Visit: Payer: Medicare Other | Admitting: Physical Therapy

## 2012-10-19 ENCOUNTER — Other Ambulatory Visit: Payer: Self-pay | Admitting: Adult Health

## 2012-10-22 ENCOUNTER — Other Ambulatory Visit: Payer: Self-pay | Admitting: Family Medicine

## 2012-10-23 ENCOUNTER — Ambulatory Visit: Payer: Medicare Other | Admitting: Physical Therapy

## 2012-10-23 DIAGNOSIS — IMO0001 Reserved for inherently not codable concepts without codable children: Secondary | ICD-10-CM | POA: Diagnosis not present

## 2012-10-23 DIAGNOSIS — R262 Difficulty in walking, not elsewhere classified: Secondary | ICD-10-CM | POA: Diagnosis not present

## 2012-10-23 DIAGNOSIS — M25669 Stiffness of unspecified knee, not elsewhere classified: Secondary | ICD-10-CM | POA: Diagnosis not present

## 2012-10-23 DIAGNOSIS — M25569 Pain in unspecified knee: Secondary | ICD-10-CM | POA: Diagnosis not present

## 2012-11-04 ENCOUNTER — Ambulatory Visit: Payer: Medicare Other | Admitting: Physical Therapy

## 2012-11-09 DIAGNOSIS — M25559 Pain in unspecified hip: Secondary | ICD-10-CM | POA: Diagnosis not present

## 2012-11-25 DIAGNOSIS — M25559 Pain in unspecified hip: Secondary | ICD-10-CM | POA: Diagnosis not present

## 2012-11-26 DIAGNOSIS — R05 Cough: Secondary | ICD-10-CM | POA: Diagnosis not present

## 2012-11-26 DIAGNOSIS — J309 Allergic rhinitis, unspecified: Secondary | ICD-10-CM | POA: Diagnosis not present

## 2012-12-09 ENCOUNTER — Encounter (HOSPITAL_COMMUNITY): Payer: Self-pay | Admitting: Pharmacy Technician

## 2012-12-09 NOTE — Progress Notes (Signed)
EKG 08/05/12 on EPIC

## 2012-12-10 ENCOUNTER — Encounter (HOSPITAL_COMMUNITY): Payer: Self-pay

## 2012-12-10 ENCOUNTER — Encounter (HOSPITAL_COMMUNITY)
Admission: RE | Admit: 2012-12-10 | Discharge: 2012-12-10 | Disposition: A | Discharge status: Home / Self Care | Payer: Medicare Other | Source: Ambulatory Visit | Attending: Orthopedic Surgery | Admitting: Orthopedic Surgery

## 2012-12-10 ENCOUNTER — Other Ambulatory Visit: Payer: Self-pay | Admitting: Family Medicine

## 2012-12-10 DIAGNOSIS — Z01818 Encounter for other preprocedural examination: Secondary | ICD-10-CM | POA: Insufficient documentation

## 2012-12-10 DIAGNOSIS — Z01812 Encounter for preprocedural laboratory examination: Secondary | ICD-10-CM | POA: Insufficient documentation

## 2012-12-10 LAB — CBC
Hemoglobin: 12.4 g/dL (ref 12.0–15.0)
MCH: 27.7 pg (ref 26.0–34.0)
RBC: 4.47 MIL/uL (ref 3.87–5.11)

## 2012-12-10 LAB — PROTIME-INR
INR: 0.96 (ref 0.00–1.49)
Prothrombin Time: 12.6 seconds (ref 11.6–15.2)

## 2012-12-10 LAB — BASIC METABOLIC PANEL
GFR calc non Af Amer: >90 mL/min (ref >90)
Glucose, Bld: 102 mg/dL — ABNORMAL HIGH (ref 70–99)
Potassium: 4.5 mEq/L (ref 3.5–5.1)
Sodium: 138 mEq/L (ref 135–145)

## 2012-12-10 LAB — URINALYSIS, ROUTINE W REFLEX MICROSCOPIC
Hgb urine dipstick: NEGATIVE
Leukocytes, UA: NEGATIVE
Nitrite: NEGATIVE
Specific Gravity, Urine: 1.013 (ref 1.005–1.030)
Urobilinogen, UA: 0.2 mg/dL (ref 0.0–1.0)

## 2012-12-10 NOTE — Patient Instructions (Addendum)
20      Your procedure is scheduled on:  Monday 12/28/2012  Report to [facility] at  0730 AM.  Call this number if you have problems the morning of surgery: [phone]   Remember:             IF YOU USE CPAP,BRING MASK AND TUBING AM OF SURGERY!   Do not eat food or drink liquids AFTER MIDNIGHT!  Take these medicines the morning of surgery with A SIP OF WATER: Mirapex   Do not bring valuables to the hospital. [facility] IS NOT RESPONSIBLE  FOR ANY BELONGINGS OR VALUABLES.  Wynelle Fanny suitcase in the car. After surgery it may be brought to your room.  For patients admitted to the hospital, checkout time is 11:00 AM the day of              Discharge.    DO NOT WEAR JEWELRY , MAKE-UP, LOTIONS,POWDERS,PERFUMES!             WOMEN -DO NOT SHAVE LEGS OR UNDERARMS 12 HRS. BEFORE SURGERY!               MEN MAY SHAVE AS USUAL!             CONTACTS,DENTURES OR BRIDGEWORK, FALSE EYELASHES MAY  NOT BE WORN INTO SURGERY!                                           Patients discharged the day of surgery will not be allowed to drive home. If going home the same day of surgery, must have someone stay with you first 24 hrs.at home and arrange for someone to drive you home from the Hospital.                          YOUR DRIVER IS:   Special Instructions:             Please read over the following fact sheets that you were given:             1. [place] PREPARING FOR SURGERY SHEET              2.[facility] INFORMATION              3.[facility]                                        Telford Nab.Nanney,RN,BSN     [phone]                FAILURE TO FOLLOW THESE INSTRUCTIONS MAY RESULT IN   CANCELLATION OF YOUR SURGERY!               Patient Signature:___________________________

## 2012-12-24 NOTE — H&P (Signed)
Felicia Cruz is an 72 y.o. female.    Chief Complaint:   Right gluteal tendon tear   HPI:   The patient is a 72 year old female who presents with right hip pain. The patient is being followed for their right leg pain and hip pain. They are 6 week(s) out from last visit . Symptoms reported today include: pain, catching and popping. The patient feels that they are doing poorly and report their pain level to be mild (varies in severity). The following medication has been used for pain control: aspirin (aleve and advil) and Ultram (not helping at all). The patient presents today following MRI, which revealed a full-thickness tear of the anterior half of the tendon at the greater trochanter.  There is about a 2.2 cm tendon retraction and mild muscle atrophy and low grade tearing of the posterior half of the tendon . The patient has not gotten any relief of their symptoms with Cortisone injections. The patient indicates that they have questions or concerns today regarding pain and their progress at this point.  Pain still radiates into the groin and is worse than it was and down the entire right leg. Walking is terrible. Sometiems numbness or tingling. Risks, benefits and expectations were discussed with the patient. Patient understand the risks, benefits and expectations and wishes to proceed with surgery.   PCP:  Evette Georges, MD  D/C Plans:  Home with HHPT  Post-op Meds:   No Rx given  Tranexamic Acid:   To be given  Decadron:  To be given  FYI:    ASA post-op  PMH: Past Medical History  Diagnosis Date  . RLS (restless legs syndrome)   . Esophageal stricture 2010  . Osteopenia   . Hyperlipemia   . Arthritis   . Kidney stones   . GERD (gastroesophageal reflux disease)   . Substance abuse     RECOVERING ALCOLHOLIC  . Intrinsic asthma, unspecified     9/09 positive Methacholine challege test, normal spirometry at baseline  . Asthma     PT STATES SHE WAS TREATED FOR 2 YRS FOR  ASTHMA--BUT NOW HER SX'S HAVE BEEN ATTRIBUTED TO ACID REFLUX / HIATAL HERNIA  . Alcoholism     HAS NOT HAD DRINK SINCE 1995  . Cataract   . 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 Lumbar disc surgery  2008  . Facial cosmetic surgery      eyes, nose  . Abdominoplasty    . Liposuction  15 to 20 yrs ago  . Surgery for kidney stones    . Right index finger surgery  06/19/11    AT [facility]  . Laparoscopic nissen fundoplication  06/26/2011    Procedure: LAPAROSCOPIC NISSEN FUNDOPLICATION;  Surgeon: Valarie Merino, MD;  Location: [facility];  Service: General;  Laterality: N/A;  Laparoscopic Nissen repair of hiatal hernia  with lighted bougie  . Esophageal manometry  02/17/2012    Procedure: ESOPHAGEAL MANOMETRY (EM);  Surgeon: Valarie Merino, MD;  Location: [facility];  Service: Endoscopy;  Laterality: N/A;  . Dilation and curettage of uterus  many yrs ago    x 2  . 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 Joint replacement      left  Social History:  reports that she quit smoking about 42 years ago. Her smoking use included Cigarettes. She has a 10 pack-year smoking history. She has never used smokeless tobacco. She reports that she does not drink alcohol or use illicit drugs.  Allergies:  Allergies  Allergen Reactions  . Penicillins Hives, Shortness Of Breath and Itching  . Latex Other (See Comments)    Skin peeling (thumb)    Medications: No current facility-administered medications for this encounter.   Current Outpatient Prescriptions  Medication Sig Dispense Refill  . calcium-vitamin D (OSCAL WITH D) 500-200 MG-UNIT per tablet Take 1 tablet by mouth 2 (two) times daily.      Marland Kitchen ibuprofen (ADVIL,MOTRIN) 200 MG tablet Take 400 mg by mouth every 8 (eight)  hours as needed for pain.      Marland Kitchen LORazepam (ATIVAN) 1 MG tablet Take 1 mg by mouth at bedtime.      . naproxen sodium (ANAPROX) 220 MG tablet Take 440 mg by mouth 2 (two) times daily as needed (for pain).      . pramipexole (MIRAPEX) 1 MG tablet Take 1 tablet (1 mg total) by mouth 2 (two) times daily.  60 tablet  0  . traMADol (ULTRAM) 50 MG tablet TAKE 1/2-1 TABLET BY MOUTH THREE TIMES DAILY AS NEEDED FOR PAIN  30 tablet  6    Review of Systems  Constitutional: Negative.   HENT: Negative.   Eyes: Negative.   Respiratory: Negative.   Cardiovascular: Negative.   Gastrointestinal: Positive for heartburn.  Genitourinary: Negative.   Musculoskeletal: Positive for joint pain.  Skin: Negative.   Neurological: Negative.   Endo/Heme/Allergies: Negative.   Psychiatric/Behavioral: Negative.      Physical Exam  Constitutional: She is oriented to person, place, and time and well-developed, well-nourished, and in no distress.  HENT:  Head: Normocephalic and atraumatic.  Mouth/Throat: Oropharynx is clear and moist.  Eyes: Pupils are equal, round, and reactive to light.  Neck: Neck supple. No JVD present. No tracheal deviation present. No thyromegaly present.  Cardiovascular: Normal rate, regular rhythm, normal heart sounds and intact distal pulses.   Pulmonary/Chest: Effort normal and breath sounds normal. No stridor. No respiratory distress. She has no wheezes.  Abdominal: Soft. There is no tenderness. There is no guarding.  Musculoskeletal:       Right hip: She exhibits decreased range of motion, decreased strength, tenderness and swelling. She exhibits no deformity and no laceration.  Lymphadenopathy:    She has no cervical adenopathy.  Neurological: She is alert and oriented to person, place, and time.  Skin: Skin is warm and dry.  Psychiatric: Affect normal.     Assessment/Plan Assessment: Right gluteal tendon tear   Plan: Patient will undergo a repair of the right gluteal tendon  on 12/28/2012 per Dr. Charlann Boxer at [facility]. Risks benefits and expectations were discussed with the patient. Patient understand risks, benefits and expectations and wishes to proceed.   Anastasio Auerbach Aidyn Sportsman   PAC  12/24/2012, 6:09 PM

## 2012-12-28 ENCOUNTER — Encounter (HOSPITAL_COMMUNITY)
Admission: RE | Disposition: A | Discharge status: Home Health | Payer: Self-pay | Source: Ambulatory Visit | Attending: Orthopedic Surgery

## 2012-12-28 ENCOUNTER — Ambulatory Visit (HOSPITAL_COMMUNITY): Payer: Medicare Other | Admitting: Anesthesiology

## 2012-12-28 ENCOUNTER — Encounter (HOSPITAL_COMMUNITY): Payer: Self-pay | Admitting: *Deleted

## 2012-12-28 ENCOUNTER — Inpatient Hospital Stay (HOSPITAL_COMMUNITY)
Admission: RE | Admit: 2012-12-28 | Discharge: 2012-12-29 | DRG: 501 | Disposition: A | Discharge status: Home Health | Payer: Medicare Other | Source: Ambulatory Visit | Attending: Orthopedic Surgery | Admitting: Orthopedic Surgery

## 2012-12-28 ENCOUNTER — Encounter (HOSPITAL_COMMUNITY): Payer: Self-pay | Admitting: Anesthesiology

## 2012-12-28 DIAGNOSIS — J45909 Unspecified asthma, uncomplicated: Secondary | ICD-10-CM | POA: Diagnosis present

## 2012-12-28 DIAGNOSIS — E663 Overweight: Secondary | ICD-10-CM | POA: Diagnosis present

## 2012-12-28 DIAGNOSIS — G2581 Restless legs syndrome: Secondary | ICD-10-CM | POA: Diagnosis present

## 2012-12-28 DIAGNOSIS — IMO0002 Reserved for concepts with insufficient information to code with codable children: Secondary | ICD-10-CM | POA: Diagnosis not present

## 2012-12-28 DIAGNOSIS — Z6829 Body mass index (BMI) 29.0-29.9, adult: Secondary | ICD-10-CM

## 2012-12-28 DIAGNOSIS — M899 Disorder of bone, unspecified: Secondary | ICD-10-CM | POA: Diagnosis present

## 2012-12-28 DIAGNOSIS — Z96659 Presence of unspecified artificial knee joint: Secondary | ICD-10-CM | POA: Diagnosis not present

## 2012-12-28 DIAGNOSIS — X58XXXA Exposure to other specified factors, initial encounter: Secondary | ICD-10-CM | POA: Diagnosis present

## 2012-12-28 DIAGNOSIS — S76011D Strain of muscle, fascia and tendon of right hip, subsequent encounter: Secondary | ICD-10-CM

## 2012-12-28 DIAGNOSIS — E785 Hyperlipidemia, unspecified: Secondary | ICD-10-CM | POA: Diagnosis not present

## 2012-12-28 DIAGNOSIS — K219 Gastro-esophageal reflux disease without esophagitis: Secondary | ICD-10-CM | POA: Diagnosis present

## 2012-12-28 DIAGNOSIS — M949 Disorder of cartilage, unspecified: Secondary | ICD-10-CM | POA: Diagnosis present

## 2012-12-28 DIAGNOSIS — S76019A Strain of muscle, fascia and tendon of unspecified hip, initial encounter: Secondary | ICD-10-CM

## 2012-12-28 DIAGNOSIS — M76899 Other specified enthesopathies of unspecified lower limb, excluding foot: Secondary | ICD-10-CM | POA: Diagnosis present

## 2012-12-28 DIAGNOSIS — Z79899 Other long term (current) drug therapy: Secondary | ICD-10-CM

## 2012-12-28 DIAGNOSIS — D62 Acute posthemorrhagic anemia: Secondary | ICD-10-CM | POA: Diagnosis present

## 2012-12-28 DIAGNOSIS — Z6832 Body mass index (BMI) 32.0-32.9, adult: Secondary | ICD-10-CM

## 2012-12-28 HISTORY — PX: OPEN SURGICAL REPAIR OF GLUTEAL TENDON: SHX5995

## 2012-12-28 LAB — TYPE AND SCREEN
ABO/RH(D): A POS
Antibody Screen: NEGATIVE

## 2012-12-28 SURGERY — REPAIR, TENDON, GLUTEUS MEDIUS, OPEN
Anesthesia: Spinal | Site: Hip | Laterality: Right | Wound class: Clean

## 2012-12-28 MED ORDER — DOCUSATE SODIUM 100 MG PO CAPS
100.0000 mg | ORAL_CAPSULE | Freq: Two times a day (BID) | ORAL | Status: DC
  Administered 2012-12-28 – 2012-12-29 (×2): 100 mg via ORAL

## 2012-12-28 MED ORDER — CLINDAMYCIN PHOSPHATE 600 MG/50ML IV SOLN
600.0000 mg | Freq: Four times a day (QID) | INTRAVENOUS | Status: AC
  Administered 2012-12-28 – 2012-12-29 (×2): 600 mg via INTRAVENOUS
  Filled 2012-12-28 (×2): qty 50

## 2012-12-28 MED ORDER — DEXAMETHASONE SODIUM PHOSPHATE 10 MG/ML IJ SOLN
10.0000 mg | Freq: Once | INTRAMUSCULAR | Status: DC
  Filled 2012-12-28: qty 1

## 2012-12-28 MED ORDER — DIPHENHYDRAMINE HCL 25 MG PO CAPS: 25.0000 mg | ORAL_CAPSULE | Freq: Four times a day (QID) | ORAL | Status: DC | PRN

## 2012-12-28 MED ORDER — DEXTROSE 5 % IV SOLN
500.0000 mg | Freq: Four times a day (QID) | INTRAVENOUS | Status: DC | PRN
  Administered 2012-12-28: 500 mg via INTRAVENOUS
  Filled 2012-12-28: qty 5

## 2012-12-28 MED ORDER — ONDANSETRON HCL 4 MG PO TABS
4.0000 mg | ORAL_TABLET | Freq: Four times a day (QID) | ORAL | Status: DC | PRN
  Filled 2012-12-28: qty 1

## 2012-12-28 MED ORDER — FENTANYL CITRATE 0.05 MG/ML IJ SOLN
INTRAMUSCULAR | Status: DC | PRN
  Administered 2012-12-28: 100 ug via INTRAVENOUS
  Administered 2012-12-28: 50 ug via INTRAVENOUS

## 2012-12-28 MED ORDER — HYDROMORPHONE HCL PF 1 MG/ML IJ SOLN
0.5000 mg | INTRAMUSCULAR | Status: DC | PRN
  Administered 2012-12-28: 1 mg via INTRAVENOUS
  Filled 2012-12-28: qty 1

## 2012-12-28 MED ORDER — PRAMIPEXOLE DIHYDROCHLORIDE 1 MG PO TABS
1.0000 mg | ORAL_TABLET | Freq: Two times a day (BID) | ORAL | Status: DC
  Administered 2012-12-28 – 2012-12-29 (×2): 1 mg via ORAL
  Filled 2012-12-28 (×3): qty 1

## 2012-12-28 MED ORDER — PROPOFOL INFUSION 10 MG/ML OPTIME
INTRAVENOUS | Status: DC | PRN
  Administered 2012-12-28: 140 ug/kg/min via INTRAVENOUS

## 2012-12-28 MED ORDER — PHENYLEPHRINE HCL 10 MG/ML IJ SOLN
INTRAMUSCULAR | Status: DC | PRN
  Administered 2012-12-28: 40 ug via INTRAVENOUS
  Administered 2012-12-28: 80 ug via INTRAVENOUS

## 2012-12-28 MED ORDER — 0.9 % SODIUM CHLORIDE (POUR BTL) OPTIME
TOPICAL | Status: DC | PRN
  Administered 2012-12-28: 1000 mL

## 2012-12-28 MED ORDER — TRANEXAMIC ACID 100 MG/ML IV SOLN
1000.0000 mg | Freq: Once | INTRAVENOUS | Status: AC
  Administered 2012-12-28: 1000 mg via INTRAVENOUS
  Filled 2012-12-28: qty 10

## 2012-12-28 MED ORDER — EPHEDRINE SULFATE 50 MG/ML IJ SOLN
INTRAMUSCULAR | Status: DC | PRN
  Administered 2012-12-28: 10 mg via INTRAVENOUS

## 2012-12-28 MED ORDER — CLINDAMYCIN PHOSPHATE 900 MG/50ML IV SOLN
900.0000 mg | INTRAVENOUS | Status: AC
  Administered 2012-12-28: 900 mg via INTRAVENOUS

## 2012-12-28 MED ORDER — FERROUS SULFATE 325 (65 FE) MG PO TABS
325.0000 mg | ORAL_TABLET | Freq: Three times a day (TID) | ORAL | Status: DC
  Administered 2012-12-28 – 2012-12-29 (×3): 325 mg via ORAL
  Filled 2012-12-28 (×5): qty 1

## 2012-12-28 MED ORDER — KETOROLAC TROMETHAMINE 15 MG/ML IJ SOLN
15.0000 mg | Freq: Four times a day (QID) | INTRAMUSCULAR | Status: DC
  Administered 2012-12-28 – 2012-12-29 (×3): 15 mg via INTRAVENOUS
  Filled 2012-12-28 (×6): qty 1

## 2012-12-28 MED ORDER — BISACODYL 10 MG RE SUPP: 10.0000 mg | Freq: Every day | RECTAL | Status: DC | PRN

## 2012-12-28 MED ORDER — MIDAZOLAM HCL 5 MG/5ML IJ SOLN
INTRAMUSCULAR | Status: DC | PRN
  Administered 2012-12-28: 2 mg via INTRAVENOUS

## 2012-12-28 MED ORDER — FENTANYL CITRATE 0.05 MG/ML IJ SOLN: 25.0000 ug | INTRAMUSCULAR | Status: DC | PRN

## 2012-12-28 MED ORDER — SODIUM CHLORIDE 0.9 % IV SOLN
100.0000 mL/h | INTRAVENOUS | Status: DC
  Administered 2012-12-28: 100 mL/h via INTRAVENOUS
  Filled 2012-12-28 (×4): qty 1000

## 2012-12-28 MED ORDER — METOCLOPRAMIDE HCL 5 MG/ML IJ SOLN: 5.0000 mg | Freq: Three times a day (TID) | INTRAMUSCULAR | Status: DC | PRN

## 2012-12-28 MED ORDER — METOCLOPRAMIDE HCL 10 MG PO TABS: 5.0000 mg | ORAL_TABLET | Freq: Three times a day (TID) | ORAL | Status: DC | PRN

## 2012-12-28 MED ORDER — MENTHOL 3 MG MT LOZG
1.0000 | LOZENGE | OROMUCOSAL | Status: DC | PRN
  Filled 2012-12-28: qty 9

## 2012-12-28 MED ORDER — ALUM & MAG HYDROXIDE-SIMETH 200-200-20 MG/5ML PO SUSP: 30.0000 mL | ORAL | Status: DC | PRN

## 2012-12-28 MED ORDER — LACTATED RINGERS IV SOLN
INTRAVENOUS | Status: DC
  Administered 2012-12-28 (×2): via INTRAVENOUS

## 2012-12-28 MED ORDER — FLEET ENEMA 7-19 GM/118ML RE ENEM: 1.0000 | ENEMA | Freq: Once | RECTAL | Status: AC | PRN

## 2012-12-28 MED ORDER — PHENOL 1.4 % MT LIQD
1.0000 | OROMUCOSAL | Status: DC | PRN
  Filled 2012-12-28: qty 177

## 2012-12-28 MED ORDER — ONDANSETRON HCL 4 MG/2ML IJ SOLN: 4.0000 mg | Freq: Four times a day (QID) | INTRAMUSCULAR | Status: DC | PRN

## 2012-12-28 MED ORDER — PROMETHAZINE HCL 25 MG/ML IJ SOLN: 6.2500 mg | INTRAMUSCULAR | Status: DC | PRN

## 2012-12-28 MED ORDER — DEXAMETHASONE SODIUM PHOSPHATE 10 MG/ML IJ SOLN: 10.0000 mg | Freq: Once | INTRAMUSCULAR | Status: DC

## 2012-12-28 MED ORDER — METHOCARBAMOL 500 MG PO TABS
500.0000 mg | ORAL_TABLET | Freq: Four times a day (QID) | ORAL | Status: DC | PRN
  Administered 2012-12-29: 500 mg via ORAL
  Filled 2012-12-28: qty 1

## 2012-12-28 MED ORDER — LORAZEPAM 1 MG PO TABS
1.0000 mg | ORAL_TABLET | Freq: Every day | ORAL | Status: DC
  Administered 2012-12-28: 1 mg via ORAL
  Filled 2012-12-28: qty 1

## 2012-12-28 MED ORDER — HYDROCODONE-ACETAMINOPHEN 7.5-325 MG PO TABS
1.0000 | ORAL_TABLET | ORAL | Status: DC
  Administered 2012-12-28 – 2012-12-29 (×4): 2 via ORAL
  Filled 2012-12-28 (×4): qty 2

## 2012-12-28 MED ORDER — ZOLPIDEM TARTRATE 5 MG PO TABS: 5.0000 mg | ORAL_TABLET | Freq: Every evening | ORAL | Status: DC | PRN

## 2012-12-28 MED ORDER — MEPERIDINE HCL 50 MG/ML IJ SOLN: 6.2500 mg | INTRAMUSCULAR | Status: DC | PRN

## 2012-12-28 MED ORDER — ASPIRIN EC 325 MG PO TBEC
325.0000 mg | DELAYED_RELEASE_TABLET | Freq: Two times a day (BID) | ORAL | Status: DC
  Administered 2012-12-29: 325 mg via ORAL
  Filled 2012-12-28 (×3): qty 1

## 2012-12-28 MED ORDER — BUPIVACAINE IN DEXTROSE 0.75-8.25 % IT SOLN
INTRATHECAL | Status: DC | PRN
  Administered 2012-12-28: 2 mL via INTRATHECAL

## 2012-12-28 MED ORDER — POLYETHYLENE GLYCOL 3350 17 G PO PACK
17.0000 g | PACK | Freq: Two times a day (BID) | ORAL | Status: DC
  Administered 2012-12-28 – 2012-12-29 (×2): 17 g via ORAL

## 2012-12-28 SURGICAL SUPPLY — 41 items
ADH SKN CLS APL DERMABOND .7 (GAUZE/BANDAGES/DRESSINGS) ×1
BAG SPEC THK2 15X12 ZIP CLS (MISCELLANEOUS) ×1
BAG ZIPLOCK 12X15 (MISCELLANEOUS) ×2 IMPLANT
CLOTH BEACON ORANGE TIMEOUT ST (SAFETY) ×2 IMPLANT
DERMABOND ADVANCED (GAUZE/BANDAGES/DRESSINGS) ×1
DERMABOND ADVANCED .7 DNX12 (GAUZE/BANDAGES/DRESSINGS) IMPLANT
DRAPE INCISE IOBAN 85X60 (DRAPES) ×1 IMPLANT
DRAPE ORTHO SPLIT 77X108 STRL (DRAPES) ×4
DRAPE SURG 17X11 SM STRL (DRAPES) ×2 IMPLANT
DRAPE SURG ORHT 6 SPLT 77X108 (DRAPES) ×1 IMPLANT
DRAPE U-SHAPE 47X51 STRL (DRAPES) ×2 IMPLANT
DRSG AQUACEL AG ADV 3.5X10 (GAUZE/BANDAGES/DRESSINGS) ×1 IMPLANT
DURAPREP 26ML APPLICATOR (WOUND CARE) ×2 IMPLANT
ELECT REM PT RETURN 9FT ADLT (ELECTROSURGICAL) ×2
ELECTRODE REM PT RTRN 9FT ADLT (ELECTROSURGICAL) ×1 IMPLANT
FACESHIELD LNG OPTICON STERILE (SAFETY) ×1 IMPLANT
GLOVE BIOGEL PI IND STRL 7.5 (GLOVE) ×1 IMPLANT
GLOVE BIOGEL PI IND STRL 8 (GLOVE) ×1 IMPLANT
GLOVE BIOGEL PI INDICATOR 7.5 (GLOVE) ×1
GLOVE BIOGEL PI INDICATOR 8 (GLOVE) ×1
GLOVE SURG SS PI 7.5 STRL IVOR (GLOVE) ×2 IMPLANT
GLOVE SURG SS PI 8.0 STRL IVOR (GLOVE) ×2 IMPLANT
GOWN BRE IMP PREV XXLGXLNG (GOWN DISPOSABLE) ×4 IMPLANT
GOWN STRL NON-REIN LRG LVL3 (GOWN DISPOSABLE) ×2 IMPLANT
KIT BASIN OR (CUSTOM PROCEDURE TRAY) ×2 IMPLANT
MANIFOLD NEPTUNE II (INSTRUMENTS) ×2 IMPLANT
NDL MAYO 6 CRC TAPER PT (NEEDLE) IMPLANT
NEEDLE MAYO 6 CRC TAPER PT (NEEDLE) IMPLANT
NS IRRIG 1000ML POUR BTL (IV SOLUTION) ×2 IMPLANT
PACK TOTAL JOINT (CUSTOM PROCEDURE TRAY) ×2 IMPLANT
POSITIONER SURGICAL ARM (MISCELLANEOUS) ×2 IMPLANT
STAPLER VISISTAT (STAPLE) IMPLANT
SUT ETHIBOND NAB CT1 #1 30IN (SUTURE) IMPLANT
SUT FIBERWIRE #2 38 T-5 BLUE (SUTURE) ×8
SUT MNCRL AB 4-0 PS2 18 (SUTURE) ×2 IMPLANT
SUT VIC AB 1 CT1 27 (SUTURE) ×6
SUT VIC AB 1 CT1 27XBRD ANTBC (SUTURE) ×3 IMPLANT
SUT VIC AB 2-0 CT1 27 (SUTURE) ×4
SUT VIC AB 2-0 CT1 TAPERPNT 27 (SUTURE) ×1 IMPLANT
SUTURE FIBERWR #2 38 T-5 BLUE (SUTURE) IMPLANT
TOWEL OR 17X26 10 PK STRL BLUE (TOWEL DISPOSABLE) ×4 IMPLANT

## 2012-12-28 NOTE — Anesthesia Preprocedure Evaluation (Signed)
Anesthesia Evaluation  Patient identified by MRN, date of birth, ID band Patient awake    Reviewed: Allergy & Precautions, H&P , NPO status , Patient's Chart, lab work & pertinent test results  Airway Mallampati: II TM Distance: >3 FB Neck ROM: full    Dental  (+) Caps and Dental Advisory Given Most of front teeth are capped:   Pulmonary neg pulmonary ROS, asthma ,  breath sounds clear to auscultation  Pulmonary exam normal       Cardiovascular Exercise Tolerance: Good negative cardio ROS  Rhythm:regular Rate:Normal     Neuro/Psych Hx. Back surgery - disc negative neurological ROS  negative psych ROS   GI/Hepatic negative GI ROS, Neg liver ROS, GERD-  Medicated and Controlled,  Endo/Other  negative endocrine ROS  Renal/GU negative Renal ROS  negative genitourinary   Musculoskeletal   Abdominal   Peds  Hematology negative hematology ROS (+)   Anesthesia Other Findings   Reproductive/Obstetrics negative OB ROS                           Anesthesia Physical Anesthesia Plan  ASA: II  Anesthesia Plan: Spinal   Post-op Pain Management:    Induction:   Airway Management Planned: Simple Face Mask  Additional Equipment:   Intra-op Plan:   Post-operative Plan:   Informed Consent: I have reviewed the patients History and Physical, chart, labs and discussed the procedure including the risks, benefits and alternatives for the proposed anesthesia with the patient or authorized representative who has indicated his/her understanding and acceptance.   Dental advisory given  Plan Discussed with:   Anesthesia Plan Comments:         Anesthesia Quick Evaluation

## 2012-12-28 NOTE — Transfer of Care (Signed)
Immediate Anesthesia Transfer of Care Note  Patient: Felicia Cruz  Procedure(s) Performed: Procedure(s): RIGHT GLUTEAL TENDON REPAIR (Right)  Patient Location: PACU  Anesthesia Type:Spinal  Level of Consciousness: awake, alert , oriented and patient cooperative  Airway & Oxygen Therapy: Patient Spontanous Breathing and Patient connected to face mask oxygen  Post-op Assessment: Report given to PACU RN and Post -op Vital signs reviewed and stable  Post vital signs: stable  Complications: No apparent anesthesia complications  T 12 spinal level

## 2012-12-28 NOTE — Brief Op Note (Signed)
12/28/2012  3:28 PM  PATIENT:  Ola Spurr  72 y.o. female  PRE-OPERATIVE DIAGNOSIS:  Right Gluteal Tendon Tear  POST-OPERATIVE DIAGNOSIS:  Right Gluteal Tendon Tear  PROCEDURE:  Procedure(s): RIGHT GLUTEAL TENDON REPAIR (Right)  SURGEON:  Surgeon(s) and Role:    * Shelda Pal, MD - Primary  PHYSICIAN ASSISTANT: Lanney Gins, PA-C  ANESTHESIA:   spinal  EBL:  Total I/O In: 1350 [I.V.:1350] Out: 50 [Blood:50]  BLOOD ADMINISTERED:none  DRAINS: none   LOCAL MEDICATIONS USED:  NONE  SPECIMEN:  No Specimen  DISPOSITION OF SPECIMEN:  N/A  COUNTS:  YES  TOURNIQUET:  * No tourniquets in log *  DICTATION: .Other Dictation: Dictation Number [id number]  PLAN OF CARE: Admit for overnight observation  PATIENT DISPOSITION:  PACU - hemodynamically stable.   Delay start of Pharmacological VTE agent (>24hrs) due to surgical blood loss or risk of bleeding: no

## 2012-12-28 NOTE — Anesthesia Postprocedure Evaluation (Signed)
  Anesthesia Post-op Note  Patient: Felicia Cruz  Procedure(s) Performed: Procedure(s) (LRB): RIGHT GLUTEAL TENDON REPAIR (Right)  Patient Location: PACU  Anesthesia Type: Spinal  Level of Consciousness: awake and alert   Airway and Oxygen Therapy: Patient Spontanous Breathing  Post-op Pain: mild  Post-op Assessment: Post-op Vital signs reviewed, Patient's Cardiovascular Status Stable, Respiratory Function Stable, Patent Airway and No signs of Nausea or vomiting  Last Vitals:  Filed Vitals:   12/28/12 1509  BP: 150/66  Pulse: 65  Temp: 36.4 C  Resp: 15    Post-op Vital Signs: stable   Complications: No apparent anesthesia complications

## 2012-12-28 NOTE — Interval H&P Note (Signed)
History and Physical Interval Note:  12/28/2012 10:43 AM  Felicia Cruz  has presented today for surgery, with the diagnosis of Right Gluteal Tendon Tear  The various methods of treatment have been discussed with the patient and family. After consideration of risks, benefits and other options for treatment, the patient has consented to  Procedure(s): RIGHT GLUTEAL TENDON REPAIR (Right) as a surgical intervention .  The patient's history has been reviewed, patient examined, no change in status, stable for surgery.  I have reviewed the patient's chart and labs.  Questions were answered to the patient's satisfaction.     Shelda Pal

## 2012-12-28 NOTE — Anesthesia Procedure Notes (Signed)
Spinal  End time: 12/28/2012 11:41 AM Staffing CRNA/Resident: Ketzaly Cardella E Performed by: resident/CRNA  Preanesthetic Checklist Completed: patient identified, site marked, surgical consent, pre-op evaluation, IV checked, risks and benefits discussed and monitors and equipment checked Spinal Block Patient position: sitting Prep: Betadine Patient monitoring: heart rate, continuous pulse ox and blood pressure Approach: right paramedian Location: L2-3 Injection technique: single-shot Needle Needle type: Spinocan  Needle gauge: 22 G Needle length: 9 cm Assessment Sensory level: T6 Additional Notes Pt tolerated procedure well. Expiration of spinal tray 09/2012. CSF flow ,noheme or paresthesia

## 2012-12-29 ENCOUNTER — Encounter (HOSPITAL_COMMUNITY): Payer: Self-pay | Admitting: Orthopedic Surgery

## 2012-12-29 LAB — CBC
HCT: 32.9 % — ABNORMAL LOW (ref 36.0–46.0)
MCHC: 33.1 g/dL (ref 30.0–36.0)
MCV: 86.6 fL (ref 78.0–100.0)
Platelets: 271 10*3/uL (ref 150–400)
RDW: 14.7 % (ref 11.5–15.5)
WBC: 5.1 10*3/uL (ref 4.0–10.5)

## 2012-12-29 LAB — BASIC METABOLIC PANEL
BUN: 9 mg/dL (ref 6–23)
Creatinine, Ser: 0.59 mg/dL (ref 0.50–1.10)
GFR calc Af Amer: >90 mL/min (ref >90)
GFR calc non Af Amer: >90 mL/min (ref >90)
Potassium: 4.2 mEq/L (ref 3.5–5.1)

## 2012-12-29 MED ORDER — OXYCODONE HCL 5 MG PO TABS: 5.0000 mg | ORAL_TABLET | ORAL | Status: DC | PRN

## 2012-12-29 MED ORDER — OXYCODONE HCL 5 MG PO TABS
5.0000 mg | ORAL_TABLET | ORAL | Status: DC
  Administered 2012-12-29: 15 mg via ORAL
  Administered 2012-12-29: 10 mg via ORAL
  Filled 2012-12-29 (×2): qty 2
  Filled 2012-12-29: qty 1

## 2012-12-29 MED ORDER — POLYETHYLENE GLYCOL 3350 17 G PO PACK: 17.0000 g | PACK | Freq: Two times a day (BID) | ORAL | Status: DC

## 2012-12-29 MED ORDER — FERROUS SULFATE 325 (65 FE) MG PO TABS: 325.0000 mg | ORAL_TABLET | Freq: Three times a day (TID) | ORAL | Status: DC

## 2012-12-29 MED ORDER — ASPIRIN 325 MG PO TBEC: 325.0000 mg | DELAYED_RELEASE_TABLET | Freq: Two times a day (BID) | ORAL | Status: AC

## 2012-12-29 MED ORDER — METHOCARBAMOL 500 MG PO TABS: 500.0000 mg | ORAL_TABLET | Freq: Four times a day (QID) | ORAL | Status: DC | PRN

## 2012-12-29 MED ORDER — DSS 100 MG PO CAPS: 100.0000 mg | ORAL_CAPSULE | Freq: Two times a day (BID) | ORAL | Status: DC

## 2012-12-29 NOTE — Progress Notes (Signed)
OT Cancellation Note  Patient Details Name: Felicia Cruz MRN: [id number] DOB: 12-13-1940   Cancelled Treatment:    Reason Eval/Treat Not Completed: PT screened, no needs identified, will sign off  [name] 12/29/2012, 12:02 PM Marica Otter, OTR/L [phone] 12/29/2012

## 2012-12-29 NOTE — Consult Note (Signed)
Spoke to patient concerning DME needs and patient stated that she already has walker and does not need commode.

## 2012-12-29 NOTE — Progress Notes (Signed)
   Subjective: 1 Day Post-Op Procedure(s) (LRB): RIGHT GLUTEAL TENDON REPAIR (Right)   Patient reports pain as mild, pain well controlled. No events throughout the night. States that the leg already feels better than it did prior to surgery. Ready to be discharged home if she does well with PT and pain stays well controlled.   Objective:   VITALS:   Filed Vitals:   12/29/12 0600  BP: 113/65  Pulse: 76  Temp: 98.2 F (36.8 C)  Resp: 20    Neurovascular intact Dorsiflexion/Plantar flexion intact Incision: dressing C/D/I No cellulitis present Compartment soft  LABS  Recent Labs  12/29/12 0415  HGB 10.9*  HCT 32.9*  WBC 5.1  PLT 271     Recent Labs  12/29/12 0415  NA 138  K 4.2  BUN 9  CREATININE 0.59  GLUCOSE 94     Assessment/Plan: 1 Day Post-Op Procedure(s) (LRB): RIGHT GLUTEAL TENDON REPAIR (Right) Advance diet Up with therapy D/C IV fluids Discharge home with home health Follow up in 2 weeks at [facility]. Follow up with OLIN,Kimberly Coye D in 2 weeks.  Contact information:  [facility] [address] [place] [address] [phone]    Expected ABLA  Treated with iron and will observe  Overweight (BMI 25-29.9) Estimated body mass index is 29.07 kg/(m^2) as calculated from the following:   Height as of this encounter: 5\' 6"  (1.676 m).   Weight as of this encounter: 81.647 kg (180 lb). Patient also counseled that weight may inhibit the healing process Patient counseled that losing weight will help with future health issues        Anastasio Auerbach. Dennie Vecchio   PAC  12/29/2012, 8:24 AM

## 2012-12-29 NOTE — Care Management Note (Signed)
    Page 1 of 1   12/29/2012     3:25:13 PM   CARE MANAGEMENT NOTE 12/29/2012  Patient:  Felicia Cruz, Felicia Cruz   Account Number:  [id number]  Date Initiated:  12/29/2012  Documentation initiated by:  Colleen Can  Subjective/Objective Assessment:   DX RT GLUTEAL TENDON TEAR REPAIR     Action/Plan:   CM spoke with patient.  Plans arefor patient to go to her home in [place] where sister woill be caregiver. She already has RW and BR is handicapped equipped. EWants [facility] for HHpt.   Anticipated DC Date:  12/29/2012   Anticipated DC Plan:  HOME W HOME HEALTH SERVICES      DC Planning Services  CM consult      [facility] Choice  HOME HEALTH   Choice offered to / List presented to:  C-1 Patient        HH arranged  HH-2 PT      [facility] agency  [facility]   Status of service:  Completed, signed off Medicare Important Message given?   (If response is "NO", the following Medicare IM given date fields will be blank) Date Medicare IM given:   Date Additional Medicare IM given:    Discharge Disposition:  HOME W HOME HEALTH SERVICES  Per UR Regulation:    If discussed at Long Length of Stay Meetings, dates discussed:    Comments:  12/29/2012 [facility] Grete Bosko BSN RN CCM [phone] [facility] WILL PROVIDE hhPT WITH START DATE OF TOMORROW.

## 2012-12-29 NOTE — Evaluation (Signed)
Physical Therapy Evaluation Patient Details Name: Felicia Cruz MRN: [id number] DOB: Jun 25, 1940 Today's Date: 12/29/2012 Time: 8295-6213 PT Time Calculation (min): 29 min  PT Assessment / Plan / Recommendation History of Present Illness  s/p glut tendon repair  Clinical Impression  Pt instructed in no active right hip abduction and times to be aware and avoid this; Pt verbalizes understanding of precautions; pain is controlled at this time; No further needs    PT Assessment  All further PT needs can be met in the next venue of care    Follow Up Recommendations  Home health PT;No PT follow up    Does the patient have the potential to tolerate intense rehabilitation      Barriers to Discharge        Equipment Recommendations       Recommendations for Other Services     Frequency      Precautions / Restrictions Precautions Precaution Comments: NO ACTIVE HIP ABDUCTION   Pertinent Vitals/Pain       Mobility  Bed Mobility Bed Mobility: Supine to Sit Supine to Sit: 4: Min guard Transfers Transfers: Sit to Stand;Stand to Sit Sit to Stand: 5: Supervision;6: Modified independent (Device/Increase time) Stand to Sit: 5: Supervision;6: Modified independent (Device/Increase time) Details for Transfer Assistance: reviewed car transfers and avoiding any active hip abduction as well Ambulation/Gait Ambulation/Gait Assistance: 5: Supervision;6: Modified independent (Device/Increase time) Ambulation Distance (Feet): 120 Feet Assistive device: Rolling walker Ambulation/Gait Assistance Details:  cues for RW disatnce from self Gait Pattern: Step-to pattern    Exercises     PT Diagnosis:    PT Problem List:   PT Treatment Interventions:       PT Goals(Current goals can be found in the care plan section) Acute Rehab PT Goals Patient Stated Goal: to go home, recover and watch netflix PT Goal Formulation: With patient  Visit Information  Last PT Received On:  12/29/12 Assistance Needed: +1 History of Present Illness: s/p glut tendon repair       Prior Functioning  Home Living Family/patient expects to be discharged to:: Private residence Type of Home: House Home Access: Level entry Home Equipment: Environmental consultant - 4 wheels Prior Function Level of Independence: Independent Communication Communication: No difficulties    Cognition  Cognition Arousal/Alertness: Awake/alert Behavior During Therapy: WFL for tasks assessed/performed Overall Cognitive Status: Within Functional Limits for tasks assessed    Extremity/Trunk Assessment Upper Extremity Assessment Upper Extremity Assessment: Overall WFL for tasks assessed Lower Extremity Assessment Lower Extremity Assessment: RLE deficits/detail RLE Deficits / Details: UNABLE TO FULLY ASSESS  due to precautions    Balance    End of Session PT - End of Session Activity Tolerance: Patient tolerated treatment well Patient left: in chair;with call bell/phone within reach  GP     [facility] 12/29/2012, 2:03 PM

## 2012-12-29 NOTE — Op Note (Signed)
Felicia Cruz, Felicia Cruz                ACCOUNT NO.:  [id number]  MEDICAL RECORD NO.:  [id number]  LOCATION:  1617                         FACILITY:  [facility]  PHYSICIAN:  Madlyn Frankel. Charlann Boxer, M.D.  DATE OF BIRTH:  09-Oct-1940  DATE OF PROCEDURE:  12/28/2012 DATE OF DISCHARGE:                              OPERATIVE REPORT   PREOPERATIVE DIAGNOSIS:  Right gluteal tendon tear, degenerative in nature.  POSTOPERATIVE DIAGNOSES/FINDINGS: 1. Right gluteal tendon tear, degenerative in nature. 2. She was noted to have significant bony changes over the lateral     anterior aspect of her proximal femur consistent with her     persistent pain.  PROCEDURE:  Right gluteal tendon repair with debridement of bony osteophytes with primary repair to bone.  SURGEON:  Madlyn Frankel. Charlann Boxer, M.D.  ASSISTANT:  Lanney Gins, PA-C.  Note that Mr. Felicia Cruz was present for the entirety of the case from preoperative position, management of the upper extremity, general facilitation of the case with primary wound closure.  ANESTHESIA:  Spinal.  SPECIMENS:  None.  COMPLICATION:  None.  DRAINS:  None.  INDICATIONS FOR PROCEDURE:  Ms. Felicia Cruz is a 72 year old female with history of a recent right total knee replacement.  She had done very well with her recovery from a knee replacement, began developing some right lateral hip pain.  She was treated in the office conservatively with physical therapy stretching, anti-inflammatories.  She did not wish to proceed with any cortisone injections.  Given the failure to respond to conservative measures over time, an MRI had been ordered.  This revealed gluteal tendon tearing of the anterior third of the proximal femur on the right hip.  Given the persistence of her pain, she wished to proceed with more definitive measures as the conservative measures were not providing any relief.  She was having significant reduction in quality of life.  Risks of  persistent pain, recurrent  tearing were all discussed.  In addition, a standard risk of infection, DVT for the procedure. Hospital course was also discussed.  PROCEDURE IN DETAIL:  The patient was brought to the operative theater. Once adequate anesthesia, preoperative antibiotics, clindamycin were administered, she was positioned into the left lateral decubitus position with the right side up.  The right lower extremity was then prepped and draped in sterile fashion for total hip type procedure.  A time-out was performed identifying the patient, planned procedure, extremity.  The incision was made over the lateral aspect of the anterior third of the trochanter.  Soft tissue planes created.  The iliotibial band and gluteal fascia were exposed.  I then incised longitudinally the iliotibial band into the gluteal fascia.  On removing the bursal tissue, we found the obvious tear to the anterior third of the gluteus tendon, as well as significant bony changes with the cobblestone lateral trochanter as well as osteophytic type change over the anterior aspect of the trochanters consistent with perhaps chronic change from this tendon rubbing, friction syndrome across this area. Once I had the proximal femur exposed, I debrided these osteophytes using a rongeur back to a smooth surface.  Once I was satisfied with this, including the anterior third of this proximal  femur for reattachment to the tendon, I passed 3, #2 FiberWire sutures, through bone into the tendon and a suture pattern consistent with rotator cuff repair.  Once the 3 sutures had been passed, each was individually tied separate with the tendon reapproximated to bone and they were tied to themselves.  Once I did this, we had a very good reapproximation of the gluteus tendon back to the anterior third of the proximal femur.  I irrigated the wound out and reapproximated the wound in layers reapproximating some bursal tissue as well as the fascia of the  vastus lateralis over top of this primary closure.  The iliotibial band and gluteal fascia were then reapproximated using #1 Vicryl in interrupted fashion, and given the findings of an excessively tight iliotibial band, I decided to recess the iliotibial band, a bit to allow for decreased tension across the repair and then over the proximal lateral trochanter. The remaining wound was closed with 2-0 Vicryl and running 4-0 Monocryl. The hip was then cleaned, dried, dressed sterilely using Dermabond, Aquacel dressing.  The patient was brought to the recovery room in stable condition, tolerating the procedure well.     Madlyn Frankel Charlann Boxer, M.D.     MDO/MEDQ  D:  12/28/2012  T:  12/29/2012  Job:  [id number]

## 2012-12-30 DIAGNOSIS — IMO0002 Reserved for concepts with insufficient information to code with codable children: Secondary | ICD-10-CM | POA: Diagnosis not present

## 2012-12-30 DIAGNOSIS — Z4789 Encounter for other orthopedic aftercare: Secondary | ICD-10-CM | POA: Diagnosis not present

## 2012-12-30 DIAGNOSIS — M6281 Muscle weakness (generalized): Secondary | ICD-10-CM | POA: Diagnosis not present

## 2012-12-30 DIAGNOSIS — R269 Unspecified abnormalities of gait and mobility: Secondary | ICD-10-CM | POA: Diagnosis not present

## 2012-12-30 DIAGNOSIS — G2581 Restless legs syndrome: Secondary | ICD-10-CM | POA: Diagnosis not present

## 2012-12-30 DIAGNOSIS — D62 Acute posthemorrhagic anemia: Secondary | ICD-10-CM | POA: Diagnosis not present

## 2012-12-30 NOTE — Discharge Summary (Signed)
Physician Discharge Summary  Patient ID: Felicia Cruz MRN: [id number] DOB/AGE: 1941/01/12 72 y.o.  Admit date: 12/28/2012 Discharge date: 12/29/2012   Procedures:  Procedure(s) (LRB): RIGHT GLUTEAL TENDON REPAIR (Right)  Attending Physician:  Dr. Durene Romans   Admission Diagnoses:   Right gluteal tendon tear   Discharge Diagnoses:  Principal Problem:   Right gluteus tear Active Problems:   Overweight (BMI 25.0-29.9)   Expected blood loss anemia  Past Medical History  Diagnosis Date  . RLS (restless legs syndrome)   . Esophageal stricture 2010  . Osteopenia   . Hyperlipemia   . Arthritis   . Kidney stones   . GERD (gastroesophageal reflux disease)   . Substance abuse     RECOVERING ALCOLHOLIC  . Intrinsic asthma, unspecified     9/09 positive Methacholine challege test, normal spirometry at baseline  . Asthma     PT STATES SHE WAS TREATED FOR 2 YRS FOR ASTHMA--BUT NOW HER SX'S HAVE BEEN ATTRIBUTED TO ACID REFLUX / HIATAL HERNIA  . Alcoholism     HAS NOT HAD DRINK SINCE 1995  . Cataract   . 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 The patient is being followed for their right leg pain and hip pain. They are 6 week(s) out from last visit . Symptoms reported today include: pain, catching and popping. The patient feels that they are doing poorly and report their pain level to be mild (varies in severity). The following medication has been used for pain control: aspirin (aleve and advil) and Ultram (not helping at all). The patient presents today following MRI, which revealed a full-thickness tear of the anterior half of the tendon at the greater trochanter. There is about a 2.2 cm tendon retraction and mild muscle atrophy and low grade tearing of the posterior half of the tendon . The patient has not gotten any relief of their symptoms with Cortisone injections. The patient indicates that  they have questions or concerns today regarding pain and their progress at this point. Pain still radiates into the groin and is worse than it was and down the entire right leg. Walking is terrible. Sometiems numbness or tingling. Risks, benefits and expectations were discussed with the patient. Patient understand the risks, benefits and expectations and wishes to proceed with surgery.   PCP: Evette Georges, MD   Discharged Condition: good  Hospital Course:  Patient underwent the above stated procedure on 12/28/2012. Patient tolerated the procedure well and brought to the recovery room in good condition and subsequently to the floor.  POD #1 BP: 113/65 ; Pulse: 76 ; Temp: 98.2 F (36.8 C) ; Resp: 20  Pt's foley was removed, as well as the hemovac drain removed. IV was changed to a saline lock. Patient reports pain as mild, pain well controlled. No events throughout the night. States that the leg already feels better than it did prior to surgery. Ready to be discharged home. Neurovascular intact, dorsiflexion/plantar flexion intact, incision: dressing C/D/I, no cellulitis present and compartment soft.   LABS  Basename    HGB  10.9  HCT  32.9    Discharge Exam: General appearance: alert, cooperative and no distress Extremities: Homans sign is negative, no sign of DVT, no edema, redness or tenderness in the calves or thighs and no ulcers, gangrene or trophic changes  Disposition:   Home-Health Care Svc with follow  up in 2 weeks   Follow-up Information   Follow up with Shelda Pal, MD. Schedule an appointment as soon as possible for a visit in 2 weeks.   Contact information:   [address] [place] [address] [phone]       Discharge Orders   Future Orders Complete By Expires     Call MD / Call 911  As directed     Comments:      If you experience chest pain or shortness of breath, CALL 911 and be transported to the hospital emergency room.  If you  develope a fever above 101 F, pus (white drainage) or increased drainage or redness at the wound, or calf pain, call your surgeon's office.    Constipation Prevention  As directed     Comments:      Drink plenty of fluids.  Prune juice may be helpful.  You may use a stool softener, such as Colace (over the counter) 100 mg twice a day.  Use MiraLax (over the counter) for constipation as needed.    Diet - low sodium heart healthy  As directed     Discharge instructions  As directed     Comments:      Maintain surgical dressing for 10-14 days, then replace with gauze and tape. Keep the area dry and clean until follow up. Follow up in 2 weeks at [facility]. Call with any questions or concerns.    Driving restrictions  As directed     Comments:      No driving for 4 weeks    Increase activity slowly as tolerated  As directed     Weight bearing as tolerated  As directed     Comments:      NO ACTIVE ABDUCTION.    WBAT with a walker.         Medication List    STOP taking these medications       ibuprofen 200 MG tablet  Commonly known as:  ADVIL,MOTRIN     naproxen sodium 220 MG tablet  Commonly known as:  ANAPROX     traMADol 50 MG tablet  Commonly known as:  ULTRAM      TAKE these medications       aspirin 325 MG EC tablet  Take 1 tablet (325 mg total) by mouth 2 (two) times daily.     calcium-vitamin D 500-200 MG-UNIT per tablet  Commonly known as:  OSCAL WITH D  Take 1 tablet by mouth 2 (two) times daily.     DSS 100 MG Caps  Take 100 mg by mouth 2 (two) times daily.     ferrous sulfate 325 (65 FE) MG tablet  Take 1 tablet (325 mg total) by mouth 3 (three) times daily after meals.     LORazepam 1 MG tablet  Commonly known as:  ATIVAN  Take 1 mg by mouth at bedtime.     methocarbamol 500 MG tablet  Commonly known as:  ROBAXIN  Take 1 tablet (500 mg total) by mouth every 6 (six) hours as needed (muscle spasms).     oxyCODONE 5 MG immediate release tablet   Commonly known as:  Oxy IR/ROXICODONE  Take 1-3 tablets (5-15 mg total) by mouth every 4 (four) hours as needed for pain.     polyethylene glycol packet  Commonly known as:  MIRALAX / GLYCOLAX  Take 17 g by mouth 2 (two) times daily.     pramipexole 1  MG tablet  Commonly known as:  MIRAPEX  Take 1 tablet (1 mg total) by mouth 2 (two) times daily.         Signed: Anastasio Auerbach. Kynsie Falkner   PAC  12/30/2012, 4:52 PM

## 2013-01-13 DIAGNOSIS — Z4789 Encounter for other orthopedic aftercare: Secondary | ICD-10-CM | POA: Diagnosis not present

## 2013-02-22 ENCOUNTER — Ambulatory Visit: Payer: Medicare Other | Attending: Orthopedic Surgery | Admitting: Physical Therapy

## 2013-02-22 DIAGNOSIS — IMO0001 Reserved for inherently not codable concepts without codable children: Secondary | ICD-10-CM | POA: Insufficient documentation

## 2013-02-22 DIAGNOSIS — M25559 Pain in unspecified hip: Secondary | ICD-10-CM | POA: Insufficient documentation

## 2013-02-24 ENCOUNTER — Ambulatory Visit: Payer: Medicare Other | Admitting: Physical Therapy

## 2013-03-01 ENCOUNTER — Ambulatory Visit: Payer: Medicare Other | Admitting: Physical Therapy

## 2013-03-02 ENCOUNTER — Ambulatory Visit: Payer: Medicare Other | Admitting: Physical Therapy

## 2013-03-04 ENCOUNTER — Ambulatory Visit: Payer: Medicare Other | Admitting: Physical Therapy

## 2013-03-08 ENCOUNTER — Ambulatory Visit: Payer: Medicare Other | Attending: Orthopedic Surgery | Admitting: Physical Therapy

## 2013-03-08 DIAGNOSIS — IMO0001 Reserved for inherently not codable concepts without codable children: Secondary | ICD-10-CM | POA: Insufficient documentation

## 2013-03-08 DIAGNOSIS — M25559 Pain in unspecified hip: Secondary | ICD-10-CM | POA: Insufficient documentation

## 2013-03-09 ENCOUNTER — Ambulatory Visit: Payer: Medicare Other | Admitting: Physical Therapy

## 2013-03-09 DIAGNOSIS — M25559 Pain in unspecified hip: Secondary | ICD-10-CM | POA: Diagnosis not present

## 2013-03-09 DIAGNOSIS — IMO0001 Reserved for inherently not codable concepts without codable children: Secondary | ICD-10-CM | POA: Diagnosis not present

## 2013-03-11 ENCOUNTER — Ambulatory Visit: Payer: Medicare Other | Admitting: Physical Therapy

## 2013-03-11 DIAGNOSIS — M25559 Pain in unspecified hip: Secondary | ICD-10-CM | POA: Diagnosis not present

## 2013-03-11 DIAGNOSIS — IMO0001 Reserved for inherently not codable concepts without codable children: Secondary | ICD-10-CM | POA: Diagnosis not present

## 2013-03-12 ENCOUNTER — Ambulatory Visit: Payer: Medicare Other | Admitting: Physical Therapy

## 2013-03-12 DIAGNOSIS — IMO0001 Reserved for inherently not codable concepts without codable children: Secondary | ICD-10-CM | POA: Diagnosis not present

## 2013-03-12 DIAGNOSIS — M25559 Pain in unspecified hip: Secondary | ICD-10-CM | POA: Diagnosis not present

## 2013-03-22 ENCOUNTER — Ambulatory Visit: Payer: Medicare Other | Admitting: Physical Therapy

## 2013-03-22 DIAGNOSIS — IMO0001 Reserved for inherently not codable concepts without codable children: Secondary | ICD-10-CM | POA: Diagnosis not present

## 2013-03-22 DIAGNOSIS — M25559 Pain in unspecified hip: Secondary | ICD-10-CM | POA: Diagnosis not present

## 2013-03-24 DIAGNOSIS — Z4789 Encounter for other orthopedic aftercare: Secondary | ICD-10-CM | POA: Diagnosis not present

## 2013-03-25 ENCOUNTER — Ambulatory Visit: Payer: Medicare Other | Admitting: Physical Therapy

## 2013-03-30 ENCOUNTER — Ambulatory Visit: Payer: Medicare Other | Admitting: Physical Therapy

## 2013-04-01 ENCOUNTER — Ambulatory Visit: Payer: Medicare Other | Admitting: Physical Therapy

## 2013-04-07 ENCOUNTER — Ambulatory Visit (INDEPENDENT_AMBULATORY_CARE_PROVIDER_SITE_OTHER): Payer: Medicare Other | Admitting: Family Medicine

## 2013-04-07 ENCOUNTER — Encounter: Payer: Self-pay | Admitting: Family Medicine

## 2013-04-07 VITALS — BP 130/90 | Temp 97.7°F

## 2013-04-07 DIAGNOSIS — R3589 Other polyuria: Secondary | ICD-10-CM

## 2013-04-07 DIAGNOSIS — R358 Other polyuria: Secondary | ICD-10-CM | POA: Diagnosis not present

## 2013-04-07 DIAGNOSIS — Z23 Encounter for immunization: Secondary | ICD-10-CM

## 2013-04-07 DIAGNOSIS — R350 Frequency of micturition: Secondary | ICD-10-CM | POA: Diagnosis not present

## 2013-04-07 DIAGNOSIS — R32 Unspecified urinary incontinence: Secondary | ICD-10-CM | POA: Diagnosis not present

## 2013-04-07 LAB — POCT URINALYSIS DIPSTICK
Bilirubin, UA: NEGATIVE
Blood, UA: 0.015
Glucose, UA: NEGATIVE
Nitrite, UA: NEGATIVE
Spec Grav, UA: <=1.005
Urobilinogen, UA: 0.2

## 2013-04-07 NOTE — Progress Notes (Deleted)
Pre-visit discussion using our clinic review tool. No additional management support is needed unless otherwise documented below in the visit note.  

## 2013-04-07 NOTE — Progress Notes (Signed)
  Subjective:    Patient ID: Felicia Cruz, female    DOB: 1941-01-11, 72 y.o.   MRN: [id number]  HPI Olie is a 72 year old widowed female nonsmoker who comes in today with a three-month history of urinary incontinence and frequency  She had surgery on her right hip July 28 for a torn tendon. Since that time she's had very difficulty recovering and she's still having severe pain. She went to rehabilitation which didn't help. She's had 2 rounds of steroids that didn't help either. Dr. Vickey Sages has stated that the next step would be to get an MRI. She's called her office today and wants it done now and not a month from now  Since his surgery she's had urinary frequency and urgency and incontinence. She's had no fever or chills.    Review of Systems    review of systems otherwise negative Objective:   Physical Exam  Well-developed well-nourished female no acute distress urinalysis normal      Assessment & Plan:  Three-month history of new-onset urgency and frequency and urinary incontinence reconsult with Dr. Volney Presser

## 2013-04-07 NOTE — Patient Instructions (Signed)
I've called the urology Center and they will be calling you to set up a time ASAP to see Dr. Volney Presser

## 2013-04-09 DIAGNOSIS — I10 Essential (primary) hypertension: Secondary | ICD-10-CM | POA: Diagnosis not present

## 2013-04-09 DIAGNOSIS — M545 Low back pain: Secondary | ICD-10-CM | POA: Diagnosis not present

## 2013-04-09 DIAGNOSIS — Z4789 Encounter for other orthopedic aftercare: Secondary | ICD-10-CM | POA: Diagnosis not present

## 2013-04-12 DIAGNOSIS — Z4789 Encounter for other orthopedic aftercare: Secondary | ICD-10-CM | POA: Diagnosis not present

## 2013-04-12 DIAGNOSIS — M48061 Spinal stenosis, lumbar region without neurogenic claudication: Secondary | ICD-10-CM | POA: Diagnosis not present

## 2013-04-12 DIAGNOSIS — M5137 Other intervertebral disc degeneration, lumbosacral region: Secondary | ICD-10-CM | POA: Diagnosis not present

## 2013-04-15 DIAGNOSIS — D3000 Benign neoplasm of unspecified kidney: Secondary | ICD-10-CM | POA: Diagnosis not present

## 2013-04-20 DIAGNOSIS — M5137 Other intervertebral disc degeneration, lumbosacral region: Secondary | ICD-10-CM | POA: Diagnosis not present

## 2013-04-20 DIAGNOSIS — M545 Low back pain, unspecified: Secondary | ICD-10-CM | POA: Diagnosis not present

## 2013-04-20 DIAGNOSIS — M546 Pain in thoracic spine: Secondary | ICD-10-CM | POA: Diagnosis not present

## 2013-04-20 DIAGNOSIS — R269 Unspecified abnormalities of gait and mobility: Secondary | ICD-10-CM | POA: Diagnosis not present

## 2013-04-21 DIAGNOSIS — M25559 Pain in unspecified hip: Secondary | ICD-10-CM | POA: Diagnosis not present

## 2013-04-22 ENCOUNTER — Other Ambulatory Visit: Payer: Self-pay | Admitting: Family Medicine

## 2013-05-12 DIAGNOSIS — M25559 Pain in unspecified hip: Secondary | ICD-10-CM | POA: Diagnosis not present

## 2013-05-18 DIAGNOSIS — Z1231 Encounter for screening mammogram for malignant neoplasm of breast: Secondary | ICD-10-CM | POA: Diagnosis not present

## 2013-06-29 DIAGNOSIS — H2510 Age-related nuclear cataract, unspecified eye: Secondary | ICD-10-CM | POA: Diagnosis not present

## 2013-07-15 DIAGNOSIS — M25559 Pain in unspecified hip: Secondary | ICD-10-CM | POA: Diagnosis not present

## 2013-07-19 ENCOUNTER — Ambulatory Visit (INDEPENDENT_AMBULATORY_CARE_PROVIDER_SITE_OTHER): Payer: Medicare Other | Admitting: Family

## 2013-07-19 ENCOUNTER — Encounter: Payer: Self-pay | Admitting: Family

## 2013-07-19 VITALS — BP 140/82 | Temp 97.6°F | Wt 191.0 lb

## 2013-07-19 DIAGNOSIS — J209 Acute bronchitis, unspecified: Secondary | ICD-10-CM | POA: Diagnosis not present

## 2013-07-19 MED ORDER — METHYLPREDNISOLONE ACETATE 40 MG/ML IJ SUSP
80.0000 mg | Freq: Once | INTRAMUSCULAR | Status: AC
  Administered 2013-07-19: 80 mg via INTRAMUSCULAR

## 2013-07-19 MED ORDER — GUAIFENESIN-CODEINE 100-10 MG/5ML PO SYRP: 5.0000 mL | ORAL_SOLUTION | Freq: Three times a day (TID) | ORAL | Status: DC | PRN

## 2013-07-19 NOTE — Patient Instructions (Signed)

## 2013-07-19 NOTE — Progress Notes (Signed)
Pre visit review using our clinic review tool, if applicable. No additional management support is needed unless otherwise documented below in the visit note. 

## 2013-07-19 NOTE — Progress Notes (Signed)
Subjective:    Patient ID: Felicia Cruz, female    DOB: 1941-05-04, 73 y.o.   MRN: [id number]  HPI 73 year old white female, nonsmoker, patient of Dr. Sherren Mocha in, present today with complaints of sore throat, cough, congestion, wheezing x3 days and worsening. Reports cough is productive with white phlegm. Has been taking over-the-counter Benadryl, Alka-Seltzer cold and sinus, and Aleve without relief.    Review of Systems  Constitutional: Negative.   HENT: Positive for sneezing.   Respiratory: Negative.   Cardiovascular: Negative.   Musculoskeletal: Negative.   Skin: Negative.   Allergic/Immunologic: Negative.   Psychiatric/Behavioral: Negative.    Past Medical History  Diagnosis Date  . RLS (restless legs syndrome)   . Esophageal stricture 2010  . Osteopenia   . Hyperlipemia   . Arthritis   . Kidney stones   . GERD (gastroesophageal reflux disease)   . Substance abuse     RECOVERING ALCOLHOLIC  . Intrinsic asthma, unspecified     9/09 positive Methacholine challege test, normal spirometry at baseline  . Asthma     PT STATES SHE WAS TREATED FOR 2 YRS FOR ASTHMA--BUT NOW HER SX'S HAVE BEEN ATTRIBUTED TO ACID REFLUX / HIATAL HERNIA  . Alcoholism     HAS NOT HAD [place]  . Cataract   . 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 Marital Status: Widowed    Spouse Name: N/A    Number of Children: 0  . Years of Education: N/A   Occupational History  . Retired    Social History Main Topics  . Smoking status: Former Smoker -- 1.00 packs/day for 10 years    Types: Cigarettes    Quit date: 06/03/1970  . Smokeless tobacco: Never Used     Comment: Passive tobacco smoke exposure history  . Alcohol Use: No     Comment: RECOVERING ALCOLHOLIC  . Drug Use: No  . Sexual Activity: Not on file   Other Topics Concern  . Not on file   Social History Narrative   Regular exercise-YES    Past Surgical History  Procedure  Laterality Date  . Lumbar disc surgery  2008  . Facial cosmetic surgery      eyes, nose  . Abdominoplasty    . Liposuction  15 to 20 yrs ago  . Surgery for kidney stones    . Right index finger surgery  06/19/11    AT [facility]  . Laparoscopic nissen fundoplication  11/30/5282    Procedure: LAPAROSCOPIC NISSEN FUNDOPLICATION;  Surgeon: Pedro Earls, MD;  Location: [facility];  Service: General;  Laterality: N/A;  Laparoscopic Nissen repair of hiatal hernia  with lighted bougie  . Esophageal manometry  02/17/2012    Procedure: ESOPHAGEAL MANOMETRY (EM);  Surgeon: Pedro Earls, MD;  Location: [facility];  Service: Endoscopy;  Laterality: N/A;  . Dilation and curettage of uterus  many yrs ago    x 2  . 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 Joint replacement      left  . Open surgical repair of gluteal tendon Right 12/28/2012    Procedure: RIGHT GLUTEAL TENDON REPAIR;  Surgeon: Mauri Pole, MD;  Location: [facility];  Service: Orthopedics;  Laterality: Right;    Family History  Problem Relation Age  of Onset  . Throat cancer Father     Smoker  . Cancer Father     throat  . Heart disease Mother   . Hypertension Mother   . Hip fracture Mother   . Alzheimer's disease Mother   . Stroke Mother   . Stroke Brother 11    smoker    Allergies  Allergen Reactions  . Penicillins Hives, Shortness Of Breath and Itching  . Latex Other (See Comments)    Skin peeling (thumb)    Current Outpatient Prescriptions on File Prior to Visit  Medication Sig Dispense Refill  . calcium-vitamin D (OSCAL WITH D) 500-200 MG-UNIT per tablet Take 1 tablet by mouth 2 (two) times daily.      Marland Kitchen docusate sodium 100 MG CAPS Take 100 mg by mouth 2 (two) times daily.  10 capsule  0  . LORazepam (ATIVAN) 1 MG tablet TAKE 1 TABLET BY MOUTH EVERY EVENING  30 tablet   5  . methocarbamol (ROBAXIN) 500 MG tablet Take 1 tablet (500 mg total) by mouth every 6 (six) hours as needed (muscle spasms).  50 tablet  0  . oxyCODONE (OXY IR/ROXICODONE) 5 MG immediate release tablet Take 1-3 tablets (5-15 mg total) by mouth every 4 (four) hours as needed for pain.  100 tablet  0  . pramipexole (MIRAPEX) 1 MG tablet Take 1 tablet (1 mg total) by mouth 2 (two) times daily.  60 tablet  0   No current facility-administered medications on file prior to visit.    BP 140/82  Temp(Src) 97.6 F (36.4 C) (Oral)  Wt 191 lb (86.637 kg)chart    Objective:   Physical Exam  Constitutional: She is oriented to person, place, and time. She appears well-developed and well-nourished.  HENT:  Right Ear: External ear normal.  Left Ear: External ear normal.  Nose: Nose normal.  Mouth/Throat: Oropharynx is clear and moist.  Neck: Normal range of motion. Neck supple.  Cardiovascular: Normal rate, regular rhythm and normal heart sounds.   Pulmonary/Chest: Effort normal and breath sounds normal.  Neurological: She is alert and oriented to person, place, and time.  Skin: Skin is warm and dry.          Assessment & Plan:  Felicia Cruz was seen today for cough and sore throat.  Diagnoses and associated orders for this visit:  Acute bronchitis - methylPREDNISolone acetate (DEPO-MEDROL) injection 80 mg; Inject 2 mLs (80 mg total) into the muscle once.  Other Orders - guaiFENesin-codeine (CHERATUSSIN AC) 100-10 MG/5ML syrup; Take 5 mLs by mouth 3 (three) times daily as needed.   Declined prednisone taper. Call if symptoms worsen or persist.

## 2013-07-26 ENCOUNTER — Other Ambulatory Visit: Payer: Self-pay | Admitting: Family Medicine

## 2013-07-27 ENCOUNTER — Ambulatory Visit: Payer: Medicare Other | Admitting: Family Medicine

## 2013-07-27 ENCOUNTER — Ambulatory Visit (INDEPENDENT_AMBULATORY_CARE_PROVIDER_SITE_OTHER): Payer: Medicare Other | Admitting: Internal Medicine

## 2013-07-27 ENCOUNTER — Encounter: Payer: Self-pay | Admitting: Internal Medicine

## 2013-07-27 VITALS — BP 176/84 | HR 83 | Temp 97.7°F | Wt 191.0 lb

## 2013-07-27 DIAGNOSIS — J209 Acute bronchitis, unspecified: Secondary | ICD-10-CM | POA: Diagnosis not present

## 2013-07-27 MED ORDER — ALBUTEROL SULFATE HFA 108 (90 BASE) MCG/ACT IN AERS: 2.0000 | INHALATION_SPRAY | Freq: Four times a day (QID) | RESPIRATORY_TRACT | Status: DC | PRN

## 2013-07-27 MED ORDER — AZITHROMYCIN 250 MG PO TABS
ORAL_TABLET | ORAL | Status: DC
Start: 2013-07-27 — End: 2013-09-09

## 2013-07-27 NOTE — Progress Notes (Signed)
Pre visit review using our clinic review tool, if applicable. No additional management support is needed unless otherwise documented below in the visit note. 

## 2013-07-27 NOTE — Progress Notes (Signed)
Subjective:    Patient ID: Felicia Cruz, female    DOB: 07-04-1940, 73 y.o.   MRN: [id number]  DOS:  07/27/2013 Acute visit,  Sx started 07-16-13: Cough, wheezing, chest rattling, whitish sputum production. She was seen at the other office, got Depo-Medrol and codeine. She continue with about the same symptoms.    ROS Denies fever or chills. Reports  no energy No chest pain, occasional difficulty breathing. Denies GERD symptoms. No ST Some green nasal discharge.    Past Medical History  Diagnosis Date  . RLS (restless legs syndrome)   . Esophageal stricture 2010  . Osteopenia   . Hyperlipemia   . Arthritis   . Kidney stones   . GERD (gastroesophageal reflux disease)   . Substance abuse     RECOVERING ALCOLHOLIC  . Intrinsic asthma, unspecified     9/09 positive Methacholine challege test, normal spirometry at baseline  . Asthma     PT STATES SHE WAS TREATED FOR 2 YRS FOR ASTHMA--BUT NOW HER SX'S HAVE BEEN ATTRIBUTED TO ACID REFLUX / HIATAL HERNIA  . Alcoholism     HAS NOT HAD [place]  . Cataract   . 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 Lumbar disc surgery  2008  . Facial cosmetic surgery      eyes, nose  . Abdominoplasty    . Liposuction  15 to 20 yrs ago  . Surgery for kidney stones    . Right index finger surgery  06/19/11    AT [facility]  . Laparoscopic nissen fundoplication  5/78/4696    Procedure: LAPAROSCOPIC NISSEN FUNDOPLICATION;  Surgeon: Pedro Earls, MD;  Location: [facility];  Service: General;  Laterality: N/A;  Laparoscopic Nissen repair of hiatal hernia  with lighted bougie  . Esophageal manometry  02/17/2012    Procedure: ESOPHAGEAL MANOMETRY (EM);  Surgeon: Pedro Earls, MD;  Location: [facility];  Service: Endoscopy;  Laterality: N/A;  . Dilation and curettage of uterus  many yrs ago    x 2  . 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 Joint replacement      left  . Open surgical repair of gluteal tendon Right 12/28/2012    Procedure: RIGHT GLUTEAL TENDON REPAIR;  Surgeon: Mauri Pole, MD;  Location: [facility];  Service: Orthopedics;  Laterality: Right;    History   Social History  . Marital Status: Widowed    Spouse Name: N/A    Number of Children: 0  . Years of Education: N/A   Occupational History  . Retired    Social History Main Topics  . Smoking status: Former Smoker -- 1.00 packs/day for 10 years    Types: Cigarettes    Quit date: 06/03/1970  . Smokeless tobacco: Never Used     Comment: Passive tobacco smoke exposure history  . Alcohol Use: No     Comment: RECOVERING ALCOLHOLIC  . Drug Use: No  . Sexual Activity: Not on file   Other Topics Concern  . Not on file   Social History Narrative   Regular exercise-YES        Medication List       This list is accurate as of: 07/27/13 11:59 PM.  Always use your most recent med list.  albuterol 108 (90 BASE) MCG/ACT inhaler  Commonly known as:  VENTOLIN HFA  Inhale 2 puffs into the lungs every 6 (six) hours as needed for wheezing or shortness of breath.     aspirin 81 MG tablet  Take 81 mg by mouth daily.     azithromycin 250 MG tablet  Commonly known as:  ZITHROMAX Z-PAK  As directed     calcium-vitamin D 500-200 MG-UNIT per tablet  Commonly known as:  OSCAL WITH D  Take 1 tablet by mouth 2 (two) times daily.     LORazepam 1 MG tablet  Commonly known as:  ATIVAN  TAKE 1 TABLET BY MOUTH EVERY EVENING     naproxen sodium 220 MG tablet  Commonly known as:  ANAPROX  Take 220 mg by mouth 2 (two) times daily with a meal.     pramipexole 1 MG tablet  Commonly known as:  MIRAPEX  Take 1 tablet (1 mg total) by mouth 2 (two) times daily.           Objective:   Physical Exam BP 176/84  Pulse  83  Temp(Src) 97.7 F (36.5 C) (Oral)  Wt 191 lb (86.637 kg)  SpO2 99% General -- alert, well-developed, NAD.   HEENT-- Not pale. TMs bulge B, slt red on the L; Throat symmetric, no redness or discharge.  Face symmetric, sinuses not tender to palpation. Nose congested. Lungs -- normal respiratory effort, no intercostal retractions, no accessory muscle use, Few rhonchi, no wheezing or crackles at the time of exam Heart-- normal rate, regular rhythm, no murmur.  Neurologic--  alert & oriented X3. Speech normal Psych-- Cognition and judgment appear intact. Cooperative with normal attention span and concentration. No anxious or depressed appearing.      Assessment & Plan:  Bronchitis, Symptoms consistent with bronchitis and although the patient reports she "does not has asthma" and uses no inhalers, I think she will benefit from Symbicort and  Albuterol given reported wheezing. Plan--  See instructions  BP slt elevated today, but ok usually, pt aware, asked to monitor her BPs , states he will

## 2013-07-27 NOTE — Patient Instructions (Signed)
Rest, fluids , tylenol For cough, take Mucinex DM twice a day as needed  Take the  syrup with codeine if needed symbicort 2 puffs twice a day until sample is gone Albuterol as prescribed if cough-wheezing persist Take the antibiotic as prescribed  (zithromax ) Call if no better in few days Call anytime if the symptoms are severe

## 2013-09-09 ENCOUNTER — Ambulatory Visit (INDEPENDENT_AMBULATORY_CARE_PROVIDER_SITE_OTHER): Payer: Medicare Other | Admitting: Family Medicine

## 2013-09-09 ENCOUNTER — Encounter: Payer: Self-pay | Admitting: Family Medicine

## 2013-09-09 VITALS — BP 130/80 | Temp 98.3°F | Ht 66.0 in | Wt 193.0 lb

## 2013-09-09 DIAGNOSIS — E785 Hyperlipidemia, unspecified: Secondary | ICD-10-CM

## 2013-09-09 DIAGNOSIS — J45909 Unspecified asthma, uncomplicated: Secondary | ICD-10-CM

## 2013-09-09 DIAGNOSIS — G2581 Restless legs syndrome: Secondary | ICD-10-CM

## 2013-09-09 DIAGNOSIS — M899 Disorder of bone, unspecified: Secondary | ICD-10-CM

## 2013-09-09 DIAGNOSIS — Z23 Encounter for immunization: Secondary | ICD-10-CM | POA: Diagnosis not present

## 2013-09-09 DIAGNOSIS — S76019A Strain of muscle, fascia and tendon of unspecified hip, initial encounter: Secondary | ICD-10-CM

## 2013-09-09 DIAGNOSIS — K219 Gastro-esophageal reflux disease without esophagitis: Secondary | ICD-10-CM

## 2013-09-09 DIAGNOSIS — Z0000 Encounter for general adult medical examination without abnormal findings: Secondary | ICD-10-CM | POA: Diagnosis not present

## 2013-09-09 DIAGNOSIS — R32 Unspecified urinary incontinence: Secondary | ICD-10-CM

## 2013-09-09 DIAGNOSIS — Z96659 Presence of unspecified artificial knee joint: Secondary | ICD-10-CM

## 2013-09-09 DIAGNOSIS — R350 Frequency of micturition: Secondary | ICD-10-CM

## 2013-09-09 DIAGNOSIS — Z9889 Other specified postprocedural states: Secondary | ICD-10-CM

## 2013-09-09 DIAGNOSIS — M25562 Pain in left knee: Secondary | ICD-10-CM

## 2013-09-09 DIAGNOSIS — M949 Disorder of cartilage, unspecified: Secondary | ICD-10-CM

## 2013-09-09 LAB — LIPID PANEL
CHOL/HDL RATIO: 3
Cholesterol: 244 mg/dL — ABNORMAL HIGH (ref 0–200)
HDL: 92.3 mg/dL (ref >39.00)
LDL CALC: 130 mg/dL — AB (ref 0–99)
Triglycerides: 109 mg/dL (ref 0.0–149.0)
VLDL: 21.8 mg/dL (ref 0.0–40.0)

## 2013-09-09 LAB — CBC WITH DIFFERENTIAL/PLATELET
BASOS PCT: 0.6 % (ref 0.0–3.0)
Basophils Absolute: 0 10*3/uL (ref 0.0–0.1)
EOS ABS: 0.2 10*3/uL (ref 0.0–0.7)
Eosinophils Relative: 3.6 % (ref 0.0–5.0)
HEMATOCRIT: 39.3 % (ref 36.0–46.0)
HEMOGLOBIN: 13.1 g/dL (ref 12.0–15.0)
LYMPHS ABS: 2 10*3/uL (ref 0.7–4.0)
Lymphocytes Relative: 32.3 % (ref 12.0–46.0)
MCHC: 33.4 g/dL (ref 30.0–36.0)
MCV: 86.9 fl (ref 78.0–100.0)
MONO ABS: 0.4 10*3/uL (ref 0.1–1.0)
Monocytes Relative: 6.8 % (ref 3.0–12.0)
NEUTROS ABS: 3.5 10*3/uL (ref 1.4–7.7)
Neutrophils Relative %: 56.7 % (ref 43.0–77.0)
Platelets: 336 10*3/uL (ref 150.0–400.0)
RBC: 4.52 Mil/uL (ref 3.87–5.11)
RDW: 14.8 % — ABNORMAL HIGH (ref 11.5–14.6)
WBC: 6.2 10*3/uL (ref 4.5–10.5)

## 2013-09-09 LAB — POCT URINALYSIS DIPSTICK
Bilirubin, UA: NEGATIVE
Glucose, UA: NEGATIVE
KETONES UA: NEGATIVE
Nitrite, UA: NEGATIVE
PROTEIN UA: NEGATIVE
RBC UA: NEGATIVE
SPEC GRAV UA: 1.02
UROBILINOGEN UA: 0.2
pH, UA: 7

## 2013-09-09 LAB — BASIC METABOLIC PANEL
BUN: 14 mg/dL (ref 6–23)
CALCIUM: 9.4 mg/dL (ref 8.4–10.5)
CHLORIDE: 104 meq/L (ref 96–112)
CO2: 29 meq/L (ref 19–32)
CREATININE: 0.5 mg/dL (ref 0.4–1.2)
GFR: 123 mL/min (ref >60.00)
GLUCOSE: 89 mg/dL (ref 70–99)
Potassium: 4.5 mEq/L (ref 3.5–5.1)
Sodium: 138 mEq/L (ref 135–145)

## 2013-09-09 LAB — HEPATIC FUNCTION PANEL
ALT: 14 U/L (ref 0–35)
AST: 15 U/L (ref 0–37)
Albumin: 4.2 g/dL (ref 3.5–5.2)
Alkaline Phosphatase: 52 U/L (ref 39–117)
BILIRUBIN DIRECT: 0 mg/dL (ref 0.0–0.3)
BILIRUBIN TOTAL: 0.6 mg/dL (ref 0.3–1.2)
Total Protein: 7 g/dL (ref 6.0–8.3)

## 2013-09-09 LAB — TSH: TSH: 0.36 u[IU]/mL (ref 0.35–5.50)

## 2013-09-09 MED ORDER — SULFAMETHOXAZOLE-TMP DS 800-160 MG PO TABS
1.0000 | ORAL_TABLET | Freq: Two times a day (BID) | ORAL | Status: DC
Start: 2013-09-09 — End: 2013-10-14

## 2013-09-09 MED ORDER — LORAZEPAM 1 MG PO TABS: ORAL_TABLET | ORAL | Status: DC

## 2013-09-09 MED ORDER — PRAMIPEXOLE DIHYDROCHLORIDE 1 MG PO TABS
ORAL_TABLET | ORAL | Status: DC
Start: 2013-09-09 — End: 2013-10-14

## 2013-09-09 NOTE — Patient Instructions (Signed)
Continue your current medications  Followup in one year for general Medicare wellness examination sooner if any problem  Good luck on your hip surgery in May  Stay away  from bats !!!!!!!!!!!!!!!!

## 2013-09-09 NOTE — Progress Notes (Signed)
   Subjective:    Patient ID: Felicia Cruz, female    DOB: November 22, 1940, 73 y.o.   MRN: [id number]  HPI Felicia Cruz is a 73 year old widowed female nonsmoker,,,,,,, nondrinker x10 years,,,,,,, who comes in today for a Medicare wellness examination  She takes albuterol when necessary for asthma  She takes Ativan 1 mg each bedtime for sleep  She takes Naprosyn 250 mg twice a day for arthritis and joint pain  She takes Mirapex 1 mg each bedtime for restless leg syndrome  She gets routine eye care...Marland KitchenMarland KitchenMarland Kitchen bilateral cataracts..... regular dental care, BSE monthly, and you mammography, recent colonoscopy in 2012 was normal. She was found to have a significant hiatal hernia. She underwent surgical repair this has not helped her chronic cough.  She had the right gluteal tendon repaired here by Dr. Charm Rings however she continues to have problems walking. She should have a second procedure may 6 at [facility].  Cognitive function is normal she cannot walk well because of a hip problem, home health safety reviewed no issues identified, no guns in the house, she does have a health care power of attorney and living well   Review of Systems  Constitutional: Negative.   HENT: Negative.   Eyes: Negative.   Respiratory: Negative.   Cardiovascular: Negative.   Gastrointestinal: Negative.   Genitourinary: Negative.   Musculoskeletal: Negative.   Neurological: Negative.   Psychiatric/Behavioral: Negative.        Objective:   Physical Exam  Nursing note and vitals reviewed. Constitutional: She appears well-developed and well-nourished.  HENT:  Head: Normocephalic and atraumatic.  Right Ear: External ear normal.  Left Ear: External ear normal.  Nose: Nose normal.  Mouth/Throat: Oropharynx is clear and moist.  Eyes: EOM are normal. Pupils are equal, round, and reactive to light.  Neck: Normal range of motion. Neck supple. No thyromegaly present.  Cardiovascular: Normal rate, regular rhythm, normal heart sounds  and intact distal pulses.  Exam reveals no gallop and no friction rub.   No murmur heard. No carotid artery bruits peripheral pulses 2+ and symmetrical  Pulmonary/Chest: Effort normal and breath sounds normal.  Abdominal: Soft. Bowel sounds are normal. She exhibits no distension and no mass. There is no tenderness. There is no rebound.  Genitourinary:  Bilateral breast exam normal  LMP at age 39 after she had a D&C. All her Paps and pelvics and and normal in the past.  Musculoskeletal: Normal range of motion.  Lymphadenopathy:    She has no cervical adenopathy.  Neurological: She is alert. She has normal reflexes. No cranial nerve deficit. She exhibits normal muscle tone. Coordination normal.  Skin: Skin is warm and dry.  Total body skin exam normal,,,,,,, scar right lateral thigh from previous hip surgery  Psychiatric: She has a normal mood and affect. Her behavior is normal. Judgment and thought content normal.          Assessment & Plan:  Healthy female  Occasional asthma albuterol when necessary  Sleep dysfunction,,,,,,,,, Ativan each bedtime  Joint pain left knee pain ,,,,,,, status post left knee replacement ,,,,,, Naprosyn 250 twice a day  Restless leg syndrome ,,,,,,,,, Mirapex 1 mg each bedtime  Bilateral cataracts ...Marland KitchenMarland KitchenMarland Kitchen followed by ophthalmology  Multiple plastic surgical procedures  Right hip pain ....... surgery at [facility] may 6 for surgical repair of the tendon k

## 2013-09-09 NOTE — Progress Notes (Signed)
Pre visit review using our clinic review tool, if applicable. No additional management support is needed unless otherwise documented below in the visit note. 

## 2013-09-16 ENCOUNTER — Encounter: Payer: Self-pay | Admitting: Family Medicine

## 2013-09-16 MED ORDER — CIPROFLOXACIN HCL 250 MG PO TABS: 250.0000 mg | ORAL_TABLET | Freq: Two times a day (BID) | ORAL | Status: DC

## 2013-09-23 DIAGNOSIS — R635 Abnormal weight gain: Secondary | ICD-10-CM | POA: Diagnosis not present

## 2013-09-23 DIAGNOSIS — Z01818 Encounter for other preprocedural examination: Secondary | ICD-10-CM | POA: Diagnosis not present

## 2013-09-23 DIAGNOSIS — N390 Urinary tract infection, site not specified: Secondary | ICD-10-CM | POA: Diagnosis not present

## 2013-09-23 DIAGNOSIS — M25559 Pain in unspecified hip: Secondary | ICD-10-CM | POA: Diagnosis not present

## 2013-09-23 DIAGNOSIS — G2581 Restless legs syndrome: Secondary | ICD-10-CM | POA: Diagnosis not present

## 2013-10-06 DIAGNOSIS — IMO0002 Reserved for concepts with insufficient information to code with codable children: Secondary | ICD-10-CM | POA: Diagnosis present

## 2013-10-06 DIAGNOSIS — R635 Abnormal weight gain: Secondary | ICD-10-CM | POA: Diagnosis present

## 2013-10-06 DIAGNOSIS — M76899 Other specified enthesopathies of unspecified lower limb, excluding foot: Secondary | ICD-10-CM | POA: Diagnosis not present

## 2013-10-06 DIAGNOSIS — G2581 Restless legs syndrome: Secondary | ICD-10-CM | POA: Diagnosis present

## 2013-10-06 DIAGNOSIS — M25559 Pain in unspecified hip: Secondary | ICD-10-CM | POA: Diagnosis not present

## 2013-10-06 DIAGNOSIS — Z9889 Other specified postprocedural states: Secondary | ICD-10-CM | POA: Diagnosis not present

## 2013-10-12 ENCOUNTER — Ambulatory Visit: Payer: Medicare Other | Admitting: Physical Therapy

## 2013-10-12 DIAGNOSIS — M25559 Pain in unspecified hip: Secondary | ICD-10-CM | POA: Insufficient documentation

## 2013-10-12 DIAGNOSIS — IMO0001 Reserved for inherently not codable concepts without codable children: Secondary | ICD-10-CM | POA: Insufficient documentation

## 2013-10-12 DIAGNOSIS — M25659 Stiffness of unspecified hip, not elsewhere classified: Secondary | ICD-10-CM

## 2013-10-12 DIAGNOSIS — J9819 Other pulmonary collapse: Secondary | ICD-10-CM | POA: Diagnosis not present

## 2013-10-12 DIAGNOSIS — I2699 Other pulmonary embolism without acute cor pulmonale: Secondary | ICD-10-CM | POA: Diagnosis not present

## 2013-10-12 DIAGNOSIS — R262 Difficulty in walking, not elsewhere classified: Secondary | ICD-10-CM | POA: Insufficient documentation

## 2013-10-12 DIAGNOSIS — J90 Pleural effusion, not elsewhere classified: Secondary | ICD-10-CM | POA: Diagnosis not present

## 2013-10-14 ENCOUNTER — Encounter (HOSPITAL_COMMUNITY): Payer: Self-pay | Admitting: Emergency Medicine

## 2013-10-14 ENCOUNTER — Emergency Department (HOSPITAL_COMMUNITY): Payer: Medicare Other

## 2013-10-14 ENCOUNTER — Inpatient Hospital Stay (HOSPITAL_COMMUNITY)
Admission: EM | Admit: 2013-10-14 | Discharge: 2013-10-20 | DRG: 175 | Disposition: A | Discharge status: Home / Self Care | Payer: Medicare Other | Attending: Internal Medicine | Admitting: Internal Medicine

## 2013-10-14 DIAGNOSIS — K219 Gastro-esophageal reflux disease without esophagitis: Secondary | ICD-10-CM | POA: Diagnosis present

## 2013-10-14 DIAGNOSIS — R350 Frequency of micturition: Secondary | ICD-10-CM

## 2013-10-14 DIAGNOSIS — F3289 Other specified depressive episodes: Secondary | ICD-10-CM | POA: Diagnosis present

## 2013-10-14 DIAGNOSIS — J45909 Unspecified asthma, uncomplicated: Secondary | ICD-10-CM | POA: Diagnosis present

## 2013-10-14 DIAGNOSIS — Z79899 Other long term (current) drug therapy: Secondary | ICD-10-CM | POA: Diagnosis not present

## 2013-10-14 DIAGNOSIS — K222 Esophageal obstruction: Secondary | ICD-10-CM | POA: Diagnosis present

## 2013-10-14 DIAGNOSIS — R05 Cough: Secondary | ICD-10-CM | POA: Diagnosis not present

## 2013-10-14 DIAGNOSIS — Z7982 Long term (current) use of aspirin: Secondary | ICD-10-CM | POA: Diagnosis not present

## 2013-10-14 DIAGNOSIS — J9600 Acute respiratory failure, unspecified whether with hypoxia or hypercapnia: Secondary | ICD-10-CM | POA: Diagnosis not present

## 2013-10-14 DIAGNOSIS — E663 Overweight: Secondary | ICD-10-CM | POA: Diagnosis present

## 2013-10-14 DIAGNOSIS — G2581 Restless legs syndrome: Secondary | ICD-10-CM | POA: Diagnosis present

## 2013-10-14 DIAGNOSIS — J9819 Other pulmonary collapse: Secondary | ICD-10-CM | POA: Diagnosis not present

## 2013-10-14 DIAGNOSIS — E785 Hyperlipidemia, unspecified: Secondary | ICD-10-CM | POA: Diagnosis present

## 2013-10-14 DIAGNOSIS — R059 Cough, unspecified: Secondary | ICD-10-CM | POA: Diagnosis not present

## 2013-10-14 DIAGNOSIS — R32 Unspecified urinary incontinence: Secondary | ICD-10-CM

## 2013-10-14 DIAGNOSIS — M899 Disorder of bone, unspecified: Secondary | ICD-10-CM | POA: Diagnosis present

## 2013-10-14 DIAGNOSIS — R053 Chronic cough: Secondary | ICD-10-CM

## 2013-10-14 DIAGNOSIS — D649 Anemia, unspecified: Secondary | ICD-10-CM | POA: Diagnosis present

## 2013-10-14 DIAGNOSIS — Z880 Allergy status to penicillin: Secondary | ICD-10-CM

## 2013-10-14 DIAGNOSIS — F411 Generalized anxiety disorder: Secondary | ICD-10-CM | POA: Diagnosis present

## 2013-10-14 DIAGNOSIS — R079 Chest pain, unspecified: Secondary | ICD-10-CM | POA: Diagnosis not present

## 2013-10-14 DIAGNOSIS — E871 Hypo-osmolality and hyponatremia: Secondary | ICD-10-CM | POA: Diagnosis present

## 2013-10-14 DIAGNOSIS — M949 Disorder of cartilage, unspecified: Secondary | ICD-10-CM

## 2013-10-14 DIAGNOSIS — F1020 Alcohol dependence, uncomplicated: Secondary | ICD-10-CM | POA: Diagnosis present

## 2013-10-14 DIAGNOSIS — Z09 Encounter for follow-up examination after completed treatment for conditions other than malignant neoplasm: Secondary | ICD-10-CM | POA: Diagnosis not present

## 2013-10-14 DIAGNOSIS — D62 Acute posthemorrhagic anemia: Secondary | ICD-10-CM

## 2013-10-14 DIAGNOSIS — F329 Major depressive disorder, single episode, unspecified: Secondary | ICD-10-CM | POA: Diagnosis present

## 2013-10-14 DIAGNOSIS — M25562 Pain in left knee: Secondary | ICD-10-CM

## 2013-10-14 DIAGNOSIS — Z87891 Personal history of nicotine dependence: Secondary | ICD-10-CM | POA: Diagnosis not present

## 2013-10-14 DIAGNOSIS — Z96659 Presence of unspecified artificial knee joint: Secondary | ICD-10-CM

## 2013-10-14 DIAGNOSIS — Z9889 Other specified postprocedural states: Secondary | ICD-10-CM

## 2013-10-14 DIAGNOSIS — Z6825 Body mass index (BMI) 25.0-25.9, adult: Secondary | ICD-10-CM | POA: Diagnosis not present

## 2013-10-14 DIAGNOSIS — Z888 Allergy status to other drugs, medicaments and biological substances status: Secondary | ICD-10-CM

## 2013-10-14 DIAGNOSIS — S76019A Strain of muscle, fascia and tendon of unspecified hip, initial encounter: Secondary | ICD-10-CM

## 2013-10-14 DIAGNOSIS — IMO0002 Reserved for concepts with insufficient information to code with codable children: Secondary | ICD-10-CM | POA: Diagnosis not present

## 2013-10-14 DIAGNOSIS — M129 Arthropathy, unspecified: Secondary | ICD-10-CM | POA: Diagnosis present

## 2013-10-14 DIAGNOSIS — I369 Nonrheumatic tricuspid valve disorder, unspecified: Secondary | ICD-10-CM | POA: Diagnosis not present

## 2013-10-14 DIAGNOSIS — E876 Hypokalemia: Secondary | ICD-10-CM | POA: Diagnosis present

## 2013-10-14 DIAGNOSIS — J90 Pleural effusion, not elsewhere classified: Secondary | ICD-10-CM | POA: Diagnosis not present

## 2013-10-14 DIAGNOSIS — Z6832 Body mass index (BMI) 32.0-32.9, adult: Secondary | ICD-10-CM | POA: Diagnosis present

## 2013-10-14 DIAGNOSIS — I2699 Other pulmonary embolism without acute cor pulmonale: Secondary | ICD-10-CM | POA: Diagnosis not present

## 2013-10-14 DIAGNOSIS — K5900 Constipation, unspecified: Secondary | ICD-10-CM

## 2013-10-14 DIAGNOSIS — F32A Depression, unspecified: Secondary | ICD-10-CM | POA: Diagnosis present

## 2013-10-14 DIAGNOSIS — K449 Diaphragmatic hernia without obstruction or gangrene: Secondary | ICD-10-CM | POA: Diagnosis present

## 2013-10-14 LAB — BASIC METABOLIC PANEL
BUN: 11 mg/dL (ref 6–23)
CALCIUM: 9.1 mg/dL (ref 8.4–10.5)
CO2: 22 mEq/L (ref 19–32)
CREATININE: 0.66 mg/dL (ref 0.50–1.10)
Chloride: 101 mEq/L (ref 96–112)
GFR calc Af Amer: >90 mL/min (ref >90)
GFR, EST NON AFRICAN AMERICAN: 86 mL/min — AB (ref >90)
GLUCOSE: 114 mg/dL — AB (ref 70–99)
Potassium: 3.9 mEq/L (ref 3.7–5.3)
SODIUM: 139 meq/L (ref 137–147)

## 2013-10-14 LAB — PROTIME-INR
INR: 1.11 (ref 0.00–1.49)
Prothrombin Time: 14.1 seconds (ref 11.6–15.2)

## 2013-10-14 LAB — I-STAT TROPONIN, ED: Troponin i, poc: 0 ng/mL (ref 0.00–0.08)

## 2013-10-14 LAB — CBC
HCT: 30.4 % — ABNORMAL LOW (ref 36.0–46.0)
HEMOGLOBIN: 10.1 g/dL — AB (ref 12.0–15.0)
MCH: 28.6 pg (ref 26.0–34.0)
MCHC: 33.2 g/dL (ref 30.0–36.0)
MCV: 86.1 fL (ref 78.0–100.0)
PLATELETS: 294 10*3/uL (ref 150–400)
RBC: 3.53 MIL/uL — ABNORMAL LOW (ref 3.87–5.11)
RDW: 15.7 % — ABNORMAL HIGH (ref 11.5–15.5)
WBC: 9.3 10*3/uL (ref 4.0–10.5)

## 2013-10-14 MED ORDER — PANTOPRAZOLE SODIUM 40 MG PO TBEC
40.0000 mg | DELAYED_RELEASE_TABLET | Freq: Every day | ORAL | Status: DC
  Administered 2013-10-15 – 2013-10-20 (×6): 40 mg via ORAL
  Filled 2013-10-14 (×6): qty 1

## 2013-10-14 MED ORDER — ONDANSETRON HCL 4 MG PO TABS
4.0000 mg | ORAL_TABLET | Freq: Four times a day (QID) | ORAL | Status: DC | PRN
  Administered 2013-10-18: 4 mg via ORAL
  Filled 2013-10-14: qty 1

## 2013-10-14 MED ORDER — SENNOSIDES-DOCUSATE SODIUM 8.6-50 MG PO TABS
2.0000 | ORAL_TABLET | Freq: Two times a day (BID) | ORAL | Status: DC
  Administered 2013-10-15 – 2013-10-20 (×8): 2 via ORAL
  Filled 2013-10-14 (×13): qty 2

## 2013-10-14 MED ORDER — ALBUTEROL SULFATE (2.5 MG/3ML) 0.083% IN NEBU: 2.5000 mg | INHALATION_SOLUTION | RESPIRATORY_TRACT | Status: DC | PRN

## 2013-10-14 MED ORDER — HEPARIN BOLUS VIA INFUSION
5000.0000 [IU] | Freq: Once | INTRAVENOUS | Status: AC
  Administered 2013-10-14: 5000 [IU] via INTRAVENOUS
  Filled 2013-10-14: qty 5000

## 2013-10-14 MED ORDER — SENNA-DOCUSATE SODIUM 8.6-50 MG PO TABS: 2.0000 | ORAL_TABLET | Freq: Two times a day (BID) | ORAL | Status: DC

## 2013-10-14 MED ORDER — CALCIUM CARBONATE ANTACID 500 MG PO CHEW
1.0000 | CHEWABLE_TABLET | Freq: Every day | ORAL | Status: DC
  Administered 2013-10-15 – 2013-10-20 (×5): 200 mg via ORAL
  Filled 2013-10-14 (×6): qty 1

## 2013-10-14 MED ORDER — OXYCODONE HCL ER 10 MG PO T12A
10.0000 mg | EXTENDED_RELEASE_TABLET | Freq: Two times a day (BID) | ORAL | Status: DC
Start: 2013-10-14 — End: 2013-10-20
  Administered 2013-10-15 – 2013-10-20 (×12): 10 mg via ORAL
  Filled 2013-10-14 (×12): qty 1

## 2013-10-14 MED ORDER — LORAZEPAM 1 MG PO TABS
1.0000 mg | ORAL_TABLET | Freq: Every evening | ORAL | Status: DC | PRN
  Filled 2013-10-14: qty 1

## 2013-10-14 MED ORDER — ONDANSETRON HCL 4 MG/2ML IJ SOLN: 4.0000 mg | Freq: Four times a day (QID) | INTRAMUSCULAR | Status: DC | PRN

## 2013-10-14 MED ORDER — GUAIFENESIN-DM 100-10 MG/5ML PO SYRP: 5.0000 mL | ORAL_SOLUTION | ORAL | Status: DC | PRN

## 2013-10-14 MED ORDER — BISACODYL 5 MG PO TBEC
10.0000 mg | DELAYED_RELEASE_TABLET | Freq: Every day | ORAL | Status: DC | PRN
  Administered 2013-10-15: 10 mg via ORAL
  Filled 2013-10-14: qty 2

## 2013-10-14 MED ORDER — ACETAMINOPHEN 650 MG RE SUPP
650.0000 mg | Freq: Four times a day (QID) | RECTAL | Status: DC | PRN
  Filled 2013-10-14: qty 1

## 2013-10-14 MED ORDER — SODIUM CHLORIDE 0.9 % IJ SOLN
3.0000 mL | Freq: Two times a day (BID) | INTRAMUSCULAR | Status: DC
  Administered 2013-10-18 – 2013-10-19 (×4): 3 mL via INTRAVENOUS

## 2013-10-14 MED ORDER — HEPARIN (PORCINE) IN NACL 100-0.45 UNIT/ML-% IJ SOLN
1650.0000 [IU]/h | INTRAMUSCULAR | Status: AC
  Administered 2013-10-14: 1500 [IU]/h via INTRAVENOUS
  Administered 2013-10-15 – 2013-10-18 (×5): 1650 [IU]/h via INTRAVENOUS
  Filled 2013-10-14 (×8): qty 250

## 2013-10-14 MED ORDER — IOHEXOL 350 MG/ML SOLN
100.0000 mL | Freq: Once | INTRAVENOUS | Status: AC | PRN
  Administered 2013-10-14: 100 mL via INTRAVENOUS

## 2013-10-14 MED ORDER — ACETAMINOPHEN 325 MG PO TABS
650.0000 mg | ORAL_TABLET | Freq: Four times a day (QID) | ORAL | Status: DC | PRN
  Administered 2013-10-17 – 2013-10-18 (×2): 650 mg via ORAL
  Filled 2013-10-14 (×2): qty 2

## 2013-10-14 MED ORDER — PRAMIPEXOLE DIHYDROCHLORIDE 1 MG PO TABS
1.0000 mg | ORAL_TABLET | Freq: Every day | ORAL | Status: DC
  Administered 2013-10-15 – 2013-10-16 (×3): 1 mg via ORAL
  Filled 2013-10-14 (×4): qty 1

## 2013-10-14 MED ORDER — LORAZEPAM 1 MG PO TABS
1.0000 mg | ORAL_TABLET | Freq: Three times a day (TID) | ORAL | Status: DC
  Administered 2013-10-15 – 2013-10-19 (×9): 1 mg via ORAL
  Filled 2013-10-14 (×9): qty 1

## 2013-10-14 MED ORDER — PREGABALIN 50 MG PO CAPS
150.0000 mg | ORAL_CAPSULE | Freq: Three times a day (TID) | ORAL | Status: DC
  Administered 2013-10-15 – 2013-10-20 (×14): 150 mg via ORAL
  Filled 2013-10-14 (×11): qty 3
  Filled 2013-10-14: qty 6
  Filled 2013-10-14 (×2): qty 3

## 2013-10-14 NOTE — ED Provider Notes (Signed)
CSN: [id number]     Arrival date & time 10/14/13  1853 History   First MD Initiated Contact with Patient 10/14/13 1923     Chief Complaint  Patient presents with  . Shortness of Breath     (Consider location/radiation/quality/duration/timing/severity/associated sxs/prior Treatment) HPI 73 year old female one week postop from a right hip surgery, right hip is doing well with pain medication she is no hip pain now, she presents with left-sided pleuritic chest pain that developed yesterday evening and has been present all night last night and all day today sharp stabbing pleuritic positional nonexertional nonradiating but does have mild shortness of breath and mild cough with no fever no syncope no abdominal pain no bloody stools no leg swelling or leg pain other than her expected right hip postoperative pain and no other concerns. She tried her albuterol inhaler which did not help her pain or shortness of breath and her postoperative pain medicines help the pain; she does not want pain medicine in the emergency department upon initial examination. Past Medical History  Diagnosis Date  . RLS (restless legs syndrome)   . Esophageal stricture 2010  . Osteopenia   . Hyperlipemia   . Arthritis   . Kidney stones   . GERD (gastroesophageal reflux disease)   . Substance abuse     RECOVERING ALCOLHOLIC  . Intrinsic asthma, unspecified     9/09 positive Methacholine challege test, normal spirometry at baseline  . Asthma     PT STATES SHE WAS TREATED FOR 2 YRS FOR ASTHMA--BUT NOW HER SX'S HAVE BEEN ATTRIBUTED TO ACID REFLUX / HIATAL HERNIA  . Alcoholism     HAS NOT HAD [place]  . Cataract   . 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 Lumbar disc surgery  2008  . Facial cosmetic surgery      eyes, nose  . Abdominoplasty    . Liposuction  15 to 20 yrs ago  . Surgery for kidney stones    . Right index finger surgery   06/19/11    AT [facility]  . Laparoscopic nissen fundoplication  12/04/5007    Procedure: LAPAROSCOPIC NISSEN FUNDOPLICATION;  Surgeon: Pedro Earls, MD;  Location: [facility];  Service: General;  Laterality: N/A;  Laparoscopic Nissen repair of hiatal hernia  with lighted bougie  . Esophageal manometry  02/17/2012    Procedure: ESOPHAGEAL MANOMETRY (EM);  Surgeon: Pedro Earls, MD;  Location: [facility];  Service: Endoscopy;  Laterality: N/A;  . Dilation and curettage of uterus  many yrs ago    x 2  . 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 Joint replacement      left  . Open surgical repair of gluteal tendon Right 12/28/2012    Procedure: RIGHT GLUTEAL TENDON REPAIR;  Surgeon: Mauri Pole, MD;  Location: [facility];  Service: Orthopedics;  Laterality: Right;   Family History  Problem Relation Age of Onset  . Throat cancer Father     Smoker  . Cancer Father     throat  . Heart disease Mother   . Hypertension Mother   . Hip fracture Mother   . Alzheimer's disease Mother   . Stroke Mother   . Stroke Brother 49    smoker   History  Substance  Use Topics  . Smoking status: Former Smoker -- 1.00 packs/day for 10 years    Types: Cigarettes    Quit date: 06/03/1970  . Smokeless tobacco: Never Used     Comment: Passive tobacco smoke exposure history  . Alcohol Use: No     Comment: RECOVERING ALCOLHOLIC   OB History   Grav Para Term Preterm Abortions TAB SAB Ect Mult Living                 Review of Systems 10 Systems reviewed and are negative for acute change except as noted in the HPI.   Allergies  Penicillins; Latex; and Lipitor  Home Medications   Prior to Admission medications   Medication Sig Start Date End Date Taking? Authorizing Provider  albuterol (VENTOLIN HFA) 108 (90 BASE) MCG/ACT inhaler Inhale 2 puffs into the  lungs every 6 (six) hours as needed for wheezing or shortness of breath. 07/27/13   Colon Branch, MD  aspirin 81 MG tablet Take 81 mg by mouth daily.    Historical Provider, MD  Calcium 500 MG CHEW Chew by mouth.    Historical Provider, MD  calcium-vitamin D (OSCAL WITH D) 500-200 MG-UNIT per tablet Take 1 tablet by mouth 2 (two) times daily.    Historical Provider, MD  ciprofloxacin (CIPRO) 250 MG tablet Take 1 tablet (250 mg total) by mouth 2 (two) times daily. 09/16/13   Dorena Cookey, MD  ibuprofen (ADVIL,MOTRIN) 200 MG tablet Take 200 mg by mouth every 6 (six) hours as needed.    Historical Provider, MD  LORazepam (ATIVAN) 1 MG tablet TAKE 1 TABLET BY MOUTH EVERY EVENING 09/09/13   Dorena Cookey, MD  naproxen sodium (ANAPROX) 220 MG tablet Take 220 mg by mouth 2 (two) times daily with a meal.    Historical Provider, MD  pramipexole (MIRAPEX) 1 MG tablet 1 by mouth each bedtime 09/09/13   Dorena Cookey, MD  sulfamethoxazole-trimethoprim (BACTRIM DS) 800-160 MG per tablet Take 1 tablet by mouth 2 (two) times daily. 09/09/13   Dorena Cookey, MD   BP 128/52  Pulse 63  Temp(Src) 98.1 F (36.7 C) (Oral)  Resp 17  Ht 5\' 6"  (1.676 m)  Wt 199 lb 3.2 oz (90.357 kg)  BMI 32.17 kg/m2  SpO2 97% Physical Exam  Nursing note and vitals reviewed. Constitutional:  Awake, alert, nontoxic appearance.  HENT:  Head: Atraumatic.  Eyes: Right eye exhibits no discharge. Left eye exhibits no discharge.  Neck: Neck supple.  Cardiovascular: Normal rate and regular rhythm.   No murmur heard. Pulmonary/Chest: Effort normal and breath sounds normal. No respiratory distress. She has no wheezes. She has no rales. She exhibits no tenderness.  No rash noted  Abdominal: Soft. Bowel sounds are normal. She exhibits no distension and no mass. There is no tenderness. There is no rebound and no guarding.  Musculoskeletal: She exhibits no edema and no tenderness.  Baseline ROM, no obvious new focal weakness.  Neurological:  She is alert.  Mental status and motor strength appears baseline for patient and situation.  Skin: No rash noted.  Psychiatric: She has a normal mood and affect.    ED Course  Procedures (including critical care time) Pt stable in ED with no significant deterioration in condition. CRITICAL CARE Performed by: Babette Relic  Total critical care time: 41min d/w PCCM and [facility] due to right heart strain and consideration of lytics; since hemodynamically stable, no lytics felt emergently indicated. Patient / Family /  Caregiver informed of clinical course, understand medical decision-making process, and agree with plan. Critical care time was exclusive of separately billable procedures and treating other patients. Critical care was necessary to treat or prevent imminent or life-threatening deterioration. Critical care was time spent personally by me on the following activities: development of treatment plan with patient and/or surrogate as well as nursing, discussions with consultants, evaluation of patient's response to treatment, examination of patient, obtaining history from patient or surrogate, ordering and performing treatments and interventions, ordering and review of laboratory studies, ordering and review of radiographic studies, pulse oximetry and re-evaluation of patient's condition. Labs Review Labs Reviewed  CBC - Abnormal; Notable for the following:    RBC 3.53 (*)    Hemoglobin 10.1 (*)    HCT 30.4 (*)    RDW 15.7 (*)    All other components within normal limits  BASIC METABOLIC PANEL - Abnormal; Notable for the following:    Glucose, Bld 114 (*)    GFR calc non Af Amer 86 (*)    All other components within normal limits  CBC - Abnormal; Notable for the following:    RBC 3.31 (*)    Hemoglobin 9.3 (*)    HCT 28.7 (*)    RDW 15.7 (*)    All other components within normal limits  BASIC METABOLIC PANEL - Abnormal; Notable for the following:    Sodium 134 (*)    Potassium 3.5  (*)    Glucose, Bld 128 (*)    GFR calc non Af Amer 87 (*)    All other components within normal limits  IRON AND TIBC - Abnormal; Notable for the following:    Iron 19 (*)    Saturation Ratios 7 (*)    All other components within normal limits  RETICULOCYTES - Abnormal; Notable for the following:    RBC. 3.31 (*)    All other components within normal limits  CBC - Abnormal; Notable for the following:    RBC 3.10 (*)    Hemoglobin 8.9 (*)    HCT 27.4 (*)    RDW 15.6 (*)    All other components within normal limits  BASIC METABOLIC PANEL - Abnormal; Notable for the following:    GFR calc non Af Amer 88 (*)    All other components within normal limits  CBC - Abnormal; Notable for the following:    RBC 3.42 (*)    Hemoglobin 9.8 (*)    HCT 30.8 (*)    All other components within normal limits  PROTIME-INR  HEPARIN LEVEL (UNFRACTIONATED)  VITAMIN B12  FOLATE  FERRITIN  HEPARIN LEVEL (UNFRACTIONATED)  HEPARIN LEVEL (UNFRACTIONATED)  PROTIME-INR  HEPARIN LEVEL (UNFRACTIONATED)  PROTIME-INR  BASIC METABOLIC PANEL  I-STAT TROPOININ, ED    Imaging Review No results found. Dg Chest 2 View  10/14/2013   CLINICAL DATA:  Shortness of breath.  EXAM: CHEST  2 VIEW  COMPARISON:  Two-view chest x-ray 12/25/2010, 01/04/2008.  FINDINGS: Suboptimal inspiration accounts for crowded bronchovascular markings, especially in the bases, and accentuates the cardiac silhouette. Taking this into account, cardiac silhouette upper normal in size. Thoracic aorta mildly tortuous, unchanged. Hilar and mediastinal contours otherwise unremarkable. Mild atelectasis in the left lower lobe. Lungs otherwise clear. No localized airspace consolidation. No pleural effusions. No pneumothorax. Normal pulmonary vascularity. Degenerative changes involving the thoracic spine.  IMPRESSION: Suboptimal inspiration accounts for mild left lower lobe atelectasis. No acute cardiopulmonary disease otherwise.   Electronically  Signed   By:  Evangeline Dakin M.D.   On: 10/14/2013 20:14   Ct Angio Chest Pe W/cm &/or Wo Cm  10/14/2013   CLINICAL DATA:  Recent hip surgery, acute onset severe shortness of breath not relieved with inhaler use, left-sided body pain.  EXAM: CT ANGIOGRAPHY CHEST WITH CONTRAST  TECHNIQUE: Multidetector CT imaging of the chest was performed using the standard protocol during bolus administration of intravenous contrast. Multiplanar CT image reconstructions and MIPs were obtained to evaluate the vascular anatomy.  CONTRAST:  145mL OMNIPAQUE IOHEXOL 350 MG/ML SOLN  COMPARISON:  DG CHEST 2 VIEW dated 10/14/2013; CT ABD/PELV WO CM dated 03/07/2010  FINDINGS: Bilateral pulmonary artery artery filling defects, consistent with acute pulmonary emboli, including left main pulmonary artery propagating into the left upper lobe with nonocclusive embolus, occlusive left lower lobe segmental pulmonary embolism. Nonocclusive right upper lobe and right middle lobe acute lobar to segmental pulmonary emboli. Heart size is mildly enlarged with right heart strain (RV 35 mm, LV 28 mm). Pericardium is unremarkable.  Thoracic aorta is normal in course and caliber, unremarkable. Tracheobronchial tree is patent and midline, no pneumothorax. Left lower lobe and lingular atelectasis with small left pleural effusion.  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 Small hiatal hernia. Mild degenerative change of thoracic spine.  Review of the MIP images confirms the above findings.  IMPRESSION: Bilateral acute pulmonary emboli, including left pulmonary artery extending into the lobar branches, with occlusive left lower lobe segmental filling defect.  Positive for acute PE with CT evidence of right heart strain (RV/LV Ratio = 35/28) consistent with at least submassive (intermediate risk) PE. The presence of  right heart strain has been associated with an increased risk of morbidity and mortality.  Small left pleural effusion and atelectasis without frank infarction.  Findings discussed with and reconfirmed by Dr.Augustine Leverette on5/14/2015at9:08 PM.   Electronically Signed   By: Elon Alas   On: 10/14/2013 21:11   EKG Interpretation   Date/Time:  Thursday Oct 14 2013 19:01:00 EDT Ventricular Rate:  82 PR Interval:  136 QRS Duration: 80 QT Interval:  346 QTC Calculation: 404 R Axis:   40 Text Interpretation:  Normal sinus rhythm Nonspecific ST and T wave  abnormality Compared to previous tracing Non-specific ST-t changes NOW  PRESENT Confirmed by Stevie Kern  MD, Jenny Reichmann ([id number]) on 10/14/2013 7:38:07 PM      MDM   Final diagnoses:  Pulmonary embolism    The patient appears reasonably stabilized for admission considering the current resources, flow, and capabilities available in the ED at this time, and I doubt any other [facility] requiring further screening and/or treatment in the ED prior to admission.    Babette Relic, MD 10/17/13 [phone]

## 2013-10-14 NOTE — Progress Notes (Signed)
Unit CM UR Completed by [facility] ED CM  W. Husam Hohn RN  

## 2013-10-14 NOTE — H&P (Signed)
Patient's PCP: Joycelyn Man, MD  Chief Complaint: Chest pain and shortness of breath  History of Present Illness: Felicia Cruz is a 73 y.o. Caucasian female with history of restless leg syndrome, GERD, hyperlipidemia, asthma, status post right gluteal tendon repair on 12/28/2012 with subsequent ongoing pain, immobility, and weakness who went to [facility] and had a right hip open tendon neck to me abductors/flexor, tensor fascial glut maximus muscle tendon transfer as abductor reconstruction on 10/06/2013 by Dr. Jeannine Kitten who presents with the above complaints.  Since last year patient reports that she has been immobile and continued to be immobile after surgery on 10/06/2013 with only minimal weightbearing on her right leg and has been ambulating using a walker.  Last night she noted having left-sided pleuritic chest pain which was persistent all night.  She was also short of breath.  She tried albuterol inhaler with only minimal improvement.  Given ongoing symptoms, she presented to the emergency department for further evaluation.  CT of the chest with contrast to bilateral pulmonary emboli with right heart strain consistent with at least submassive pulmonary embolism.  Hospitalist service was asked to admit the patient for further care and management.  Patient denies any recent fevers, chills, nausea, vomiting, abdominal pain, diarrhea, headaches or vision changes.  Currently denies any chest pain.  Review of Systems: All systems reviewed with the patient and positive as per history of present illness, otherwise all other systems are negative.  Past Medical History  Diagnosis Date  . RLS (restless legs syndrome)   . Esophageal stricture 2010  . Osteopenia   . Hyperlipemia   . Arthritis   . Kidney stones   . GERD (gastroesophageal reflux disease)   . Substance abuse     RECOVERING ALCOLHOLIC  . Intrinsic asthma, unspecified     9/09 positive Methacholine challege test, normal  spirometry at baseline  . Asthma     PT STATES SHE WAS TREATED FOR 2 YRS FOR ASTHMA--BUT NOW HER SX'S HAVE BEEN ATTRIBUTED TO ACID REFLUX / HIATAL HERNIA  . Alcoholism     HAS NOT HAD Hydro  . Cataract   . 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 Lumbar disc surgery  2008  . Facial cosmetic surgery      eyes, nose  . Abdominoplasty    . Liposuction  15 to 20 yrs ago  . Surgery for kidney stones    . Right index finger surgery  06/19/11    AT [facility]  . Laparoscopic nissen fundoplication  1/95/0932    Procedure: LAPAROSCOPIC NISSEN FUNDOPLICATION;  Surgeon: Pedro Earls, MD;  Location: [facility]S;  Service: General;  Laterality: N/A;  Laparoscopic Nissen repair of hiatal hernia  with lighted bougie  . Esophageal manometry  02/17/2012    Procedure: ESOPHAGEAL MANOMETRY (EM);  Surgeon: Pedro Earls, MD;  Location: [facility] ENDOSCOPY;  Service: Endoscopy;  Laterality: N/A;  . Dilation and curettage of uterus  many yrs ago    x 2  . 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 Joint replacement      left  . Open surgical repair of gluteal tendon Right 12/28/2012    Procedure: RIGHT GLUTEAL TENDON REPAIR;  Surgeon: Mauri Pole, MD;  Location: [facility];  Service:  Orthopedics;  Laterality: Right;   Family History  Problem Relation Age of Onset  . Throat cancer Father     Smoker  . Cancer Father     throat  . Heart disease Mother   . Hypertension Mother   . Hip fracture Mother   . Alzheimer's disease Mother   . Stroke Mother   . Stroke Brother 33    smoker   History   Social History  . Marital Status: Widowed    Spouse Name: N/A    Number of Children: 0  . Years of Education: N/A   Occupational History  . Retired    Social History Main Topics  . Smoking  status: Former Smoker -- 1.00 packs/day for 10 years    Types: Cigarettes    Quit date: 06/03/1970  . Smokeless tobacco: Never Used     Comment: Passive tobacco smoke exposure history  . Alcohol Use: No     Comment: RECOVERING ALCOLHOLIC  . Drug Use: No  . Sexual Activity: Not on file   Other Topics Concern  . Not on file   Social History Narrative   Regular exercise-YES   Allergies: Penicillins; Latex; and Lipitor  Home Meds: Prior to Admission medications   Medication Sig Start Date End Date Taking? Authorizing Provider  acetaminophen (TYLENOL) 325 MG tablet Take 650 mg by mouth every 6 (six) hours as needed.   Yes Historical Provider, MD  albuterol (VENTOLIN HFA) 108 (90 BASE) MCG/ACT inhaler Inhale 2 puffs into the lungs every 6 (six) hours as needed for wheezing or shortness of breath. 07/27/13  Yes Colon Branch, MD  aspirin 325 MG tablet Take 325 mg by mouth 2 (two) times daily.   Yes Historical Provider, MD  bisacodyl (DULCOLAX) 5 MG EC tablet Take 10 mg by mouth daily as needed for moderate constipation.   Yes Historical Provider, MD  calcium carbonate (TUMS - DOSED IN MG ELEMENTAL CALCIUM) 500 MG chewable tablet Chew 1 tablet by mouth daily.   Yes Historical Provider, MD  LORazepam (ATIVAN) 1 MG tablet TAKE 1 TABLET BY MOUTH EVERY EVENING 09/09/13  Yes Dorena Cookey, MD  LORazepam (ATIVAN) 1 MG tablet Take 1 mg by mouth every 8 (eight) hours.   Yes Historical Provider, MD  omeprazole (PRILOSEC) 20 MG capsule Take 20 mg by mouth daily.   Yes Historical Provider, MD  ondansetron (ZOFRAN) 4 MG tablet Take 4 mg by mouth every 8 (eight) hours as needed for nausea or vomiting.   Yes Historical Provider, MD  OxyCODONE (OXYCONTIN) 10 mg T12A 12 hr tablet Take 10 mg by mouth every 12 (twelve) hours.   Yes Historical Provider, MD  pramipexole (MIRAPEX) 1 MG tablet Take 1 mg by mouth at bedtime. 1 by mouth each bedtime 09/09/13  Yes Dorena Cookey, MD  pregabalin (LYRICA) 150 MG capsule Take  150 mg by mouth 3 (three) times daily.   Yes Historical Provider, MD  sennosides-docusate sodium (SENOKOT-S) 8.6-50 MG tablet Take 2 tablets by mouth 2 (two) times daily.   Yes Historical Provider, MD    Physical Exam: Blood pressure 127/47, pulse 74, temperature 99.3 F (37.4 C), temperature source Oral, resp. rate 19, height 5\' 6"  (1.676 m), weight 83.008 kg (183 lb), SpO2 95.00%. General: Awake, Oriented x3, No acute distress. HEENT: EOMI, Moist mucous membranes Neck: Supple CV: S1 and S2 Lungs: Clear to ascultation bilaterally Abdomen: Soft, Nontender, Nondistended, +bowel sounds. Ext: Good pulses. Trace edema. No clubbing or cyanosis  noted.  Right hip covered in bandages, no blood or signs of infection. Neuro: Cranial Nerves II-XII grossly intact. Has 5/5 motor strength in upper and lower extremities.  Lab results:  Recent Labs  10/14/13 1920  NA 139  K 3.9  CL 101  CO2 22  GLUCOSE 114*  BUN 11  CREATININE 0.66  CALCIUM 9.1   No results found for this basename: AST, ALT, ALKPHOS, BILITOT, PROT, ALBUMIN,  in the last 72 hours No results found for this basename: LIPASE, AMYLASE,  in the last 72 hours  Recent Labs  10/14/13 1920  WBC 9.3  HGB 10.1*  HCT 30.4*  MCV 86.1  PLT 294   No results found for this basename: CKTOTAL, CKMB, CKMBINDEX, TROPONINI,  in the last 72 hours No components found with this basename: POCBNP,  No results found for this basename: DDIMER,  in the last 72 hours No results found for this basename: HGBA1C,  in the last 72 hours No results found for this basename: CHOL, HDL, LDLCALC, TRIG, CHOLHDL, LDLDIRECT,  in the last 72 hours No results found for this basename: TSH, T4TOTAL, FREET3, T3FREE, THYROIDAB,  in the last 72 hours No results found for this basename: VITAMINB12, FOLATE, FERRITIN, TIBC, IRON, RETICCTPCT,  in the last 72 hours Imaging results:  Dg Chest 2 View  10/14/2013   CLINICAL DATA:  Shortness of breath.  EXAM: CHEST  2 VIEW   COMPARISON:  Two-view chest x-ray 12/25/2010, 01/04/2008.  FINDINGS: Suboptimal inspiration accounts for crowded bronchovascular markings, especially in the bases, and accentuates the cardiac silhouette. Taking this into account, cardiac silhouette upper normal in size. Thoracic aorta mildly tortuous, unchanged. Hilar and mediastinal contours otherwise unremarkable. Mild atelectasis in the left lower lobe. Lungs otherwise clear. No localized airspace consolidation. No pleural effusions. No pneumothorax. Normal pulmonary vascularity. Degenerative changes involving the thoracic spine.  IMPRESSION: Suboptimal inspiration accounts for mild left lower lobe atelectasis. No acute cardiopulmonary disease otherwise.   Electronically Signed   By: Evangeline Dakin M.D.   On: 10/14/2013 20:14   Ct Angio Chest Pe W/cm &/or Wo Cm  10/14/2013   CLINICAL DATA:  Recent hip surgery, acute onset severe shortness of breath not relieved with inhaler use, left-sided body pain.  EXAM: CT ANGIOGRAPHY CHEST WITH CONTRAST  TECHNIQUE: Multidetector CT imaging of the chest was performed using the standard protocol during bolus administration of intravenous contrast. Multiplanar CT image reconstructions and MIPs were obtained to evaluate the vascular anatomy.  CONTRAST:  161mL OMNIPAQUE IOHEXOL 350 MG/ML SOLN  COMPARISON:  DG CHEST 2 VIEW dated 10/14/2013; CT ABD/PELV WO CM dated 03/07/2010  FINDINGS: Bilateral pulmonary artery artery filling defects, consistent with acute pulmonary emboli, including left main pulmonary artery propagating into the left upper lobe with nonocclusive embolus, occlusive left lower lobe segmental pulmonary embolism. Nonocclusive right upper lobe and right middle lobe acute lobar to segmental pulmonary emboli. Heart size is mildly enlarged with right heart strain (RV 35 mm, LV 28 mm). Pericardium is unremarkable.  Thoracic aorta is normal in course and caliber, unremarkable. Tracheobronchial tree is patent and  midline, no pneumothorax. Left lower lobe and lingular atelectasis with small left pleural effusion.  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 Small hiatal hernia. Mild degenerative change of thoracic spine.  Review of the MIP images confirms the above findings.  IMPRESSION: Bilateral acute pulmonary emboli,  including left pulmonary artery extending into the lobar branches, with occlusive left lower lobe segmental filling defect.  Positive for acute PE with CT evidence of right heart strain (RV/LV Ratio = 35/28) consistent with at least submassive (intermediate risk) PE. The presence of right heart strain has been associated with an increased risk of morbidity and mortality.  Small left pleural effusion and atelectasis without frank infarction.  Findings discussed with and reconfirmed by Dr.JOHN BEDNAR on5/14/2015at9:08 PM.   Electronically Signed   By: Elon Alas   On: 10/14/2013 21:11   Other results: EKG: Normal sinus rhythm with heart rate of 82.  Assessment & Plan by Problem: Acute bilateral pulmonary embolism with CT evidence of right heart strain consistent with at least submassive PE Likely due to immobility from recent surgery.  Started on heparin.  Will get a 2-D echocardiogram in the morning for further evaluation. Dr. Stevie Kern discussed with Dr. Lamonte Sakai, critical care, did not think patient needed a tPA at this time as patient was hemodynamically stable and was not hypoxic.  Change heparin to another anticoagulant depending on patient's clinical course.  If any signs of hemodynamic instability or significant hypoxia may need tPA.  Acute respiratory failure and chest pain Due to above.  Recent right hip surgery Stable.  Request wound care consult in the morning to change bandages.  Anemia May be related to acute blood loss from recent surgery.   Send anemia panel.  Depression/anxiety Continue home medications.  Restless leg syndrome Continue home medications.  GERD Continue PPI.  Prophylaxis Lovenox.  CODE STATUS Full code.  Disposition Admit the patient as the patient to telemetry.  Time spent on admission, talking to the patient, and coordinating care was: 60 mins.  Bynum Bellows, MD 10/14/2013, 10:16 PM

## 2013-10-14 NOTE — ED Notes (Signed)
She had a hip surgery at [facility] on Wednesday. She woke last night with severe sob and her inhaler would not relieve the sob. She reports the sob has persisted today and shes hurting in the whole L side of her body. Her doctor advised her to come to the ED for evaluation

## 2013-10-14 NOTE — Progress Notes (Signed)
ANTICOAGULATION CONSULT NOTE - Initial Consult  Pharmacy Consult for Heparin Indication: pulmonary embolus  Allergies  Allergen Reactions  . Penicillins Hives, Shortness Of Breath and Itching  . Latex Other (See Comments)    Skin peeling (thumb)  . Lipitor [Atorvastatin]     Patient Measurements: Height: 5\' 6"  (167.6 cm) Weight: 183 lb (83.008 kg) IBW/kg (Calculated) : 59.3  Vital Signs: Temp: 99.3 F (37.4 C) (05/14 1900) Temp src: Oral (05/14 1900) BP: 134/65 mmHg (05/14 2107) Pulse Rate: 75 (05/14 2107)  Labs:  Recent Labs  10/14/13 1920  HGB 10.1*  HCT 30.4*  PLT 294  LABPROT 14.1  INR 1.11  CREATININE 0.66    Estimated Creatinine Clearance: 69 ml/min (by C-G formula based on Cr of 0.66).   Medical History: Past Medical History  Diagnosis Date  . RLS (restless legs syndrome)   . Esophageal stricture 2010  . Osteopenia   . Hyperlipemia   . Arthritis   . Kidney stones   . GERD (gastroesophageal reflux disease)   . Substance abuse     RECOVERING ALCOLHOLIC  . Intrinsic asthma, unspecified     9/09 positive Methacholine challege test, normal spirometry at baseline  . Asthma     PT STATES SHE WAS TREATED FOR 2 YRS FOR ASTHMA--BUT NOW HER SX'S HAVE BEEN ATTRIBUTED TO ACID REFLUX / HIATAL HERNIA  . Alcoholism     HAS NOT HAD [place]  . Cataract   . 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  Pharmacy consulted to start heparin.  Coag: PE, patient denies bleeding.    Goal of Therapy:  Heparin level 0.3-0.7 units/ml Monitor platelets by anticoagulation protocol: Yes   Plan:  1. Heparin 5000 units IV x 1 2. Heparin 1500 units/h 3. Check heparin level in 6h 4. Daily heparin level and cbc 5. Follow up for plan for oral anticoagulant.  Thank you for allowing pharmacy  to be a part of this patients care team.  Rowe Robert Pharm.D., BCPS, AQ-Cardiology Clinical Pharmacist 10/14/2013 9:54 PM Pager: [phone] Phone: [phone]

## 2013-10-15 DIAGNOSIS — J9600 Acute respiratory failure, unspecified whether with hypoxia or hypercapnia: Secondary | ICD-10-CM

## 2013-10-15 DIAGNOSIS — I369 Nonrheumatic tricuspid valve disorder, unspecified: Secondary | ICD-10-CM

## 2013-10-15 DIAGNOSIS — I2699 Other pulmonary embolism without acute cor pulmonale: Secondary | ICD-10-CM

## 2013-10-15 DIAGNOSIS — D62 Acute posthemorrhagic anemia: Secondary | ICD-10-CM

## 2013-10-15 LAB — CBC
HCT: 28.7 % — ABNORMAL LOW (ref 36.0–46.0)
Hemoglobin: 9.3 g/dL — ABNORMAL LOW (ref 12.0–15.0)
MCH: 28.1 pg (ref 26.0–34.0)
MCHC: 32.4 g/dL (ref 30.0–36.0)
MCV: 86.7 fL (ref 78.0–100.0)
Platelets: 320 10*3/uL (ref 150–400)
RBC: 3.31 MIL/uL — ABNORMAL LOW (ref 3.87–5.11)
RDW: 15.7 % — ABNORMAL HIGH (ref 11.5–15.5)
WBC: 8.5 10*3/uL (ref 4.0–10.5)

## 2013-10-15 LAB — FERRITIN: FERRITIN: 65 ng/mL (ref 10–291)

## 2013-10-15 LAB — BASIC METABOLIC PANEL
BUN: 11 mg/dL (ref 6–23)
CO2: 21 mEq/L (ref 19–32)
CREATININE: 0.63 mg/dL (ref 0.50–1.10)
Calcium: 8.8 mg/dL (ref 8.4–10.5)
Chloride: 100 mEq/L (ref 96–112)
GFR calc Af Amer: >90 mL/min (ref >90)
GFR, EST NON AFRICAN AMERICAN: 87 mL/min — AB (ref >90)
Glucose, Bld: 128 mg/dL — ABNORMAL HIGH (ref 70–99)
Potassium: 3.5 mEq/L — ABNORMAL LOW (ref 3.7–5.3)
SODIUM: 134 meq/L — AB (ref 137–147)

## 2013-10-15 LAB — RETICULOCYTES
RBC.: 3.31 MIL/uL — ABNORMAL LOW (ref 3.87–5.11)
RETIC COUNT ABSOLUTE: 59.6 10*3/uL (ref 19.0–186.0)
RETIC CT PCT: 1.8 % (ref 0.4–3.1)

## 2013-10-15 LAB — VITAMIN B12: Vitamin B-12: 275 pg/mL (ref 211–911)

## 2013-10-15 LAB — IRON AND TIBC
Iron: 19 ug/dL — ABNORMAL LOW (ref 42–135)
Saturation Ratios: 7 % — ABNORMAL LOW (ref 20–55)
TIBC: 283 ug/dL (ref 250–470)
UIBC: 264 ug/dL (ref 125–400)

## 2013-10-15 LAB — HEPARIN LEVEL (UNFRACTIONATED)
Heparin Unfractionated: 0.56 IU/mL (ref 0.30–0.70)
Heparin Unfractionated: 0.35 IU/mL (ref 0.30–0.70)

## 2013-10-15 LAB — FOLATE: Folate: >20 ng/mL

## 2013-10-15 MED ORDER — SODIUM CHLORIDE 0.9 % IV SOLN
INTRAVENOUS | Status: DC
  Administered 2013-10-15 – 2013-10-16 (×3): via INTRAVENOUS

## 2013-10-15 MED ORDER — POTASSIUM CHLORIDE CRYS ER 20 MEQ PO TBCR
40.0000 meq | EXTENDED_RELEASE_TABLET | Freq: Once | ORAL | Status: AC
  Administered 2013-10-15: 40 meq via ORAL
  Filled 2013-10-15: qty 2

## 2013-10-15 MED ORDER — PATIENT'S GUIDE TO USING COUMADIN BOOK
Freq: Once | Status: AC
  Administered 2013-10-15: 18:00:00
  Filled 2013-10-15: qty 1

## 2013-10-15 MED ORDER — WARFARIN SODIUM 5 MG PO TABS
5.0000 mg | ORAL_TABLET | Freq: Once | ORAL | Status: AC
  Administered 2013-10-15: 5 mg via ORAL
  Filled 2013-10-15: qty 1

## 2013-10-15 MED ORDER — POTASSIUM CHLORIDE 20 MEQ PO PACK: 40.0000 meq | PACK | Freq: Once | ORAL | Status: DC

## 2013-10-15 MED ORDER — WARFARIN - PHARMACIST DOSING INPATIENT
Freq: Every day | Status: DC
  Administered 2013-10-15: 18:00:00

## 2013-10-15 MED ORDER — WARFARIN VIDEO
Freq: Once | Status: AC
  Administered 2013-10-15: 18:00:00

## 2013-10-15 NOTE — Consult Note (Signed)
WOC wound consult note Reason for Consult: evaluation of the right hip wound. Pt with recent orthopedic surgery at [facility] 10/05/13. She is in a brace and has a dressing intact to the right hip. She reports she has been instructed to change this dressing every other day.  Using Opsite visible dressing previously.  WOC has provided 3 dressing and applied one today as the dressing was due to be changed today. Wound type: surgical with staples intact Wound bed: no open wound Drainage (amount, consistency, odor) none Periwound: intact, no erythema  Dressing procedure/placement/frequency: Dressing placed today and to be changed every other day.    Discussed POC with patient and bedside nurse.  Re consult if needed, will not follow at this time. Thanks  Keimya Briddell [place], [place] [phone])

## 2013-10-15 NOTE — Progress Notes (Signed)
*  Preliminary Results* Bilateral lower extremity venous duplex completed. Bilateral lower extremities are negative for deep vein thrombosis. There is no evidence of Baker's cyst bilaterally.  10/15/2013  Maudry Mayhew, RVT, RDCS, RDMS

## 2013-10-15 NOTE — Progress Notes (Signed)
Echocardiogram 2D Echocardiogram has been performed.  Alyson Locket Ivanna Kocak 10/15/2013, 3:10 PM

## 2013-10-15 NOTE — Progress Notes (Signed)
[place] for Heparin Indication: pulmonary embolus  Allergies  Allergen Reactions  . Penicillins Hives, Shortness Of Breath and Itching  . Latex Other (See Comments)    Skin peeling (thumb)  . Lipitor [Atorvastatin]     Patient Measurements: Height: 5\' 6"  (167.6 cm) Weight: 195 lb 1.7 oz (88.5 kg) IBW/kg (Calculated) : 59.3  Vital Signs: Temp: 99.6 F (37.6 C) (05/15 0453) Temp src: Oral (05/15 0453) BP: 116/39 mmHg (05/15 0453) Pulse Rate: 76 (05/15 0453)  Labs:  Recent Labs  10/14/13 1920 10/15/13 0405  HGB 10.1* 9.3*  HCT 30.4* 28.7*  PLT 294 320  LABPROT 14.1  --   INR 1.11  --   HEPARINUNFRC  --  0.56  CREATININE 0.66  --     Estimated Creatinine Clearance: 71.2 ml/min (by C-G formula based on Cr of 0.66).  Assessment: 73 yo female with PE for heparin  Goal of Therapy:  Heparin level 0.3-0.7 units/ml Monitor platelets by anticoagulation protocol: Yes   Plan:  Continue Heparin at current rate Recheck level in 6 hrs to verify  Phillis Knack, PharmD, BCPS

## 2013-10-15 NOTE — Progress Notes (Signed)
[facility] PROGRESS NOTE  Felicia Cruz CXK:[id number] DOB: 1940-12-16 DOA: 10/14/2013 PCP: Joycelyn Man, MD  Assessment/Plan: 1. Pulmonary Embolism -clinically improving this morning -will plan on starting warfarin after discussion with pharmacy -will get a Lower extremity doppler to assess for DVT  2. S/P Right Recent Right Hip Surgery -currently pain free -wound care to see regarding surgical wound  3. Anemia -likely related to recent surgery -iron studies ordered are pending will follow up  4. RLS -continue home medications  5. GERD -Stable at this time -continue PPI  6. Hypokalemia -will give potassium supplement -repeat labs in am  7. Hyponatremia -started on IV normal saline -repeat labs in am   Code Status: Full Code Family Communication: No family present (indicate person spoken with, relationship, and if by phone, the number) Disposition Plan: Home   Consultants:  None  Procedures:  None  Antibiotics:  None  HPI/Subjective: 16 yow female with history of recent right hip surgery performed at [name] on Oct 06, 2013 presented to the ED with increased shortness of breath and chest pain. She was found to have bilateral Pulmonary Embolism with evidence of right heart strain. Critical Care service felt no need for tPA as she was hemodynamically stable. This morning she is doing better. Her pain appears to be improving and her breathing is also improving.  Objective: Filed Vitals:   10/15/13 0453  BP: 116/39  Pulse: 76  Temp: 99.6 F (37.6 C)  Resp: 18   No intake or output data in the 24 hours ending 10/15/13 0826 Filed Weights   10/14/13 1900 10/14/13 2300 10/15/13 0500  Weight: 83.008 kg (183 lb) 88.5 kg (195 lb 1.7 oz) 88.5 kg (195 lb 1.7 oz)    Exam:   General:  Awake and alert comfortable no distress  Cardiovascular: RRR no gallop or rub noted S1 S2 is normal  Respiratory: good air entry no ronchi  noted  Abdomen: soft non-tender no rebound  Musculoskeletal: No joint pain noted grossly  Data Reviewed: Basic Metabolic Panel:  Recent Labs Lab 10/14/13 1920 10/15/13 0405  NA 139 134*  K 3.9 3.5*  CL 101 100  CO2 22 21  GLUCOSE 114* 128*  BUN 11 11  CREATININE 0.66 0.63  CALCIUM 9.1 8.8   Liver Function Tests: No results found for this basename: AST, ALT, ALKPHOS, BILITOT, PROT, ALBUMIN,  in the last 168 hours No results found for this basename: LIPASE, AMYLASE,  in the last 168 hours No results found for this basename: AMMONIA,  in the last 168 hours CBC:  Recent Labs Lab 10/14/13 1920 10/15/13 0405  WBC 9.3 8.5  HGB 10.1* 9.3*  HCT 30.4* 28.7*  MCV 86.1 86.7  PLT 294 320   Cardiac Enzymes: No results found for this basename: CKTOTAL, CKMB, CKMBINDEX, TROPONINI,  in the last 168 hours BNP (last 3 results) No results found for this basename: PROBNP,  in the last 8760 hours CBG: No results found for this basename: GLUCAP,  in the last 168 hours  No results found for this or any previous visit (from the past 240 hour(s)).   Studies: Dg Chest 2 View  10/14/2013   CLINICAL DATA:  Shortness of breath.  EXAM: CHEST  2 VIEW  COMPARISON:  Two-view chest x-ray 12/25/2010, 01/04/2008.  FINDINGS: Suboptimal inspiration accounts for crowded bronchovascular markings, especially in the bases, and accentuates the cardiac silhouette. Taking this into account, cardiac silhouette upper normal in size. Thoracic aorta mildly tortuous, unchanged.  Hilar and mediastinal contours otherwise unremarkable. Mild atelectasis in the left lower lobe. Lungs otherwise clear. No localized airspace consolidation. No pleural effusions. No pneumothorax. Normal pulmonary vascularity. Degenerative changes involving the thoracic spine.  IMPRESSION: Suboptimal inspiration accounts for mild left lower lobe atelectasis. No acute cardiopulmonary disease otherwise.   Electronically Signed   By: Felicia Cruz M.D.   On: 10/14/2013 20:14   Ct Angio Chest Pe W/cm &/or Wo Cm  10/14/2013   CLINICAL DATA:  Recent hip surgery, acute onset severe shortness of breath not relieved with inhaler use, left-sided body pain.  EXAM: CT ANGIOGRAPHY CHEST WITH CONTRAST  TECHNIQUE: Multidetector CT imaging of the chest was performed using the standard protocol during bolus administration of intravenous contrast. Multiplanar CT image reconstructions and MIPs were obtained to evaluate the vascular anatomy.  CONTRAST:  117mL OMNIPAQUE IOHEXOL 350 MG/ML SOLN  COMPARISON:  DG CHEST 2 VIEW dated 10/14/2013; CT ABD/PELV WO CM dated 03/07/2010  FINDINGS: Bilateral pulmonary artery artery filling defects, consistent with acute pulmonary emboli, including left main pulmonary artery propagating into the left upper lobe with nonocclusive embolus, occlusive left lower lobe segmental pulmonary embolism. Nonocclusive right upper lobe and right middle lobe acute lobar to segmental pulmonary emboli. Heart size is mildly enlarged with right heart strain (RV 35 mm, LV 28 mm). Pericardium is unremarkable.  Thoracic aorta is normal in course and caliber, unremarkable. Tracheobronchial tree is patent and midline, no pneumothorax. Left lower lobe and lingular atelectasis with small left pleural effusion.  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 Small hiatal hernia. Mild degenerative change of thoracic spine.  Review of the MIP images confirms the above findings.  IMPRESSION: Bilateral acute pulmonary emboli, including left pulmonary artery extending into the lobar branches, with occlusive left lower lobe segmental filling defect.  Positive for acute PE with CT evidence of right heart strain (RV/LV Ratio = 35/28) consistent with at least submassive (intermediate risk) PE. The presence of right heart strain  has been associated with an increased risk of morbidity and mortality.  Small left pleural effusion and atelectasis without frank infarction.  Findings discussed with and reconfirmed by Dr.JOHN BEDNAR on5/14/2015at9:08 PM.   Electronically Signed   By: Elon Alas   On: 10/14/2013 21:11    Scheduled Meds: . calcium carbonate  1 tablet Oral Daily  . LORazepam  1 mg Oral 3 times per day  . OxyCODONE  10 mg Oral Q12H  . pantoprazole  40 mg Oral Daily  . pramipexole  1 mg Oral QHS  . pregabalin  150 mg Oral TID  . senna-docusate  2 tablet Oral BID  . sodium chloride  3 mL Intravenous Q12H   Continuous Infusions: . heparin Stopped (10/14/13 2234)    Principal Problem:   PE (pulmonary embolism) Active Problems:   HYPERLIPIDEMIA   Depression   Overweight (BMI 25.0-29.9)   Generalized anxiety disorder   GERD (gastroesophageal reflux disease)   Pulmonary embolism   Acute respiratory failure   Chest pain   Anemia    Time spent: [facility] Pager [phone] If 7PM-7AM, please contact night-coverage at [email or web address], password [facility] 10/15/2013, 8:26 AM  LOS: 1 day

## 2013-10-15 NOTE — Evaluation (Signed)
Physical Therapy Evaluation Patient Details Name: Felicia Cruz MRN: [id number] DOB: 07-02-1940 Today's Date: 10/15/2013   History of Present Illness    Felicia Cruz is a 73 y.o. Caucasian female with history of restless leg syndrome, GERD, hyperlipidemia, asthma, status post right gluteal tendon repair on 12/28/2012 with subsequent ongoing pain, immobility, and weakness who went to [facility] and had a right hip open tendon neck to me abductors/flexor, tensor fascial glut maximus muscle tendon transfer as abductor reconstruction on 10/06/2013 by Dr. Alvan Dame who presents with the above complaints. Since last year patient reports that she has been immobile and continued to be immobile after surgery on 10/06/2013 with only minimal weightbearing on her right leg and has been ambulating using a walker. Last night she noted having left-sided pleuritic chest pain which was persistent all night. She was also short of breath. She tried albuterol inhaler with only minimal improvement. Given ongoing symptoms, she presented to the emergency department for further evaluation. CT of the chest with contrast to bilateral pulmonary emboli with right heart strain consistent with at least submassive pulmonary embolism.    Clinical Impression  Pt admitted with above. Pt currently with functional limitations due to the deficits listed below (see PT Problem List).  Pt will benefit from skilled PT to increase their independence and safety with mobility to allow discharge to the venue listed below.     Follow Up Recommendations No PT follow up    Equipment Recommendations  None recommended by PT    Recommendations for Other Services       Precautions / Restrictions Precautions Precautions: Fall Required Braces or Orthoses: Other Brace/Splint Other Brace/Splint: Hip brace right hip Restrictions Weight Bearing Restrictions: Yes RLE Weight Bearing: Partial weight bearing RLE Partial Weight Bearing Percentage or  Pounds: 30%      Mobility  Bed Mobility Overal bed mobility: Needs Assistance Bed Mobility: Supine to Sit     Supine to sit: Min guard     General bed mobility comments: Needed guard assist ffor safety.  Transfers Overall transfer level: Needs assistance Equipment used: Rolling walker (2 wheeled) Transfers: Sit to/from Stand Sit to Stand: Min guard         General transfer comment: Pt somewhat impulsive with movements.  Needs guard assist for safety.  Cues for 30% weight bearing for sit to stand as well as cues for technique with sequencing steps and RW.  Also needed cues for hand placement for sit to stand.    Ambulation/Gait Ambulation/Gait assistance: Min guard Ambulation Distance (Feet): 25 Feet Assistive device: Rolling walker (2 wheeled) Gait Pattern/deviations: Step-to pattern;Decreased step length - right;Decreased step length - left;Decreased stride length;Antalgic;Wide base of support   Gait velocity interpretation: at or above normal speed for age/gender General Gait Details: Pt slightly impulsive with RW.  Needed cues to sequence steps and RW.  No LOB and overall seemed to be maintaining PWB.    Stairs            Wheelchair Mobility    Modified Rankin (Stroke Patients Only)       Balance Overall balance assessment: Needs assistance;History of Falls         Standing balance support: Bilateral upper extremity supported;During functional activity Standing balance-Leahy Scale: Fair Standing balance comment: Pt washed hands at sink and can balance without UE support but cannot withstand challenges to balance.  Pertinent Vitals/Pain VSS with sats on RA 96%, No pain    Home Living Family/patient expects to be discharged to:: Private residence Living Arrangements: Alone Available Help at Discharge: Friend(s);Available 24 hours/day Type of Home: House Home Access: Level entry     Home Layout: One  level Home Equipment: Walker - 4 wheels;Walker - 2 wheels;Cane - single point;Bedside commode;Wheelchair - manual;Shower seat - built in Additional Comments: Friend has been staying with her while pt recuperating from hip surgery.    Prior Function Level of Independence: Independent with assistive device(s)               Hand Dominance        Extremity/Trunk Assessment   Upper Extremity Assessment: Defer to OT evaluation           Lower Extremity Assessment: RLE deficits/detail;LLE deficits/detail RLE Deficits / Details: Not formally tested due to brace in place.  WFL. LLE Deficits / Details: grossly 4/5     Communication   Communication: No difficulties  Cognition Arousal/Alertness: Awake/alert Behavior During Therapy: WFL for tasks assessed/performed Overall Cognitive Status: Within Functional Limits for tasks assessed                      General Comments General comments (skin integrity, edema, etc.): Red area noted above brace on back and nursing notified.     Exercises        Assessment/Plan    PT Assessment Patient needs continued PT services  PT Diagnosis Generalized weakness   PT Problem List Decreased activity tolerance;Decreased balance;Decreased mobility;Decreased knowledge of use of DME;Decreased safety awareness;Decreased knowledge of precautions  PT Treatment Interventions DME instruction;Gait training;Functional mobility training;Therapeutic activities;Therapeutic exercise;Balance training;Patient/family education   PT Goals (Current goals can be found in the Care Plan section) Acute Rehab PT Goals Patient Stated Goal: to go home PT Goal Formulation: With patient Time For Goal Achievement: 10/22/13 Potential to Achieve Goals: Good    Frequency Min 3X/week   Barriers to discharge        Co-evaluation               End of Session Equipment Utilized During Treatment: Gait belt;Other (comment) (hip brace right  hip) Activity Tolerance: Patient limited by fatigue Patient left: in chair;with call bell/phone within reach;with family/visitor present Nurse Communication: Mobility status         Time: 1200-1229 PT Time Calculation (min): 29 min   Charges:   PT Evaluation $Initial PT Evaluation Tier I: 1 Procedure PT Treatments $Gait Training: 8-22 mins   PT G Codes:          Derris Millan In[place] 2013/10/31, 1:32 PM [facility] Acute Rehabilitation [phone] (pager)

## 2013-10-15 NOTE — Progress Notes (Signed)
[place] for Heparin Indication: pulmonary embolus  Allergies  Allergen Reactions  . Penicillins Hives, Shortness Of Breath and Itching  . Latex Other (See Comments)    Skin peeling (thumb)  . Lipitor [Atorvastatin]     Patient Measurements: Height: 5\' 6"  (167.6 cm) Weight: 195 lb 1.7 oz (88.5 kg) IBW/kg (Calculated) : 59.3  Vital Signs: Temp: 99.6 F (37.6 C) (05/15 0453) Temp src: Oral (05/15 0453) BP: 116/39 mmHg (05/15 0453) Pulse Rate: 76 (05/15 0453)  Labs:  Recent Labs  10/14/13 1920 10/15/13 0405  HGB 10.1* 9.3*  HCT 30.4* 28.7*  PLT 294 320  LABPROT 14.1  --   INR 1.11  --   HEPARINUNFRC  --  0.56  CREATININE 0.66 0.63    Estimated Creatinine Clearance: 71.2 ml/min (by C-G formula based on Cr of 0.63).  Assessment: 73 yo female with bilateral PE currently at goal on IV heparin at 1500 units/hr. No bleeding issues noted. Slight trend down in cbc will continue to follow.   Follow up heparin level is trending down, will increase dose to keep within range. D/w TRH and will start warfarin tonight. Baseline INR 1.1.  Goal of Therapy:  Heparin level 0.3-0.7 units/ml INR goal 2-3 Monitor platelets by anticoagulation protocol: Yes   Plan:  Warfarin 5mg  tonight - order education materials Daily HL/CBC/HL Follow up plan for oral anticoagulation Increase heparin to 1650/hr - check HL in am  Erin Hearing PharmD., BCPS Clinical Pharmacist Pager [phone] 10/15/2013 8:33 AM

## 2013-10-16 DIAGNOSIS — D62 Acute posthemorrhagic anemia: Secondary | ICD-10-CM

## 2013-10-16 DIAGNOSIS — I2699 Other pulmonary embolism without acute cor pulmonale: Secondary | ICD-10-CM

## 2013-10-16 DIAGNOSIS — J9600 Acute respiratory failure, unspecified whether with hypoxia or hypercapnia: Secondary | ICD-10-CM

## 2013-10-16 LAB — CBC
HCT: 27.4 % — ABNORMAL LOW (ref 36.0–46.0)
Hemoglobin: 8.9 g/dL — ABNORMAL LOW (ref 12.0–15.0)
MCH: 28.7 pg (ref 26.0–34.0)
MCHC: 32.5 g/dL (ref 30.0–36.0)
MCV: 88.4 fL (ref 78.0–100.0)
PLATELETS: 292 10*3/uL (ref 150–400)
RBC: 3.1 MIL/uL — ABNORMAL LOW (ref 3.87–5.11)
RDW: 15.6 % — ABNORMAL HIGH (ref 11.5–15.5)
WBC: 7.1 10*3/uL (ref 4.0–10.5)

## 2013-10-16 LAB — BASIC METABOLIC PANEL
BUN: 8 mg/dL (ref 6–23)
CO2: 22 mEq/L (ref 19–32)
CREATININE: 0.61 mg/dL (ref 0.50–1.10)
Calcium: 8.6 mg/dL (ref 8.4–10.5)
Chloride: 107 mEq/L (ref 96–112)
GFR calc non Af Amer: 88 mL/min — ABNORMAL LOW (ref >90)
Glucose, Bld: 99 mg/dL (ref 70–99)
Potassium: 4.1 mEq/L (ref 3.7–5.3)
Sodium: 140 mEq/L (ref 137–147)

## 2013-10-16 LAB — PROTIME-INR
INR: 1.13 (ref 0.00–1.49)
Prothrombin Time: 14.3 seconds (ref 11.6–15.2)

## 2013-10-16 LAB — HEPARIN LEVEL (UNFRACTIONATED): HEPARIN UNFRACTIONATED: 0.39 [IU]/mL (ref 0.30–0.70)

## 2013-10-16 MED ORDER — WARFARIN SODIUM 5 MG PO TABS
5.0000 mg | ORAL_TABLET | Freq: Once | ORAL | Status: AC
  Administered 2013-10-16: 5 mg via ORAL
  Filled 2013-10-16: qty 1

## 2013-10-16 MED ORDER — FERROUS SULFATE 325 (65 FE) MG PO TABS
325.0000 mg | ORAL_TABLET | Freq: Every day | ORAL | Status: DC
  Administered 2013-10-17 – 2013-10-20 (×4): 325 mg via ORAL
  Filled 2013-10-16 (×5): qty 1

## 2013-10-16 NOTE — Progress Notes (Signed)
[facility] PROGRESS NOTE  Felicia Cruz ZSW:[id number] DOB: 28-Apr-1941 DOA: 10/14/2013 PCP: Joycelyn Man, MD  Assessment/Plan: 1. Pulmonary Embolism -Doing well out of bed. No SOB noted -warfarin started INR this morning 1.13 -if LE doppler negative for DVT no need for filter  2. S/P Right Recent Right Hip Surgery -currently pain free -wound care notes appreciated  3. Anemia -likely related to recent surgery -iron studies will start on iron  4. RLS -continue home medications  5. GERD -Stable at this time -continue PPI  6. Hypokalemia -will give potassium supplement as needed -repeat labs improved  7. Hyponatremia -started on IV normal saline improved -repeat labs improved   Code Status: Full Code Family Communication: No family present (indicate person spoken with, relationship, and if by phone, the number) Disposition Plan: Home   Consultants:  None  Procedures:  None  Antibiotics:  None  HPI/Subjective: 87 yow female with history of recent right hip surgery performed at [facility] on Oct 06, 2013 presented to the ED with increased shortness of breath and chest pain. She was found to have bilateral Pulmonary Embolism with evidence of right heart strain. Critical Care service felt no need for tPA as she was hemodynamically stable. This morning she is doing better. Her pain appears to be improving and her breathing is also improving.  Objective: Filed Vitals:   10/16/13 0440  BP: 115/62  Pulse: 77  Temp: 99.1 F (37.3 C)  Resp: 17    Intake/Output Summary (Last 24 hours) at 10/16/13 1225 Last data filed at 10/16/13 0956  Gross per 24 hour  Intake 1115.5 ml  Output    600 ml  Net  515.5 ml   Filed Weights   10/14/13 2300 10/15/13 0500 10/16/13 0440  Weight: 88.5 kg (195 lb 1.7 oz) 88.5 kg (195 lb 1.7 oz) 89 kg (196 lb 3.4 oz)    Exam:   General:  Awake and alert comfortable no distress  Cardiovascular: RRR no gallop or  rub noted S1 S2 is normal  Respiratory: good air entry no ronchi noted  Abdomen: soft non-tender no rebound  Musculoskeletal: No joint pain noted grossly  Data Reviewed: Basic Metabolic Panel:  Recent Labs Lab 10/14/13 1920 10/15/13 0405 10/16/13 0320  NA 139 134* 140  K 3.9 3.5* 4.1  CL 101 100 107  CO2 22 21 22   GLUCOSE 114* 128* 99  BUN 11 11 8   CREATININE 0.66 0.63 0.61  CALCIUM 9.1 8.8 8.6   Liver Function Tests: No results found for this basename: AST, ALT, ALKPHOS, BILITOT, PROT, ALBUMIN,  in the last 168 hours No results found for this basename: LIPASE, AMYLASE,  in the last 168 hours No results found for this basename: AMMONIA,  in the last 168 hours CBC:  Recent Labs Lab 10/14/13 1920 10/15/13 0405 10/16/13 0320  WBC 9.3 8.5 7.1  HGB 10.1* 9.3* 8.9*  HCT 30.4* 28.7* 27.4*  MCV 86.1 86.7 88.4  PLT 294 320 292   Cardiac Enzymes: No results found for this basename: CKTOTAL, CKMB, CKMBINDEX, TROPONINI,  in the last 168 hours BNP (last 3 results) No results found for this basename: PROBNP,  in the last 8760 hours CBG: No results found for this basename: GLUCAP,  in the last 168 hours  No results found for this or any previous visit (from the past 240 hour(s)).   Studies: Dg Chest 2 View  10/14/2013   CLINICAL DATA:  Shortness of breath.  EXAM: CHEST  2  VIEW  COMPARISON:  Two-view chest x-ray 12/25/2010, 01/04/2008.  FINDINGS: Suboptimal inspiration accounts for crowded bronchovascular markings, especially in the bases, and accentuates the cardiac silhouette. Taking this into account, cardiac silhouette upper normal in size. Thoracic aorta mildly tortuous, unchanged. Hilar and mediastinal contours otherwise unremarkable. Mild atelectasis in the left lower lobe. Lungs otherwise clear. No localized airspace consolidation. No pleural effusions. No pneumothorax. Normal pulmonary vascularity. Degenerative changes involving the thoracic spine.  IMPRESSION:  Suboptimal inspiration accounts for mild left lower lobe atelectasis. No acute cardiopulmonary disease otherwise.   Electronically Signed   By: Evangeline Dakin M.D.   On: 10/14/2013 20:14   Ct Angio Chest Pe W/cm &/or Wo Cm  10/14/2013   CLINICAL DATA:  Recent hip surgery, acute onset severe shortness of breath not relieved with inhaler use, left-sided body pain.  EXAM: CT ANGIOGRAPHY CHEST WITH CONTRAST  TECHNIQUE: Multidetector CT imaging of the chest was performed using the standard protocol during bolus administration of intravenous contrast. Multiplanar CT image reconstructions and MIPs were obtained to evaluate the vascular anatomy.  CONTRAST:  133mL OMNIPAQUE IOHEXOL 350 MG/ML SOLN  COMPARISON:  DG CHEST 2 VIEW dated 10/14/2013; CT ABD/PELV WO CM dated 03/07/2010  FINDINGS: Bilateral pulmonary artery artery filling defects, consistent with acute pulmonary emboli, including left main pulmonary artery propagating into the left upper lobe with nonocclusive embolus, occlusive left lower lobe segmental pulmonary embolism. Nonocclusive right upper lobe and right middle lobe acute lobar to segmental pulmonary emboli. Heart size is mildly enlarged with right heart strain (RV 35 mm, LV 28 mm). Pericardium is unremarkable.  Thoracic aorta is normal in course and caliber, unremarkable. Tracheobronchial tree is patent and midline, no pneumothorax. Left lower lobe and lingular atelectasis with small left pleural effusion.  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 Small hiatal hernia. Mild degenerative change of thoracic spine.  Review of the MIP images confirms the above findings.  IMPRESSION: Bilateral acute pulmonary emboli, including left pulmonary artery extending into the lobar branches, with occlusive left lower lobe segmental filling defect.  Positive for acute PE with  CT evidence of right heart strain (RV/LV Ratio = 35/28) consistent with at least submassive (intermediate risk) PE. The presence of right heart strain has been associated with an increased risk of morbidity and mortality.  Small left pleural effusion and atelectasis without frank infarction.  Findings discussed with and reconfirmed by Dr.JOHN BEDNAR on5/14/2015at9:08 PM.   Electronically Signed   By: Elon Alas   On: 10/14/2013 21:11    Scheduled Meds: . calcium carbonate  1 tablet Oral Daily  . LORazepam  1 mg Oral 3 times per day  . OxyCODONE  10 mg Oral Q12H  . pantoprazole  40 mg Oral Daily  . pramipexole  1 mg Oral QHS  . pregabalin  150 mg Oral TID  . senna-docusate  2 tablet Oral BID  . sodium chloride  3 mL Intravenous Q12H  . warfarin  5 mg Oral ONCE-1800  . Warfarin - Pharmacist Dosing Inpatient   Does not apply q1800   Continuous Infusions: . sodium chloride 50 mL/hr at 10/16/13 0350  . heparin 1,650 Units/hr (10/16/13 0348)    Principal Problem:   PE (pulmonary embolism) Active Problems:   HYPERLIPIDEMIA   Depression   Overweight (BMI 25.0-29.9)   Generalized anxiety disorder   GERD (gastroesophageal reflux disease)  Pulmonary embolism   Acute respiratory failure   Chest pain   Anemia    Time spent: [facility] Pager [phone] If 7PM-7AM, please contact night-coverage at [email or web address], password [facility] 10/16/2013, 12:25 PM  LOS: 2 days

## 2013-10-16 NOTE — Progress Notes (Signed)
[place] for Heparin>>coumadin Indication: pulmonary embolus  Allergies  Allergen Reactions  . Penicillins Hives, Shortness Of Breath and Itching  . Latex Other (See Comments)    Skin peeling (thumb)  . Lipitor [Atorvastatin]     Patient Measurements: Height: 5\' 6"  (167.6 cm) Weight: 196 lb 3.4 oz (89 kg) IBW/kg (Calculated) : 59.3  Vital Signs: Temp: 99.1 F (37.3 C) (05/16 0440) Temp src: Oral (05/16 0440) BP: 115/62 mmHg (05/16 0440) Pulse Rate: 77 (05/16 0440)  Labs:  Recent Labs  10/14/13 1920 10/15/13 0405 10/15/13 1216 10/16/13 0320  HGB 10.1* 9.3*  --  8.9*  HCT 30.4* 28.7*  --  27.4*  PLT 294 320  --  292  LABPROT 14.1  --   --  14.3  INR 1.11  --   --  1.13  HEPARINUNFRC  --  0.56 0.35 0.39  CREATININE 0.66 0.63  --  0.61    Estimated Creatinine Clearance: 71.4 ml/min (by C-G formula based on Cr of 0.61).  Assessment: 73 yo female with bilateral PE currently at goal on IV heparin at 1650 units/hr. No bleeding issues noted. CBC stable. INR relatively unchanged.  Goal of Therapy:  Heparin level 0.3-0.7 units/ml INR goal 2-3 Monitor platelets by anticoagulation protocol: Yes   Plan:  Warfarin 5mg  tonight  Daily HL/CBC/HL Increase heparin to 1650/hr   Erin Hearing PharmD., BCPS Clinical Pharmacist Pager [phone] 10/16/2013 11:41 AM

## 2013-10-17 DIAGNOSIS — J9600 Acute respiratory failure, unspecified whether with hypoxia or hypercapnia: Secondary | ICD-10-CM

## 2013-10-17 DIAGNOSIS — I2699 Other pulmonary embolism without acute cor pulmonale: Secondary | ICD-10-CM

## 2013-10-17 DIAGNOSIS — D62 Acute posthemorrhagic anemia: Secondary | ICD-10-CM

## 2013-10-17 LAB — CBC
HCT: 30.8 % — ABNORMAL LOW (ref 36.0–46.0)
Hemoglobin: 9.8 g/dL — ABNORMAL LOW (ref 12.0–15.0)
MCH: 28.7 pg (ref 26.0–34.0)
MCHC: 31.8 g/dL (ref 30.0–36.0)
MCV: 90.1 fL (ref 78.0–100.0)
PLATELETS: 310 10*3/uL (ref 150–400)
RBC: 3.42 MIL/uL — ABNORMAL LOW (ref 3.87–5.11)
RDW: 15.5 % (ref 11.5–15.5)
WBC: 6.9 10*3/uL (ref 4.0–10.5)

## 2013-10-17 LAB — BASIC METABOLIC PANEL
BUN: 7 mg/dL (ref 6–23)
CALCIUM: 8.8 mg/dL (ref 8.4–10.5)
CO2: 20 meq/L (ref 19–32)
CREATININE: 0.56 mg/dL (ref 0.50–1.10)
Chloride: 108 mEq/L (ref 96–112)
GFR calc Af Amer: >90 mL/min (ref >90)
GFR calc non Af Amer: >90 mL/min (ref >90)
GLUCOSE: 93 mg/dL (ref 70–99)
Potassium: 4.3 mEq/L (ref 3.7–5.3)
Sodium: 142 mEq/L (ref 137–147)

## 2013-10-17 LAB — HEPARIN LEVEL (UNFRACTIONATED): Heparin Unfractionated: 0.48 IU/mL (ref 0.30–0.70)

## 2013-10-17 LAB — PROTIME-INR
INR: 1.17 (ref 0.00–1.49)
Prothrombin Time: 14.7 seconds (ref 11.6–15.2)

## 2013-10-17 MED ORDER — WARFARIN SODIUM 5 MG PO TABS
5.0000 mg | ORAL_TABLET | Freq: Once | ORAL | Status: AC
  Administered 2013-10-17: 5 mg via ORAL
  Filled 2013-10-17: qty 1

## 2013-10-17 MED ORDER — PRAMIPEXOLE DIHYDROCHLORIDE 1 MG PO TABS
1.0000 mg | ORAL_TABLET | Freq: Once | ORAL | Status: AC
  Administered 2013-10-17: 1 mg via ORAL
  Filled 2013-10-17: qty 1

## 2013-10-17 MED ORDER — PRAMIPEXOLE DIHYDROCHLORIDE 1 MG PO TABS
1.0000 mg | ORAL_TABLET | Freq: Two times a day (BID) | ORAL | Status: DC
  Administered 2013-10-17 – 2013-10-20 (×6): 1 mg via ORAL
  Filled 2013-10-17 (×8): qty 1

## 2013-10-17 MED ORDER — MOMETASONE FURO-FORMOTEROL FUM 100-5 MCG/ACT IN AERO
2.0000 | INHALATION_SPRAY | Freq: Two times a day (BID) | RESPIRATORY_TRACT | Status: DC
  Administered 2013-10-17 – 2013-10-20 (×6): 2 via RESPIRATORY_TRACT
  Filled 2013-10-17: qty 8.8

## 2013-10-17 MED ORDER — PRAMIPEXOLE DIHYDROCHLORIDE 1 MG PO TABS: 1.0000 mg | ORAL_TABLET | Freq: Two times a day (BID) | ORAL | Status: DC

## 2013-10-17 NOTE — Progress Notes (Signed)
[facility] PROGRESS NOTE  Felicia Cruz MVH:[id number] DOB: January 19, 1941 DOA: 10/14/2013 PCP: Joycelyn Man, MD  Assessment/Plan: 1. Pulmonary Embolism -Doing well out of bed. No SOB noted -increase activity as tolerated -warfarin started INR this morning 1.17  2. S/P Right Recent Right Hip Surgery -currently pain free -wound care notes appreciated  3. Anemia -likely related to recent surgery -Started on iron  4. RLS -continue home medications  5. GERD -Stable at this time -continue PPI -she has a cough suspect related to Hiatal hernia surgery that failed -suggested a repeat UGI and dulera for cough  6. Hypokalemia -repeat labs show improvement  7. Hyponatremia -repeat labs improved -will stop IV NS  Code Status: Full Code Family Communication: No family present (indicate person spoken with, relationship, and if by phone, the number) Disposition Plan: Home   Consultants:  None  Procedures:  None  Antibiotics:  None  HPI/Subjective: 89 yow female with history of recent right hip surgery performed at [facility] on Oct 06, 2013 presented to the ED with increased shortness of breath and chest pain. She was found to have bilateral Pulmonary Embolism with evidence of right heart strain. Critical Care service felt no need for tPA as she was hemodynamically stable. She is doing well out of bed in her chair presently. No distress. She has had a cough which appears to be chronic and has had a history of reflux from a hiatal hernia. Also has some episodic wheeze noted.  Objective: Filed Vitals:   10/17/13 0458  BP: 128/52  Pulse: 63  Temp: 98.1 F (36.7 C)  Resp: 17    Intake/Output Summary (Last 24 hours) at 10/17/13 0940 Last data filed at 10/17/13 0802  Gross per 24 hour  Intake 1442.5 ml  Output    700 ml  Net  742.5 ml   Filed Weights   10/15/13 0500 10/16/13 0440 10/17/13 0446  Weight: 88.5 kg (195 lb 1.7 oz) 89 kg (196 lb 3.4 oz)  90.357 kg (199 lb 3.2 oz)    Exam:   General:  Awake and alert comfortable no distress  Cardiovascular: RRR no gallop or rub noted S1 S2 is normal  Respiratory: good air entry no ronchi noted  Abdomen: soft non-tender no rebound  Musculoskeletal: No joint pain noted grossly  Data Reviewed: Basic Metabolic Panel:  Recent Labs Lab 10/14/13 1920 10/15/13 0405 10/16/13 0320 10/17/13 0743  NA 139 134* 140 142  K 3.9 3.5* 4.1 4.3  CL 101 100 107 108  CO2 22 21 22 20   GLUCOSE 114* 128* 99 93  BUN 11 11 8 7   CREATININE 0.66 0.63 0.61 0.56  CALCIUM 9.1 8.8 8.6 8.8   Liver Function Tests: No results found for this basename: AST, ALT, ALKPHOS, BILITOT, PROT, ALBUMIN,  in the last 168 hours No results found for this basename: LIPASE, AMYLASE,  in the last 168 hours No results found for this basename: AMMONIA,  in the last 168 hours CBC:  Recent Labs Lab 10/14/13 1920 10/15/13 0405 10/16/13 0320 10/17/13 0743  WBC 9.3 8.5 7.1 6.9  HGB 10.1* 9.3* 8.9* 9.8*  HCT 30.4* 28.7* 27.4* 30.8*  MCV 86.1 86.7 88.4 90.1  PLT 294 320 292 310   Cardiac Enzymes: No results found for this basename: CKTOTAL, CKMB, CKMBINDEX, TROPONINI,  in the last 168 hours BNP (last 3 results) No results found for this basename: PROBNP,  in the last 8760 hours CBG: No results found for this basename: GLUCAP,  in the last 168 hours  No results found for this or any previous visit (from the past 240 hour(s)).   Studies: No results found.  Scheduled Meds: . calcium carbonate  1 tablet Oral Daily  . ferrous sulfate  325 mg Oral Q breakfast  . LORazepam  1 mg Oral 3 times per day  . mometasone-formoterol  2 puff Inhalation BID  . OxyCODONE  10 mg Oral Q12H  . pantoprazole  40 mg Oral Daily  . pramipexole  1 mg Oral QHS  . pregabalin  150 mg Oral TID  . senna-docusate  2 tablet Oral BID  . sodium chloride  3 mL Intravenous Q12H  . Warfarin - Pharmacist Dosing Inpatient   Does not apply q1800    Continuous Infusions: . sodium chloride 50 mL/hr at 10/16/13 1632  . heparin 1,650 Units/hr (10/16/13 1840)    Principal Problem:   PE (pulmonary embolism) Active Problems:   HYPERLIPIDEMIA   Depression   Overweight (BMI 25.0-29.9)   Generalized anxiety disorder   GERD (gastroesophageal reflux disease)   Pulmonary embolism   Acute respiratory failure   Chest pain   Anemia    Time spent: [place] [facility] Pager [phone] If 7PM-7AM, please contact night-coverage at [email or web address], password [facility] 10/17/2013, 9:40 AM  LOS: 3 days

## 2013-10-17 NOTE — Progress Notes (Signed)
[facility] for Heparin>>coumadin Indication: pulmonary embolus  Allergies  Allergen Reactions  . Penicillins Hives, Shortness Of Breath and Itching  . Latex Other (See Comments)    Skin peeling (thumb)  . Lipitor [Atorvastatin]     Patient Measurements: Height: 5\' 6"  (167.6 cm) Weight: 199 lb 3.2 oz (90.357 kg) IBW/kg (Calculated) : 59.3  Vital Signs: Temp: 98.1 F (36.7 C) (05/17 0458) Temp src: Oral (05/17 0458) BP: 128/52 mmHg (05/17 0458) Pulse Rate: 63 (05/17 0458)  Labs:  Recent Labs  10/14/13 1920  10/15/13 0405 10/15/13 1216 10/16/13 0320 10/17/13 0743  HGB 10.1*  --  9.3*  --  8.9* 9.8*  HCT 30.4*  --  28.7*  --  27.4* 30.8*  PLT 294  --  320  --  292 310  LABPROT 14.1  --   --   --  14.3 14.7  INR 1.11  --   --   --  1.13 1.17  HEPARINUNFRC  --   < > 0.56 0.35 0.39 0.48  CREATININE 0.66  --  0.63  --  0.61 0.56  < > = values in this interval not displayed.  Estimated Creatinine Clearance: 71.9 ml/min (by C-G formula based on Cr of 0.56).  Assessment: 73 yo female with bilateral PE currently at goal on IV heparin at 1650 units/hr. No bleeding issues noted. H/H trending up. Plt stable. INR relatively unchanged.  Goal of Therapy:  Heparin level 0.3-0.7 units/ml INR goal 2-3 Monitor platelets by anticoagulation protocol: Yes   Plan:  Repeat Warfarin 5mg  tonight  Daily HL/CBC/INR Continue heparin at 1650/hr  Monitor for s/s of bleeding   Albertina Parr, PharmD.  Clinical Pharmacist Pager [phone]

## 2013-10-18 ENCOUNTER — Inpatient Hospital Stay (HOSPITAL_COMMUNITY): Payer: Medicare Other

## 2013-10-18 ENCOUNTER — Ambulatory Visit: Payer: Medicare Other | Admitting: Physical Therapy

## 2013-10-18 LAB — BASIC METABOLIC PANEL
BUN: 5 mg/dL — ABNORMAL LOW (ref 6–23)
CALCIUM: 8.5 mg/dL (ref 8.4–10.5)
CO2: 23 meq/L (ref 19–32)
Chloride: 108 mEq/L (ref 96–112)
Creatinine, Ser: 0.56 mg/dL (ref 0.50–1.10)
GFR calc Af Amer: >90 mL/min (ref >90)
GFR calc non Af Amer: >90 mL/min (ref >90)
Glucose, Bld: 92 mg/dL (ref 70–99)
Potassium: 3.9 mEq/L (ref 3.7–5.3)
SODIUM: 142 meq/L (ref 137–147)

## 2013-10-18 LAB — CBC
HCT: 27.5 % — ABNORMAL LOW (ref 36.0–46.0)
Hemoglobin: 8.8 g/dL — ABNORMAL LOW (ref 12.0–15.0)
MCH: 28.1 pg (ref 26.0–34.0)
MCHC: 32 g/dL (ref 30.0–36.0)
MCV: 87.9 fL (ref 78.0–100.0)
PLATELETS: 344 10*3/uL (ref 150–400)
RBC: 3.13 MIL/uL — ABNORMAL LOW (ref 3.87–5.11)
RDW: 15.6 % — AB (ref 11.5–15.5)
WBC: 6.5 10*3/uL (ref 4.0–10.5)

## 2013-10-18 LAB — PROTIME-INR
INR: 1.27 (ref 0.00–1.49)
Prothrombin Time: 15.6 seconds — ABNORMAL HIGH (ref 11.6–15.2)

## 2013-10-18 LAB — HEPARIN LEVEL (UNFRACTIONATED): Heparin Unfractionated: 0.37 IU/mL (ref 0.30–0.70)

## 2013-10-18 MED ORDER — RIVAROXABAN 15 MG PO TABS
15.0000 mg | ORAL_TABLET | Freq: Two times a day (BID) | ORAL | Status: DC
  Administered 2013-10-18 – 2013-10-20 (×4): 15 mg via ORAL
  Filled 2013-10-18 (×7): qty 1

## 2013-10-18 MED ORDER — WARFARIN SODIUM 5 MG PO TABS
5.0000 mg | ORAL_TABLET | Freq: Once | ORAL | Status: DC
  Filled 2013-10-18: qty 1

## 2013-10-18 NOTE — Progress Notes (Signed)
[facility] PROGRESS NOTE  Felicia Cruz EVO:[id number] DOB: [birth year] DOA: 10/14/2013 PCP: Joycelyn Man, MD  Assessment/Plan: Pulmonary Embolism -Doing well out of bed. No SOB noted -increase activity as tolerated -will change to oral anticoagulatant -called Dr. Genia Del who did surgery 2 weeks ago to discuss case but office was closed and operator would NOT page him as directed by message in his office -denies history of bleeding  S/P Right Recent Right Hip Surgery -currently pain free -wound care notes appreciated  Anemia -likely related to recent surgery -Started on iron  RLS -continue home medications  GERD -Stable at this time -continue PPI -she has a cough suspect related to Hiatal hernia surgery that failed -suggested a repeat UGI and dulera for cough  Hypokalemia -repeat labs show improvement  Hyponatremia -repeat labs improved   Code Status: Full Code Family Communication: No family present (indicate person spoken with, relationship, and if by phone, the number) Disposition Plan: Home   Consultants:  None  Procedures:  None  Antibiotics:  None  HPI/Subjective: 59 yow female with history of recent right hip surgery performed at [facility] on Oct 06, 2013 presented to the ED with increased shortness of breath and chest pain. She was found to have bilateral Pulmonary Embolism with evidence of right heart strain. Critical Care service felt no need for tPA as she was hemodynamically stable. She is doing well out of bed in her chair presently. No distress. She has had a cough which appears to be chronic and has had a history of reflux from a hiatal hernia. Also has some episodic wheeze noted.  Asking for newer agent for anticoagulation -does not want coumadin   Objective: Filed Vitals:   10/18/13 0433  BP: 140/55  Pulse: 62  Temp: 97.9 F (36.6 C)  Resp: 18    Intake/Output Summary (Last 24 hours) at 10/18/13 1139 Last  data filed at 10/18/13 0636  Gross per 24 hour  Intake  322.5 ml  Output    950 ml  Net -627.5 ml   Filed Weights   10/16/13 0440 10/17/13 0446 10/18/13 0636  Weight: 89 kg (196 lb 3.4 oz) 90.357 kg (199 lb 3.2 oz) 90.266 kg (199 lb)    Exam:   General:  Awake and alert comfortable no distress  Cardiovascular: RRR no gallop or rub noted S1 S2 is normal  Respiratory: good air entry no ronchi noted  Abdomen: soft non-tender no rebound  Musculoskeletal: No joint pain noted grossly  Data Reviewed: Basic Metabolic Panel:  Recent Labs Lab 10/14/13 1920 10/15/13 0405 10/16/13 0320 10/17/13 0743 10/18/13 0420  NA 139 134* 140 142 142  K 3.9 3.5* 4.1 4.3 3.9  CL 101 100 107 108 108  CO2 22 21 22 20 23   GLUCOSE 114* 128* 99 93 92  BUN 11 11 8 7  5*  CREATININE 0.66 0.63 0.61 0.56 0.56  CALCIUM 9.1 8.8 8.6 8.8 8.5   Liver Function Tests: No results found for this basename: AST, ALT, ALKPHOS, BILITOT, PROT, ALBUMIN,  in the last 168 hours No results found for this basename: LIPASE, AMYLASE,  in the last 168 hours No results found for this basename: AMMONIA,  in the last 168 hours CBC:  Recent Labs Lab 10/14/13 1920 10/15/13 0405 10/16/13 0320 10/17/13 0743 10/18/13 0420  WBC 9.3 8.5 7.1 6.9 6.5  HGB 10.1* 9.3* 8.9* 9.8* 8.8*  HCT 30.4* 28.7* 27.4* 30.8* 27.5*  MCV 86.1 86.7 88.4 90.1 87.9  PLT 294  320 292 310 344   Cardiac Enzymes: No results found for this basename: CKTOTAL, CKMB, CKMBINDEX, TROPONINI,  in the last 168 hours BNP (last 3 results) No results found for this basename: PROBNP,  in the last 8760 hours CBG: No results found for this basename: GLUCAP,  in the last 168 hours  No results found for this or any previous visit (from the past 240 hour(s)).   Studies: No results found.  Scheduled Meds: . calcium carbonate  1 tablet Oral Daily  . ferrous sulfate  325 mg Oral Q breakfast  . LORazepam  1 mg Oral 3 times per day  . mometasone-formoterol   2 puff Inhalation BID  . OxyCODONE  10 mg Oral Q12H  . pantoprazole  40 mg Oral Daily  . pramipexole  1 mg Oral BID  . pregabalin  150 mg Oral TID  . senna-docusate  2 tablet Oral BID  . sodium chloride  3 mL Intravenous Q12H  . warfarin  5 mg Oral ONCE-1800  . Warfarin - Pharmacist Dosing Inpatient   Does not apply q1800   Continuous Infusions: . heparin 1,650 Units/hr (10/18/13 0250)    Principal Problem:   PE (pulmonary embolism) Active Problems:   HYPERLIPIDEMIA   Depression   Overweight (BMI 25.0-29.9)   Generalized anxiety disorder   GERD (gastroesophageal reflux disease)   Pulmonary embolism   Acute respiratory failure   Chest pain   Anemia    Time spent: [facility] Pager [phone] If 7PM-7AM, please contact night-coverage at [email or web address], password [facility] 10/18/2013, 11:39 AM  LOS: 4 days

## 2013-10-18 NOTE — Discharge Instructions (Signed)
Information on my medicine - XARELTO (rivaroxaban)  This medication education was reviewed with me or my healthcare representative as part of my discharge preparation.  The pharmacist that spoke with me during my hospital stay was:    WHY WAS XARELTO PRESCRIBED FOR YOU? Xarelto was prescribed to treat blood clots that may have been found in the veins of your legs (deep vein thrombosis) or in your lungs (pulmonary embolism) and to reduce the risk of them occurring again.  What do you need to know about Xarelto? The starting dose is one 15 mg tablet taken TWICE daily with food for the FIRST 21 DAYS then on 11/09/13  the dose is changed to one 20 mg tablet taken ONCE A DAY with your evening meal.  DO NOT stop taking Xarelto without talking to the health care provider who prescribed the medication.  Refill your prescription for 20 mg tablets before you run out.  After discharge, you should have regular check-up appointments with your healthcare provider that is prescribing your Xarelto.  In the future your dose may need to be changed if your kidney function changes by a significant amount.  What do you do if you miss a dose? If you are taking Xarelto TWICE DAILY and you miss a dose, take it as soon as you remember. You may take two 15 mg tablets (total 30 mg) at the same time then resume your regularly scheduled 15 mg twice daily the next day.  If you are taking Xarelto ONCE DAILY and you miss a dose, take it as soon as you remember on the same day then continue your regularly scheduled once daily regimen the next day. Do not take two doses of Xarelto at the same time.   Important Safety Information Xarelto is a blood thinner medicine that can cause bleeding. You should call your healthcare provider right away if you experience any of the following:   Bleeding from an injury or your nose that does not stop.   Unusual colored urine (red or dark brown) or unusual colored stools (red or  black).   Unusual bruising for unknown reasons.   A serious fall or if you hit your head (even if there is no bleeding).  Some medicines may interact with Xarelto and might increase your risk of bleeding while on Xarelto. To help avoid this, consult your healthcare provider or pharmacist prior to using any new prescription or non-prescription medications, including herbals, vitamins, non-steroidal anti-inflammatory drugs (NSAIDs) and supplements.  This website has more information on Xarelto: [email or web address]

## 2013-10-18 NOTE — Progress Notes (Signed)
ANTICOAGULATION CONSULT NOTE - Follow Up Consult  Pharmacy Consult for Heparin/Coumadin Indication: pulmonary embolus  Allergies  Allergen Reactions  . Penicillins Hives, Shortness Of Breath and Itching  . Latex Other (See Comments)    Skin peeling (thumb)  . Lipitor [Atorvastatin]     Patient Measurements: Height: 5\' 6"  (167.6 cm) Weight: 199 lb (90.266 kg) IBW/kg (Calculated) : 59.3 Heparin Dosing Weight: 79 kg  Vital Signs: Temp: 97.9 F (36.6 C) (05/18 0433) Temp src: Oral (05/18 0433) BP: 140/55 mmHg (05/18 0433) Pulse Rate: 62 (05/18 0433)  Labs:  Recent Labs  10/16/13 0320 10/17/13 0743 10/18/13 0420  HGB 8.9* 9.8* 8.8*  HCT 27.4* 30.8* 27.5*  PLT 292 310 344  LABPROT 14.3 14.7 15.6*  INR 1.13 1.17 1.27  HEPARINUNFRC 0.39 0.48 0.37  CREATININE 0.61 0.56 0.56    Estimated Creatinine Clearance: 71.9 ml/min (by C-G formula based on Cr of 0.56).    Assessment: 73 yo F admitted 10/14/2013 S/p recent hip surgery with sharp pleuritic chest pain, with BL PE on CT. Marland Kitchen  AntiCoag:bilateral PE w/R heart strain, patient denies bleeding, HL 0.37at goal. Added warfarin 5/15, INR 1.27  Cardiovascular:vss, LVEF 60-65%. VSS  Gastrointestinal / Nutrition-GERD on Ca, FESO4, po PPI, Senna S  Neurology-RLS - mirapex,lyrica, Ativan, Oxycontin  Nephrology:renal fx normal  Pulmonary:bilater pe, asthma on [facility]  Hematology / Oncology: Hgb 8.8 down on FESO4.  PTA Medication Issues:home meds addressed  Best Practices:heparin, po PPI  **Patient wants to be transitioned to a NOAC - educated on coumadin, riva, and apix. Need to add appropriate d/c handout once decision finalized  Goal of Therapy:  Heparin level 0.3-0.7 units/ml INR 2-3 Monitor platelets by anticoagulation protocol: Yes   Plan:  Heparin 1650 units/r with daily HL and CBC Repeat Coumadin 5mg  po x 1 tonight. Daily INR Change to NOAC?   Cande Mastropietro S. Alford Highland, PharmD, [facility] Clinical Staff  Pharmacist Pager [phone]  [facility] Alford Highland 10/18/2013,9:20 AM

## 2013-10-18 NOTE — Progress Notes (Signed)
ANTICOAGULATION CONSULT NOTE - Follow Up Consult  Pharmacy Consult for Xarelto Indication: pulmonary embolus  Allergies  Allergen Reactions  . Penicillins Hives, Shortness Of Breath and Itching  . Latex Other (See Comments)    Skin peeling (thumb)  . Lipitor [Atorvastatin]     Patient Measurements: Height: 5\' 6"  (167.6 cm) Weight: 199 lb (90.266 kg) IBW/kg (Calculated) : 59.3 Heparin Dosing Weight: 79 kg  Vital Signs: Temp: 97.9 F (36.6 C) (05/18 0433) Temp src: Oral (05/18 0433) BP: 140/55 mmHg (05/18 0433) Pulse Rate: 62 (05/18 0433)  Labs:  Recent Labs  10/16/13 0320 10/17/13 0743 10/18/13 0420  HGB 8.9* 9.8* 8.8*  HCT 27.4* 30.8* 27.5*  PLT 292 310 344  LABPROT 14.3 14.7 15.6*  INR 1.13 1.17 1.27  HEPARINUNFRC 0.39 0.48 0.37  CREATININE 0.61 0.56 0.56    Estimated Creatinine Clearance: 71.9 ml/min (by C-G formula based on Cr of 0.56).    Assessment: 73 yo F admitted 10/14/2013 S/p recent hip surgery with sharp pleuritic chest pain, with BL PE on CT. Marland Kitchen  AntiCoag: bilateral PE w/R heart strain and desire to be anticoagulated with NOAC. Xarelto chosed by MD  Goal of Therapy:  Therapeutic oral anticoagulation Monitor platelets by anticoagulation protocol: Yes   Plan:  D/c heparin tonight when Xarelto started Xarelto 15mg  BID x 21d then 20mg  daily.   Khyler Urda S. Alford Highland, PharmD, [facility] Clinical Staff Pharmacist Pager [phone]  [facility] 10/18/2013,12:18 PM

## 2013-10-18 NOTE — Progress Notes (Signed)
PT Cancellation Note  Patient Details Name: Felicia Cruz MRN: [id number] DOB: 12-28-1940   Cancelled Treatment:    Reason Eval/Treat Not Completed: Other (comment) (pt nauseated and vomiting).  Will return tomorrow.  Thanks.   [facility] 10/18/2013, 11:41 AM [facility] [phone] (pager)

## 2013-10-19 LAB — BASIC METABOLIC PANEL
BUN: 5 mg/dL — AB (ref 6–23)
CALCIUM: 8.8 mg/dL (ref 8.4–10.5)
CO2: 28 meq/L (ref 19–32)
CREATININE: 0.64 mg/dL (ref 0.50–1.10)
Chloride: 111 mEq/L (ref 96–112)
GFR calc Af Amer: >90 mL/min (ref >90)
GFR calc non Af Amer: 87 mL/min — ABNORMAL LOW (ref >90)
GLUCOSE: 91 mg/dL (ref 70–99)
Potassium: 4.6 mEq/L (ref 3.7–5.3)
Sodium: 147 mEq/L (ref 137–147)

## 2013-10-19 LAB — CBC
HCT: 28.7 % — ABNORMAL LOW (ref 36.0–46.0)
Hemoglobin: 9.3 g/dL — ABNORMAL LOW (ref 12.0–15.0)
MCH: 28.7 pg (ref 26.0–34.0)
MCHC: 32.4 g/dL (ref 30.0–36.0)
MCV: 88.6 fL (ref 78.0–100.0)
PLATELETS: 377 10*3/uL (ref 150–400)
RBC: 3.24 MIL/uL — AB (ref 3.87–5.11)
RDW: 15.7 % — ABNORMAL HIGH (ref 11.5–15.5)
WBC: 6.1 10*3/uL (ref 4.0–10.5)

## 2013-10-19 MED ORDER — POLYETHYLENE GLYCOL 3350 17 G PO PACK
17.0000 g | PACK | Freq: Every day | ORAL | Status: DC
  Administered 2013-10-19: 17 g via ORAL
  Filled 2013-10-19 (×2): qty 1

## 2013-10-19 NOTE — Care Management Note (Unsigned)
    Page 1 of 1   10/19/2013     3:59:34 PM CARE MANAGEMENT NOTE 10/19/2013  Patient:  Felicia Cruz, Felicia Cruz   Account Number:  [id number]  Date Initiated:  10/19/2013  Documentation initiated by:  Draedyn Weidinger  Subjective/Objective Assessment:   Pt adm on 5/14 with PE s/p hip surgery on 10/06/13.  PTA, pt independent, resides at home with family.     Action/Plan:   Prior auth completed by MD for Xarelto.  Pt will have $45 copay for Xarelto at dc.  Free 30 day trial card given to pt for first RX.   Anticipated DC Date:  10/20/2013   Anticipated DC Plan:  [place]  CM consult  Medication Assistance      Choice offered to / List presented to:             Status of service:  In process, will continue to follow Medicare Important Message given?   (If response is "NO", the following Medicare IM given date fields will be blank) Date Medicare IM given:   Date Additional Medicare IM given:  10/19/2013  Discharge Disposition:  HOME/SELF CARE  Per UR Regulation:  Reviewed for med. necessity/level of care/duration of stay  If discussed at [place] of Stay Meetings, dates discussed:   10/19/2013    Comments:

## 2013-10-19 NOTE — Progress Notes (Signed)
[facility] PROGRESS NOTE  Felicia Cruz XVQ:[id number] DOB: [birth year] DOA: 10/14/2013 PCP: Joycelyn Man, MD  Assessment/Plan: Pulmonary Embolism -Doing well out of bed. No SOB noted -increase activity as tolerated -will change to oral anticoagulatant -called Dr. Genia Del who did surgery 2 weeks ago to discuss case but office was closed and operator would NOT page him as directed by message in his office -denies history of bleeding -called and got prior auth for xarelto -coupon given  S/P Right Recent Right Hip Surgery -currently pain free -wound care notes appreciated  Anemia -likely related to recent surgery -Started on iron  RLS -continue home medications  GERD -Stable at this time -continue PPI -she has a cough suspect related to Hiatal hernia surgery that failed -suggested a repeat UGI and dulera for cough  Hypokalemia -repeat labs show improvement  Hyponatremia -repeat labs improved   Code Status: Full Code Family Communication: No family present (indicate person spoken with, relationship, and if by phone, the number) Disposition Plan: Home in AM if Hgb stable   Consultants:  None  Procedures:  None  Antibiotics:  None  HPI/Subjective: 52 yow female with history of recent right hip surgery performed at [facility] on Oct 06, 2013 presented to the ED with increased shortness of breath and chest pain. She was found to have bilateral Pulmonary Embolism with evidence of right heart strain. Critical Care service felt no need for tPA as she was hemodynamically stable. She is doing well out of bed in her chair presently. No distress. She has had a cough which appears to be chronic and has had a history of reflux from a hiatal hernia. Also has some episodic wheeze noted.  Asking for newer agent for anticoagulation -does not want coumadin No SOB  Objective: Filed Vitals:   10/19/13 0632  BP: 110/69  Pulse: 58  Temp: 97.8 F (36.6 C)   Resp: 18    Intake/Output Summary (Last 24 hours) at 10/19/13 1354 Last data filed at 10/18/13 1900  Gross per 24 hour  Intake    240 ml  Output    350 ml  Net   -110 ml   Filed Weights   10/17/13 0446 10/18/13 0636 10/19/13 0521  Weight: 90.357 kg (199 lb 3.2 oz) 90.266 kg (199 lb) 90.084 kg (198 lb 9.6 oz)    Exam:   General:  Awake and alert comfortable no distress  Cardiovascular: RRR no gallop or rub noted S1 S2 is normal  Respiratory: good air entry no ronchi noted  Abdomen: soft non-tender no rebound  Musculoskeletal: No joint pain noted grossly  Data Reviewed: Basic Metabolic Panel:  Recent Labs Lab 10/15/13 0405 10/16/13 0320 10/17/13 0743 10/18/13 0420 10/19/13 0516  NA 134* 140 142 142 147  K 3.5* 4.1 4.3 3.9 4.6  CL 100 107 108 108 111  CO2 21 22 20 23 28   GLUCOSE 128* 99 93 92 91  BUN 11 8 7  5* 5*  CREATININE 0.63 0.61 0.56 0.56 0.64  CALCIUM 8.8 8.6 8.8 8.5 8.8   Liver Function Tests: No results found for this basename: AST, ALT, ALKPHOS, BILITOT, PROT, ALBUMIN,  in the last 168 hours No results found for this basename: LIPASE, AMYLASE,  in the last 168 hours No results found for this basename: AMMONIA,  in the last 168 hours CBC:  Recent Labs Lab 10/15/13 0405 10/16/13 0320 10/17/13 0743 10/18/13 0420 10/19/13 0516  WBC 8.5 7.1 6.9 6.5 6.1  HGB 9.3* 8.9* 9.8*  8.8* 9.3*  HCT 28.7* 27.4* 30.8* 27.5* 28.7*  MCV 86.7 88.4 90.1 87.9 88.6  PLT 320 292 310 344 377   Cardiac Enzymes: No results found for this basename: CKTOTAL, CKMB, CKMBINDEX, TROPONINI,  in the last 168 hours BNP (last 3 results) No results found for this basename: PROBNP,  in the last 8760 hours CBG: No results found for this basename: GLUCAP,  in the last 168 hours  No results found for this or any previous visit (from the past 240 hour(s)).   Studies: Dg Ugi W/kub  10/18/2013   CLINICAL DATA:  Chronic cough.  EXAM: UPPER GI SERIES WITH KUB  TECHNIQUE: After  obtaining a scout radiograph a routine upper GI series was performed using thin and high density barium.  FLUOROSCOPY TIME:  1 min 49 seconds  COMPARISON:  Esophagram dated 04/23/2011  FINDINGS: KUB demonstrates a normal bowel gas pattern with moderate stool in the colon. 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  The mucosa of the esophagus appears normal. There are occasional tertiary contractions in the distal esophagus. The patient has had a Nissen fundoplication. No recurrent hiatal hernia. The patient ingested a 13 mm barium tablet and there was delay in the distal esophagus just above the Nissen fundoplication for 3-4 minutes but it did pass into the stomach without being dissolved.  The stomach and pylorus and duodenal bulb and C-loop all appear normal. Proximal small bowel is normal.  There was no spontaneous gastroesophageal reflux. No visible esophagitis.  IMPRESSION: 1. Slight esophageal motility disorder with tertiary contractions and delayed passage of the barium tablet through the gastroesophageal junction. 2. Nissen fundoplication with no recurrent hernia. No demonstrable reflux or esophagitis. 3. Normal appearing stomach.   Electronically Signed   By: Rozetta Nunnery M.D.   On: 10/18/2013 11:44    Scheduled Meds: . calcium carbonate  1 tablet Oral Daily  . ferrous sulfate  325 mg Oral Q breakfast  . LORazepam  1 mg Oral 3 times per day  . mometasone-formoterol  2 puff Inhalation BID  . OxyCODONE  10 mg Oral Q12H  . pantoprazole  40 mg Oral Daily  . polyethylene glycol  17 g Oral Daily  . pramipexole  1 mg Oral BID  . pregabalin  150 mg Oral TID  . rivaroxaban  15 mg Oral BID  . senna-docusate  2 tablet Oral BID  . sodium chloride  3 mL Intravenous Q12H   Continuous Infusions:    Principal Problem:   PE (pulmonary embolism) Active Problems:   HYPERLIPIDEMIA   Depression   Overweight (BMI 25.0-29.9)   Generalized anxiety  disorder   GERD (gastroesophageal reflux disease)   Pulmonary embolism   Acute respiratory failure   Chest pain   Anemia    Time spent: [facility] Pager [phone] If 7PM-7AM, please contact night-coverage at [email or web address], password [facility] 10/19/2013, 1:54 PM  LOS: 5 days

## 2013-10-19 NOTE — Progress Notes (Signed)
Physical Therapy Treatment and D/C Patient Details Name: Felicia Cruz MRN: [id number] DOB: 1940/11/03 Today's Date: 10/19/2013    History of Present Illness   Felicia Cruz is a 73 y.o. Caucasian female with history of restless leg syndrome, GERD, hyperlipidemia, asthma, status post right gluteal tendon repair on 12/28/2012 with subsequent ongoing pain, immobility, and weakness who went to [facility] and had a right hip open tendon neck to me abductors/flexor, tensor fascial glut maximus muscle tendon transfer as abductor reconstruction on 10/06/2013 by Dr. Alvan Dame who presents with the above complaints. Since last year patient reports that she has been immobile and continued to be immobile after surgery on 10/06/2013 with only minimal weightbearing on her right leg and has been ambulating using a walker. Last night she noted having left-sided pleuritic chest pain which was persistent all night. She was also short of breath. She tried albuterol inhaler with only minimal improvement. Given ongoing symptoms, she presented to the emergency department for further evaluation. CT of the chest with contrast to bilateral pulmonary emboli with right heart strain consistent with at least submassive pulmonary embolism.      PT Comments    Pt admitted with above. Pt currently without functional limitations and is ambulatingwith Modif I in room and on unit.  Pt met goals.  Pt will no longer need skilled PT.  Does not need PT f/u either.  D/C PT.     Follow Up Recommendations  No PT follow up     Equipment Recommendations  None recommended by PT    Recommendations for Other Services       Precautions / Restrictions Precautions Precautions: None Required Braces or Orthoses: Other Brace/Splint Other Brace/Splint: Hip brace right hip Restrictions Weight Bearing Restrictions: Yes RLE Weight Bearing: Partial weight bearing RLE Partial Weight Bearing Percentage or Pounds: 30%    Mobility  Bed  Mobility               General bed mobility comments: in chair  Transfers Overall transfer level: Modified independent Equipment used: Rolling walker (2 wheeled) Transfers: Sit to/from Stand Sit to Stand: Modified independent (Device/Increase time)         General transfer comment: No impulsivity.  No cues for weight bearing or hand placement.   Ambulation/Gait Ambulation/Gait assistance: Modified independent (Device/Increase time) Ambulation Distance (Feet): 200 Feet Assistive device: Rolling walker (2 wheeled) Gait Pattern/deviations: Step-to pattern;Decreased step length - right;Decreased stride length;Antalgic;Wide base of support   Gait velocity interpretation: at or above normal speed for age/gender General Gait Details: Safe with RW.  No LOB and maintains PWB 30%.     Stairs            Wheelchair Mobility    Modified Rankin (Stroke Patients Only)       Balance           Standing balance support: Bilateral upper extremity supported;During functional activity Standing balance-Leahy Scale: Fair Standing balance comment: Pt washed hands at sink.                     Cognition Arousal/Alertness: Awake/alert Behavior During Therapy: WFL for tasks assessed/performed Overall Cognitive Status: Within Functional Limits for tasks assessed                      Exercises      General Comments General comments (skin integrity, edema, etc.): Pt now wearing shirt to decr redness on back from brace.  Pt ambulated  into bathroom and Modif I with transfer and cleaned herself.        Pertinent Vitals/Pain VSS, no pain    Home Living                      Prior Function            PT Goals (current goals can now be found in the care plan section) Progress towards PT goals: Goals met/education completed, patient discharged from PT    Frequency       PT Plan Current plan remains appropriate    Co-evaluation              End of Session Equipment Utilized During Treatment: Gait belt Activity Tolerance: Patient tolerated treatment well Patient left: in chair;with call bell/phone within reach     Time: 1130-1146 PT Time Calculation (min): 16 min  Charges:  $Gait Training: 8-22 mins                    G Codes:      Dawn Dorene Ar Oct 22, 2013, 12:15 PM  [facility] [phone] (pager)

## 2013-10-20 DIAGNOSIS — R059 Cough, unspecified: Secondary | ICD-10-CM

## 2013-10-20 DIAGNOSIS — R05 Cough: Secondary | ICD-10-CM

## 2013-10-20 DIAGNOSIS — Z09 Encounter for follow-up examination after completed treatment for conditions other than malignant neoplasm: Secondary | ICD-10-CM

## 2013-10-20 DIAGNOSIS — IMO0002 Reserved for concepts with insufficient information to code with codable children: Secondary | ICD-10-CM

## 2013-10-20 LAB — BASIC METABOLIC PANEL
BUN: 6 mg/dL (ref 6–23)
CALCIUM: 8.9 mg/dL (ref 8.4–10.5)
CO2: 25 meq/L (ref 19–32)
Chloride: 108 mEq/L (ref 96–112)
Creatinine, Ser: 0.66 mg/dL (ref 0.50–1.10)
GFR calc non Af Amer: 86 mL/min — ABNORMAL LOW (ref >90)
Glucose, Bld: 98 mg/dL (ref 70–99)
Potassium: 4.3 mEq/L (ref 3.7–5.3)
Sodium: 144 mEq/L (ref 137–147)

## 2013-10-20 LAB — CBC
HEMATOCRIT: 28.8 % — AB (ref 36.0–46.0)
Hemoglobin: 9.4 g/dL — ABNORMAL LOW (ref 12.0–15.0)
MCH: 28.9 pg (ref 26.0–34.0)
MCHC: 32.6 g/dL (ref 30.0–36.0)
MCV: 88.6 fL (ref 78.0–100.0)
PLATELETS: 389 10*3/uL (ref 150–400)
RBC: 3.25 MIL/uL — ABNORMAL LOW (ref 3.87–5.11)
RDW: 15.7 % — AB (ref 11.5–15.5)
WBC: 6.9 10*3/uL (ref 4.0–10.5)

## 2013-10-20 MED ORDER — RIVAROXABAN 15 MG PO TABS: 15.0000 mg | ORAL_TABLET | Freq: Two times a day (BID) | ORAL | Status: DC

## 2013-10-20 MED ORDER — RIVAROXABAN 20 MG PO TABS: 20.0000 mg | ORAL_TABLET | Freq: Every day | ORAL | Status: DC

## 2013-10-20 MED ORDER — ALBUTEROL SULFATE HFA 108 (90 BASE) MCG/ACT IN AERS: 2.0000 | INHALATION_SPRAY | RESPIRATORY_TRACT | Status: DC | PRN

## 2013-10-20 NOTE — Discharge Summary (Signed)
Physician Discharge Summary  Felicia Cruz EXB:[id number] DOB: Apr 14, 1941 DOA: 10/14/2013  PCP: Joycelyn Man, MD  Admit date: 10/14/2013 Discharge date: 10/20/2013  Time spent: greater than 30 min  Recommendations for Outpatient Follow-up:  1.   Discharge Diagnoses:  Principal Problem:   PE (pulmonary embolism) Active Problems:   HYPERLIPIDEMIA   Depression   Overweight (BMI 25.0-29.9)   Generalized anxiety disorder   GERD (gastroesophageal reflux disease)   Pulmonary embolism   Acute respiratory failure   Chest pain   Anemia   Discharge Condition: stable  Filed Weights   10/17/13 0446 10/18/13 0636 10/19/13 0521  Weight: 90.357 kg (199 lb 3.2 oz) 90.266 kg (199 lb) 90.084 kg (198 lb 9.6 oz)    History of present illness:  73 y.o. Caucasian female with history of restless leg syndrome, GERD, hyperlipidemia, asthma, status post right gluteal tendon repair on 12/28/2012 with subsequent ongoing pain, immobility, and weakness who went to [facility] and had a right hip open tendon neck to me abductors/flexor, tensor fascial glut maximus muscle tendon transfer as abductor reconstruction on 10/06/2013 by Dr. Jeannine Kitten who presents with the above complaints. Since last year patient reports that she has been immobile and continued to be immobile after surgery on 10/06/2013 with only minimal weightbearing on her right leg and has been ambulating using a walker. Last night she noted having left-sided pleuritic chest pain which was persistent all night. She was also short of breath. She tried albuterol inhaler with only minimal improvement. Given ongoing symptoms, she presented to the emergency department for further evaluation. CT of the chest with contrast to bilateral pulmonary emboli with right heart strain consistent with at least submassive pulmonary embolism. Hospitalist service was asked to admit the patient for further care and management. Patient denies any recent fevers, chills,  nausea, vomiting, abdominal pain, diarrhea, headaches or vision changes. Currently denies any chest pain.   Hospital Course:  Admitted to tele. Started on heparin and eventually oral anticoagulation.  Doppler legs negative. Activity progressed with less dyspnea.  Will need 6 month course of anticoagulation.  Aspirin stopped while on xarelto.  Procedures:  none  Consultations:  none  Discharge Exam: Filed Vitals:   10/20/13 0531  BP: 133/55  Pulse: 68  Temp: 98.1 F (36.7 C)  Resp: 18    General: alert, oriented Cardiovascular: RRR Respiratory: CTA  Discharge Instructions You were cared for by a hospitalist during your hospital stay. If you have any questions about your discharge medications or the care you received while you were in the hospital after you are discharged, you can call the unit and asked to speak with the hospitalist on call if the hospitalist that took care of you is not available. Once you are discharged, your primary care physician will handle any further medical issues. Please note that NO REFILLS for any discharge medications will be authorized once you are discharged, as it is imperative that you return to your primary care physician (or establish a relationship with a primary care physician if you do not have one) for your aftercare needs so that they can reassess your need for medications and monitor your lab values.  Discharge Instructions   Diet general    Complete by:  As directed      Increase activity slowly    Complete by:  As directed             Medication List    STOP taking these medications  aspirin 325 MG tablet     ondansetron 4 MG tablet  Commonly known as:  ZOFRAN      TAKE these medications       acetaminophen 325 MG tablet  Commonly known as:  TYLENOL  Take 650 mg by mouth every 6 (six) hours as needed.     albuterol 108 (90 BASE) MCG/ACT inhaler  Commonly known as:  VENTOLIN HFA  Inhale 2 puffs into the lungs every  4 (four) hours as needed for wheezing or shortness of breath (or cough).     bisacodyl 5 MG EC tablet  Commonly known as:  DULCOLAX  Take 10 mg by mouth daily as needed for moderate constipation.     calcium carbonate 500 MG chewable tablet  Commonly known as:  TUMS - dosed in mg elemental calcium  Chew 1 tablet by mouth daily.     LORazepam 1 MG tablet  Commonly known as:  ATIVAN  Take 1 mg by mouth every 8 (eight) hours.     LORazepam 1 MG tablet  Commonly known as:  ATIVAN  TAKE 1 TABLET BY MOUTH EVERY EVENING     omeprazole 20 MG capsule  Commonly known as:  PRILOSEC  Take 20 mg by mouth daily.     OxyCODONE 10 mg T12a 12 hr tablet  Commonly known as:  OXYCONTIN  Take 10 mg by mouth every 12 (twelve) hours.     pramipexole 1 MG tablet  Commonly known as:  MIRAPEX  Take 1 mg by mouth at bedtime. 1 by mouth each bedtime     pregabalin 150 MG capsule  Commonly known as:  LYRICA  Take 150 mg by mouth 3 (three) times daily.     Rivaroxaban 15 MG Tabs tablet  Commonly known as:  XARELTO  Take 1 tablet (15 mg total) by mouth 2 (two) times daily. For 3 weeks, then 20 mg daily (see other prescription)     rivaroxaban 20 MG Tabs tablet  Commonly known as:  XARELTO  Take 1 tablet (20 mg total) by mouth daily with supper. Start after 15 mg tablets gone in 3 weeks  Start taking on:  11/09/2013     sennosides-docusate sodium 8.6-50 MG tablet  Commonly known as:  SENOKOT-S  Take 2 tablets by mouth 2 (two) times daily.       Allergies  Allergen Reactions  . Lipitor [Atorvastatin]     Memory loss, confusion, hallucinations  . Penicillins Hives, Shortness Of Breath and Itching  . Latex Other (See Comments)    Skin peeling (thumb)       Follow-up Information   Follow up with TODD,JEFFREY ALLEN, MD In 2 weeks.   Specialty:  Family Medicine   Contact information:   [place] [address] [phone]        The results of significant  diagnostics from this hospitalization (including imaging, microbiology, ancillary and laboratory) are listed below for reference.    Significant Diagnostic Studies: Dg Chest 2 View  10/14/2013   CLINICAL DATA:  Shortness of breath.  EXAM: CHEST  2 VIEW  COMPARISON:  Two-view chest x-ray 12/25/2010, 01/04/2008.  FINDINGS: Suboptimal inspiration accounts for crowded bronchovascular markings, especially in the bases, and accentuates the cardiac silhouette. Taking this into account, cardiac silhouette upper normal in size. Thoracic aorta mildly tortuous, unchanged. Hilar and mediastinal contours otherwise unremarkable. Mild atelectasis in the left lower lobe. Lungs otherwise clear. No localized airspace consolidation. No pleural effusions. No pneumothorax.  Normal pulmonary vascularity. Degenerative changes involving the thoracic spine.  IMPRESSION: Suboptimal inspiration accounts for mild left lower lobe atelectasis. No acute cardiopulmonary disease otherwise.   Electronically Signed   By: Evangeline Dakin M.D.   On: 10/14/2013 20:14   Ct Angio Chest Pe W/cm &/or Wo Cm  10/14/2013   CLINICAL DATA:  Recent hip surgery, acute onset severe shortness of breath not relieved with inhaler use, left-sided body pain.  EXAM: CT ANGIOGRAPHY CHEST WITH CONTRAST  TECHNIQUE: Multidetector CT imaging of the chest was performed using the standard protocol during bolus administration of intravenous contrast. Multiplanar CT image reconstructions and MIPs were obtained to evaluate the vascular anatomy.  CONTRAST:  15mL OMNIPAQUE IOHEXOL 350 MG/ML SOLN  COMPARISON:  DG CHEST 2 VIEW dated 10/14/2013; CT ABD/PELV WO CM dated 03/07/2010  FINDINGS: Bilateral pulmonary artery artery filling defects, consistent with acute pulmonary emboli, including left main pulmonary artery propagating into the left upper lobe with nonocclusive embolus, occlusive left lower lobe segmental pulmonary embolism. Nonocclusive right upper lobe and right middle  lobe acute lobar to segmental pulmonary emboli. Heart size is mildly enlarged with right heart strain (RV 35 mm, LV 28 mm). Pericardium is unremarkable.  Thoracic aorta is normal in course and caliber, unremarkable. Tracheobronchial tree is patent and midline, no pneumothorax. Left lower lobe and lingular atelectasis with small left pleural effusion.  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 Small hiatal hernia. Mild degenerative change of thoracic spine.  Review of the MIP images confirms the above findings.  IMPRESSION: Bilateral acute pulmonary emboli, including left pulmonary artery extending into the lobar branches, with occlusive left lower lobe segmental filling defect.  Positive for acute PE with CT evidence of right heart strain (RV/LV Ratio = 35/28) consistent with at least submassive (intermediate risk) PE. The presence of right heart strain has been associated with an increased risk of morbidity and mortality.  Small left pleural effusion and atelectasis without frank infarction.  Findings discussed with and reconfirmed by Dr.JOHN BEDNAR on5/14/2015at9:08 PM.   Electronically Signed   By: Elon Alas   On: 10/14/2013 21:11   Dg Duanne Limerick W/kub  10/18/2013   CLINICAL DATA:  Chronic cough.  EXAM: UPPER GI SERIES WITH KUB  TECHNIQUE: After obtaining a scout radiograph a routine upper GI series was performed using thin and high density barium.  FLUOROSCOPY TIME:  1 min 49 seconds  COMPARISON:  Esophagram dated 04/23/2011  FINDINGS: KUB demonstrates a normal bowel gas pattern with moderate stool in the colon. 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  The mucosa of the esophagus appears normal. There are occasional tertiary contractions in the distal esophagus. The patient has had a Nissen fundoplication. No  recurrent hiatal hernia. The patient ingested a 13 mm barium tablet and there was delay in the distal esophagus just above the Nissen fundoplication for 3-4 minutes but it did pass into the stomach without being dissolved.  The stomach and pylorus and duodenal bulb and C-loop all appear normal. Proximal small bowel is normal.  There was no spontaneous gastroesophageal reflux. No visible esophagitis.  IMPRESSION: 1. Slight esophageal motility disorder with tertiary contractions and delayed passage of the barium tablet through the gastroesophageal junction. 2. Nissen fundoplication with no recurrent hernia. No demonstrable reflux or esophagitis. 3. Normal appearing stomach.   Electronically  Signed   By: Rozetta Nunnery M.D.   On: 10/18/2013 11:44   Echo Left ventricle: The cavity size was mildly dilated. Systolic function was normal. The estimated ejection fraction was in the range of 60% to 65%. Wall motion was normal; there were no regional wall motion abnormalities. Doppler parameters are consistent with abnormal left ventricular relaxation (grade 1 diastolic dysfunction). There was no evidence of elevated ventricular filling pressure by Doppler parameters. - Aortic valve: No regurgitation. - Aortic root: The aortic root was normal in size. - Mitral valve: Trivial regurgitation. - Left atrium: The atrium was mildly dilated. - Right ventricle: Systolic function was normal. - Tricuspid valve: Mild regurgitation. - Pulmonary arteries: Systolic pressure was within the normal range.  Doppler legs negative  EKG: NSR with nonspecific changes  Microbiology: No results found for this or any previous visit (from the past 240 hour(s)).   Labs: Basic Metabolic Panel:  Recent Labs Lab 10/16/13 0320 10/17/13 0743 10/18/13 0420 10/19/13 0516 10/20/13 0455  NA 140 142 142 147 144  K 4.1 4.3 3.9 4.6 4.3  CL 107 108 108 111 108  CO2 22 20 23 28 25   GLUCOSE 99 93 92 91 98  BUN 8 7 5* 5* 6   CREATININE 0.61 0.56 0.56 0.64 0.66  CALCIUM 8.6 8.8 8.5 8.8 8.9   Liver Function Tests: No results found for this basename: AST, ALT, ALKPHOS, BILITOT, PROT, ALBUMIN,  in the last 168 hours No results found for this basename: LIPASE, AMYLASE,  in the last 168 hours No results found for this basename: AMMONIA,  in the last 168 hours CBC:  Recent Labs Lab 10/16/13 0320 10/17/13 0743 10/18/13 0420 10/19/13 0516 10/20/13 0455  WBC 7.1 6.9 6.5 6.1 6.9  HGB 8.9* 9.8* 8.8* 9.3* 9.4*  HCT 27.4* 30.8* 27.5* 28.7* 28.8*  MCV 88.4 90.1 87.9 88.6 88.6  PLT 292 310 344 377 389   Cardiac Enzymes: No results found for this basename: CKTOTAL, CKMB, CKMBINDEX, TROPONINI,  in the last 168 hours BNP: BNP (last 3 results) No results found for this basename: PROBNP,  in the last 8760 hours CBG: No results found for this basename: GLUCAP,  in the last 168 hours     Signed:  Delfina Redwood, MD [facility] 10/20/2013, 1:55 PM

## 2013-10-20 NOTE — Progress Notes (Signed)
DC home with belongings and accompained by her friend. Reviewed discharge instructions per teachback with patient. Prescriptions faxed to [address] [facility]

## 2013-10-21 DIAGNOSIS — M76891 Other specified enthesopathies of right lower limb, excluding foot: Secondary | ICD-10-CM | POA: Insufficient documentation

## 2013-10-21 DIAGNOSIS — M25559 Pain in unspecified hip: Secondary | ICD-10-CM | POA: Diagnosis not present

## 2013-11-02 ENCOUNTER — Telehealth: Payer: Self-pay | Admitting: Family Medicine

## 2013-11-02 NOTE — Telephone Encounter (Signed)
Patient advised to schedule a follow up hospital appointment.

## 2013-11-02 NOTE — Telephone Encounter (Signed)
Attempted to call patient but no answer.

## 2013-11-02 NOTE — Telephone Encounter (Signed)
Pt called want you to call her has a question about her discharge from the hospital

## 2013-11-06 ENCOUNTER — Other Ambulatory Visit: Payer: Self-pay | Admitting: Family Medicine

## 2013-11-11 ENCOUNTER — Telehealth: Payer: Self-pay | Admitting: *Deleted

## 2013-11-11 ENCOUNTER — Encounter: Payer: Self-pay | Admitting: Family Medicine

## 2013-11-11 ENCOUNTER — Ambulatory Visit (INDEPENDENT_AMBULATORY_CARE_PROVIDER_SITE_OTHER): Payer: Medicare Other | Admitting: Family Medicine

## 2013-11-11 VITALS — BP 140/80 | HR 84 | Temp 98.0°F

## 2013-11-11 DIAGNOSIS — I2699 Other pulmonary embolism without acute cor pulmonale: Secondary | ICD-10-CM | POA: Diagnosis not present

## 2013-11-11 DIAGNOSIS — D62 Acute posthemorrhagic anemia: Secondary | ICD-10-CM | POA: Diagnosis not present

## 2013-11-11 LAB — CBC WITH DIFFERENTIAL/PLATELET
BASOS ABS: 0 10*3/uL (ref 0.0–0.1)
Basophils Relative: 0.7 % (ref 0.0–3.0)
EOS PCT: 5.7 % — AB (ref 0.0–5.0)
Eosinophils Absolute: 0.4 10*3/uL (ref 0.0–0.7)
HCT: 35.3 % — ABNORMAL LOW (ref 36.0–46.0)
Hemoglobin: 11.4 g/dL — ABNORMAL LOW (ref 12.0–15.0)
LYMPHS ABS: 2.2 10*3/uL (ref 0.7–4.0)
Lymphocytes Relative: 32.4 % (ref 12.0–46.0)
MCHC: 32.4 g/dL (ref 30.0–36.0)
MCV: 84.7 fl (ref 78.0–100.0)
MONO ABS: 0.6 10*3/uL (ref 0.1–1.0)
Monocytes Relative: 9 % (ref 3.0–12.0)
NEUTROS PCT: 52.2 % (ref 43.0–77.0)
Neutro Abs: 3.5 10*3/uL (ref 1.4–7.7)
PLATELETS: 376 10*3/uL (ref 150.0–400.0)
RBC: 4.17 Mil/uL (ref 3.87–5.11)
RDW: 15.5 % (ref 11.5–15.5)
WBC: 6.7 10*3/uL (ref 4.0–10.5)

## 2013-11-11 LAB — IRON: IRON: 44 ug/dL (ref 42–145)

## 2013-11-11 LAB — FERRITIN: Ferritin: 23.9 ng/mL (ref 10.0–291.0)

## 2013-11-11 MED ORDER — HYDROCODONE-HOMATROPINE 5-1.5 MG/5ML PO SYRP: ORAL_SOLUTION | ORAL | Status: DC

## 2013-11-11 NOTE — Progress Notes (Signed)
Pre visit review using our clinic review tool, if applicable. No additional management support is needed unless otherwise documented below in the visit note. 

## 2013-11-11 NOTE — Progress Notes (Signed)
   Subjective:    Patient ID: Felicia Cruz, female    DOB: May 16, 1941, 73 y.o.   MRN: [id number]  HPI  Felicia Cruz is a 73 year old widowed female nonsmoker who comes in today for followup of a pulmonary embolus  She had surgery at [facility] 6 weeks ago on her right hip. 2 weeks postop she developed acute shortness of breath and was admitted to the hospital here on May 14 was in acute pulmonary embolus. She was discharged on May 20. No complications. She was sent home on xarelto .  She states she feels well but she feels weak and tired no energy. Her hemoglobin when she was hospitalized was 8.9. She was not given a supplement of oral iron  I had a long discussion with her about the pros and cons of oral anticoagulation with new medications as opposed to Coumadin. She [place] and does not want to go back-and-forth for protimes. She understands the risk of her current medication is bleeding which cannot be reversed.  Review of Systems    review of systems otherwise negative Objective:   Physical Exam  Well-developed well-nourished female no acute distress vital signs stable she is afebrile she does have a brace on her right leg secondary to the surgery.      Assessment & Plan:  Status post hip surgery 6 weeks ago,,,,,,, pulmonary embolus 2 weeks postop,,,,, now asymptomatic continue current medication however I advised her to switch to Coumadin which she declines at this juncture.  Anemia recheck labs

## 2013-11-11 NOTE — Telephone Encounter (Signed)
Patient is taking Xarelto 20 mg.  She would like to come off of the Xarelto and try either Plavix or Pradaxa?

## 2013-11-11 NOTE — Patient Instructions (Signed)
Recheck blood count today  Continue your current medications  Followup when necessary

## 2013-11-12 NOTE — Telephone Encounter (Signed)
Per Dr Sherren Mocha, patient should stay on Xarelto until she can come in for coumadin therapy.  Plavix is not for PE and Pradaxa is the same thing.   Also her Hgb is a little low so she should take one iron tab at bedtime for 2 months and have it rechecked. Left message on machine for patient to return our call.

## 2013-11-12 NOTE — Telephone Encounter (Signed)
Patient is aware 

## 2013-11-17 ENCOUNTER — Ambulatory Visit: Payer: Medicare Other | Attending: Orthopedic Surgery | Admitting: Physical Therapy

## 2013-11-17 DIAGNOSIS — M25659 Stiffness of unspecified hip, not elsewhere classified: Secondary | ICD-10-CM | POA: Insufficient documentation

## 2013-11-17 DIAGNOSIS — R262 Difficulty in walking, not elsewhere classified: Secondary | ICD-10-CM | POA: Diagnosis not present

## 2013-11-17 DIAGNOSIS — IMO0001 Reserved for inherently not codable concepts without codable children: Secondary | ICD-10-CM | POA: Insufficient documentation

## 2013-11-17 DIAGNOSIS — M25559 Pain in unspecified hip: Secondary | ICD-10-CM | POA: Insufficient documentation

## 2013-11-22 ENCOUNTER — Telehealth: Payer: Self-pay | Admitting: Family Medicine

## 2013-11-22 DIAGNOSIS — M25559 Pain in unspecified hip: Secondary | ICD-10-CM | POA: Diagnosis not present

## 2013-11-22 NOTE — Telephone Encounter (Signed)
Pt would like for you to call her about her Coumadin. She states she is on one medication and Dr. Sherren Mocha wants her to take something else but she would like to talk to you and give more information. She will be at the number provided until Wednesday.

## 2013-11-22 NOTE — Telephone Encounter (Signed)
Patient would like to know if she can purchase a Coaguc[place]k and check her own PT/INR.  Medicare will pay for it and someone will come and train her how to use it? Her legs and feet are starting to swell  Please advise.

## 2013-11-23 ENCOUNTER — Ambulatory Visit: Payer: Medicare Other | Admitting: Physical Therapy

## 2013-11-24 ENCOUNTER — Ambulatory Visit: Payer: Medicare Other | Admitting: Physical Therapy

## 2013-11-25 DIAGNOSIS — M25559 Pain in unspecified hip: Secondary | ICD-10-CM | POA: Diagnosis not present

## 2013-11-26 DIAGNOSIS — M25559 Pain in unspecified hip: Secondary | ICD-10-CM | POA: Diagnosis not present

## 2013-11-26 NOTE — Telephone Encounter (Signed)
Patient is aware of staying on Xeralto until she starts coumadin.  She would like to know what to do about her leg and feet swelling.  She will be out of town until August.

## 2013-11-29 MED ORDER — HYDROCHLOROTHIAZIDE 12.5 MG PO TABS: 12.5000 mg | ORAL_TABLET | Freq: Every day | ORAL | Status: DC

## 2013-11-29 NOTE — Telephone Encounter (Signed)
HCTZ 12.5 qd per Dr Sherren Mocha patient aware.

## 2013-12-01 DIAGNOSIS — M25559 Pain in unspecified hip: Secondary | ICD-10-CM | POA: Diagnosis not present

## 2013-12-03 DIAGNOSIS — M25559 Pain in unspecified hip: Secondary | ICD-10-CM | POA: Diagnosis not present

## 2013-12-06 ENCOUNTER — Telehealth: Payer: Self-pay | Admitting: Family Medicine

## 2013-12-06 MED ORDER — RIVAROXABAN 20 MG PO TABS: 20.0000 mg | ORAL_TABLET | Freq: Every day | ORAL | Status: DC

## 2013-12-06 NOTE — Telephone Encounter (Signed)
Spoke to pt told her Rx sent to pharmacy. Pt verbalized understanding.

## 2013-12-06 NOTE — Telephone Encounter (Signed)
Pt needs new rx xarelto 20 mg#30 call into [facility] [phone]. Pt had PE. PT has only one pill left. Pt is aware md out of office until 12-09-13

## 2013-12-10 DIAGNOSIS — M25559 Pain in unspecified hip: Secondary | ICD-10-CM | POA: Diagnosis not present

## 2013-12-13 DIAGNOSIS — M25559 Pain in unspecified hip: Secondary | ICD-10-CM | POA: Diagnosis not present

## 2013-12-15 DIAGNOSIS — M25559 Pain in unspecified hip: Secondary | ICD-10-CM | POA: Diagnosis not present

## 2013-12-22 DIAGNOSIS — M25559 Pain in unspecified hip: Secondary | ICD-10-CM | POA: Diagnosis not present

## 2013-12-24 DIAGNOSIS — M25559 Pain in unspecified hip: Secondary | ICD-10-CM | POA: Diagnosis not present

## 2013-12-29 DIAGNOSIS — M25559 Pain in unspecified hip: Secondary | ICD-10-CM | POA: Diagnosis not present

## 2014-01-12 DIAGNOSIS — M25559 Pain in unspecified hip: Secondary | ICD-10-CM | POA: Diagnosis not present

## 2014-01-14 DIAGNOSIS — M25559 Pain in unspecified hip: Secondary | ICD-10-CM | POA: Diagnosis not present

## 2014-01-24 DIAGNOSIS — M25559 Pain in unspecified hip: Secondary | ICD-10-CM | POA: Diagnosis not present

## 2014-01-27 DIAGNOSIS — M25559 Pain in unspecified hip: Secondary | ICD-10-CM | POA: Diagnosis not present

## 2014-02-03 ENCOUNTER — Encounter: Payer: Self-pay | Admitting: Internal Medicine

## 2014-02-08 ENCOUNTER — Other Ambulatory Visit: Payer: Self-pay | Admitting: Family Medicine

## 2014-02-17 DIAGNOSIS — M25559 Pain in unspecified hip: Secondary | ICD-10-CM | POA: Diagnosis not present

## 2014-02-21 DIAGNOSIS — L821 Other seborrheic keratosis: Secondary | ICD-10-CM | POA: Diagnosis not present

## 2014-02-21 DIAGNOSIS — D235 Other benign neoplasm of skin of trunk: Secondary | ICD-10-CM | POA: Diagnosis not present

## 2014-02-21 DIAGNOSIS — L570 Actinic keratosis: Secondary | ICD-10-CM | POA: Diagnosis not present

## 2014-02-21 DIAGNOSIS — H02729 Madarosis of unspecified eye, unspecified eyelid and periocular area: Secondary | ICD-10-CM | POA: Diagnosis not present

## 2014-02-21 DIAGNOSIS — Z85828 Personal history of other malignant neoplasm of skin: Secondary | ICD-10-CM | POA: Diagnosis not present

## 2014-03-08 ENCOUNTER — Encounter: Payer: Self-pay | Admitting: Internal Medicine

## 2014-03-09 ENCOUNTER — Telehealth: Payer: Self-pay | Admitting: Family Medicine

## 2014-03-09 NOTE — Telephone Encounter (Signed)
Left message on machine returning patient's call 

## 2014-03-09 NOTE — Telephone Encounter (Signed)
Pt would for you to call her to discuss her Lyrica medication.

## 2014-03-10 ENCOUNTER — Other Ambulatory Visit: Payer: Self-pay | Admitting: Family Medicine

## 2014-03-10 NOTE — Telephone Encounter (Signed)
Per JT, Send in 6 months.

## 2014-03-10 NOTE — Telephone Encounter (Signed)
Pt is calling rachel back. Pt is aware rachel is out of office until next week. Pt would like md to call her. Pt decline CAN

## 2014-03-10 NOTE — Telephone Encounter (Signed)
Spoke with pt and pt states she a reaction to the Lyrica.  Pt states she had chest pain, numbness, swelling in feet and hands, and water blisters on her face.  Pt states she stopped the medication 1 week ago and has has started feeling better.  Pt states her symptoms have improved since stopping the medicaiton.  Pt would like to know if there is another medicaiton she could switch to that would do the same thing. Rx can be sent to [facility] [address]. Pls advise.

## 2014-03-10 NOTE — Telephone Encounter (Signed)
Left a message for return call.  

## 2014-03-11 ENCOUNTER — Encounter: Payer: Self-pay | Admitting: *Deleted

## 2014-03-11 NOTE — Telephone Encounter (Signed)
Dr. Sherren Mocha to speak to the pt.

## 2014-03-16 ENCOUNTER — Ambulatory Visit (INDEPENDENT_AMBULATORY_CARE_PROVIDER_SITE_OTHER): Payer: Medicare Other

## 2014-03-16 ENCOUNTER — Other Ambulatory Visit: Payer: Self-pay | Admitting: Family Medicine

## 2014-03-16 DIAGNOSIS — Z23 Encounter for immunization: Secondary | ICD-10-CM | POA: Diagnosis not present

## 2014-03-16 DIAGNOSIS — R062 Wheezing: Secondary | ICD-10-CM

## 2014-03-16 NOTE — Telephone Encounter (Signed)
Dr Sherren Mocha does not prescribe Lyrica patient will have to call neurology. Left detailed message on machine

## 2014-03-17 ENCOUNTER — Institutional Professional Consult (permissible substitution): Payer: Medicare Other | Admitting: Pulmonary Disease

## 2014-03-30 ENCOUNTER — Encounter: Payer: Self-pay | Admitting: Pulmonary Disease

## 2014-03-30 ENCOUNTER — Ambulatory Visit (INDEPENDENT_AMBULATORY_CARE_PROVIDER_SITE_OTHER): Payer: Medicare Other | Admitting: Pulmonary Disease

## 2014-03-30 VITALS — BP 144/86 | HR 71 | Temp 97.0°F | Ht 66.0 in | Wt 191.0 lb

## 2014-03-30 DIAGNOSIS — R062 Wheezing: Secondary | ICD-10-CM | POA: Diagnosis not present

## 2014-03-30 NOTE — Progress Notes (Signed)
   Subjective:    Patient ID: Felicia Cruz, female    DOB: 1941/04/28, 73 y.o.   MRN: [id number]  HPI The patient comes in today for evaluation of wheezing. She has a history of cough and upper airway symptoms dating back to 2009, and underwent a methacholine challenge that is actually negative by today's criteria. She was initially felt to possibly have asthma, and then it was determined that she probably had reactive airways disease secondary to GERD. The patient states that she has a chronic globus sensation/fullness in her throat. Of note, the patient had recent hip surgery, and this was complicated by a pulmonary embolus in May. She has done well on chronic anticoagulation. The patient states since that time she has had audible wheezing that she suspects is coming from her upper airway. Her friends have heard this and made comments. She has chronic dyspnea on exertion, but admits that she is very deconditioned from her hospitalization. Denies any chest tightness or restriction to airflow, and has not used her albuterol in ages. She denies any postnasal drip or allergy symptoms, but does have occasional breakthrough reflux despite her medication. 6 is not a consistent issue. The patient has a history of significant GERD with esophageal stricture.   Review of Systems  Constitutional: Negative for fever and unexpected weight change.  HENT: Positive for trouble swallowing. Negative for congestion, dental problem, ear pain, nosebleeds, postnasal drip, rhinorrhea, sinus pressure, sneezing and sore throat.   Eyes: Negative for redness and itching.  Respiratory: Positive for cough, chest tightness, shortness of breath and wheezing.   Cardiovascular: Negative for palpitations and leg swelling.  Gastrointestinal: Negative for nausea and vomiting.  Genitourinary: Negative for dysuria.  Musculoskeletal: Negative for joint swelling.  Skin: Negative for rash.  Neurological: Negative for headaches.    Hematological: Does not bruise/bleed easily.  Psychiatric/Behavioral: Negative for dysphoric mood. The patient is not nervous/anxious.        Objective:   Physical Exam  Constitutional:  Overweight female, no acute distress  HENT:  Nares patent without discharge  Oropharynx without exudate, palate and uvula are normal  Eyes:  Perrla, eomi, no scleral icterus  Neck:  No JVD, no TMG  Cardiovascular:  Normal rate, regular rhythm, no rubs or gallops.  No murmurs        Intact distal pulses  Pulmonary :  Normal breath sounds, no stridor or respiratory distress   No rales, rhonchi, or wheezing  Abdominal:  Soft, nondistended, bowel sounds present.  No tenderness noted.   Musculoskeletal:  mild lower extremity edema noted.  Lymph Nodes:  No cervical lymphadenopathy noted  Skin:  No cyanosis noted  Neurologic:  Alert, appropriate, moves all 4 extremities without obvious deficit.         Assessment & Plan:

## 2014-03-30 NOTE — Patient Instructions (Signed)
Increase omeprazole to 40mg  in am and pm everyday until next visit. Get zyrtec 10mg  OTC and take one each night at bedtime until next visit. Will schedule for breathing studies, and see you back same day to review.

## 2014-03-30 NOTE — Assessment & Plan Note (Signed)
The patient has audible wheezing that I suspect is upper airway in origin rather than lower. There is nothing currently to suggest airways disease, but she certainly needs to have pulmonary function studies for evaluation. The common causes of upper airway wheezing include postnasal drip, reflux disease, and structural airway abnormalities. I would like to intensify her reflux regimen, and also get her to take an antihistamine at bedtime. Will schedule for pulmonary function studies for completeness, and see her back in a few weeks to check on things. If she continues to have issues, she may need an upper airway evaluation with otolaryngology, especially since this started after leaving the hospital in May.

## 2014-04-08 ENCOUNTER — Encounter (HOSPITAL_BASED_OUTPATIENT_CLINIC_OR_DEPARTMENT_OTHER): Payer: Self-pay | Admitting: *Deleted

## 2014-04-08 ENCOUNTER — Emergency Department (HOSPITAL_BASED_OUTPATIENT_CLINIC_OR_DEPARTMENT_OTHER)
Admission: EM | Admit: 2014-04-08 | Discharge: 2014-04-08 | Disposition: A | Discharge status: Home / Self Care | Payer: Medicare Other | Attending: Emergency Medicine | Admitting: Emergency Medicine

## 2014-04-08 DIAGNOSIS — H269 Unspecified cataract: Secondary | ICD-10-CM | POA: Insufficient documentation

## 2014-04-08 DIAGNOSIS — Z87891 Personal history of nicotine dependence: Secondary | ICD-10-CM | POA: Diagnosis not present

## 2014-04-08 DIAGNOSIS — Z8639 Personal history of other endocrine, nutritional and metabolic disease: Secondary | ICD-10-CM | POA: Insufficient documentation

## 2014-04-08 DIAGNOSIS — Y9289 Other specified places as the place of occurrence of the external cause: Secondary | ICD-10-CM | POA: Diagnosis not present

## 2014-04-08 DIAGNOSIS — Z7901 Long term (current) use of anticoagulants: Secondary | ICD-10-CM

## 2014-04-08 DIAGNOSIS — Z87442 Personal history of urinary calculi: Secondary | ICD-10-CM | POA: Diagnosis not present

## 2014-04-08 DIAGNOSIS — G2581 Restless legs syndrome: Secondary | ICD-10-CM | POA: Diagnosis not present

## 2014-04-08 DIAGNOSIS — W1841XA Slipping, tripping and stumbling without falling due to stepping on object, initial encounter: Secondary | ICD-10-CM | POA: Diagnosis not present

## 2014-04-08 DIAGNOSIS — Y9389 Activity, other specified: Secondary | ICD-10-CM | POA: Diagnosis not present

## 2014-04-08 DIAGNOSIS — Z91040 Latex allergy status: Secondary | ICD-10-CM | POA: Insufficient documentation

## 2014-04-08 DIAGNOSIS — K219 Gastro-esophageal reflux disease without esophagitis: Secondary | ICD-10-CM | POA: Insufficient documentation

## 2014-04-08 DIAGNOSIS — Z880 Allergy status to penicillin: Secondary | ICD-10-CM | POA: Diagnosis not present

## 2014-04-08 DIAGNOSIS — M1990 Unspecified osteoarthritis, unspecified site: Secondary | ICD-10-CM | POA: Insufficient documentation

## 2014-04-08 DIAGNOSIS — S81811A Laceration without foreign body, right lower leg, initial encounter: Secondary | ICD-10-CM | POA: Diagnosis not present

## 2014-04-08 NOTE — ED Provider Notes (Signed)
CSN: [id number]     Arrival date & time 04/08/14  1350 History   First MD Initiated Contact with Patient 04/08/14 1411     Chief Complaint  Patient presents with  . Leg Injury     (Consider location/radiation/quality/duration/timing/severity/associated sxs/prior Treatment) HPI This is a 73 year old female who scraped her right shin on a stepladder and she came down. She is on  xarelto she did not fall to the ground or injure any other part of her body. She denies any chest pain or shortness of breath. Her last tetanus was in January. He hadsome increased bleeding afterwards and came in secondary to that.to control his home. 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 RLS (restless legs syndrome)   . Esophageal stricture 2010  . Osteopenia   . Hyperlipemia   . Arthritis   . Kidney stones   . GERD (gastroesophageal reflux disease)   . Substance abuse     RECOVERING ALCOLHOLIC  . Alcoholism     HAS NOT HAD [place]  . Cataract   . 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 Hiatal hernia    Past Surgical History  Procedure Laterality Date  . Lumbar disc surgery  2008  . Facial cosmetic surgery      eyes, nose  . Abdominoplasty    . Liposuction  15 to 20 yrs ago  . Surgery for kidney stones    . Right index finger surgery  06/19/11    AT [facility]  . Laparoscopic nissen fundoplication  2/50/5397    Procedure: LAPAROSCOPIC NISSEN FUNDOPLICATION;  Surgeon: Pedro Earls, MD;  Location: [facility];  Service: General;  Laterality: N/A;  Laparoscopic Nissen repair of hiatal hernia  with lighted bougie  . Esophageal manometry  02/17/2012    Procedure: ESOPHAGEAL MANOMETRY (EM);  Surgeon: Pedro Earls, MD;  Location: [facility];  Service: Endoscopy;  Laterality: N/A;  . Dilation and curettage of uterus  many yrs ago    x 2  . Total knee arthroplasty Left 08/10/2012    Procedure: TOTAL KNEE ARTHROPLASTY;  Surgeon:  Mauri Pole, MD;  Location: [facility];  Service: Orthopedics;  Laterality: Left;  . Joint replacement      left  . Open surgical repair of gluteal tendon Right 12/28/2012    Procedure: RIGHT GLUTEAL TENDON REPAIR;  Surgeon: Mauri Pole, MD;  Location: [facility];  Service: Orthopedics;  Laterality: Right;   Family History  Problem Relation Age of Onset  . Throat cancer Father     Smoker  . Heart disease Mother   . Hypertension Mother   . Hip fracture Mother   . Alzheimer's disease Mother   . Stroke Mother   . Stroke Brother 50    smoker   History  Substance Use Topics  . Smoking status: Former Smoker -- 1.00 packs/day for 10 years    Types: Cigarettes    Quit date: 06/04/1971  . Smokeless tobacco: Never Used  . Alcohol Use: No     Comment: RECOVERING ALCOLHOLIC   OB History    No data available     Review of Systems  All other systems reviewed and are negative.     Allergies  Lipitor; Penicillins; and Latex  Home Medications   Prior to Admission medications   Medication Sig Start Date End Date Taking? Authorizing Provider  acetaminophen (TYLENOL) 325 MG tablet Take 650 mg by  mouth every 6 (six) hours as needed.    Historical Provider, MD  albuterol (VENTOLIN HFA) 108 (90 BASE) MCG/ACT inhaler Inhale 2 puffs into the lungs every 4 (four) hours as needed for wheezing or shortness of breath (or cough). 10/20/13   Delfina Redwood, MD  LORazepam (ATIVAN) 1 MG tablet TAKE 1 TABLET BY MOUTH EVERY EVENING 09/09/13   Dorena Cookey, MD  omeprazole (PRILOSEC) 20 MG capsule Take 20 mg by mouth 2 (two) times daily before a meal.     Historical Provider, MD  pramipexole (MIRAPEX) 1 MG tablet Take 1 mg by mouth at bedtime. 1 by mouth each bedtime 09/09/13   Dorena Cookey, MD  XARELTO 20 MG TABS tablet TAKE 1 TABLET BY MOUTH EVERY DAY WITH SUPPER 03/10/14   Dorena Cookey, MD   BP 154/77 mmHg  Pulse 82  Temp(Src) 97.9 F (36.6 C) (Oral)  Resp 20  Ht 5\' 6"  (1.676 m)  Wt 180 lb  (81.647 kg)  BMI 29.07 kg/m2  SpO2 97% Physical Exam  Constitutional: She appears well-developed and well-nourished.  HENT:  Head: Normocephalic and atraumatic.  Eyes: Pupils are equal, round, and reactive to light.  Neck: Normal range of motion.  Musculoskeletal:       Legs: 3 cm skin tear right lower extremity. Bleeding controlled. Dorsal pedalis pulses are 2+. Sensation is intact in the foot. There is no bony tenderness over the lower leg, ankle, knee, or foot.  Nursing note and vitals reviewed.   ED Course  Procedures (including critical care time) Labs Review Labs Reviewed - No data to display  Imaging Review No results found.   EKG Interpretation None      MDM   Final diagnoses:  Noninfected skin tear of right lower extremity, initial encounter  Anticoagulant long-term use   Plan wound care here. We'll place a non-adhesive dressing and direct pressure on top. I've instructed her regarding wound care. I've instructed her to elevate over the next 24 hours until there is eschar formation.   Shaune Pollack, MD 04/08/14 [phone]

## 2014-04-08 NOTE — Discharge Instructions (Signed)
Skin Tear Care °A skin tear is when the top layer of skin peels off. To repair the skin, your doctor may use:  °· Tape. °· Skin adhesive strips. °HOME CARE °· Change bandages (dressings) once a day or as told by your doctor. °¨ Gently clean the area with salt (saline) solution or with a mild soap and water. °¨ Do not rub the injured skin dry. Let the area air dry. °¨ Put petroleum jelly or antibiotic cream on the tear. Do not allow a scab to form. °¨ If the bandage sticks, moisten it with warm soapy water and remove it. °· Protect the injured skin until it has healed. °· Only take medicine as told by your doctor. °· Take showers or baths using warm soapy water. Apply a new bandage after the shower or bath. °· Keep all doctor visits as told. °GET HELP RIGHT AWAY IF:  °· You have redness, puffiness (swelling), or more pain in the tear. °· You have a yellowish-white fluid (pus) coming from the tear. °· You have chills. °· You have a red streak that goes away from the tear. °· You have a bad smell coming from the tear or bandage. °· You have a fever or lasting symptoms for more than 2-3 days. °· You have a fever and your symptoms suddenly get worse. °MAKE SURE YOU:  °· Understand these instructions. °· Will watch this condition. °· Will get help right away if you are not doing well or get worse. °Document Released: 02/27/2008 Document Revised: 02/12/2012 Document Reviewed: 12/02/2011 °ExitCare® Patient Information ©2015 [employer]. This information is not intended to replace advice given to you by your health care provider. Make sure you discuss any questions you have with your health care provider. ° °

## 2014-04-08 NOTE — ED Notes (Signed)
Stepped off a ladder and missed a step. Abrasion to her right lower leg. She takes a blood thinner and had a hard time getting the bleeding controlled.

## 2014-04-11 ENCOUNTER — Other Ambulatory Visit: Payer: Self-pay

## 2014-04-11 MED ORDER — ALBUTEROL SULFATE HFA 108 (90 BASE) MCG/ACT IN AERS: 2.0000 | INHALATION_SPRAY | RESPIRATORY_TRACT | Status: DC | PRN

## 2014-04-15 ENCOUNTER — Encounter: Payer: Self-pay | Admitting: Pulmonary Disease

## 2014-04-15 ENCOUNTER — Ambulatory Visit (HOSPITAL_COMMUNITY)
Admission: RE | Admit: 2014-04-15 | Discharge: 2014-04-15 | Disposition: A | Discharge status: Home / Self Care | Payer: Medicare Other | Source: Ambulatory Visit | Attending: Pulmonary Disease | Admitting: Pulmonary Disease

## 2014-04-15 ENCOUNTER — Ambulatory Visit (INDEPENDENT_AMBULATORY_CARE_PROVIDER_SITE_OTHER): Payer: Medicare Other | Admitting: Pulmonary Disease

## 2014-04-15 DIAGNOSIS — R05 Cough: Secondary | ICD-10-CM | POA: Diagnosis not present

## 2014-04-15 DIAGNOSIS — R0602 Shortness of breath: Secondary | ICD-10-CM | POA: Diagnosis not present

## 2014-04-15 DIAGNOSIS — R062 Wheezing: Secondary | ICD-10-CM | POA: Diagnosis not present

## 2014-04-15 DIAGNOSIS — F17210 Nicotine dependence, cigarettes, uncomplicated: Secondary | ICD-10-CM | POA: Insufficient documentation

## 2014-04-15 DIAGNOSIS — Z79899 Other long term (current) drug therapy: Secondary | ICD-10-CM | POA: Diagnosis not present

## 2014-04-15 LAB — PULMONARY FUNCTION TEST
DL/VA % PRED: 84 %
DL/VA: 4.14 ml/min/mmHg/L
DLCO UNC: 19.61 ml/min/mmHg
DLCO unc % pred: 76 %
FEF 25-75 Post: 2.48 L/sec
FEF 25-75 Pre: 2 L/sec
FEF2575-%Change-Post: 24 %
FEF2575-%PRED-PRE: 110 %
FEF2575-%Pred-Post: 136 %
FEV1-%CHANGE-POST: 5 %
FEV1-%PRED-POST: 102 %
FEV1-%PRED-PRE: 97 %
FEV1-POST: 2.31 L
FEV1-PRE: 2.19 L
FEV1FVC-%CHANGE-POST: 4 %
FEV1FVC-%PRED-PRE: 104 %
FEV6-%Change-Post: 1 %
FEV6-%PRED-POST: 98 %
FEV6-%Pred-Pre: 97 %
FEV6-PRE: 2.79 L
FEV6-Post: 2.82 L
FEV6FVC-%Pred-Post: 105 %
FEV6FVC-%Pred-Pre: 105 %
FVC-%Change-Post: 1 %
FVC-%Pred-Post: 93 %
FVC-%Pred-Pre: 93 %
FVC-Post: 2.82 L
FVC-Pre: 2.79 L
POST FEV6/FVC RATIO: 100 %
PRE FEV6/FVC RATIO: 100 %
Post FEV1/FVC ratio: 82 %
Pre FEV1/FVC ratio: 79 %
RV % PRED: 115 %
RV: 2.65 L
TLC % PRED: 109 %
TLC: 5.68 L

## 2014-04-15 MED ORDER — ALBUTEROL SULFATE (2.5 MG/3ML) 0.083% IN NEBU
2.5000 mg | INHALATION_SOLUTION | Freq: Once | RESPIRATORY_TRACT | Status: AC
  Administered 2014-04-15: 2.5 mg via RESPIRATORY_TRACT

## 2014-04-15 NOTE — Assessment & Plan Note (Signed)
The patient's PFTs today are totally normal, and therefore I think it is unlikely that her wheezing is coming from her lower airway. She has seen some improvement with more aggressive treatment of reflux, and I've asked her to continue on this. I do think she needs to have an upper airway evaluation by otolaryngology to make sure there are no anatomical abnormalities.

## 2014-04-15 NOTE — Patient Instructions (Signed)
Will refer to ENT doctor to look at your voice box. Stay on reflux medication since you feel this has helped. I think you can come off xarelto at the 36mos mark, and will send this recommendation to your primary md.  followup with me as needed.

## 2014-04-15 NOTE — Progress Notes (Signed)
   Subjective:    Patient ID: Felicia Cruz, female    DOB: January 07, 1941, 73 y.o.   MRN: [id number]  HPI Patient comes in today for follow-up of her recent pulmonary function studies, done as part of a workup for her complaint of wheezing. She had no airflow obstruction, no restriction, and an essentially normal DLCO. I felt the last visit that her wheezing was upper airway in origin, and did intensify her acid reflux regimen. She thinks this has helped. I have reviewed the studies with her in detail, and answered all of her questions.   Review of Systems  Constitutional: Negative for fever and unexpected weight change.  HENT: Positive for congestion and postnasal drip. Negative for dental problem, ear pain, nosebleeds, rhinorrhea, sinus pressure, sneezing, sore throat and trouble swallowing.   Eyes: Negative for redness and itching.  Respiratory: Positive for wheezing. Negative for cough, chest tightness and shortness of breath.   Cardiovascular: Negative for palpitations and leg swelling.  Gastrointestinal: Negative for nausea and vomiting.  Genitourinary: Negative for dysuria.  Musculoskeletal: Negative for joint swelling.  Skin: Negative for rash.  Neurological: Negative for headaches.  Hematological: Does not bruise/bleed easily.  Psychiatric/Behavioral: Negative for dysphoric mood. The patient is not nervous/anxious.        Objective:   Physical Exam Obese female in no acute distress Nose without purulence or discharge noted Neck without lymphadenopathy or thyromegaly Lower extremities with mild edema, no cyanosis Alert and oriented, moves all 4 extremities.       Assessment & Plan:

## 2014-04-26 ENCOUNTER — Emergency Department (HOSPITAL_COMMUNITY)
Admission: EM | Admit: 2014-04-26 | Discharge: 2014-04-26 | Disposition: A | Discharge status: Home / Self Care | Payer: Medicare Other | Attending: Emergency Medicine | Admitting: Emergency Medicine

## 2014-04-26 ENCOUNTER — Emergency Department (HOSPITAL_COMMUNITY): Payer: Medicare Other

## 2014-04-26 ENCOUNTER — Encounter (HOSPITAL_COMMUNITY): Payer: Self-pay | Admitting: *Deleted

## 2014-04-26 DIAGNOSIS — Z87891 Personal history of nicotine dependence: Secondary | ICD-10-CM | POA: Diagnosis not present

## 2014-04-26 DIAGNOSIS — R934 Abnormal findings on diagnostic imaging of urinary organs: Secondary | ICD-10-CM | POA: Insufficient documentation

## 2014-04-26 DIAGNOSIS — H269 Unspecified cataract: Secondary | ICD-10-CM | POA: Insufficient documentation

## 2014-04-26 DIAGNOSIS — D1771 Benign lipomatous neoplasm of kidney: Secondary | ICD-10-CM | POA: Diagnosis not present

## 2014-04-26 DIAGNOSIS — G8929 Other chronic pain: Secondary | ICD-10-CM | POA: Insufficient documentation

## 2014-04-26 DIAGNOSIS — M545 Low back pain, unspecified: Secondary | ICD-10-CM

## 2014-04-26 DIAGNOSIS — R300 Dysuria: Secondary | ICD-10-CM | POA: Diagnosis present

## 2014-04-26 DIAGNOSIS — R9389 Abnormal findings on diagnostic imaging of other specified body structures: Secondary | ICD-10-CM

## 2014-04-26 DIAGNOSIS — Z91040 Latex allergy status: Secondary | ICD-10-CM | POA: Diagnosis not present

## 2014-04-26 DIAGNOSIS — K219 Gastro-esophageal reflux disease without esophagitis: Secondary | ICD-10-CM | POA: Insufficient documentation

## 2014-04-26 DIAGNOSIS — Z9889 Other specified postprocedural states: Secondary | ICD-10-CM | POA: Insufficient documentation

## 2014-04-26 DIAGNOSIS — M1990 Unspecified osteoarthritis, unspecified site: Secondary | ICD-10-CM | POA: Insufficient documentation

## 2014-04-26 DIAGNOSIS — Z880 Allergy status to penicillin: Secondary | ICD-10-CM | POA: Diagnosis not present

## 2014-04-26 DIAGNOSIS — G2581 Restless legs syndrome: Secondary | ICD-10-CM | POA: Insufficient documentation

## 2014-04-26 DIAGNOSIS — Z79899 Other long term (current) drug therapy: Secondary | ICD-10-CM | POA: Insufficient documentation

## 2014-04-26 DIAGNOSIS — I722 Aneurysm of renal artery: Secondary | ICD-10-CM | POA: Diagnosis not present

## 2014-04-26 DIAGNOSIS — R109 Unspecified abdominal pain: Secondary | ICD-10-CM

## 2014-04-26 DIAGNOSIS — Z87442 Personal history of urinary calculi: Secondary | ICD-10-CM | POA: Insufficient documentation

## 2014-04-26 DIAGNOSIS — Z8639 Personal history of other endocrine, nutritional and metabolic disease: Secondary | ICD-10-CM | POA: Insufficient documentation

## 2014-04-26 DIAGNOSIS — M549 Dorsalgia, unspecified: Secondary | ICD-10-CM | POA: Diagnosis not present

## 2014-04-26 HISTORY — DX: Other intervertebral disc degeneration, lumbar region: M51.36

## 2014-04-26 HISTORY — DX: Other intervertebral disc degeneration, lumbar region without mention of lumbar back pain or lower extremity pain: M51.369

## 2014-04-26 HISTORY — DX: Other chronic pain: G89.29

## 2014-04-26 HISTORY — DX: Dorsalgia, unspecified: M54.9

## 2014-04-26 LAB — URINALYSIS, ROUTINE W REFLEX MICROSCOPIC
Bilirubin Urine: NEGATIVE
Glucose, UA: NEGATIVE mg/dL
Hgb urine dipstick: NEGATIVE
Ketones, ur: NEGATIVE mg/dL
LEUKOCYTES UA: NEGATIVE
Nitrite: NEGATIVE
PROTEIN: NEGATIVE mg/dL
Specific Gravity, Urine: 1.014 (ref 1.005–1.030)
UROBILINOGEN UA: 0.2 mg/dL (ref 0.0–1.0)
pH: 7 (ref 5.0–8.0)

## 2014-04-26 LAB — I-STAT CHEM 8, ED
BUN: 9 mg/dL (ref 6–23)
CREATININE: 0.5 mg/dL (ref 0.50–1.10)
Calcium, Ion: 1.18 mmol/L (ref 1.13–1.30)
Chloride: 105 mEq/L (ref 96–112)
Glucose, Bld: 110 mg/dL — ABNORMAL HIGH (ref 70–99)
HCT: 38 % (ref 36.0–46.0)
HEMOGLOBIN: 12.9 g/dL (ref 12.0–15.0)
Potassium: 3.8 mEq/L (ref 3.7–5.3)
Sodium: 139 mEq/L (ref 137–147)
TCO2: 21 mmol/L (ref 0–100)

## 2014-04-26 MED ORDER — OXYCODONE-ACETAMINOPHEN 5-325 MG PO TABS
1.0000 | ORAL_TABLET | Freq: Once | ORAL | Status: AC
  Administered 2014-04-26: 1 via ORAL
  Filled 2014-04-26: qty 1

## 2014-04-26 MED ORDER — ONDANSETRON 8 MG PO TBDP
8.0000 mg | ORAL_TABLET | Freq: Once | ORAL | Status: AC
  Administered 2014-04-26: 8 mg via ORAL
  Filled 2014-04-26: qty 1

## 2014-04-26 MED ORDER — OXYCODONE-ACETAMINOPHEN 5-325 MG PO TABS: ORAL_TABLET | ORAL | Status: DC

## 2014-04-26 MED ORDER — METHOCARBAMOL 500 MG PO TABS: 500.0000 mg | ORAL_TABLET | Freq: Two times a day (BID) | ORAL | Status: DC | PRN

## 2014-04-26 MED ORDER — IOHEXOL 350 MG/ML SOLN
100.0000 mL | Freq: Once | INTRAVENOUS | Status: AC | PRN
  Administered 2014-04-26: 100 mL via INTRAVENOUS

## 2014-04-26 MED ORDER — MORPHINE SULFATE 4 MG/ML IJ SOLN
4.0000 mg | INTRAMUSCULAR | Status: AC | PRN
  Administered 2014-04-26 (×2): 4 mg via INTRAVENOUS
  Filled 2014-04-26 (×2): qty 1

## 2014-04-26 NOTE — ED Notes (Signed)
Bed: [id number] Expected date:  Expected time:  Means of arrival:  Comments: EMS-back pain

## 2014-04-26 NOTE — ED Notes (Signed)
Per EMS: pt coming from home, c/o bilateral flank pain and difficulty urinating, hx of kidney stones five years ago. Denies fever, nausea and vomiting.

## 2014-04-26 NOTE — Discharge Instructions (Signed)
°Emergency Department Resource Guide °1) Find a Doctor and Pay Out of Pocket °Although you won't have to find out who is covered by your insurance plan, it is a good idea to ask around and get recommendations. You will then need to call the office and see if the doctor you have chosen will accept you as a new patient and what types of options they offer for patients who are self-pay. Some doctors offer discounts or will set up payment plans for their patients who do not have insurance, but you will need to ask so you aren't surprised when you get to your appointment. ° °2) Contact Your Local Health Department °Not all health departments have doctors that can see patients for sick visits, but many do, so it is worth a call to see if yours does. If you don't know where your local health department is, you can check in your phone book. The CDC also has a tool to help you locate your state's health department, and many state websites also have listings of all of their local health departments. ° °3) Find a Walk-in Clinic °If your illness is not likely to be very severe or complicated, you may want to try a walk in clinic. These are popping up all over the country in pharmacies, drugstores, and shopping centers. They're usually staffed by nurse practitioners or physician assistants that have been trained to treat common illnesses and complaints. They're usually fairly quick and inexpensive. However, if you have serious medical issues or chronic medical problems, these are probably not your best option. ° °No Primary Care Doctor: °- Call Health Connect at  832-8000 - they can help you locate a primary care doctor that  accepts your insurance, provides certain services, etc. °- Physician Referral Service- 1-800-533-3463 ° °Chronic Pain Problems: °Organization         Address  Phone   Notes  °[facility]  (336) 297-2271 Patients need to be referred by their primary care doctor.  ° °Medication  Assistance: °Organization         Address  Phone   Notes  °[place] [facility] [address] °[place], [place] [address] (336) 641-8030 --Must be a resident of [place] °-- Must have NO insurance coverage whatsoever (no Medicaid/ Medicare, etc.) °-- The pt. MUST have a primary care doctor that directs their care regularly and follows them in the community °  °[facility]  (866) 331-1348   °[employer]  (888) 892-1162   ° °Agencies that provide inexpensive medical care: °Organization         Address  Phone   Notes  °[facility]  (336) 832-8035   °[facility]    (336) 832-7272   °[facility] [address] °[place], [place] [address] (336) 832-4777   °[facility] [address], °[place] (336) 271-4999   °[facility]    (336) 373-0678   °[facility]    (336) 272-1050   °[facility] ° [address], [place] Phone:  (336) 832-4444, Fax:  (336) 832-4440 Hours of Operation:  9 am - 6 pm, M-F.  Also accepts Medicaid/Medicare and self-pay.  °[facility] ° [address], [place] Phone: (336) 832-3150, Fax: (336) 832-3151. Hours of Operation:  8:30 am - 5:30 pm, M-F.  Also accepts Medicaid and self-pay.  °[facility] [place] [address], [place] Phone: (336) 878-6027   °[facility] [address], [place], [place] (336)723-1848, Ext. 123 Mondays & Thursdays: 7-9 AM.  First 15 patients are seen on a first come, first serve basis. °  ° °Medicaid-accepting [place] Providers: ° °Organization         Address  Phone   Notes  °[facility] [address], [place] (336) 641-2100 Also accepts self-pay patients.  °[facility] [address], [place] ° (336) 856-9996   °[facility] [address], [place]  (336) 288-8857   °[facility] [address] [place] [address], [place] (336) 299-7000   °Veita Bland [address], [place]  ° (336) 373-1557 Only accepts [place] Access Medicaid patients after they have their name applied to their card.  ° °Self-Pay (no insurance) in [place]: ° °Organization         Address  Phone   Notes  °Sickle Cell Patients, [facility] [address], [place] (336) 832-1970   °[facility] [address], [place] (336) 832-4400   °[facility] ° [address], [place] (336) 992-4800   °[facility]/Dr. Osei-Bonsu ° [address] [place] [address], [place] or [address], [place] (336) [phone] Phone number for both [place] and [place] locations is the same.  °[facility] [address], [place] (336) 299-0000   °[facility] [address] [place] [address], [place] or [address] (336) 852-7530 °(336) 878-2260   °[facility] [address], [place] (336) 350-1642, phone; (336) 294-5005, fax Sees patients 1st and 3rd Saturday of every month.  Must not qualify for public or private insurance (i.e. Medicaid, Medicare, [place] Health Choice, Veterans' Benefits) • Household income should be no more than 200% of the poverty level •The clinic cannot treat you if you are pregnant or think you are pregnant • Sexually transmitted diseases are not treated at the clinic.  ° ° °Dental Care: °Organization         Address  Phone  Notes  °[place] [facility] [address], [place] (336) 641-6152 Accepts children up to age 21 who are enrolled in Medicaid or [place] Health Choice; pregnant women with a Medicaid card; and children who have applied for Medicaid or [place] Health Choice, but were declined, whose parents can pay a reduced fee at time of service.  °[place]  [facility] [place]  [address], [place] (336) 641-7733 Accepts children up to age 21 who are enrolled in Medicaid or [place] Health Choice; pregnant women with a Medicaid card; and children who have applied for Medicaid or  Health Choice, but were declined, whose parents can pay a reduced fee at time of service.  °[facility] ° [address],  (336) 641-4533 Patients are seen by appointment only. Walk-ins are not accepted. [facility] will see patients 18 years of age and older. °Monday - Tuesday (8am-5pm) °Most Wednesdays (8:30-5pm) °$30 per visit, cash only  °[facility] ° [address], [place] (336) 641-4533 Patients are seen by appointment only. Walk-ins are not accepted. [facility] will see patients 18 years of age and older. °One   Wednesday Evening (Monthly: Volunteer Based).  $30 per visit, cash only  °[facility]  (919) 537-3737 for adults; Children under age 4, call Graduate Pediatric Dentistry at (919) [phone]. Children aged 4-14, please call (919) [phone] to request a pediatric application. ° Dental services are provided in all areas of dental care including fillings, crowns and bridges, complete and partial dentures, implants, gum treatment, root canals, and extractions. Preventive care is also provided. Treatment is provided to both adults and children. °Patients are selected via a lottery and there is often a waiting list. °  °[facility] [address], °[place] ° (336) 763-8833 www.drcivils.com °  °[facility] [address], [place], [place] (336)723-1848, Ext. 123 Second and Fourth Thursday of each month, opens at 6:30 AM; Clinic ends at 9 AM.  Patients are seen on a first-come first-served basis, and a limited number are seen during each clinic.  ° °[facility] ° [address], [place], [place] (336) 723-7904    Eligibility Requirements °You must have lived in [place], [place], or [place] counties for at least the last three months. °  You cannot be eligible for state or federal sponsored healthcare insurance, including Veterans Administration, Medicaid, or Medicare. °  You generally cannot be eligible for healthcare insurance through your employer.  °  How to apply: °Eligibility screenings are held every Tuesday and Wednesday afternoon from 1:00 pm until 4:00 pm. You do not need an appointment for the interview!  °[facility] [address], [place], [place] 336-631-2330   °[facility]  336-342-8273   °[facility]  336-703-3100   °[facility]  336-570-6415   ° °Behavioral Health Resources in the Community: °Intensive Outpatient Programs °Organization         Address  Phone  Notes  °[facility] [address], [place], [place] 336-878-6098   °[facility] [address], [place], [place] 336-832-9800   °[facility] [address], [place], [place] ° 336-882-2125   °[facility] [address],  °[place], [place] 1-800-853-5163 or 336-641-4981   °Substance Abuse Resources °Organization         Address  Phone  Notes  °[facility]  336-882-2125   °[facility]  336-784-9470   °[facility]  336-285-9073   °[facility]  336-845-3988   °Residential & Outpatient Substance Abuse Program  1-800-659-3381   °Psychological Services °Organization         Address  Phone  Notes  °[facility]  336- 832-9600   °[facility]  336- 378-7881   °[facility] [address], [place] 1-800-853-5163 or 336-641-4981   ° °Mobile Crisis Teams °Organization         Address  Phone  Notes  °[facility], Mobile Crisis Care Unit  1-877-626-1772   °Assertive °[facility] ° [address]  [place], [place] 336-834-9664   °Sharon DeEsch [address] °[place] 336-554-5454   ° °Self-Help/Support Groups °Organization         Address  Phone             Notes  °[facility]  - variety of support groups  336- 373-1402 Call for more information  °Narcotics Anonymous (NA), [facility] [address], °[place]   2 meetings at this location  ° °  Residential Treatment Programs Organization         Address  Phone  Notes  [facility] [address]    [place]  1-6695883644   [facility]  [address], [place] [address], [place], [place]   [place], [place] 306-858-6374 Admissions: 8am-3pm M-F  [facility] [place] [address],    [place], [place] 509-326-7124   [facility] [address] [place], [place], [place]   [facility] [address],  [place], [place]   [facility] Intensive Outpatient [address], [place], [place]   [facility] ([place]  [place] [employer], [place] 1-469-694-7323 or (304) 670-8052   [facility] [address] [place] [address] [place], [place] Accepts Medicaid  [facility] [address],  [place] 1-(972) 283-6042 Substance Abuse/Addiction Treatment   [facility]         Address  Phone  Notes  [facility]  (951)107-0044   Domenic Schwab, PhD [address] [place] [place], [place]   332-488-8357 or (830)018-0105   [facility], [place] 2138873377   [facility] [address] [place], [place] (432) 450-7561 Insurance/Medicaid/sponsorship through [facility] [address]                                    [place], [place] [phone] [place] [address], [place] (858)291-4342    Dr. Adele Schilder  (514)247-6356   [facility] Dept. 1) [address], [place] 2) [place] 3)  [address], [place] 503-647-3356 2530039637  928-344-3630   [place] 718-457-1656 or [phone] (After Hours)      Take the prescriptions as directed.  Apply moist heat or ice to the area(s) of discomfort, for 15 minutes at a time, several times per day for the next few days.  Do not fall asleep on a heating or ice pack. 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 This aneurysm roughly measures up to 2.2 cm and there has been minimal change since 2011. Recommend non emergent vascular surgery consultation for management and surveillance. In addition, there is a small aneurysm involving the left renal artery, measuring up to 1.3 cm."  Call the Vascular Surgeon Dr. Cathren Laine at [facility] today to schedule a follow up appointment within the next 1 to 2 weeks. Bring the disc of your radiology images (given to you today) to this office appointment. Call your regular medical doctor today to schedule a follow up appointment this week.  Return to the Emergency Department immediately if worsening.

## 2014-04-26 NOTE — ED Provider Notes (Signed)
CSN: [id number]     Arrival date & time 04/26/14  5974 History   First MD Initiated Contact with Patient 04/26/14 [phone]     Chief Complaint  Patient presents with  . Back Pain  . Dysuria      HPI Pt was seen at 0850. Per pt, c/o gradual onset and persistence of constant dysuria, urinary frequency and urgency for the past 1 week. Has been associated with left sided low back "pain" as well as nausea. Denies fevers, no abd pain, no vomiting/diarrhea, no vaginal bleeding/discharge, no hematuria, no rash.  Denies incont/retention of bowel or bladder, no saddle anesthesia, no focal motor weakness, no tingling/numbness in extremities.     Past Medical History  Diagnosis Date  . RLS (restless legs syndrome)   . Esophageal stricture 2010  . Osteopenia   . Hyperlipemia   . Arthritis   . Kidney stones   . GERD (gastroesophageal reflux disease)   . Substance abuse     RECOVERING ALCOLHOLIC  . Alcoholism     HAS NOT HAD [place]  . Cataract   . 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 Hiatal hernia   . DDD (degenerative disc disease), lumbar   . Chronic back pain    Past Surgical History  Procedure Laterality Date  . Lumbar disc surgery  2008  . Facial cosmetic surgery      eyes, nose  . Abdominoplasty    . Liposuction  15 to 20 yrs ago  . Surgery for kidney stones    . Right index finger surgery  06/19/11    AT [facility]  . Laparoscopic nissen fundoplication  4/53/6468    Procedure: LAPAROSCOPIC NISSEN FUNDOPLICATION;  Surgeon: Pedro Earls, MD;  Location: [facility];  Service: General;  Laterality: N/A;  Laparoscopic Nissen repair of hiatal hernia  with lighted bougie  . Esophageal manometry  02/17/2012    Procedure: ESOPHAGEAL MANOMETRY (EM);  Surgeon: Pedro Earls, MD;  [facility];  Service: Endoscopy;  Laterality: N/A;  . Dilation and curettage of uterus  many yrs ago    x 2  . Total knee arthroplasty Left 08/10/2012     Procedure: TOTAL KNEE ARTHROPLASTY;  Surgeon: Mauri Pole, MD;  Location: [facility];  Service: Orthopedics;  Laterality: Left;  . Joint replacement      left  . Open surgical repair of gluteal tendon Right 12/28/2012    Procedure: RIGHT GLUTEAL TENDON REPAIR;  Surgeon: Mauri Pole, MD;  Location: [facility];  Service: Orthopedics;  Laterality: Right;   Family History  Problem Relation Age of Onset  . Throat cancer Father     Smoker  . Heart disease Mother   . Hypertension Mother   . Hip fracture Mother   . Alzheimer's disease Mother   . Stroke Mother   . Stroke Brother 9    smoker   History  Substance Use Topics  . Smoking status: Former Smoker -- 1.00 packs/day for 10 years    Types: Cigarettes    Quit date: 06/04/1971  . Smokeless tobacco: Never Used  . Alcohol Use: No     Comment: RECOVERING ALCOLHOLIC    Review of Systems ROS: Statement: All systems negative except as marked or noted in the HPI; Constitutional: Negative for fever and chills. ; ; Eyes: Negative for eye pain, redness and discharge. ; ; ENMT: Negative for ear pain, hoarseness, nasal congestion, sinus pressure and sore throat. ; ;  Cardiovascular: Negative for chest pain, palpitations, diaphoresis, dyspnea and peripheral edema. ; ; Respiratory: Negative for cough, wheezing and stridor. ; ; Gastrointestinal: +nausea. Negative for vomiting, diarrhea, abdominal pain, blood in stool, hematemesis, jaundice and rectal bleeding. . ; ; Genitourinary: +dysuria, urinary frequency. Negative for hematuria. ; ; Musculoskeletal: +LBP. Negative for neck pain. Negative for swelling and trauma.; ; Skin: Negative for pruritus, rash, abrasions, blisters, bruising and skin lesion.; ; Neuro: Negative for headache, lightheadedness and neck stiffness. Negative for weakness, altered level of consciousness , altered mental status, extremity weakness, paresthesias, involuntary movement, seizure and syncope.      Allergies  Gabapentin; Lipitor;  Lyrica; Penicillins; and Latex  Home Medications   Prior to Admission medications   Medication Sig Start Date End Date Taking? Authorizing Provider  acetaminophen (TYLENOL) 325 MG tablet Take 650 mg by mouth every 6 (six) hours as needed.   Yes Historical Provider, MD  albuterol (VENTOLIN HFA) 108 (90 BASE) MCG/ACT inhaler Inhale 2 puffs into the lungs every 4 (four) hours as needed for wheezing or shortness of breath (or cough). 04/11/14  Yes Colon Branch, MD  LORazepam (ATIVAN) 1 MG tablet TAKE 1 TABLET BY MOUTH EVERY EVENING Patient taking differently: Take 1 mg by mouth at bedtime as needed for sleep.  09/09/13  Yes Dorena Cookey, MD  omeprazole (PRILOSEC) 20 MG capsule Take 20 mg by mouth 2 (two) times daily before a meal.    Yes Historical Provider, MD  pramipexole (MIRAPEX) 1 MG tablet Take 1 mg by mouth 2 (two) times daily. 1 by mouth each bedtime 09/09/13  Yes Dorena Cookey, MD  XARELTO 20 MG TABS tablet TAKE 1 TABLET BY MOUTH EVERY DAY WITH SUPPER 03/10/14  Yes Dorena Cookey, MD   BP 154/69 mmHg  Pulse 80  Temp(Src) 97.4 F (36.3 C) (Oral)  Resp 14  SpO2 98% Physical Exam  0855: Physical examination:  Nursing notes reviewed; Vital signs and O2 SAT reviewed;  Constitutional: Well developed, Well nourished, Well hydrated, Uncomfortable appearing.; Head:  Normocephalic, atraumatic; Eyes: EOMI, PERRL, No scleral icterus; ENMT: Mouth and pharynx normal, Mucous membranes moist; Neck: Supple, Full range of motion, No lymphadenopathy; Cardiovascular: Regular rate and rhythm, No gallop; Respiratory: Breath sounds clear & equal bilaterally, No rales, rhonchi, wheezes.  Speaking full sentences with ease, Normal respiratory effort/excursion; Chest: Nontender, Movement normal; Abdomen: Soft, Nontender, Nondistended, Normal bowel sounds; Genitourinary: No CVA tenderness; Spine:  No midline CS, TS, LS tenderness. +TTP left lumbar paraspinal muscles.;; Extremities: Pulses normal, No tenderness, No edema,  No calf edema or asymmetry.; Neuro: AA&Ox3, Major CN grossly intact.  Speech clear. No gross focal motor or sensory deficits in extremities. Strength 5/5 equal bilat UE's and LE's.; Skin: Color normal, Warm, Dry.   ED Course  Procedures     MDM  MDM Reviewed: previous chart, nursing note and vitals Reviewed previous: labs Interpretation: labs and CT scan     Results for orders placed or performed during the hospital encounter of 04/26/14  Urinalysis, Routine w reflex microscopic  Result Value Ref Range   Color, Urine YELLOW YELLOW   APPearance CLOUDY (A) CLEAR   Specific Gravity, Urine 1.014 1.005 - 1.030   pH 7.0 5.0 - 8.0   Glucose, UA NEGATIVE NEGATIVE mg/dL   Hgb urine dipstick NEGATIVE NEGATIVE   Bilirubin Urine NEGATIVE NEGATIVE   Ketones, ur NEGATIVE NEGATIVE mg/dL   Protein, ur NEGATIVE NEGATIVE mg/dL   Urobilinogen, UA 0.2 0.0 - 1.0 mg/dL  Nitrite NEGATIVE NEGATIVE   Leukocytes, UA NEGATIVE NEGATIVE  I-stat Chem 8, ED  Result Value Ref Range   Sodium 139 137 - 147 mEq/L   Potassium 3.8 3.7 - 5.3 mEq/L   Chloride 105 96 - 112 mEq/L   BUN 9 6 - 23 mg/dL   Creatinine, Ser 0.50 0.50 - 1.10 mg/dL   Glucose, Bld 110 (H) 70 - 99 mg/dL   Calcium, Ion 1.18 1.13 - 1.30 mmol/L   TCO2 21 0 - 100 mmol/L   Hemoglobin 12.9 12.0 - 15.0 g/dL   HCT 38.0 36.0 - 46.0 %   Ct Abdomen Pelvis Wo Contrast 04/26/2014   CLINICAL DATA:  Bilateral flank pain and dysuria  EXAM: CT ABDOMEN AND PELVIS WITHOUT CONTRAST  TECHNIQUE: Multidetector CT imaging of the abdomen and pelvis was performed following the standard protocol without oral or intravenous contrast material administration.  COMPARISON:  March 07, 2010  FINDINGS: There is mild bibasilar lung atelectatic change. There is a focal hiatal hernia with evidence of prior surgery at the gastroesophageal junction.  Liver is prominent, measuring 18.3 cm in length. There is a probable cyst in the anterior segment of the right lobe seen on  slice 21 series 2 measuring 8 x 6 mm. There is a probable tiny cyst also in the posterior segment of the right lobe of the liver near the dome measuring 6 x 5 mm. A third probable cyst in the anterior right lobe near the dome measures 6 x 5 mm. A small focus of decreased attenuation just to the left of the fissure for the ligamentum teres measures 8 x 6 mm. This small lesion most likely represents a cyst or hamartoma. There is a focal cyst in the medial segment of the left lobe of the liver inferiorly measuring 2.0 x 1.5 cm. Gallbladder wall is not thickened. There is no appreciable biliary duct dilatation.  Spleen, pancreas, and adrenals appear within normal limits and stable. 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 There is a nearby cyst arising from the upper pole of the right kidney measuring 4.0 by 2.8 cm. There is a focal aneurysm arising from the right renal artery midportion, partially calcified measuring 2.3 x 1.8 cm, slightly larger than on prior study from 2011. There is no appreciable mass on the left. There is a stable extrarenal pelvis on the left. There is no hydronephrosis on either side. There is no renal or ureteral calculus on either side.  In the pelvis, the urinary bladder is midline with normal wall thickness. There is no pelvic mass or fluid. The appendix region appears unremarkable without inflammation. Terminal ileum appears normal.  There is no appreciable bowel obstruction. No free air or portal venous air. There is no appreciable ascites, adenopathy, or abscess in the abdomen or pelvis. There is atherosclerotic change in the aorta but no apparent aneurysm. There is extensive postoperative change in the lumbar spine. There is osteoarthritic change in both sacroiliac joints. There is postoperative change in the proximal right femur laterally. There is a probable bone island left intertrochanteric femur region, stable  from prior study. In comparison with the prior study, extensive sclerotic type changes noted in the T12, L1, and L2 vertebral bodies.  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 This aneurysm is partially calcified. Given enlargement compared to prior study, this finding warrants close  clinical surveillance. If further imaging is felt to be desirable, CT angiography would be the imaging study of choice to further evaluate.  There is no hydronephrosis on either side. There is no renal or ureteral calculus on either side. 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  Liver is prominent with several benign-appearing masses within the liver.  No bowel obstruction. No abscess. No mesenteric inflammation. Appendix region appears normal.  There is a hiatal hernia. There is evidence of previous fundoplication procedure.  Extensive postoperative changes noted throughout the lumbar spine. Extensive sclerotic changes noted at T12, L1, and L2. This extensive sclerotic change is located in areas of extensive arthropathy and may represent progression of degenerative type change. Given the extent of sclerotic change in this area, the possibility of underlying sclerotic neoplasia must be of concern. As breast carcinoma is the most likely etiology for sclerotic metastases in a woman, breast examination and mammography advised to further assess.   Electronically Signed   By: Lowella Grip M.D.   On: 04/26/2014 10:19   Ct Cta Abd/pel W/cm &/or W/o Cm 04/26/2014   CLINICAL DATA:  Bilateral flank pain and difficulty urinating. 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  EXAM: CT ANGIOGRAPHY ABDOMEN AND PELVIS  TECHNIQUE: Multidetector CT imaging of the abdomen and pelvis was performed using the standard protocol during bolus administration of intravenous contrast. Multiplanar reconstructed  images including MIPs were obtained and reviewed to evaluate the vascular anatomy.  CONTRAST:  100 mL Omnipaque 350  COMPARISON:  None  FINDINGS: ARTERIAL FINDINGS:  Aorta: Normal caliber of the abdominal aorta without aneurysm or dissection.  Celiac axis: Celiac trunk and main branches are widely patent.  Superior mesenteric: Superior mesenteric artery and main branches are widely patent.  Left renal: Main left renal artery is widely patent. There is an accessory inferior left renal artery. There is a small aneurysm involving the left renal artery at the hilum and branching site. This measures up to 1.3 cm. Unclear if this has significantly changed.  Right renal: Single right renal artery. There is a bilobed aneurysm involving the right renal artery at the hilum and the branching site. This aneurysm has a small amount of peripheral thrombus. Aneurysm measures 2.2 x 1.5 cm. This structure roughly measured up to 2.2 cm on an outside MRI from 04/12/2013 and previous non contrast CT from 03/07/2010. Prior examinations were not optimal for evaluating this aneurysm but suspect there has been minimal change.  Inferior mesenteric: Proximal IMA is patent.  Left iliac: Left iliac arteries are patent without aneurysm or significant plaque.  Right iliac: Right iliac arteries are patent without significant plaque or aneurysm.  Venous findings: Iliac veins, IVC and renal veins are patent. Portal venous system is patent.  Review of the MIP images confirms the above findings.  NONVASCULAR FINDINGS:  Lung bases are clear. Small moderate-sized hiatal hernia. Calcifications or high-density material near the GE junction. Mild dilatation of the pancreatic duct without an underlying pancreatic lesion. Again noted are multiple low-density structures in the liver which are suggestive for cysts. Gallbladder is mildly distended. Normal appearance of the left kidney andadrenal glands. Evidence for right renal cysts. Again noted is a  heterogeneous structure in the right kidney upper pole with some fat attenuation. This structure measures up to 2.3 cm and measured up to 2.4 cm in 2011. 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 No significant free fluid or lymphadenopathy.  Difficult to exclude is small uterine fibroids. Normal appearance of the urinary bladder. No gross abnormality to the adnexa tissue. No acute abnormality in the small or large bowel. Surgical hardware in the proximal right femur. Plate and screw fixation from L3-S1. Severe endplate changes at P50-D3.  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 This aneurysm roughly measures up to 2.2 cm and there has been minimal change since 2011. Recommend non emergent vascular surgery consultation for management and surveillance. In addition, there is a small aneurysm involving the left renal artery, measuring up to 1.3 cm.  Stable fat attenuating lesion along the right kidney upper pole. 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  Hepatic and renal cysts.   Electronically Signed   By: Markus Daft M.D.   On: 04/26/2014 12:34    1250:  CT scan with sclerotic lesions, likely representing degenerative change. This d/w pt: she states she has yearly (normal) mammograms.  CT-A with what appears to be stable right renal artery aneurysm. T/C to Vascular Surgeon Dr. Scot Dock, case discussed, including:  HPI, pertinent PM/SHx, VS/PE, dx testing, ED course and treatment:  States pt will need to be referred to [facility] Vascular Surgeon to follow.  T/C to [facility] Vascular Surgeon Dr. Cathren Laine, case discussed, including:  HPI, pertinent PM/SHx, VS/PE, dx testing, ED course and treatment:  No emergent need for intervention at this time, she can f/u in office in the next few weeks, please have Rads make disc of all her images to show him at pt's office visit, he will have office call pt for appt. Pt has ambulated in the ED with her cane per  her baseline gait. VS remain stable. Neuro exam intact and unchanged. Dx and testing, as well as d/w Vascular MD, d/w pt.  Questions answered.  Verb understanding, agreeable to d/c home with outpt f/u.     Francine Graven, DO 04/29/14 2157

## 2014-04-27 LAB — URINE CULTURE
Colony Count: NO GROWTH
Culture: NO GROWTH

## 2014-05-02 DIAGNOSIS — I722 Aneurysm of renal artery: Secondary | ICD-10-CM | POA: Diagnosis not present

## 2014-05-03 DIAGNOSIS — K219 Gastro-esophageal reflux disease without esophagitis: Secondary | ICD-10-CM | POA: Diagnosis not present

## 2014-05-03 DIAGNOSIS — R1314 Dysphagia, pharyngoesophageal phase: Secondary | ICD-10-CM | POA: Diagnosis not present

## 2014-05-05 ENCOUNTER — Encounter: Payer: Self-pay | Admitting: Family Medicine

## 2014-05-05 ENCOUNTER — Ambulatory Visit (INDEPENDENT_AMBULATORY_CARE_PROVIDER_SITE_OTHER): Payer: Medicare Other | Admitting: Family Medicine

## 2014-05-05 VITALS — BP 160/90 | Temp 98.0°F | Wt 189.0 lb

## 2014-05-05 DIAGNOSIS — I10 Essential (primary) hypertension: Secondary | ICD-10-CM | POA: Diagnosis not present

## 2014-05-05 MED ORDER — HYDROCHLOROTHIAZIDE 12.5 MG PO TABS: 12.5000 mg | ORAL_TABLET | Freq: Every day | ORAL | Status: DC

## 2014-05-05 MED ORDER — METHOCARBAMOL 500 MG PO TABS: ORAL_TABLET | ORAL | Status: DC

## 2014-05-05 NOTE — Progress Notes (Signed)
Pre visit review using our clinic review tool, if applicable. No additional management support is needed unless otherwise documented below in the visit note. 

## 2014-05-05 NOTE — Patient Instructions (Signed)
Salt free diet  Hydrochlorothiazide 12.5 mg.......Marland Kitchen 1 daily in the morning  BP check every morning..........[facility]  pump up digital blood pressure cuff.........Marland Kitchen [place]  Return December 15 for follow-up

## 2014-05-05 NOTE — Progress Notes (Signed)
   Subjective:    Patient ID: Felicia Cruz, female    DOB: May 30, 1941, 73 y.o.   MRN: [id number]  HPI Felicia Cruz is a 73 year old widowed female nonsmoker who comes in today for evaluation of hypertension  She was recently seen in [facility] emergency room and referred to Felicia Cruz because of her renal artery aneurysm. There are evaluation showed a nonsignificant lesion however they noted her blood pressure was elevated. Over the last couple months is been in the 160/90 range.  No family history of hypertension  No over-the-counter medications that could cause hypertension  She still dealing with the results for back surgery and would like a refill of Robaxin. She takes 1 a day at bedtime. She is due to come off her Felicia Cruz toe which is been approved by her pulmonologist Felicia Cruz   Review of Systems    review of systems otherwise negative Objective:   Physical Exam Well-developed well-nourished female no acute distress vital signs stable she's afebrile BP right arm sitting position 170/84       Assessment & Plan:  Hypertension Set...................... salt free diet......... hydrochlorothiazide 25 mg daily........ BP check daily at home..... Follow-up in 2 weeks

## 2014-05-06 ENCOUNTER — Telehealth: Payer: Self-pay | Admitting: Family Medicine

## 2014-05-06 NOTE — Telephone Encounter (Signed)
emmi emailed °

## 2014-05-08 ENCOUNTER — Other Ambulatory Visit: Payer: Self-pay | Admitting: Family Medicine

## 2014-05-17 ENCOUNTER — Ambulatory Visit (INDEPENDENT_AMBULATORY_CARE_PROVIDER_SITE_OTHER): Payer: Medicare Other | Admitting: Internal Medicine

## 2014-05-17 ENCOUNTER — Encounter: Payer: Self-pay | Admitting: Internal Medicine

## 2014-05-17 ENCOUNTER — Ambulatory Visit (INDEPENDENT_AMBULATORY_CARE_PROVIDER_SITE_OTHER): Payer: Medicare Other | Admitting: Family Medicine

## 2014-05-17 ENCOUNTER — Encounter: Payer: Self-pay | Admitting: Family Medicine

## 2014-05-17 VITALS — BP 126/72 | HR 72 | Ht 66.0 in | Wt 188.8 lb

## 2014-05-17 VITALS — BP 158/98 | Temp 97.9°F | Wt 187.0 lb

## 2014-05-17 DIAGNOSIS — R05 Cough: Secondary | ICD-10-CM | POA: Diagnosis not present

## 2014-05-17 DIAGNOSIS — I10 Essential (primary) hypertension: Secondary | ICD-10-CM

## 2014-05-17 DIAGNOSIS — Z1211 Encounter for screening for malignant neoplasm of colon: Secondary | ICD-10-CM | POA: Diagnosis not present

## 2014-05-17 DIAGNOSIS — R059 Cough, unspecified: Secondary | ICD-10-CM

## 2014-05-17 MED ORDER — LOSARTAN POTASSIUM-HCTZ 50-12.5 MG PO TABS: 1.0000 | ORAL_TABLET | Freq: Every day | ORAL | Status: DC

## 2014-05-17 MED ORDER — MOVIPREP 100 G PO SOLR: 1.0000 | Freq: Once | ORAL | Status: DC

## 2014-05-17 NOTE — Progress Notes (Addendum)
Felicia Cruz 1941/02/26 [id number]  Note: This dictation was prepared with Dragon digital system. Any transcriptional errors that result from this procedure are unintentional.   History of Present Illness:  This is a 73 year old white female who is here to discuss a recall colonoscopy. Her last exam in November 2005 was normal. In the meantime, she had a Nissen fundoplication in 2637 for gastroesophageal reflux and chronic cough. Unfortunately, the cough has persisted. She also had hip surgery and a postoperative pulmonary embolus. She discontinued Xarelto several weeks ago. She is interested in a recall colonoscopy. There is no family history of colon cancer. She has regular bowel movements and denies rectal bleeding.     Past Medical History  Diagnosis Date  . RLS (restless legs syndrome)   . Esophageal stricture 2010  . Osteopenia   . Hyperlipemia   . Arthritis   . Kidney stones   . GERD (gastroesophageal reflux disease)   . Substance abuse     RECOVERING ALCOLHOLIC  . Alcoholism     HAS NOT HAD [place]  . Cataract   . 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 Hiatal hernia   . DDD (degenerative disc disease), lumbar   . Chronic back pain     Past Surgical History  Procedure Laterality Date  . Lumbar disc surgery  2008  . Facial cosmetic surgery      eyes, nose  . Abdominoplasty    . Liposuction  15 to 20 yrs ago  . Surgery for kidney stones    . Right index finger surgery  06/19/11    AT [facility]  . Laparoscopic nissen fundoplication  8/58/8502    Procedure: LAPAROSCOPIC NISSEN FUNDOPLICATION;  Surgeon: Pedro Earls, MD;  Location: [facility];  Service: General;  Laterality: N/A;  Laparoscopic Nissen repair of hiatal hernia  with lighted bougie  . Esophageal manometry  02/17/2012    Procedure: ESOPHAGEAL MANOMETRY (EM);  Surgeon: Pedro Earls, MD;  [facility];  Service: Endoscopy;  Laterality: N/A;  . Dilation and  curettage of uterus  many yrs ago    x 2  . 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 Joint replacement      left  . Open surgical repair of gluteal tendon Right 12/28/2012    Procedure: RIGHT GLUTEAL TENDON REPAIR;  Surgeon: Mauri Pole, MD;  Location: [facility];  Service: Orthopedics;  Laterality: Right;    Allergies  Allergen Reactions  . Gabapentin Other (See Comments)    Chest pain  . Lipitor [Atorvastatin]     Memory loss, confusion, hallucinations  . Lyrica [Pregabalin] Other (See Comments)    Water blisters on face  . Penicillins Hives, Shortness Of Breath and Itching  . Latex Other (See Comments)    Skin peeling (thumb)    Family history and social history have been reviewed.  Review of Systems: Denies constipation, rectal bleeding. Positive for weight gain   The remainder of the 10 point ROS is negative except as outlined in the H&P  Physical Exam: General Appearance Well developed, in no distress Eyes  Non icteric  HEENT  Non traumatic, normocephalic  Mouth No lesion, tongue papillated, no cheilosis Neck Supple without adenopathy, thyroid not enlarged, no carotid bruits, no JVD Lungs Clear to auscultation bilaterally COR Normal S1, normal S2,  regular rhythm, no murmur, quiet precordium Abdomen  mildly protuberant. Voluntary guarding. No tenderness. Normoactive bowel sounds. Liver edge at costal margin  Rectal  deferred to colonoscopy  Extremities  No pedal edema Skin No lesions Neurological Alert and oriented x 3 Psychological Normal mood and affect  Assessment and Plan:   Problem #3 73 year old white female who is due for a recall colonoscopy which we scheduled in January 2016. We have discussed the prep and the sedation. She is off anticoagulants.     Felicia Cruz 05/17/2014

## 2014-05-17 NOTE — Patient Instructions (Signed)
You have been scheduled for a colonoscopy. Please follow written instructions given to you at your visit today.  Please pick up your prep kit at the pharmacy within the next 1-3 days. If you use inhalers (even only as needed), please bring them with you on the day of your procedure. Your physician has requested that you go to www.startemmi.com and enter the access code given to you at your visit today. This web site gives a general overview about your procedure. However, you should still follow specific instructions given to you by our office regarding your preparation for the procedure.  CC:Dr J.Todd

## 2014-05-17 NOTE — Progress Notes (Signed)
Pre visit review using our clinic review tool, if applicable. No additional management support is needed unless otherwise documented below in the visit note. 

## 2014-05-17 NOTE — Patient Instructions (Signed)
Stop the hydrochlorothiazide  Hyzaar 50-12.5.......... one tablet daily in the morning  BP check daily in the morning  Follow-up in 4 weeks.......Marland Kitchen bring a record of all your blood pressure readings when you return and the device so we can check to be sure it's calibrated right

## 2014-05-17 NOTE — Progress Notes (Signed)
   Subjective:    Patient ID: Felicia Cruz, female    DOB: 10-Jun-1940, 73 y.o.   MRN: [id number]  HPI Felicia Cruz is a 73 year old married female nonsmoker who comes in today for follow-up of accelerated hypertension. We bumped her diuretic up however blood pressure still is not at goal. We talked about various options of add-ons. I don't think an ACE inhibitor would be appropriate K she has a chronic cough. We will try Cozaar   Review of Systems Review of systems otherwise negative    Objective:   Physical Exam  Well-developed well-nourished female no acute distress vital signs stable she's afebrile BP 1 6098 right arm sitting position      Assessment & Plan:  Hypertension not at goal............... discontinue hydrochlorothiazide.......Marland Kitchen begin Hyzaar......... BP check daily at home follow-up in one month.

## 2014-06-06 DIAGNOSIS — Z1231 Encounter for screening mammogram for malignant neoplasm of breast: Secondary | ICD-10-CM | POA: Diagnosis not present

## 2014-06-08 ENCOUNTER — Ambulatory Visit (AMBULATORY_SURGERY_CENTER): Payer: Medicare Other | Admitting: Internal Medicine

## 2014-06-08 ENCOUNTER — Encounter: Payer: Self-pay | Admitting: Internal Medicine

## 2014-06-08 VITALS — BP 173/85 | HR 74 | Temp 98.2°F | Resp 24 | Ht 66.0 in | Wt 187.0 lb

## 2014-06-08 DIAGNOSIS — Z1211 Encounter for screening for malignant neoplasm of colon: Secondary | ICD-10-CM

## 2014-06-08 MED ORDER — SODIUM CHLORIDE 0.9 % IV SOLN: 500.0000 mL | INTRAVENOUS | Status: DC

## 2014-06-08 NOTE — Patient Instructions (Signed)
YOU HAD AN ENDOSCOPIC PROCEDURE TODAY AT THE [facility]: Refer to the procedure report that was given to you for any specific questions about what was found during the examination.  If the procedure report does not answer your questions, please call your gastroenterologist to clarify.  If you requested that your care partner not be given the details of your procedure findings, then the procedure report has been included in a sealed envelope for you to review at your convenience later.  YOU SHOULD EXPECT: Some feelings of bloating in the abdomen. Passage of more gas than usual.  Walking can help get rid of the air that was put into your GI tract during the procedure and reduce the bloating. If you had a lower endoscopy (such as a colonoscopy or flexible sigmoidoscopy) you may notice spotting of blood in your stool or on the toilet paper. If you underwent a bowel prep for your procedure, then you may not have a normal bowel movement for a few days.  DIET: Your first meal following the procedure should be a light meal and then it is ok to progress to your normal diet.  A half-sandwich or bowl of soup is an example of a good first meal.  Heavy or fried foods are harder to digest and may make you feel nauseous or bloated.  Likewise meals heavy in dairy and vegetables can cause extra gas to form and this can also increase the bloating.  Drink plenty of fluids but you should avoid alcoholic beverages for 24 hours.  ACTIVITY: Your care partner should take you home directly after the procedure.  You should plan to take it easy, moving slowly for the rest of the day.  You can resume normal activity the day after the procedure however you should NOT DRIVE or use heavy machinery for 24 hours (because of the sedation medicines used during the test).    SYMPTOMS TO REPORT IMMEDIATELY: A gastroenterologist can be reached at any hour.  During normal business hours, 8:30 AM to 5:00 PM Monday through Friday,  call [phone].  After hours and on weekends, please call the GI answering service at [phone] who will take a message and have the physician on call contact you.   Following lower endoscopy (colonoscopy or flexible sigmoidoscopy):  Excessive amounts of blood in the stool  Significant tenderness or worsening of abdominal pains  Swelling of the abdomen that is new, acute  Fever of 100F or higher  FOLLOW UP: If any biopsies were taken you will be contacted by phone or by letter within the next 1-3 weeks.  Call your gastroenterologist if you have not heard about the biopsies in 3 weeks.  Our staff will call the home number listed on your records the next business day following your procedure to check on you and address any questions or concerns that you may have at that time regarding the information given to you following your procedure. This is a courtesy call and so if there is no answer at the home number and we have not heard from you through the emergency physician on call, we will assume that you have returned to your regular daily activities without incident.  SIGNATURES/CONFIDENTIALITY: You and/or your care partner have signed paperwork which will be entered into your electronic medical record.  These signatures attest to the fact that that the information above on your After Visit Summary has been reviewed and is understood.  Full responsibility of the confidentiality of this   discharge information lies with you and/or your care-partner.  Recommendations High fiber diet handout provided to patient/care partner.

## 2014-06-08 NOTE — Op Note (Signed)
[place]  [address] [place], [address]   COLONOSCOPY PROCEDURE REPORT  PATIENT: Felicia Cruz, Felicia Cruz  MR#: [id number] BIRTHDATE: Aug 19, 1940 , 73  yrs. old GENDER: female ENDOSCOPIST: Lafayette Dragon, MD REFERRED LN:[id number] Delora Fuel, M.D. PROCEDURE DATE:  06/08/2014 PROCEDURE:   Colonoscopy, screening First Screening Colonoscopy - Avg.  risk and is 50 yrs.  old or older - No.  Prior Negative Screening - Now for repeat screening. 10 or more years since last screening  History of Adenoma - Now for follow-up colonoscopy & has been > or = to 3 yrs.  N/A  Polyps Removed Today? No.  Polyps Removed Today? No.  Recommend repeat exam, <10 yrs? Polyps Removed Today? No.  Recommend repeat exam, <10 yrs? No. ASA CLASS:   Class II INDICATIONS:last colonoscopy in November 2005 was normal. MEDICATIONS: Monitored anesthesia care and Propofol 300 mg IV  DESCRIPTION OF PROCEDURE:   After the risks benefits and alternatives of the procedure were thoroughly explained, informed consent was obtained.  The digital rectal exam revealed no abnormalities of the rectum.   The LB [id number]  endoscope was introduced through the anus and advanced to the cecum, which was identified by both the appendix and ileocecal valve. No adverse events experienced.   The quality of the prep was good, using MoviPrep  The instrument was then slowly withdrawn as the colon was fully examined.      COLON FINDINGS: A normal appearing cecum, ileocecal valve, and appendiceal orifice were identified.  The ascending, transverse, descending, sigmoid colon, and rectum appeared unremarkable. Retroflexed views revealed no abnormalities. The time to cecum=4 minutes 56 seconds.  Withdrawal time=8 minutes 59 seconds.  The scope was withdrawn and the procedure completed. COMPLICATIONS: There were no immediate complications.  ENDOSCOPIC IMPRESSION: Normal colonoscopy  RECOMMENDATIONS: High fiber diet no  recall due to age  eSigned:  Lafayette Dragon, MD 06/08/2014 8:56 AM   cc:

## 2014-06-08 NOTE — Progress Notes (Signed)
A/ox3 pleased with MAC, report to Wendy RN 

## 2014-06-09 ENCOUNTER — Telehealth: Payer: Self-pay | Admitting: *Deleted

## 2014-06-09 NOTE — Telephone Encounter (Signed)
  Follow up Call-  Call back number 06/08/2014  Post procedure Call Back phone  # [phone]  Permission to leave phone message Yes     Patient questions:  Do you have a fever, pain , or abdominal swelling? No. Pain Score  0 *  Have you tolerated food without any problems? Yes.    Have you been able to return to your normal activities? Yes.    Do you have any questions about your discharge instructions: Diet   No. Medications  No. Follow up visit  No.  Do you have questions or concerns about your Care? No.  Actions: * If pain score is 4 or above: No action needed, pain <4.

## 2014-06-29 DIAGNOSIS — H2513 Age-related nuclear cataract, bilateral: Secondary | ICD-10-CM | POA: Diagnosis not present

## 2014-07-04 ENCOUNTER — Ambulatory Visit (INDEPENDENT_AMBULATORY_CARE_PROVIDER_SITE_OTHER): Payer: Medicare Other | Admitting: Family Medicine

## 2014-07-04 ENCOUNTER — Encounter: Payer: Self-pay | Admitting: Family Medicine

## 2014-07-04 VITALS — BP 134/78 | HR 93 | Temp 98.1°F | Ht 66.0 in | Wt 181.0 lb

## 2014-07-04 DIAGNOSIS — I10 Essential (primary) hypertension: Secondary | ICD-10-CM

## 2014-07-04 DIAGNOSIS — S76011S Strain of muscle, fascia and tendon of right hip, sequela: Secondary | ICD-10-CM | POA: Diagnosis not present

## 2014-07-04 MED ORDER — CYCLOBENZAPRINE HCL 10 MG PO TABS: ORAL_TABLET | ORAL | Status: DC

## 2014-07-04 MED ORDER — LOSARTAN POTASSIUM-HCTZ 100-12.5 MG PO TABS: 1.0000 | ORAL_TABLET | Freq: Every day | ORAL | Status: DC

## 2014-07-04 NOTE — Progress Notes (Signed)
   Subjective:    Patient ID: Felicia Cruz, female    DOB: Jul 19, 1940, 74 y.o.   MRN: [id number]  HPI Felicia Cruz is a 74 year old widowed female comes in today for follow-up of hypertension  We started her on Hyzaar 50-12.5 daily a month ago because of high blood pressure. She comes in today for follow-up. BPs at home and dropped to normal however she's had to take 2 of the 50 mg tablets to make that happen  She had a history of a gluteal Felicia Cruz had a couple surgeries. She's had side effects from a lot of muscle relaxants would like to try something different. All the medicine she's taken which are Lyrica and Robaxin give her side effects   Review of Systems    review of systems otherwise negative Objective:   Physical Exam Well-developed well-nourished female no acute distress vital signs stable she's afebrile BP right arm sitting position 134/78 pulse 70 and regular       Assessment & Plan:  Hypertension at goal on Cozaar 100 mg daily......... continue that dose follow-up in the fall

## 2014-07-04 NOTE — Addendum Note (Signed)
Addended by: Townsend Roger D on: 07/04/2014 04:56 PM   Modules accepted: Orders

## 2014-07-04 NOTE — Patient Instructions (Signed)
Hyzaar 100 mg...........Marland Kitchen 1 daily in the morning  Check your blood pressure once weekly  Return in the fall for follow-up sooner if any problems

## 2014-07-12 DIAGNOSIS — H2513 Age-related nuclear cataract, bilateral: Secondary | ICD-10-CM | POA: Diagnosis not present

## 2014-07-25 ENCOUNTER — Other Ambulatory Visit: Payer: Self-pay | Admitting: Dermatology

## 2014-07-25 DIAGNOSIS — D485 Neoplasm of uncertain behavior of skin: Secondary | ICD-10-CM | POA: Diagnosis not present

## 2014-07-25 DIAGNOSIS — C44629 Squamous cell carcinoma of skin of left upper limb, including shoulder: Secondary | ICD-10-CM | POA: Diagnosis not present

## 2014-07-25 DIAGNOSIS — Z85828 Personal history of other malignant neoplasm of skin: Secondary | ICD-10-CM | POA: Diagnosis not present

## 2014-07-25 DIAGNOSIS — L570 Actinic keratosis: Secondary | ICD-10-CM | POA: Diagnosis not present

## 2014-07-25 DIAGNOSIS — D225 Melanocytic nevi of trunk: Secondary | ICD-10-CM | POA: Diagnosis not present

## 2014-07-25 DIAGNOSIS — L821 Other seborrheic keratosis: Secondary | ICD-10-CM | POA: Diagnosis not present

## 2014-07-29 DIAGNOSIS — N200 Calculus of kidney: Secondary | ICD-10-CM | POA: Diagnosis not present

## 2014-07-29 DIAGNOSIS — D3000 Benign neoplasm of unspecified kidney: Secondary | ICD-10-CM | POA: Diagnosis not present

## 2014-08-04 DIAGNOSIS — M419 Scoliosis, unspecified: Secondary | ICD-10-CM | POA: Diagnosis not present

## 2014-08-04 DIAGNOSIS — M4180 Other forms of scoliosis, site unspecified: Secondary | ICD-10-CM | POA: Diagnosis not present

## 2014-08-16 ENCOUNTER — Telehealth: Payer: Self-pay | Admitting: *Deleted

## 2014-08-16 DIAGNOSIS — M549 Dorsalgia, unspecified: Secondary | ICD-10-CM

## 2014-08-16 DIAGNOSIS — M5136 Other intervertebral disc degeneration, lumbar region: Secondary | ICD-10-CM

## 2014-08-16 DIAGNOSIS — M8580 Other specified disorders of bone density and structure, unspecified site: Secondary | ICD-10-CM

## 2014-08-16 DIAGNOSIS — G8929 Other chronic pain: Secondary | ICD-10-CM

## 2014-08-16 NOTE — Telephone Encounter (Signed)
Referral placed.

## 2014-09-01 DIAGNOSIS — H2512 Age-related nuclear cataract, left eye: Secondary | ICD-10-CM | POA: Diagnosis not present

## 2014-09-01 DIAGNOSIS — H25812 Combined forms of age-related cataract, left eye: Secondary | ICD-10-CM | POA: Diagnosis not present

## 2014-09-15 DIAGNOSIS — Z86711 Personal history of pulmonary embolism: Secondary | ICD-10-CM | POA: Diagnosis not present

## 2014-09-15 DIAGNOSIS — Z9889 Other specified postprocedural states: Secondary | ICD-10-CM | POA: Diagnosis not present

## 2014-09-15 DIAGNOSIS — Z8739 Personal history of other diseases of the musculoskeletal system and connective tissue: Secondary | ICD-10-CM | POA: Diagnosis not present

## 2014-09-15 DIAGNOSIS — M549 Dorsalgia, unspecified: Secondary | ICD-10-CM | POA: Diagnosis not present

## 2014-10-06 DIAGNOSIS — H25811 Combined forms of age-related cataract, right eye: Secondary | ICD-10-CM | POA: Diagnosis not present

## 2014-10-06 DIAGNOSIS — H2511 Age-related nuclear cataract, right eye: Secondary | ICD-10-CM | POA: Diagnosis not present

## 2014-10-17 ENCOUNTER — Other Ambulatory Visit: Payer: Self-pay | Admitting: Family Medicine

## 2014-11-05 ENCOUNTER — Other Ambulatory Visit: Payer: Self-pay | Admitting: Family Medicine

## 2014-11-07 DIAGNOSIS — I350 Nonrheumatic aortic (valve) stenosis: Secondary | ICD-10-CM | POA: Diagnosis not present

## 2014-11-07 DIAGNOSIS — R011 Cardiac murmur, unspecified: Secondary | ICD-10-CM | POA: Diagnosis not present

## 2014-11-07 DIAGNOSIS — I10 Essential (primary) hypertension: Secondary | ICD-10-CM | POA: Diagnosis not present

## 2014-11-07 DIAGNOSIS — M5135 Other intervertebral disc degeneration, thoracolumbar region: Secondary | ICD-10-CM | POA: Insufficient documentation

## 2014-11-07 DIAGNOSIS — Z01810 Encounter for preprocedural cardiovascular examination: Secondary | ICD-10-CM | POA: Diagnosis not present

## 2014-11-07 DIAGNOSIS — I340 Nonrheumatic mitral (valve) insufficiency: Secondary | ICD-10-CM | POA: Diagnosis not present

## 2014-11-14 ENCOUNTER — Telehealth: Payer: Self-pay

## 2014-11-14 NOTE — Telephone Encounter (Signed)
Mammogram done 06/06/14

## 2014-11-16 DIAGNOSIS — D638 Anemia in other chronic diseases classified elsewhere: Secondary | ICD-10-CM | POA: Diagnosis not present

## 2014-11-16 DIAGNOSIS — Z79899 Other long term (current) drug therapy: Secondary | ICD-10-CM | POA: Diagnosis not present

## 2014-11-16 DIAGNOSIS — K219 Gastro-esophageal reflux disease without esophagitis: Secondary | ICD-10-CM | POA: Diagnosis not present

## 2014-11-16 DIAGNOSIS — K913 Postprocedural intestinal obstruction: Secondary | ICD-10-CM | POA: Diagnosis not present

## 2014-11-16 DIAGNOSIS — Z472 Encounter for removal of internal fixation device: Secondary | ICD-10-CM | POA: Diagnosis not present

## 2014-11-16 DIAGNOSIS — M5135 Other intervertebral disc degeneration, thoracolumbar region: Secondary | ICD-10-CM | POA: Diagnosis not present

## 2014-11-16 DIAGNOSIS — G8918 Other acute postprocedural pain: Secondary | ICD-10-CM | POA: Diagnosis not present

## 2014-11-16 DIAGNOSIS — M4806 Spinal stenosis, lumbar region: Secondary | ICD-10-CM | POA: Diagnosis present

## 2014-11-16 DIAGNOSIS — G2581 Restless legs syndrome: Secondary | ICD-10-CM | POA: Diagnosis not present

## 2014-11-16 DIAGNOSIS — Z4889 Encounter for other specified surgical aftercare: Secondary | ICD-10-CM | POA: Diagnosis not present

## 2014-11-16 DIAGNOSIS — Z87891 Personal history of nicotine dependence: Secondary | ICD-10-CM | POA: Diagnosis not present

## 2014-11-16 DIAGNOSIS — Z86711 Personal history of pulmonary embolism: Secondary | ICD-10-CM | POA: Diagnosis not present

## 2014-11-16 DIAGNOSIS — I10 Essential (primary) hypertension: Secondary | ICD-10-CM | POA: Diagnosis present

## 2014-11-16 DIAGNOSIS — Z981 Arthrodesis status: Secondary | ICD-10-CM | POA: Diagnosis not present

## 2014-11-16 DIAGNOSIS — M5136 Other intervertebral disc degeneration, lumbar region: Secondary | ICD-10-CM | POA: Diagnosis not present

## 2014-11-16 DIAGNOSIS — M549 Dorsalgia, unspecified: Secondary | ICD-10-CM | POA: Diagnosis not present

## 2014-11-16 DIAGNOSIS — M419 Scoliosis, unspecified: Secondary | ICD-10-CM | POA: Diagnosis not present

## 2014-11-16 DIAGNOSIS — M5126 Other intervertebral disc displacement, lumbar region: Secondary | ICD-10-CM | POA: Diagnosis not present

## 2014-11-16 DIAGNOSIS — F419 Anxiety disorder, unspecified: Secondary | ICD-10-CM | POA: Diagnosis present

## 2014-11-16 DIAGNOSIS — R140 Abdominal distension (gaseous): Secondary | ICD-10-CM | POA: Diagnosis not present

## 2014-11-16 DIAGNOSIS — M4035 Flatback syndrome, thoracolumbar region: Secondary | ICD-10-CM | POA: Diagnosis not present

## 2014-11-16 DIAGNOSIS — E441 Mild protein-calorie malnutrition: Secondary | ICD-10-CM | POA: Diagnosis not present

## 2014-11-16 DIAGNOSIS — Z8739 Personal history of other diseases of the musculoskeletal system and connective tissue: Secondary | ICD-10-CM | POA: Diagnosis not present

## 2014-11-16 DIAGNOSIS — J449 Chronic obstructive pulmonary disease, unspecified: Secondary | ICD-10-CM | POA: Diagnosis not present

## 2014-11-16 DIAGNOSIS — Z87442 Personal history of urinary calculi: Secondary | ICD-10-CM | POA: Diagnosis not present

## 2014-11-17 DIAGNOSIS — M48061 Spinal stenosis, lumbar region without neurogenic claudication: Secondary | ICD-10-CM | POA: Insufficient documentation

## 2014-11-24 DIAGNOSIS — G2581 Restless legs syndrome: Secondary | ICD-10-CM | POA: Diagnosis not present

## 2014-11-24 DIAGNOSIS — K567 Ileus, unspecified: Secondary | ICD-10-CM | POA: Diagnosis not present

## 2014-11-24 DIAGNOSIS — J449 Chronic obstructive pulmonary disease, unspecified: Secondary | ICD-10-CM | POA: Diagnosis present

## 2014-11-24 DIAGNOSIS — Z7409 Other reduced mobility: Secondary | ICD-10-CM | POA: Diagnosis not present

## 2014-11-24 DIAGNOSIS — R600 Localized edema: Secondary | ICD-10-CM | POA: Diagnosis present

## 2014-11-24 DIAGNOSIS — Z981 Arthrodesis status: Secondary | ICD-10-CM | POA: Diagnosis not present

## 2014-11-24 DIAGNOSIS — K219 Gastro-esophageal reflux disease without esophagitis: Secondary | ICD-10-CM | POA: Diagnosis present

## 2014-11-24 DIAGNOSIS — Z66 Do not resuscitate: Secondary | ICD-10-CM | POA: Diagnosis not present

## 2014-11-24 DIAGNOSIS — Z9981 Dependence on supplemental oxygen: Secondary | ICD-10-CM | POA: Diagnosis not present

## 2014-11-24 DIAGNOSIS — K592 Neurogenic bowel, not elsewhere classified: Secondary | ICD-10-CM | POA: Diagnosis not present

## 2014-11-24 DIAGNOSIS — M5135 Other intervertebral disc degeneration, thoracolumbar region: Secondary | ICD-10-CM | POA: Diagnosis not present

## 2014-11-24 DIAGNOSIS — K5900 Constipation, unspecified: Secondary | ICD-10-CM | POA: Diagnosis present

## 2014-11-24 DIAGNOSIS — I959 Hypotension, unspecified: Secondary | ICD-10-CM | POA: Diagnosis not present

## 2014-11-24 DIAGNOSIS — R000 Tachycardia, unspecified: Secondary | ICD-10-CM | POA: Diagnosis not present

## 2014-11-24 DIAGNOSIS — I2699 Other pulmonary embolism without acute cor pulmonale: Secondary | ICD-10-CM | POA: Diagnosis not present

## 2014-11-24 DIAGNOSIS — Z86711 Personal history of pulmonary embolism: Secondary | ICD-10-CM | POA: Diagnosis not present

## 2014-11-24 DIAGNOSIS — D62 Acute posthemorrhagic anemia: Secondary | ICD-10-CM | POA: Diagnosis not present

## 2014-11-24 DIAGNOSIS — R159 Full incontinence of feces: Secondary | ICD-10-CM | POA: Diagnosis present

## 2014-11-24 DIAGNOSIS — G8222 Paraplegia, incomplete: Secondary | ICD-10-CM | POA: Diagnosis present

## 2014-11-24 DIAGNOSIS — Z87891 Personal history of nicotine dependence: Secondary | ICD-10-CM | POA: Diagnosis not present

## 2014-11-24 DIAGNOSIS — I2692 Saddle embolus of pulmonary artery without acute cor pulmonale: Secondary | ICD-10-CM | POA: Diagnosis not present

## 2014-11-24 DIAGNOSIS — I10 Essential (primary) hypertension: Secondary | ICD-10-CM | POA: Diagnosis not present

## 2014-11-24 DIAGNOSIS — R4189 Other symptoms and signs involving cognitive functions and awareness: Secondary | ICD-10-CM | POA: Diagnosis not present

## 2014-11-24 DIAGNOSIS — Z87442 Personal history of urinary calculi: Secondary | ICD-10-CM | POA: Diagnosis not present

## 2014-11-24 DIAGNOSIS — M5137 Other intervertebral disc degeneration, lumbosacral region: Secondary | ICD-10-CM | POA: Diagnosis present

## 2014-11-24 DIAGNOSIS — R339 Retention of urine, unspecified: Secondary | ICD-10-CM | POA: Diagnosis not present

## 2014-11-24 DIAGNOSIS — K913 Postprocedural intestinal obstruction: Secondary | ICD-10-CM | POA: Diagnosis present

## 2014-11-25 DIAGNOSIS — Z789 Other specified health status: Secondary | ICD-10-CM | POA: Insufficient documentation

## 2014-12-06 DIAGNOSIS — Z9889 Other specified postprocedural states: Secondary | ICD-10-CM | POA: Diagnosis not present

## 2014-12-06 DIAGNOSIS — Z7901 Long term (current) use of anticoagulants: Secondary | ICD-10-CM | POA: Diagnosis not present

## 2014-12-06 DIAGNOSIS — N390 Urinary tract infection, site not specified: Secondary | ICD-10-CM | POA: Diagnosis not present

## 2014-12-06 DIAGNOSIS — R000 Tachycardia, unspecified: Secondary | ICD-10-CM | POA: Diagnosis present

## 2014-12-06 DIAGNOSIS — K219 Gastro-esophageal reflux disease without esophagitis: Secondary | ICD-10-CM | POA: Diagnosis present

## 2014-12-06 DIAGNOSIS — M5135 Other intervertebral disc degeneration, thoracolumbar region: Secondary | ICD-10-CM | POA: Diagnosis present

## 2014-12-06 DIAGNOSIS — R509 Fever, unspecified: Secondary | ICD-10-CM | POA: Diagnosis not present

## 2014-12-06 DIAGNOSIS — I722 Aneurysm of renal artery: Secondary | ICD-10-CM | POA: Diagnosis present

## 2014-12-06 DIAGNOSIS — I10 Essential (primary) hypertension: Secondary | ICD-10-CM | POA: Diagnosis not present

## 2014-12-06 DIAGNOSIS — I959 Hypotension, unspecified: Secondary | ICD-10-CM | POA: Diagnosis present

## 2014-12-06 DIAGNOSIS — R0602 Shortness of breath: Secondary | ICD-10-CM | POA: Diagnosis not present

## 2014-12-06 DIAGNOSIS — I2692 Saddle embolus of pulmonary artery without acute cor pulmonale: Secondary | ICD-10-CM | POA: Diagnosis not present

## 2014-12-06 DIAGNOSIS — G2581 Restless legs syndrome: Secondary | ICD-10-CM | POA: Diagnosis not present

## 2014-12-06 DIAGNOSIS — Z981 Arthrodesis status: Secondary | ICD-10-CM | POA: Diagnosis not present

## 2014-12-06 DIAGNOSIS — R0902 Hypoxemia: Secondary | ICD-10-CM | POA: Diagnosis present

## 2014-12-06 DIAGNOSIS — J449 Chronic obstructive pulmonary disease, unspecified: Secondary | ICD-10-CM | POA: Diagnosis present

## 2014-12-06 DIAGNOSIS — B9620 Unspecified Escherichia coli [E. coli] as the cause of diseases classified elsewhere: Secondary | ICD-10-CM | POA: Diagnosis present

## 2014-12-06 DIAGNOSIS — I2602 Saddle embolus of pulmonary artery with acute cor pulmonale: Secondary | ICD-10-CM | POA: Diagnosis present

## 2014-12-06 DIAGNOSIS — Z87891 Personal history of nicotine dependence: Secondary | ICD-10-CM | POA: Diagnosis not present

## 2014-12-06 DIAGNOSIS — R339 Retention of urine, unspecified: Secondary | ICD-10-CM | POA: Diagnosis not present

## 2014-12-06 DIAGNOSIS — T8351XA Infection and inflammatory reaction due to indwelling urinary catheter, initial encounter: Secondary | ICD-10-CM | POA: Diagnosis present

## 2014-12-06 DIAGNOSIS — K5900 Constipation, unspecified: Secondary | ICD-10-CM | POA: Diagnosis not present

## 2014-12-13 DIAGNOSIS — G2581 Restless legs syndrome: Secondary | ICD-10-CM | POA: Diagnosis not present

## 2014-12-13 DIAGNOSIS — K219 Gastro-esophageal reflux disease without esophagitis: Secondary | ICD-10-CM | POA: Diagnosis not present

## 2014-12-13 DIAGNOSIS — K567 Ileus, unspecified: Secondary | ICD-10-CM | POA: Diagnosis not present

## 2014-12-13 DIAGNOSIS — R339 Retention of urine, unspecified: Secondary | ICD-10-CM | POA: Diagnosis not present

## 2014-12-13 DIAGNOSIS — J449 Chronic obstructive pulmonary disease, unspecified: Secondary | ICD-10-CM | POA: Diagnosis present

## 2014-12-13 DIAGNOSIS — M4800 Spinal stenosis, site unspecified: Secondary | ICD-10-CM | POA: Diagnosis not present

## 2014-12-13 DIAGNOSIS — Z87891 Personal history of nicotine dependence: Secondary | ICD-10-CM | POA: Diagnosis not present

## 2014-12-13 DIAGNOSIS — M5135 Other intervertebral disc degeneration, thoracolumbar region: Secondary | ICD-10-CM | POA: Diagnosis not present

## 2014-12-13 DIAGNOSIS — Z7901 Long term (current) use of anticoagulants: Secondary | ICD-10-CM | POA: Diagnosis not present

## 2014-12-13 DIAGNOSIS — Z7409 Other reduced mobility: Secondary | ICD-10-CM | POA: Diagnosis present

## 2014-12-13 DIAGNOSIS — I10 Essential (primary) hypertension: Secondary | ICD-10-CM | POA: Diagnosis not present

## 2014-12-13 DIAGNOSIS — Z86711 Personal history of pulmonary embolism: Secondary | ICD-10-CM | POA: Diagnosis not present

## 2014-12-13 DIAGNOSIS — G8222 Paraplegia, incomplete: Secondary | ICD-10-CM | POA: Diagnosis present

## 2014-12-13 DIAGNOSIS — D62 Acute posthemorrhagic anemia: Secondary | ICD-10-CM | POA: Diagnosis not present

## 2014-12-13 DIAGNOSIS — R000 Tachycardia, unspecified: Secondary | ICD-10-CM | POA: Diagnosis not present

## 2014-12-13 DIAGNOSIS — R4189 Other symptoms and signs involving cognitive functions and awareness: Secondary | ICD-10-CM | POA: Diagnosis not present

## 2014-12-13 DIAGNOSIS — Z5189 Encounter for other specified aftercare: Secondary | ICD-10-CM | POA: Diagnosis not present

## 2014-12-13 DIAGNOSIS — R609 Edema, unspecified: Secondary | ICD-10-CM | POA: Diagnosis present

## 2014-12-13 DIAGNOSIS — I959 Hypotension, unspecified: Secondary | ICD-10-CM | POA: Diagnosis present

## 2014-12-13 DIAGNOSIS — R2681 Unsteadiness on feet: Secondary | ICD-10-CM | POA: Diagnosis present

## 2014-12-13 DIAGNOSIS — I2692 Saddle embolus of pulmonary artery without acute cor pulmonale: Secondary | ICD-10-CM | POA: Diagnosis not present

## 2014-12-13 DIAGNOSIS — Z981 Arthrodesis status: Secondary | ICD-10-CM | POA: Diagnosis not present

## 2014-12-13 DIAGNOSIS — K592 Neurogenic bowel, not elsewhere classified: Secondary | ICD-10-CM | POA: Diagnosis not present

## 2014-12-22 ENCOUNTER — Ambulatory Visit: Payer: Medicare Other | Admitting: Family Medicine

## 2014-12-22 ENCOUNTER — Telehealth: Payer: Self-pay | Admitting: Family Medicine

## 2014-12-22 DIAGNOSIS — G959 Disease of spinal cord, unspecified: Secondary | ICD-10-CM | POA: Diagnosis not present

## 2014-12-22 DIAGNOSIS — I2692 Saddle embolus of pulmonary artery without acute cor pulmonale: Secondary | ICD-10-CM | POA: Diagnosis not present

## 2014-12-22 DIAGNOSIS — Z4789 Encounter for other orthopedic aftercare: Secondary | ICD-10-CM | POA: Diagnosis not present

## 2014-12-22 DIAGNOSIS — G8222 Paraplegia, incomplete: Secondary | ICD-10-CM | POA: Diagnosis not present

## 2014-12-22 DIAGNOSIS — M4800 Spinal stenosis, site unspecified: Secondary | ICD-10-CM | POA: Diagnosis not present

## 2014-12-22 DIAGNOSIS — M5135 Other intervertebral disc degeneration, thoracolumbar region: Secondary | ICD-10-CM | POA: Diagnosis not present

## 2014-12-22 NOTE — Telephone Encounter (Signed)
Would agree that she should be seen earlier (than September) ideally but that is her choice ultimately.

## 2014-12-22 NOTE — Telephone Encounter (Signed)
Left detailed message on machine for patient 

## 2014-12-22 NOTE — Telephone Encounter (Signed)
Pt was recently d/c'd from the hospital for blood clots in her legs. Pt has surgery done on her legs the week of 12/02/14 at [facility]. Pt was in hospital for almost 8 wks and d/c'd 12/21/14. She wants to wait unitl Sept, when Dr. Sherren Mocha returns to discuss everything with her. I told her that someone may want to see her sooner based on everything that is going on with her. Please advise.

## 2014-12-26 DIAGNOSIS — M5135 Other intervertebral disc degeneration, thoracolumbar region: Secondary | ICD-10-CM | POA: Diagnosis not present

## 2014-12-26 DIAGNOSIS — G8222 Paraplegia, incomplete: Secondary | ICD-10-CM | POA: Diagnosis not present

## 2014-12-26 DIAGNOSIS — I2692 Saddle embolus of pulmonary artery without acute cor pulmonale: Secondary | ICD-10-CM | POA: Diagnosis not present

## 2014-12-26 DIAGNOSIS — M4800 Spinal stenosis, site unspecified: Secondary | ICD-10-CM | POA: Diagnosis not present

## 2014-12-26 DIAGNOSIS — G959 Disease of spinal cord, unspecified: Secondary | ICD-10-CM | POA: Diagnosis not present

## 2014-12-26 DIAGNOSIS — Z4789 Encounter for other orthopedic aftercare: Secondary | ICD-10-CM | POA: Diagnosis not present

## 2014-12-27 DIAGNOSIS — G8222 Paraplegia, incomplete: Secondary | ICD-10-CM | POA: Diagnosis not present

## 2014-12-27 DIAGNOSIS — M4800 Spinal stenosis, site unspecified: Secondary | ICD-10-CM | POA: Diagnosis not present

## 2014-12-27 DIAGNOSIS — I2692 Saddle embolus of pulmonary artery without acute cor pulmonale: Secondary | ICD-10-CM | POA: Diagnosis not present

## 2014-12-27 DIAGNOSIS — M5135 Other intervertebral disc degeneration, thoracolumbar region: Secondary | ICD-10-CM | POA: Diagnosis not present

## 2014-12-27 DIAGNOSIS — Z4789 Encounter for other orthopedic aftercare: Secondary | ICD-10-CM | POA: Diagnosis not present

## 2014-12-27 DIAGNOSIS — G959 Disease of spinal cord, unspecified: Secondary | ICD-10-CM | POA: Diagnosis not present

## 2014-12-29 DIAGNOSIS — Z4789 Encounter for other orthopedic aftercare: Secondary | ICD-10-CM | POA: Diagnosis not present

## 2014-12-29 DIAGNOSIS — M5135 Other intervertebral disc degeneration, thoracolumbar region: Secondary | ICD-10-CM | POA: Diagnosis not present

## 2014-12-29 DIAGNOSIS — G959 Disease of spinal cord, unspecified: Secondary | ICD-10-CM | POA: Diagnosis not present

## 2014-12-29 DIAGNOSIS — I2692 Saddle embolus of pulmonary artery without acute cor pulmonale: Secondary | ICD-10-CM | POA: Diagnosis not present

## 2014-12-29 DIAGNOSIS — G8222 Paraplegia, incomplete: Secondary | ICD-10-CM | POA: Diagnosis not present

## 2014-12-29 DIAGNOSIS — M4800 Spinal stenosis, site unspecified: Secondary | ICD-10-CM | POA: Diagnosis not present

## 2014-12-30 ENCOUNTER — Encounter: Payer: Self-pay | Admitting: Family Medicine

## 2014-12-30 ENCOUNTER — Ambulatory Visit (INDEPENDENT_AMBULATORY_CARE_PROVIDER_SITE_OTHER): Payer: Medicare Other | Admitting: Family Medicine

## 2014-12-30 VITALS — BP 130/76 | HR 83 | Temp 97.6°F | Wt 171.0 lb

## 2014-12-30 DIAGNOSIS — I10 Essential (primary) hypertension: Secondary | ICD-10-CM | POA: Diagnosis not present

## 2014-12-30 DIAGNOSIS — R5383 Other fatigue: Secondary | ICD-10-CM | POA: Diagnosis not present

## 2014-12-30 DIAGNOSIS — E041 Nontoxic single thyroid nodule: Secondary | ICD-10-CM

## 2014-12-30 DIAGNOSIS — K5900 Constipation, unspecified: Secondary | ICD-10-CM

## 2014-12-30 DIAGNOSIS — I2699 Other pulmonary embolism without acute cor pulmonale: Secondary | ICD-10-CM

## 2014-12-30 DIAGNOSIS — I2692 Saddle embolus of pulmonary artery without acute cor pulmonale: Secondary | ICD-10-CM | POA: Diagnosis not present

## 2014-12-30 DIAGNOSIS — D62 Acute posthemorrhagic anemia: Secondary | ICD-10-CM

## 2014-12-30 DIAGNOSIS — G2581 Restless legs syndrome: Secondary | ICD-10-CM | POA: Diagnosis not present

## 2014-12-30 DIAGNOSIS — G959 Disease of spinal cord, unspecified: Secondary | ICD-10-CM | POA: Diagnosis not present

## 2014-12-30 DIAGNOSIS — M4800 Spinal stenosis, site unspecified: Secondary | ICD-10-CM | POA: Diagnosis not present

## 2014-12-30 DIAGNOSIS — Z4789 Encounter for other orthopedic aftercare: Secondary | ICD-10-CM | POA: Diagnosis not present

## 2014-12-30 DIAGNOSIS — G8222 Paraplegia, incomplete: Secondary | ICD-10-CM | POA: Diagnosis not present

## 2014-12-30 DIAGNOSIS — M5135 Other intervertebral disc degeneration, thoracolumbar region: Secondary | ICD-10-CM | POA: Diagnosis not present

## 2014-12-30 LAB — COMPREHENSIVE METABOLIC PANEL
ALBUMIN: 4.3 g/dL (ref 3.5–5.2)
ALT: 13 U/L (ref 0–35)
AST: 17 U/L (ref 0–37)
Alkaline Phosphatase: 83 U/L (ref 39–117)
BUN: 15 mg/dL (ref 6–23)
CHLORIDE: 100 meq/L (ref 96–112)
CO2: 26 mEq/L (ref 19–32)
Calcium: 10.2 mg/dL (ref 8.4–10.5)
Creatinine, Ser: 0.81 mg/dL (ref 0.40–1.20)
GFR: 73.48 mL/min (ref >60.00)
Glucose, Bld: 103 mg/dL — ABNORMAL HIGH (ref 70–99)
Potassium: 3.8 mEq/L (ref 3.5–5.1)
Sodium: 137 mEq/L (ref 135–145)
Total Bilirubin: 0.6 mg/dL (ref 0.2–1.2)
Total Protein: 7.4 g/dL (ref 6.0–8.3)

## 2014-12-30 LAB — CBC WITH DIFFERENTIAL/PLATELET
Basophils Absolute: 0.1 10*3/uL (ref 0.0–0.1)
Basophils Relative: 0.6 % (ref 0.0–3.0)
EOS PCT: 9.7 % — AB (ref 0.0–5.0)
Eosinophils Absolute: 0.8 10*3/uL — ABNORMAL HIGH (ref 0.0–0.7)
HEMATOCRIT: 37.5 % (ref 36.0–46.0)
HEMOGLOBIN: 12.3 g/dL (ref 12.0–15.0)
LYMPHS ABS: 3 10*3/uL (ref 0.7–4.0)
LYMPHS PCT: 35.7 % (ref 12.0–46.0)
MCHC: 32.7 g/dL (ref 30.0–36.0)
MCV: 86.1 fl (ref 78.0–100.0)
MONO ABS: 0.5 10*3/uL (ref 0.1–1.0)
Monocytes Relative: 5.3 % (ref 3.0–12.0)
NEUTROS ABS: 4.1 10*3/uL (ref 1.4–7.7)
Neutrophils Relative %: 48.7 % (ref 43.0–77.0)
Platelets: 450 10*3/uL — ABNORMAL HIGH (ref 150.0–400.0)
RBC: 4.35 Mil/uL (ref 3.87–5.11)
RDW: 15.6 % — ABNORMAL HIGH (ref 11.5–15.5)
WBC: 8.4 10*3/uL (ref 4.0–10.5)

## 2014-12-30 MED ORDER — RIVAROXABAN 20 MG PO TABS: 20.0000 mg | ORAL_TABLET | Freq: Every day | ORAL | Status: DC

## 2014-12-30 NOTE — Progress Notes (Signed)
Pre visit review using our clinic review tool, if applicable. No additional management support is needed unless otherwise documented below in the visit note. 

## 2014-12-30 NOTE — Patient Instructions (Signed)
Constipation  Constipation is when a person has fewer than three bowel movements a week, has difficulty having a bowel movement, or has stools that are dry, hard, or larger than normal. As people grow older, constipation is more common. If you try to fix constipation with medicines that make you have a bowel movement (laxatives), the problem may get worse. Long-term laxative use may cause the muscles of the colon to become weak. A low-fiber diet, not taking in enough fluids, and taking certain medicines may make constipation worse.   CAUSES   · Certain medicines, such as antidepressants, pain medicine, iron supplements, antacids, and water pills.    · Certain diseases, such as diabetes, irritable bowel syndrome (IBS), thyroid disease, or depression.    · Not drinking enough water.    · Not eating enough fiber-rich foods.    · Stress or travel.    · Lack of physical activity or exercise.    · Ignoring the urge to have a bowel movement.    · Using laxatives too much.    SIGNS AND SYMPTOMS   · Having fewer than three bowel movements a week.    · Straining to have a bowel movement.    · Having stools that are hard, dry, or larger than normal.    · Feeling full or bloated.    · Pain in the lower abdomen.    · Not feeling relief after having a bowel movement.    DIAGNOSIS   Your health care provider will take a medical history and perform a physical exam. Further testing may be done for severe constipation. Some tests may include:  · A barium enema X-ray to examine your rectum, colon, and, sometimes, your small intestine.    · A sigmoidoscopy to examine your lower colon.    · A colonoscopy to examine your entire colon.  TREATMENT   Treatment will depend on the severity of your constipation and what is causing it. Some dietary treatments include drinking more fluids and eating more fiber-rich foods. Lifestyle treatments may include regular exercise. If these diet and lifestyle recommendations do not help, your health care  provider may recommend taking over-the-counter laxative medicines to help you have bowel movements. Prescription medicines may be prescribed if over-the-counter medicines do not work.   HOME CARE INSTRUCTIONS   · Eat foods that have a lot of fiber, such as fruits, vegetables, whole grains, and beans.  · Limit foods high in fat and processed sugars, such as french fries, hamburgers, cookies, candies, and soda.    · A fiber supplement may be added to your diet if you cannot get enough fiber from foods.    · Drink enough fluids to keep your urine clear or pale yellow.    · Exercise regularly or as directed by your health care provider.    · Go to the restroom when you have the urge to go. Do not hold it.    · Only take over-the-counter or prescription medicines as directed by your health care provider. Do not take other medicines for constipation without talking to your health care provider first.    SEEK IMMEDIATE MEDICAL CARE IF:   · You have bright red blood in your stool.    · Your constipation lasts for more than 4 days or gets worse.    · You have abdominal or rectal pain.    · You have thin, pencil-like stools.    · You have unexplained weight loss.  MAKE SURE YOU:   · Understand these instructions.  · Will watch your condition.  · Will get help right away if you are not   you have with your health care provider.  Try increasing Mirapex to 1-1/2 tablets at night We will call you regarding thyroid ultrasound

## 2014-12-30 NOTE — Progress Notes (Signed)
Subjective:    Patient ID: Felicia Cruz, female    DOB: 06-30-1940, 74 y.o.   MRN: [id number]  HPI Patient is here following long hospitalization at [facility]. Multiple hospital studies and discharge notes reviewed. She was apparently admitted for over 3 weeks. 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 She had had prior history of pulmonary embolus over year ago that was treated with 6 months of anticoagulation but she was not taking that at the time of her surgery. She was discharged on Xarelto-15 mg twice daily.  Multiple issues are addressed regarding her recent hospitalization as below:  Large pulmonary embolus as above. 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 She apparently had vena cava filter short-term. Discharged on Xarelto 15 mg twice daily and she has been on this for over 21 days has not yet transitioned to 20 mg. She has had no bleeding complications  Anemia with hemoglobin 9.4 at discharge. Likely postsurgical Bass preoperative hemoglobin normal range. She has increased fatigue issues.  Thyroid nodule 1.4 cm noted incidentally on CT scan. She was not aware of this. She has not noted any thyroid masses  Restless leg syndrome. Currently takes Mirapex and lorazepam at night. Still has poor control symptoms. Possibly exacerbated by recent post surgical anemia  Constipation. She's tried multiple things including stool softeners, MiraLAX, prune juice, and enemas without much improvement. She thinks part of her lack of formed stools is her lack of intake (food). She's had poor appetite. No bloody stools. Denies any abdominal bloating or any nausea or vomiting.  Reviewed:   Past Medical History  Diagnosis Date  . RLS (restless legs syndrome)   . Esophageal stricture 2010  . Osteopenia   . Hyperlipemia   . Arthritis   . Kidney stones   . GERD (gastroesophageal reflux disease)   . Substance abuse       RECOVERING ALCOLHOLIC  . Alcoholism     HAS NOT HAD [place]  . Cataract   . 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 Hiatal hernia   . DDD (degenerative disc disease), lumbar   . Chronic back pain    Past Surgical History  Procedure Laterality Date  . Lumbar disc surgery  2008  . Facial cosmetic surgery      eyes, nose  . Abdominoplasty    . Liposuction  15 to 20 yrs ago  . Surgery for kidney stones    . Right index finger surgery  06/19/11    AT [facility]  . Laparoscopic nissen fundoplication  3/74/8270    Procedure: LAPAROSCOPIC NISSEN FUNDOPLICATION;  Surgeon: Pedro Earls, MD;  Location: [facility];  Service: General;  Laterality: N/A;  Laparoscopic Nissen repair of hiatal hernia  with lighted bougie  . Esophageal manometry  02/17/2012    Procedure: ESOPHAGEAL MANOMETRY (EM);  Surgeon: Pedro Earls, MD;  Location: [facility];  Service: Endoscopy;  Laterality: N/A;  . Dilation and curettage of uterus  many yrs ago    x 2  . 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 Joint replacement      left  . Open surgical repair of gluteal tendon Right 12/28/2012    Procedure: RIGHT GLUTEAL TENDON REPAIR;  Surgeon: Mauri Pole, MD;  Location:  [facility];  Service: Orthopedics;  Laterality: Right;    reports that she quit smoking about 43 years ago. Her smoking use included Cigarettes. She has a 10 pack-year smoking history. She has never used smokeless tobacco. She reports that she does not drink alcohol or use illicit drugs. family history includes Alzheimer's disease in her mother; Heart disease in her mother; Hip fracture in her mother; Hypertension in her mother; Stroke in her mother; Stroke (age of onset: 52) in her brother; Throat cancer in her father. Allergies  Allergen Reactions  . Gabapentin Other (See  Comments)    Chest pain  . Lipitor [Atorvastatin]     Memory loss, confusion, hallucinations  . Lyrica [Pregabalin] Other (See Comments)    Water blisters on face  . Penicillins Hives, Shortness Of Breath and Itching  . Latex Other (See Comments)    Skin peeling (thumb)  . Methocarbamol Other (See Comments)    Blisters on face      Review of Systems  Constitutional: Positive for appetite change and fatigue. Negative for fever and chills.  HENT: Negative for trouble swallowing.   Eyes: Negative for visual disturbance.  Respiratory: Negative for cough, chest tightness, shortness of breath and wheezing.   Cardiovascular: Negative for chest pain, palpitations and leg swelling.  Gastrointestinal: Positive for constipation. Negative for nausea, vomiting, abdominal pain, blood in stool and abdominal distention.  Endocrine: Negative for polydipsia and polyuria.  Genitourinary: Negative for dysuria.  Musculoskeletal: Negative for back pain.  Skin: Negative for rash.  Neurological: Negative for dizziness, seizures, syncope, weakness, light-headedness and headaches.  Hematological: Negative for adenopathy. Does not bruise/bleed easily.  Psychiatric/Behavioral: Negative for confusion and dysphoric mood.       Objective:   Physical Exam  Constitutional: She is oriented to person, place, and time. She appears well-developed and well-nourished.  HENT:  Right Ear: External ear normal.  Left Ear: External ear normal.  Mouth/Throat: Oropharynx is clear and moist.  Neck: Neck supple. No thyromegaly present.  Cardiovascular: Normal rate and regular rhythm.   Pulmonary/Chest: Effort normal and breath sounds normal. No respiratory distress. She has no wheezes. She has no rales.  Abdominal: Soft. Bowel sounds are normal. She exhibits no distension and no mass. There is no tenderness. There is no rebound and no guarding.  Musculoskeletal: She exhibits no edema.  Lymphadenopathy:    She has no  cervical adenopathy.  Neurological: She is alert and oriented to person, place, and time. No cranial nerve deficit.  Psychiatric: She has a normal mood and affect. Her behavior is normal.          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 This makes second episode of pulmonary embolus and she will be on chronic anticoagulation. At this point she has been for over one month on dosage of 15 mg twice daily for Xarelto. Transition to 20 mg once daily-prescription given. #2 restless leg syndrome with symptoms poorly controlled. Try increasing her Mirapex to one half tablets at night. Avoid caffeine at night.  Recent anemia (post-surgical) may be exacerbating. #3 constipation. Increase fluid intake. Continue MiraLAX as needed. #4 postoperative anemia. Recheck CBC #5 thyroid nodule left thyroid noted incidentally on CT. Set up ultrasound for further assessment

## 2015-01-02 DIAGNOSIS — Z4789 Encounter for other orthopedic aftercare: Secondary | ICD-10-CM | POA: Diagnosis not present

## 2015-01-02 DIAGNOSIS — G959 Disease of spinal cord, unspecified: Secondary | ICD-10-CM | POA: Diagnosis not present

## 2015-01-02 DIAGNOSIS — G8222 Paraplegia, incomplete: Secondary | ICD-10-CM | POA: Diagnosis not present

## 2015-01-02 DIAGNOSIS — I2692 Saddle embolus of pulmonary artery without acute cor pulmonale: Secondary | ICD-10-CM | POA: Diagnosis not present

## 2015-01-02 DIAGNOSIS — M4800 Spinal stenosis, site unspecified: Secondary | ICD-10-CM | POA: Diagnosis not present

## 2015-01-02 DIAGNOSIS — M5135 Other intervertebral disc degeneration, thoracolumbar region: Secondary | ICD-10-CM | POA: Diagnosis not present

## 2015-01-04 DIAGNOSIS — M4800 Spinal stenosis, site unspecified: Secondary | ICD-10-CM | POA: Diagnosis not present

## 2015-01-04 DIAGNOSIS — I2692 Saddle embolus of pulmonary artery without acute cor pulmonale: Secondary | ICD-10-CM | POA: Diagnosis not present

## 2015-01-04 DIAGNOSIS — G8222 Paraplegia, incomplete: Secondary | ICD-10-CM | POA: Diagnosis not present

## 2015-01-04 DIAGNOSIS — G959 Disease of spinal cord, unspecified: Secondary | ICD-10-CM | POA: Diagnosis not present

## 2015-01-04 DIAGNOSIS — Z4789 Encounter for other orthopedic aftercare: Secondary | ICD-10-CM | POA: Diagnosis not present

## 2015-01-04 DIAGNOSIS — M5135 Other intervertebral disc degeneration, thoracolumbar region: Secondary | ICD-10-CM | POA: Diagnosis not present

## 2015-01-05 ENCOUNTER — Ambulatory Visit
Admission: RE | Admit: 2015-01-05 | Discharge: 2015-01-05 | Disposition: A | Discharge status: Home / Self Care | Payer: Medicare Other | Source: Ambulatory Visit | Attending: Family Medicine | Admitting: Family Medicine

## 2015-01-05 DIAGNOSIS — E041 Nontoxic single thyroid nodule: Secondary | ICD-10-CM

## 2015-01-05 DIAGNOSIS — E042 Nontoxic multinodular goiter: Secondary | ICD-10-CM | POA: Diagnosis not present

## 2015-01-09 ENCOUNTER — Telehealth: Payer: Self-pay | Admitting: *Deleted

## 2015-01-09 ENCOUNTER — Other Ambulatory Visit: Payer: Self-pay | Admitting: Family Medicine

## 2015-01-09 DIAGNOSIS — G959 Disease of spinal cord, unspecified: Secondary | ICD-10-CM | POA: Diagnosis not present

## 2015-01-09 DIAGNOSIS — I2692 Saddle embolus of pulmonary artery without acute cor pulmonale: Secondary | ICD-10-CM | POA: Diagnosis not present

## 2015-01-09 DIAGNOSIS — G8222 Paraplegia, incomplete: Secondary | ICD-10-CM | POA: Diagnosis not present

## 2015-01-09 DIAGNOSIS — M4800 Spinal stenosis, site unspecified: Secondary | ICD-10-CM | POA: Diagnosis not present

## 2015-01-09 DIAGNOSIS — M5135 Other intervertebral disc degeneration, thoracolumbar region: Secondary | ICD-10-CM | POA: Diagnosis not present

## 2015-01-09 DIAGNOSIS — E041 Nontoxic single thyroid nodule: Secondary | ICD-10-CM

## 2015-01-09 DIAGNOSIS — Z4789 Encounter for other orthopedic aftercare: Secondary | ICD-10-CM | POA: Diagnosis not present

## 2015-01-09 NOTE — Telephone Encounter (Signed)
Patient is aware of results and order was placed

## 2015-01-12 DIAGNOSIS — G959 Disease of spinal cord, unspecified: Secondary | ICD-10-CM | POA: Diagnosis not present

## 2015-01-12 DIAGNOSIS — M5135 Other intervertebral disc degeneration, thoracolumbar region: Secondary | ICD-10-CM | POA: Diagnosis not present

## 2015-01-12 DIAGNOSIS — I2692 Saddle embolus of pulmonary artery without acute cor pulmonale: Secondary | ICD-10-CM | POA: Diagnosis not present

## 2015-01-12 DIAGNOSIS — Z86711 Personal history of pulmonary embolism: Secondary | ICD-10-CM | POA: Diagnosis not present

## 2015-01-12 DIAGNOSIS — M4800 Spinal stenosis, site unspecified: Secondary | ICD-10-CM | POA: Diagnosis not present

## 2015-01-12 DIAGNOSIS — Z981 Arthrodesis status: Secondary | ICD-10-CM | POA: Diagnosis not present

## 2015-01-12 DIAGNOSIS — Z4789 Encounter for other orthopedic aftercare: Secondary | ICD-10-CM | POA: Diagnosis not present

## 2015-01-12 DIAGNOSIS — G8222 Paraplegia, incomplete: Secondary | ICD-10-CM | POA: Diagnosis not present

## 2015-01-16 ENCOUNTER — Ambulatory Visit (HOSPITAL_COMMUNITY)
Admission: RE | Admit: 2015-01-16 | Discharge: 2015-01-16 | Disposition: A | Discharge status: Home / Self Care | Payer: Medicare Other | Source: Ambulatory Visit | Attending: Family Medicine | Admitting: Family Medicine

## 2015-01-16 DIAGNOSIS — E041 Nontoxic single thyroid nodule: Secondary | ICD-10-CM | POA: Insufficient documentation

## 2015-01-16 MED ORDER — LIDOCAINE HCL (PF) 1 % IJ SOLN
INTRAMUSCULAR | Status: AC
  Filled 2015-01-16: qty 10

## 2015-01-16 NOTE — Procedures (Signed)
Successful US guided left thyroid nodule FNA No complications.  Ascencion Dike PA-C Interventional Radiology 01/16/2015 1:41 PM

## 2015-01-19 ENCOUNTER — Ambulatory Visit: Payer: Medicare Other | Admitting: Rehabilitation

## 2015-01-24 ENCOUNTER — Ambulatory Visit: Payer: Medicare Other | Attending: Neurological Surgery | Admitting: Physical Therapy

## 2015-01-24 ENCOUNTER — Encounter: Payer: Self-pay | Admitting: Physical Therapy

## 2015-01-24 ENCOUNTER — Encounter: Payer: Self-pay | Admitting: *Deleted

## 2015-01-24 DIAGNOSIS — M545 Low back pain, unspecified: Secondary | ICD-10-CM

## 2015-01-24 DIAGNOSIS — R262 Difficulty in walking, not elsewhere classified: Secondary | ICD-10-CM | POA: Insufficient documentation

## 2015-01-24 DIAGNOSIS — M21372 Foot drop, left foot: Secondary | ICD-10-CM | POA: Diagnosis not present

## 2015-01-24 NOTE — Patient Instructions (Signed)
Ankle Dorsiflexion: Long-Sitting (Single Leg)   Sit facing anchor, tubing around forefoot, pull toes back toward head. Repeat 10__ times per set. Repeat with other leg. Do _2_ sets per session. Do _2_ sessions per day.  Plantarflexion (Eccentric), (Resistance Band)   Point foot down against resistance band. Slowly release for 3-5 seconds. 10___ reps per set, __2_ sets, _2__ times per day.  Ankle Eversion: Long-Sitting   Loop tubing around feet just below toes, legs separated as far as tolerated, toes pointed inward. Rotate ankles, pointing toes outward. Repeat 10__ times per set. Do _2_ sets per session. Do _2_ sessions per day.  Inversion (Eccentric), (Resistance Band)   Pull foot in against resistance band. Slowly release for 3-5 seconds. _10__ reps per set, __2_ sets, _2__ times per day.

## 2015-01-24 NOTE — Therapy (Signed)
[place] [address] [place], [place], [address] Phone: [phone]   Fax:  [phone]  Physical Therapy Evaluation  Patient Details  Name: Felicia Cruz MRN: [id number] Date of Birth: [birth year] Referring Provider:  Atilano Ina, MD  Encounter Date: 01/24/2015      PT End of Session - 01/24/15 1645    Visit Number 1   Date for PT Re-Evaluation 03/26/15   PT Start Time 1555   PT Stop Time 1656   PT Time Calculation (min) 61 min   Equipment Utilized During Treatment Gait belt   Activity Tolerance Patient tolerated treatment well   Behavior During Therapy [facility] for tasks assessed/performed      Past Medical History  Diagnosis Date  . RLS (restless legs syndrome)   . Esophageal stricture 2010  . Osteopenia   . Hyperlipemia   . Arthritis   . Kidney stones   . GERD (gastroesophageal reflux disease)   . Substance abuse     RECOVERING ALCOLHOLIC  . Alcoholism     HAS NOT HAD [place]  . Cataract   . 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 Hiatal hernia   . DDD (degenerative disc disease), lumbar   . Chronic back pain     Past Surgical History  Procedure Laterality Date  . Lumbar disc surgery  2008  . Facial cosmetic surgery      eyes, nose  . Abdominoplasty    . Liposuction  15 to 20 yrs ago  . Surgery for kidney stones    . Right index finger surgery  06/19/11    AT [facility]  . Laparoscopic nissen fundoplication  5/46/2703    Procedure: LAPAROSCOPIC NISSEN FUNDOPLICATION;  Surgeon: Pedro Earls, MD;  [facility];  Service: General;  Laterality: N/A;  Laparoscopic Nissen repair of hiatal hernia  with lighted bougie  . Esophageal manometry  02/17/2012    Procedure: ESOPHAGEAL MANOMETRY (EM);  Surgeon: Pedro Earls, MD;  [facility];  Service: Endoscopy;  Laterality: N/A;  . Dilation and curettage of uterus  many yrs ago    x 2  .  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 Joint replacement      left  . Open surgical repair of gluteal tendon Right 12/28/2012    Procedure: RIGHT GLUTEAL TENDON REPAIR;  Surgeon: Mauri Pole, MD;  [facility];  Service: Orthopedics;  Laterality: Right;    There were no vitals filed for this visit.  Visit Diagnosis:  Midline low back pain without sciatica - Plan: PT plan of care cert/re-cert  Difficulty walking - Plan: PT plan of care cert/re-cert  Foot drop, left - Plan: PT plan of care cert/re-cert      Subjective Assessment - 01/24/15 1559    Subjective Patient reports that on 11/16/14 she underwent a lumbar fusion with rods and screws.  Then had a pulmonary embolism 4 days later, she reports that she was inthe hospital for 8 weeks.  Had surgery for the PE.  She reports that she had home PT until last week.   Limitations Lifting;Standing;Walking;House hold activities   How long can you stand comfortably? 5 minutes   How long can you walk comfortably? 150 feet   Patient Stated Goals walk without walker  and have less pain   Currently in Pain? Yes   Pain Score 4    Pain Location Back   Pain Orientation Mid;Lower   Pain Descriptors / Indicators Aching   Pain Type Chronic pain   Pain Radiating Towards reports left leg is numb from surgery   Pain Onset More than a month ago   Pain Frequency Constant   Aggravating Factors  walking pain will go up to 9-10/10   Pain Relieving Factors lying down with pain meds, pain will be down to 3-4/10   Effect of Pain on Daily Activities limits everything            [facility] PT Assessment - 01/24/15 0001    Assessment   Medical Diagnosis lumbar fusion   Onset Date/Surgical Date 11/16/14   Next MD Visit May 18, 2015   Prior Therapy home PT   Precautions   Precautions None   Balance Screen   Has the  patient fallen in the past 6 months No   Has the patient had a decrease in activity level because of a fear of falling?  No   Is the patient reluctant to leave their home because of a fear of falling?  No   Home [employer] residence   Additional Comments currently having help with all houswework   Prior Function   Level of Independence Independent   Leisure was exercising   AROM   Overall AROM Comments hips and knees WFL's, left ankle AROM DF was 12 degrees from neutral in long sitting, inversion and eversion minimal motions   Strength   Overall Strength Comments right LE 4/5, left LE 4-/5, left ankle 1/5   Palpation   Palpation comment long scar through lumbar area from thoracis to sacral area   Ambulation/Gait   Gait Comments uses a FWW, slow, left drop foot, some trendelenberg gait on the right, uses a lot of arms to steady self, without the walker and using HHA she has much more difficulty with a lot more hip motions side to side    Standardized Balance Assessment   Standardized Balance Assessment Timed Up and Go Test   Timed Up and Go Test   Normal TUG (seconds) 34                   [facility] Adult PT Treatment/Exercise - 01/24/15 0001    Lumbar Exercises: Aerobic   Elliptical NuStep Level 4 x 5 minutes   Ankle Exercises: Seated   Other Seated Ankle Exercises sit fit ankle motions with assist, yellow tband ankle exercises with a lot of cues                  PT Short Term Goals - 01/24/15 1649    PT SHORT TERM GOAL #1   Title independent with initial HEP   Time 2   Period Weeks   Status New           PT Long Term Goals - 01/24/15 1650    PT LONG TERM GOAL #1   Title decrease Tug time to 15 seconds   Time 12   Period Weeks   Status New   PT LONG TERM GOAL #2   Title decrease pain 50%   Time 12   Period Weeks   Status New   PT LONG TERM GOAL #3   Title walk with a SPC x 200 feet   Time 12   Period Weeks  Status New   PT LONG TERM GOAL #4   Title demonstrate active DF to 0 degrees   Time 12   Period Weeks   Status New               Plan - 02/17/15 1646    Clinical Impression Statement Patient had a right hip tendon repair about 2 years ago that never really got better, she ended up having a multi level lumbar fusion in June, now has drop foot on the left side, gait is with a FWW, trendelenberg on the right and a drop foot on the left, has back pain   Pt will benefit from skilled therapeutic intervention in order to improve on the following deficits Abnormal gait;Decreased activity tolerance;Decreased balance;Decreased mobility;Decreased range of motion;Difficulty walking;Decreased strength;Increased muscle spasms;Pain   Rehab Potential Good   PT Frequency 2x / week   PT Duration 12 weeks   PT Treatment/Interventions Electrical Stimulation;Moist Heat;Cryotherapy;Gait training;Functional mobility training;Therapeutic activities;Therapeutic exercise;Balance training;Neuromuscular re-education;Patient/family education;Manual techniques   PT Next Visit Plan may try VMS on the left anterior tibialis   Consulted and Agree with Plan of Care Patient          G-Codes - 02/17/2015 1753    Functional Assessment Tool Used foto   Functional Limitation Mobility: Walking and moving around   Mobility: Walking and Moving Around Current Status [phone]) At least 60 percent but less than 80 percent impaired, limited or restricted   Mobility: Walking and Moving Around Goal Status [phone]) At least 40 percent but less than 60 percent impaired, limited or restricted       Problem List Patient Active Problem List   Diagnosis Date Noted  . Postoperative anemia due to acute blood loss 12/30/2014  . Essential hypertension, benign 07/04/2014  . Accelerated hypertension 05/05/2014  . Wheezing 03/30/2014  . Pulmonary embolism 10/14/2013  . Acute respiratory failure 10/14/2013  . Chest pain 10/14/2013  .  Anemia 10/14/2013  . PE (pulmonary embolism) 10/14/2013  . Urinary frequency 04/07/2013  . Urinary incontinence 04/07/2013  . Right gluteus tear 12/28/2012  . Generalized anxiety disorder 08/19/2012  . Unspecified constipation 08/19/2012  . GERD (gastroesophageal reflux disease) 08/19/2012  . Overweight (BMI 25.0-29.9) 08/11/2012  . Expected blood loss anemia 08/11/2012  . Knee pain, left 03/31/2012  . Depression 09/10/2011  . S/P Nissen fundoplication (without gastrostomy tube) procedure 07/26/2011  . Chronic cough 01/09/2011  . RESTLESS LEG SYNDROME 05/05/2008  . HYPERLIPIDEMIA 09/14/2007  . Osteopenia 09/14/2007    Sumner Boast., PT Feb 17, 2015, 5:53 PM  [address] [place], [place], [address] Phone: [phone]   Fax:  [phone]

## 2015-01-30 ENCOUNTER — Encounter: Payer: Self-pay | Admitting: Physical Therapy

## 2015-01-30 ENCOUNTER — Ambulatory Visit: Payer: Medicare Other | Admitting: Physical Therapy

## 2015-01-30 DIAGNOSIS — M545 Low back pain, unspecified: Secondary | ICD-10-CM

## 2015-01-30 DIAGNOSIS — R262 Difficulty in walking, not elsewhere classified: Secondary | ICD-10-CM

## 2015-01-30 DIAGNOSIS — M21372 Foot drop, left foot: Secondary | ICD-10-CM

## 2015-01-30 NOTE — Therapy (Signed)
[place], [place], [address] Phone: [phone]   Fax:  [phone]  Physical Therapy Treatment  Patient Details  Name: Felicia Cruz MRN: [id number] Date of Birth: [birth year] Referring Provider:  Atilano Ina, MD  Encounter Date: 01/30/2015      PT End of Session - 01/30/15 0959    Visit Number 2   Date for PT Re-Evaluation 03/26/15   PT Start Time 0851   PT Stop Time 0935   PT Time Calculation (min) 44 min   Activity Tolerance Patient tolerated treatment well   Behavior During Therapy [facility] for tasks assessed/performed      Past Medical History  Diagnosis Date  . RLS (restless legs syndrome)   . Esophageal stricture 2010  . Osteopenia   . Hyperlipemia   . Arthritis   . Kidney stones   . GERD (gastroesophageal reflux disease)   . Substance abuse     RECOVERING ALCOLHOLIC  . Alcoholism     HAS NOT HAD [place]  . Cataract   . 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 Hiatal hernia   . DDD (degenerative disc disease), lumbar   . Chronic back pain     Past Surgical History  Procedure Laterality Date  . Lumbar disc surgery  2008  . Facial cosmetic surgery      eyes, nose  . Abdominoplasty    . Liposuction  15 to 20 yrs ago  . Surgery for kidney stones    . Right index finger surgery  06/19/11    AT [facility]  . Laparoscopic nissen fundoplication  4/91/7915    Procedure: LAPAROSCOPIC NISSEN FUNDOPLICATION;  Surgeon: Pedro Earls, MD;  [facility];  Service: General;  Laterality: N/A;  Laparoscopic Nissen repair of hiatal hernia  with lighted bougie  . Esophageal manometry  02/17/2012    Procedure: ESOPHAGEAL MANOMETRY (EM);  Surgeon: Pedro Earls, MD;  [facility];  Service: Endoscopy;  Laterality: N/A;  . Dilation and curettage of uterus  many yrs ago    x 2  . Total knee arthroplasty Left 08/10/2012    Procedure:  TOTAL KNEE ARTHROPLASTY;  Surgeon: Mauri Pole, MD;  [facility];  Service: Orthopedics;  Laterality: Left;  . Joint replacement      left  . Open surgical repair of gluteal tendon Right 12/28/2012    Procedure: RIGHT GLUTEAL TENDON REPAIR;  Surgeon: Mauri Pole, MD;  [facility];  Service: Orthopedics;  Laterality: Right;    There were no vitals filed for this visit.  Visit Diagnosis:  Midline low back pain without sciatica  Difficulty walking  Foot drop, left                       [facility] Adult PT Treatment/Exercise - 01/30/15 0001    Transfers   Comments Worked on problem solving and performing walking to car, folding walker up, opening doors and getting in and out of car, a lot of cues needed, but able to perform   Ambulation/Gait   Gait Comments used a FWW, 180 feet x 2   High Level Balance   High Level Balance Activities Side stepping;Backward walking   Lumbar Exercises: Aerobic   Elliptical NuStep Level 4 x 5 minutes   Knee/Hip Exercises: Sidelying   Hip ABduction 2 sets;10 reps   Hip ABduction Limitations some assist  needed for the right hip used tband to assist   Clams 2x15 each side   Other Sidelying Knee/Hip Exercises Supine bridges                  PT Short Term Goals - 01/24/15 1649    PT SHORT TERM GOAL #1   Title independent with initial HEP   Time 2   Period Weeks   Status New           PT Long Term Goals - 01/24/15 1650    PT LONG TERM GOAL #1   Title decrease Tug time to 15 seconds   Time 12   Period Weeks   Status New   PT LONG TERM GOAL #2   Title decrease pain 50%   Time 12   Period Weeks   Status New   PT LONG TERM GOAL #3   Title walk with a SPC x 200 feet   Time 12   Period Weeks   Status New   PT LONG TERM GOAL #4   Title demonstrate active DF to 0 degrees   Time 12   Period Weeks   Status New               Plan - 01/30/15 1000    Clinical Impression Statement Very weak right  hip and drop foot on the left foot is causing very poor gait, unable to walk without walker.   PT Next Visit Plan may try VMS on the left anterior tibialis   Consulted and Agree with Plan of Care Patient        Problem List Patient Active Problem List   Diagnosis Date Noted  . Postoperative anemia due to acute blood loss 12/30/2014  . Essential hypertension, benign 07/04/2014  . Accelerated hypertension 05/05/2014  . Wheezing 03/30/2014  . Pulmonary embolism 10/14/2013  . Acute respiratory failure 10/14/2013  . Chest pain 10/14/2013  . Anemia 10/14/2013  . PE (pulmonary embolism) 10/14/2013  . Urinary frequency 04/07/2013  . Urinary incontinence 04/07/2013  . Right gluteus tear 12/28/2012  . Generalized anxiety disorder 08/19/2012  . Unspecified constipation 08/19/2012  . GERD (gastroesophageal reflux disease) 08/19/2012  . Overweight (BMI 25.0-29.9) 08/11/2012  . Expected blood loss anemia 08/11/2012  . Knee pain, left 03/31/2012  . Depression 09/10/2011  . S/P Nissen fundoplication (without gastrostomy tube) procedure 07/26/2011  . Chronic cough 01/09/2011  . RESTLESS LEG SYNDROME 05/05/2008  . HYPERLIPIDEMIA 09/14/2007  . Osteopenia 09/14/2007    Sumner Boast., PT 01/30/2015, 10:02 AM  [address] [place], [place], [address] Phone: [phone]   Fax:  [phone]

## 2015-02-01 ENCOUNTER — Ambulatory Visit: Payer: Medicare Other | Admitting: Physical Therapy

## 2015-02-01 ENCOUNTER — Encounter: Payer: Self-pay | Admitting: Physical Therapy

## 2015-02-01 DIAGNOSIS — M545 Low back pain, unspecified: Secondary | ICD-10-CM

## 2015-02-01 DIAGNOSIS — R262 Difficulty in walking, not elsewhere classified: Secondary | ICD-10-CM | POA: Diagnosis not present

## 2015-02-01 DIAGNOSIS — M21372 Foot drop, left foot: Secondary | ICD-10-CM | POA: Diagnosis not present

## 2015-02-01 NOTE — Therapy (Signed)
[place], [place], [address] Phone: [phone]   Fax:  [phone]  Physical Therapy Treatment  Patient Details  Name: Felicia Cruz MRN: [id number] Date of Birth: [birth year] Referring Provider:  Atilano Ina, MD  Encounter Date: 02/01/2015      PT End of Session - 02/01/15 1346    Visit Number 3   Date for PT Re-Evaluation 03/26/15   PT Start Time 4401   PT Stop Time 1347   PT Time Calculation (min) 50 min   Activity Tolerance Patient tolerated treatment well   Behavior During Therapy [facility] for tasks assessed/performed      Past Medical History  Diagnosis Date  . RLS (restless legs syndrome)   . Esophageal stricture 2010  . Osteopenia   . Hyperlipemia   . Arthritis   . Kidney stones   . GERD (gastroesophageal reflux disease)   . Substance abuse     RECOVERING ALCOLHOLIC  . Alcoholism     HAS NOT HAD [place]  . Cataract   . 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 Hiatal hernia   . DDD (degenerative disc disease), lumbar   . Chronic back pain     Past Surgical History  Procedure Laterality Date  . Lumbar disc surgery  2008  . Facial cosmetic surgery      eyes, nose  . Abdominoplasty    . Liposuction  15 to 20 yrs ago  . Surgery for kidney stones    . Right index finger surgery  06/19/11    AT [facility]  . Laparoscopic nissen fundoplication  0/27/2536    Procedure: LAPAROSCOPIC NISSEN FUNDOPLICATION;  Surgeon: Pedro Earls, MD;  [facility];  Service: General;  Laterality: N/A;  Laparoscopic Nissen repair of hiatal hernia  with lighted bougie  . Esophageal manometry  02/17/2012    Procedure: ESOPHAGEAL MANOMETRY (EM);  Surgeon: Pedro Earls, MD;  [facility];  Service: Endoscopy;  Laterality: N/A;  . Dilation and curettage of uterus  many yrs ago    x 2  . Total knee arthroplasty Left 08/10/2012    Procedure:  TOTAL KNEE ARTHROPLASTY;  Surgeon: Mauri Pole, MD;  [facility];  Service: Orthopedics;  Laterality: Left;  . Joint replacement      left  . Open surgical repair of gluteal tendon Right 12/28/2012    Procedure: RIGHT GLUTEAL TENDON REPAIR;  Surgeon: Mauri Pole, MD;  [facility];  Service: Orthopedics;  Laterality: Right;    There were no vitals filed for this visit.  Visit Diagnosis:  Midline low back pain without sciatica  Difficulty walking  Foot drop, left      Subjective Assessment - 02/01/15 1303    Subjective I am moving a little better.  No pain.   Currently in Pain? No/denies                         [facility] Adult PT Treatment/Exercise - 02/01/15 0001    High Level Balance   High Level Balance Activities Side stepping;Backward walking   Lumbar Exercises: Aerobic   Elliptical NuStep Level 5 x 6 minutes   Knee/Hip Exercises: Machines for Strengthening   Cybex Knee Extension 5# 2x10   Cybex Knee Flexion 25# 2x15   Cybex Leg Press 20# 2x10   Other Machine on sit fit pelvic mobility and  stability exercises   Knee/Hip Exercises: Sidelying   Hip ABduction 2 sets;10 reps   Hip ABduction Limitations some assist needed for the right hip used tband to assist   Clams 2x15 each side   Other Sidelying Knee/Hip Exercises feet on ball K2C, rotation and bridges, isometric abdominal ball crunch   Ankle Exercises: Seated   Other Seated Ankle Exercises sit fit ankle motions with assist, yellow tband ankle exercises with a lot of cues                  PT Short Term Goals - 01/24/15 1649    PT SHORT TERM GOAL #1   Title independent with initial HEP   Time 2   Period Weeks   Status New           PT Long Term Goals - 01/24/15 1650    PT LONG TERM GOAL #1   Title decrease Tug time to 15 seconds   Time 12   Period Weeks   Status New   PT LONG TERM GOAL #2   Title decrease pain 50%   Time 12   Period Weeks   Status New   PT LONG  TERM GOAL #3   Title walk with a SPC x 200 feet   Time 12   Period Weeks   Status New   PT LONG TERM GOAL #4   Title demonstrate active DF to 0 degrees   Time 12   Period Weeks   Status New               Plan - 02/01/15 1347    Clinical Impression Statement Weak right hip and weak left ankle.  Poor gait.  Really has to rely on the walker to be mobile   PT Next Visit Plan continue to work on strength and functional gait   Consulted and Agree with Plan of Care Patient        Problem List Patient Active Problem List   Diagnosis Date Noted  . Postoperative anemia due to acute blood loss 12/30/2014  . Essential hypertension, benign 07/04/2014  . Accelerated hypertension 05/05/2014  . Wheezing 03/30/2014  . Pulmonary embolism 10/14/2013  . Acute respiratory failure 10/14/2013  . Chest pain 10/14/2013  . Anemia 10/14/2013  . PE (pulmonary embolism) 10/14/2013  . Urinary frequency 04/07/2013  . Urinary incontinence 04/07/2013  . Right gluteus tear 12/28/2012  . Generalized anxiety disorder 08/19/2012  . Unspecified constipation 08/19/2012  . GERD (gastroesophageal reflux disease) 08/19/2012  . Overweight (BMI 25.0-29.9) 08/11/2012  . Expected blood loss anemia 08/11/2012  . Knee pain, left 03/31/2012  . Depression 09/10/2011  . S/P Nissen fundoplication (without gastrostomy tube) procedure 07/26/2011  . Chronic cough 01/09/2011  . RESTLESS LEG SYNDROME 05/05/2008  . HYPERLIPIDEMIA 09/14/2007  . Osteopenia 09/14/2007    Sumner Boast., PT 02/01/2015, 1:48 PM  [address] [place], [place], [address] Phone: [phone]   Fax:  [phone]

## 2015-02-08 ENCOUNTER — Encounter: Payer: Self-pay | Admitting: Physical Therapy

## 2015-02-08 ENCOUNTER — Ambulatory Visit (INDEPENDENT_AMBULATORY_CARE_PROVIDER_SITE_OTHER): Payer: Medicare Other | Admitting: Family Medicine

## 2015-02-08 ENCOUNTER — Encounter: Payer: Self-pay | Admitting: Family Medicine

## 2015-02-08 ENCOUNTER — Ambulatory Visit: Payer: Medicare Other | Attending: Neurological Surgery | Admitting: Physical Therapy

## 2015-02-08 VITALS — BP 110/80 | Temp 97.7°F | Wt 173.0 lb

## 2015-02-08 DIAGNOSIS — Z23 Encounter for immunization: Secondary | ICD-10-CM | POA: Diagnosis not present

## 2015-02-08 DIAGNOSIS — N3941 Urge incontinence: Secondary | ICD-10-CM | POA: Diagnosis not present

## 2015-02-08 DIAGNOSIS — M545 Low back pain, unspecified: Secondary | ICD-10-CM

## 2015-02-08 DIAGNOSIS — M21372 Foot drop, left foot: Secondary | ICD-10-CM | POA: Insufficient documentation

## 2015-02-08 DIAGNOSIS — G2581 Restless legs syndrome: Secondary | ICD-10-CM | POA: Diagnosis not present

## 2015-02-08 DIAGNOSIS — R262 Difficulty in walking, not elsewhere classified: Secondary | ICD-10-CM | POA: Diagnosis not present

## 2015-02-08 MED ORDER — OXYBUTYNIN CHLORIDE 5 MG PO TABS: ORAL_TABLET | ORAL | Status: DC

## 2015-02-08 MED ORDER — ROPINIROLE HCL 0.5 MG PO TABS
ORAL_TABLET | ORAL | Status: DC
Start: 2015-02-08 — End: 2016-04-02

## 2015-02-08 NOTE — Progress Notes (Signed)
   Subjective:    Patient ID: Felicia Cruz, female    DOB: 1941-05-22, 74 y.o.   MRN: [id number]  HPI Felicia Cruz is a 74 year old widowed female nonsmoker who comes in today for general evaluation prior to moving to a condominium at [facility] in [place].  She had extensive back surgery in June 16. Her hardware removed removed new hardware was put in with rods and cages. She was hospitalized for 7 weeks. However postop was complicated by pulmonary embolus. She is on chronic anticoagulation at this juncture.  Her meds were reviewed in detail.  She has urinary urgency since surgery. She would like to discuss treatment options  She also takes Mirapex 2 mg in the morning and 2 mg in the evening for restless leg syndrome it doesn't seem to be helping she would like to talk about other options. Her other meds and her med list were reviewed. She does not take the Tylenol. Only takes the albuterol when necessary. Ativan 1 mg at bedtime, Hyzaar 1 tablet daily in the morning, Prilosec 20 mg twice a day, [name] is her blood thinner 20 mg daily with her supper.  She gets routine eye care, dental care, colonoscopy and mammogram recently were all both normal.  She is accompanied today by her caregiver from [place]. The caregivers returning to [place] care will be on her own until the condominium is completed in November.  Vaccinations up-to-date   Review of Systems Review of systems otherwise negative except she thinks it'll take 1218 months prior to walk without a walker    Objective:   Physical Exam  Well-developed well-nourished female no acute distress vital signs stable she's afebrile      Assessment & Plan:  Status post extensive back surgery June 2016 at [place] care by pulmonary embolus......... now on continuous anticoagulation...Marland KitchenMarland KitchenMarland Kitchen long-term physical therapy to rehabilitation her back  Urinary urgency with leakage........... trial of  Ditropan  Restless leg syndrome.......... stop the Mirapex........ trial of Requip  Hypertension ago continue current therapy  Sleep dysfunction continue Ativan 1 mg at bedtime  Chronic reflux esophagitis continue Prilosec 20 mg twice a day

## 2015-02-08 NOTE — Progress Notes (Signed)
Pre visit review using our clinic review tool, if applicable. No additional management support is needed unless otherwise documented below in the visit note. 

## 2015-02-08 NOTE — Therapy (Signed)
[place], [place], [address] Phone: [phone]   Fax:  [phone]  Physical Therapy Treatment  Patient Details  Name: Felicia Cruz MRN: [id number] Date of Birth: [birth year] Referring Provider:  Atilano Ina, MD  Encounter Date: 02/08/2015      PT End of Session - 02/08/15 1346    Visit Number 4   PT Start Time 8177   PT Stop Time 1346   PT Time Calculation (min) 49 min   Activity Tolerance Patient tolerated treatment well   Behavior During Therapy [facility] for tasks assessed/performed      Past Medical History  Diagnosis Date  . RLS (restless legs syndrome)   . Esophageal stricture 2010  . Osteopenia   . Hyperlipemia   . Arthritis   . Kidney stones   . GERD (gastroesophageal reflux disease)   . Substance abuse     RECOVERING ALCOLHOLIC  . Alcoholism     HAS NOT HAD [place]  . Cataract   . 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 Hiatal hernia   . DDD (degenerative disc disease), lumbar   . Chronic back pain     Past Surgical History  Procedure Laterality Date  . Lumbar disc surgery  2008  . Facial cosmetic surgery      eyes, nose  . Abdominoplasty    . Liposuction  15 to 20 yrs ago  . Surgery for kidney stones    . Right index finger surgery  06/19/11    AT [facility]  . Laparoscopic nissen fundoplication  06/18/5788    Procedure: LAPAROSCOPIC NISSEN FUNDOPLICATION;  Surgeon: Pedro Earls, MD;  [facility];  Service: General;  Laterality: N/A;  Laparoscopic Nissen repair of hiatal hernia  with lighted bougie  . Esophageal manometry  02/17/2012    Procedure: ESOPHAGEAL MANOMETRY (EM);  Surgeon: Pedro Earls, MD;  [facility];  Service: Endoscopy;  Laterality: N/A;  . Dilation and curettage of uterus  many yrs ago    x 2  . Total knee arthroplasty Left 08/10/2012    Procedure: TOTAL KNEE ARTHROPLASTY;  Surgeon:  Mauri Pole, MD;  [facility];  Service: Orthopedics;  Laterality: Left;  . Joint replacement      left  . Open surgical repair of gluteal tendon Right 12/28/2012    Procedure: RIGHT GLUTEAL TENDON REPAIR;  Surgeon: Mauri Pole, MD;  [facility];  Service: Orthopedics;  Laterality: Right;    There were no vitals filed for this visit.  Visit Diagnosis:  Midline low back pain without sciatica  Difficulty walking  Foot drop, left      Subjective Assessment - 02/08/15 1301    Subjective Tired, [place].   Currently in Pain? No/denies                         [facility] Adult PT Treatment/Exercise - 02/08/15 0001    Ambulation/Gait   Gait Comments HHA x 200 feet working on bigger steps, faster speed and decreasing the trunk lean and trendelenberg gait pattern   High Level Balance   High Level Balance Activities Side stepping;Backward walking;Marching forwards   Lumbar Exercises: Aerobic   Elliptical NuStep Level 5 x 6 minutes   Knee/Hip Exercises: Machines for Strengthening   Cybex Knee Extension 5# 3x10   Cybex Knee  Flexion 25# 2x15, then 15# single legs   Cybex Leg Press 40# 2x10, left and right individual with 20# x 10 each, then no weight x 10 each leg   Other Machine on sit fit pelvic mobility and stability exercises   Knee/Hip Exercises: Sidelying   Hip ABduction 2 sets;10 reps   Hip ABduction Limitations some assist needed for the right hip used tband to assist   Clams 2x15 each side   Other Sidelying Knee/Hip Exercises feet on ball K2C, rotation and bridges, isometric abdominal ball crunch   Manual Therapy   Manual therapy comments PROM HS and piriformis                  PT Short Term Goals - 01/24/15 1649    PT SHORT TERM GOAL #1   Title independent with initial HEP   Time 2   Period Weeks   Status New           PT Long Term Goals - 02/08/15 1347    PT LONG TERM GOAL #1   Title decrease  Tug time to 15 seconds   Status On-going   PT LONG TERM GOAL #2   Title decrease pain 50%   Status Partially Met               Plan - 02/08/15 1346    Clinical Impression Statement Continues to have difficulty due to weakness in both hips.  Needs to continue with strengthening   PT Next Visit Plan continue to work on strength and functional gait   Consulted and Agree with Plan of Care Patient        Problem List Patient Active Problem List   Diagnosis Date Noted  . Postoperative anemia due to acute blood loss 12/30/2014  . Essential hypertension, benign 07/04/2014  . Accelerated hypertension 05/05/2014  . Wheezing 03/30/2014  . Pulmonary embolism 10/14/2013  . Acute respiratory failure 10/14/2013  . Chest pain 10/14/2013  . Anemia 10/14/2013  . PE (pulmonary embolism) 10/14/2013  . Urinary frequency 04/07/2013  . Urinary incontinence 04/07/2013  . Right gluteus tear 12/28/2012  . Generalized anxiety disorder 08/19/2012  . Unspecified constipation 08/19/2012  . GERD (gastroesophageal reflux disease) 08/19/2012  . Overweight (BMI 25.0-29.9) 08/11/2012  . Expected blood loss anemia 08/11/2012  . Knee pain, left 03/31/2012  . Depression 09/10/2011  . S/P Nissen fundoplication (without gastrostomy tube) procedure 07/26/2011  . Chronic cough 01/09/2011  . RESTLESS LEG SYNDROME 05/05/2008  . HYPERLIPIDEMIA 09/14/2007  . Osteopenia 09/14/2007    Sumner Boast., PT 02/08/2015, 1:48 PM  [place] [address] [place], [place], [address] Phone: [phone]   Fax:  [phone]

## 2015-02-08 NOTE — Patient Instructions (Signed)
Stop the Mirapex,,,,,,,,, begin Requip 0.5 mg,,,,,,,,, one tablet 2 hours prior to bedtime for 1 week then increase the dose to 2 tabs 2 hours prior to bedtime  Follow-up in 3 weeks  For the urinary incontinence,,,,,,,,,, Dittrich pan 5 mg,,,,,,,,,, one tablet daily in the morning  Continue other medications

## 2015-02-10 ENCOUNTER — Encounter: Payer: Self-pay | Admitting: Physical Therapy

## 2015-02-10 ENCOUNTER — Ambulatory Visit: Payer: Medicare Other | Admitting: Physical Therapy

## 2015-02-10 DIAGNOSIS — R262 Difficulty in walking, not elsewhere classified: Secondary | ICD-10-CM

## 2015-02-10 DIAGNOSIS — M21372 Foot drop, left foot: Secondary | ICD-10-CM | POA: Diagnosis not present

## 2015-02-10 DIAGNOSIS — M545 Low back pain, unspecified: Secondary | ICD-10-CM

## 2015-02-10 NOTE — Therapy (Signed)
[facility] [address] [place], [place], [address] Phone: [phone]   Fax:  [phone]  Physical Therapy Treatment  Patient Details  Name: Felicia Cruz MRN: [id number] Date of Birth: 1940-10-01 Referring Provider:  Diamantina Providence, MD  Encounter Date: 02/10/2015      PT End of Session - 02/10/15 1015    Visit Number 5   Date for PT Re-Evaluation 03/26/15   PT Start Time 0925   PT Stop Time 1015   PT Time Calculation (min) 50 min   Activity Tolerance Treatment limited secondary to agitation   Behavior During Therapy [facility] for tasks assessed/performed      Past Medical History  Diagnosis Date  . RLS (restless legs syndrome)   . Esophageal stricture 2010  . Osteopenia   . Hyperlipemia   . Arthritis   . Kidney stones   . GERD (gastroesophageal reflux disease)   . Substance abuse     RECOVERING ALCOLHOLIC  . Alcoholism     HAS NOT HAD DRINK SINCE 1995  . Cataract   . 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 Hiatal hernia   . DDD (degenerative disc disease), lumbar   . Chronic back pain     Past Surgical History  Procedure Laterality Date  . Lumbar disc surgery  2008  . Facial cosmetic surgery      eyes, nose  . Abdominoplasty    . Liposuction  15 to 20 yrs ago  . Surgery for kidney stones    . Right index finger surgery  06/19/11    AT [facility]  . Laparoscopic nissen fundoplication  06/26/2011    Procedure: LAPAROSCOPIC NISSEN FUNDOPLICATION;  Surgeon: Valarie Merino, MD;  [facility];  Service: General;  Laterality: N/A;  Laparoscopic Nissen repair of hiatal hernia  with lighted bougie  . Esophageal manometry  02/17/2012    Procedure: ESOPHAGEAL MANOMETRY (EM);  Surgeon: Valarie Merino, MD;  [facility];  Service: Endoscopy;  Laterality: N/A;  . Dilation and curettage of uterus  many yrs ago    x 2  . Total knee arthroplasty Left 08/10/2012   Procedure: TOTAL KNEE ARTHROPLASTY;  Surgeon: Shelda Pal, MD;  [facility];  Service: Orthopedics;  Laterality: Left;  . Joint replacement      left  . Open surgical repair of gluteal tendon Right 12/28/2012    Procedure: RIGHT GLUTEAL TENDON REPAIR;  Surgeon: Shelda Pal, MD;  [facility];  Service: Orthopedics;  Laterality: Right;    There were no vitals filed for this visit.  Visit Diagnosis:  Midline low back pain without sciatica  Difficulty walking  Foot drop, left      Subjective Assessment - 02/10/15 0933    Subjective My legs were really sore and hurting after last time   Currently in Pain? Yes   Pain Score 4    Pain Location Back   Aggravating Factors  walking   Pain Relieving Factors rest                         [facility] Adult PT Treatment/Exercise - 02/10/15 0001    Ambulation/Gait   Gait Comments HHA x 200 feet working on bigger steps, faster speed and decreasing the trunk lean and trendelenberg gait pattern   High Level Balance   High Level Balance Activities Side stepping;Backward walking;Marching forwards  Lumbar Exercises: Aerobic   Elliptical NuStep Level 5 x 6 minutes   Knee/Hip Exercises: Machines for Strengthening   Cybex Knee Extension 5# 3x10   Cybex Knee Flexion 25# 2x15, then 15# single legs   Cybex Leg Press 40# 2x10, left and right individual with 20# x 10 each, then no weight x 10 each leg   Other Machine on sit fit pelvic mobility and stability exercises   Knee/Hip Exercises: Sidelying   Hip ABduction 2 sets;10 reps   Hip ABduction Limitations some assist needed for the right hip used tband to assist   Clams 2x15 each side   Other Sidelying Knee/Hip Exercises feet on ball K2C, rotation and bridges, isometric abdominal ball crunch   Manual Therapy   Manual therapy comments PROM HS and piriformis                  PT Short Term Goals - 01/24/15 1649    PT SHORT TERM GOAL #1   Title independent with  initial HEP   Time 2   Period Weeks   Status New           PT Long Term Goals - 02/08/15 1347    PT LONG TERM GOAL #1   Title decrease Tug time to 15 seconds   Status On-going   PT LONG TERM GOAL #2   Title decrease pain 50%   Status Partially Met               Plan - 02/10/15 1016    Clinical Impression Statement Pateint very sore int he legs after last treatment, very weak hips, unable to get left anterior tib to fire   PT Next Visit Plan continue to work on strength and functional gait   Consulted and Agree with Plan of Care Patient        Problem List Patient Active Problem List   Diagnosis Date Noted  . Postoperative anemia due to acute blood loss 12/30/2014  . Essential hypertension, benign 07/04/2014  . Accelerated hypertension 05/05/2014  . Wheezing 03/30/2014  . Pulmonary embolism 10/14/2013  . Acute respiratory failure 10/14/2013  . Chest pain 10/14/2013  . Anemia 10/14/2013  . PE (pulmonary embolism) 10/14/2013  . Urinary frequency 04/07/2013  . Urinary incontinence 04/07/2013  . Right gluteus tear 12/28/2012  . Generalized anxiety disorder 08/19/2012  . Unspecified constipation 08/19/2012  . GERD (gastroesophageal reflux disease) 08/19/2012  . Overweight (BMI 25.0-29.9) 08/11/2012  . Expected blood loss anemia 08/11/2012  . Knee pain, left 03/31/2012  . Depression 09/10/2011  . S/P Nissen fundoplication (without gastrostomy tube) procedure 07/26/2011  . Chronic cough 01/09/2011  . RESTLESS LEG SYNDROME 05/05/2008  . HYPERLIPIDEMIA 09/14/2007  . Osteopenia 09/14/2007    Sumner Boast., PT 02/10/2015, 10:17 AM  [address] [place], [place], [address] Phone: [phone]   Fax:  [phone]

## 2015-02-13 ENCOUNTER — Encounter: Payer: Self-pay | Admitting: Physical Therapy

## 2015-02-13 ENCOUNTER — Ambulatory Visit: Payer: Medicare Other | Admitting: Physical Therapy

## 2015-02-13 DIAGNOSIS — R262 Difficulty in walking, not elsewhere classified: Secondary | ICD-10-CM

## 2015-02-13 DIAGNOSIS — M545 Low back pain, unspecified: Secondary | ICD-10-CM

## 2015-02-13 DIAGNOSIS — M21372 Foot drop, left foot: Secondary | ICD-10-CM

## 2015-02-13 NOTE — Therapy (Signed)
[place], [place], [address] Phone: [phone]   Fax:  [phone]  Physical Therapy Treatment  Patient Details  Name: Felicia Cruz MRN: [id number] Date of Birth: 12/06/1940 Referring Provider:  Atilano Ina, MD  Encounter Date: 02/13/2015      PT End of Session - 02/13/15 1429    Visit Number 6   Date for PT Re-Evaluation 03/26/15   PT Start Time 1350   PT Stop Time 1500   PT Time Calculation (min) 70 min   Activity Tolerance Patient limited by fatigue   Behavior During Therapy [facility] for tasks assessed/performed      Past Medical History  Diagnosis Date  . RLS (restless legs syndrome)   . Esophageal stricture 2010  . Osteopenia   . Hyperlipemia   . Arthritis   . Kidney stones   . GERD (gastroesophageal reflux disease)   . Substance abuse     RECOVERING ALCOLHOLIC  . Alcoholism     HAS NOT HAD [place]  . Cataract   . 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 Hiatal hernia   . DDD (degenerative disc disease), lumbar   . Chronic back pain     Past Surgical History  Procedure Laterality Date  . Lumbar disc surgery  2008  . Facial cosmetic surgery      eyes, nose  . Abdominoplasty    . Liposuction  15 to 20 yrs ago  . Surgery for kidney stones    . Right index finger surgery  06/19/11    AT [facility]  . Laparoscopic nissen fundoplication  1/60/7371    Procedure: LAPAROSCOPIC NISSEN FUNDOPLICATION;  Surgeon: Pedro Earls, MD;  [facility];  Service: General;  Laterality: N/A;  Laparoscopic Nissen repair of hiatal hernia  with lighted bougie  . Esophageal manometry  02/17/2012    Procedure: ESOPHAGEAL MANOMETRY (EM);  Surgeon: Pedro Earls, MD;  [facility];  Service: Endoscopy;  Laterality: N/A;  . Dilation and curettage of uterus  many yrs ago    x 2  . Total knee arthroplasty Left 08/10/2012    Procedure: TOTAL  KNEE ARTHROPLASTY;  Surgeon: Mauri Pole, MD;  [facility];  Service: Orthopedics;  Laterality: Left;  . Joint replacement      left  . Open surgical repair of gluteal tendon Right 12/28/2012    Procedure: RIGHT GLUTEAL TENDON REPAIR;  Surgeon: Mauri Pole, MD;  [facility];  Service: Orthopedics;  Laterality: Right;    There were no vitals filed for this visit.  Visit Diagnosis:  Midline low back pain without sciatica  Difficulty walking  Foot drop, left      Subjective Assessment - 02/13/15 1406    Subjective I was pretty sore after the last time.  But my leg really felt good with you trying the electrical stuff on my shin.   Currently in Pain? Yes   Pain Score 3    Pain Location Back   Pain Orientation Lower   Pain Descriptors / Indicators Aching   Pain Type Chronic pain                         [facility] Adult PT Treatment/Exercise - 02/13/15 0001    High Level Balance   High Level Balance Activities Side stepping;Backward walking;Marching forwards;Tandem walking   Lumbar Exercises: Aerobic  Elliptical NuStep Level 5 x 6 minutes   Knee/Hip Exercises: Machines for Strengthening   Cybex Knee Extension 5# 3x10   Cybex Knee Flexion 25# 2x15, then 15# single legs   Cybex Leg Press 40# 2x10, left and right individual with 20# x 10 each, then no weight x 10 each leg   Knee/Hip Exercises: Sidelying   Hip ABduction 2 sets;10 reps   Hip ABduction Limitations some assist needed for the right hip used tband to assist   Hip ADduction Limitations with ball   Other Sidelying Knee/Hip Exercises feet on ball K2C, rotation and bridges, isometric abdominal ball crunch, 15# seated rows and lats   Electrical Stimulation   Electrical Stimulation Location left shin   Electrical Stimulation Action VMS   Electrical Stimulation Parameters 10 on/10 off   Electrical Stimulation Goals Neuromuscular facilitation                  PT Short Term Goals -  01/24/15 1649    PT SHORT TERM GOAL #1   Title independent with initial HEP   Time 2   Period Weeks   Status New           PT Long Term Goals - 02/08/15 1347    PT LONG TERM GOAL #1   Title decrease Tug time to 15 seconds   Status On-going   PT LONG TERM GOAL #2   Title decrease pain 50%   Status Partially Met               Plan - 02/13/15 1431    Clinical Impression Statement fatigues easily, very weak right hip and left anterior tibialis.  Gait is poor with FWW   PT Next Visit Plan continue to work on strength and functional gait   Consulted and Agree with Plan of Care Patient        Problem List Patient Active Problem List   Diagnosis Date Noted  . Postoperative anemia due to acute blood loss 12/30/2014  . Essential hypertension, benign 07/04/2014  . Accelerated hypertension 05/05/2014  . Wheezing 03/30/2014  . Pulmonary embolism 10/14/2013  . Acute respiratory failure 10/14/2013  . Chest pain 10/14/2013  . Anemia 10/14/2013  . PE (pulmonary embolism) 10/14/2013  . Urinary frequency 04/07/2013  . Urinary incontinence 04/07/2013  . Right gluteus tear 12/28/2012  . Generalized anxiety disorder 08/19/2012  . Unspecified constipation 08/19/2012  . GERD (gastroesophageal reflux disease) 08/19/2012  . Overweight (BMI 25.0-29.9) 08/11/2012  . Expected blood loss anemia 08/11/2012  . Knee pain, left 03/31/2012  . Depression 09/10/2011  . S/P Nissen fundoplication (without gastrostomy tube) procedure 07/26/2011  . Chronic cough 01/09/2011  . RESTLESS LEG SYNDROME 05/05/2008  . HYPERLIPIDEMIA 09/14/2007  . Osteopenia 09/14/2007    Sumner Boast., PT 02/13/2015, 2:39 PM  [place] [address] [place], [place], [address] Phone: [phone]   Fax:  [phone]

## 2015-02-16 ENCOUNTER — Encounter: Payer: Self-pay | Admitting: Physical Therapy

## 2015-02-16 ENCOUNTER — Ambulatory Visit: Payer: Medicare Other | Admitting: Physical Therapy

## 2015-02-16 DIAGNOSIS — M21372 Foot drop, left foot: Secondary | ICD-10-CM | POA: Diagnosis not present

## 2015-02-16 DIAGNOSIS — R262 Difficulty in walking, not elsewhere classified: Secondary | ICD-10-CM

## 2015-02-16 DIAGNOSIS — M545 Low back pain, unspecified: Secondary | ICD-10-CM

## 2015-02-16 NOTE — Therapy (Signed)
[place], [place], [address] Phone: [phone]   Fax:  [phone]  Physical Therapy Treatment  Patient Details  Name: Felicia Cruz MRN: [id number] Date of Birth: 08/03/1940 Referring Provider:  Atilano Ina, MD  Encounter Date: 02/16/2015      PT End of Session - 02/16/15 1427    Visit Number 7   Date for PT Re-Evaluation 03/26/15   PT Start Time 8264   PT Stop Time 1455   PT Time Calculation (min) 58 min   Activity Tolerance Patient limited by fatigue   Behavior During Therapy [facility] for tasks assessed/performed      Past Medical History  Diagnosis Date  . RLS (restless legs syndrome)   . Esophageal stricture 2010  . Osteopenia   . Hyperlipemia   . Arthritis   . Kidney stones   . GERD (gastroesophageal reflux disease)   . Substance abuse     RECOVERING ALCOLHOLIC  . Alcoholism     HAS NOT HAD [place]  . Cataract   . 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 Hiatal hernia   . DDD (degenerative disc disease), lumbar   . Chronic back pain     Past Surgical History  Procedure Laterality Date  . Lumbar disc surgery  2008  . Facial cosmetic surgery      eyes, nose  . Abdominoplasty    . Liposuction  15 to 20 yrs ago  . Surgery for kidney stones    . Right index finger surgery  06/19/11    AT [facility]  . Laparoscopic nissen fundoplication  1/58/3094    Procedure: LAPAROSCOPIC NISSEN FUNDOPLICATION;  Surgeon: Pedro Earls, MD;  [facility];  Service: General;  Laterality: N/A;  Laparoscopic Nissen repair of hiatal hernia  with lighted bougie  . Esophageal manometry  02/17/2012    Procedure: ESOPHAGEAL MANOMETRY (EM);  Surgeon: Pedro Earls, MD;  [facility];  Service: Endoscopy;  Laterality: N/A;  . Dilation and curettage of uterus  many yrs ago    x 2  . Total knee arthroplasty Left 08/10/2012    Procedure: TOTAL  KNEE ARTHROPLASTY;  Surgeon: Mauri Pole, MD;  [facility];  Service: Orthopedics;  Laterality: Left;  . Joint replacement      left  . Open surgical repair of gluteal tendon Right 12/28/2012    Procedure: RIGHT GLUTEAL TENDON REPAIR;  Surgeon: Mauri Pole, MD;  [facility];  Service: Orthopedics;  Laterality: Right;    There were no vitals filed for this visit.  Visit Diagnosis:  Midline low back pain without sciatica  Difficulty walking  Foot drop, left      Subjective Assessment - 02/16/15 1403    Subjective I walked to get the mail today with walkier for the first time, felt pretty good doing it.  I am very sore still after exercises.   Currently in Pain? Yes   Pain Score 3    Pain Location Back   Pain Orientation Lower   Pain Descriptors / Indicators Aching   Aggravating Factors  standing and walking   Pain Relieving Factors rest                         [facility] Adult PT Treatment/Exercise - 02/16/15 0001    High Level Balance   High Level Balance Activities Side  stepping;Backward walking;Marching forwards;Tandem walking   Lumbar Exercises: Aerobic   Elliptical NuStep Level 5 x 6 minutes   Knee/Hip Exercises: Machines for Strengthening   Cybex Knee Extension 5# 3x10   Cybex Knee Flexion 25# 2x15, then 15# single legs   Cybex Leg Press 40# 2x10, left and right individual with 20# x 10 each, then no weight x 10 each leg   Other Machine seated rows 20#, lats 20#   Knee/Hip Exercises: Sidelying   Hip ABduction 2 sets;10 reps   Hip ABduction Limitations some assist needed for the right hip used tband to assist   Clams 2x15 each side   Other Sidelying Knee/Hip Exercises ball b/n knees squeeze   Other Sidelying Knee/Hip Exercises feet on ball K2C, rotation and bridges, isometric abdominal ball crunch, 15# seated rows and lats   Electrical Stimulation   Electrical Stimulation Location left shin   Electrical Stimulation Action Engineer, petroleum Parameters 5 on 5 off   Electrical Stimulation Goals Neuromuscular facilitation                  PT Short Term Goals - 02/16/15 1429    PT SHORT TERM GOAL #1   Title independent with initial HEP   Status Achieved           PT Long Term Goals - 02/08/15 1347    PT LONG TERM GOAL #1   Title decrease Tug time to 15 seconds   Status On-going   PT LONG TERM GOAL #2   Title decrease pain 50%   Status Partially Met               Plan - 02/16/15 1428    Clinical Impression Statement Very weak right hip, poor gait without walker, does well with walker   PT Next Visit Plan continue to work on strength and functional gait   Consulted and Agree with Plan of Care Patient        Problem List Patient Active Problem List   Diagnosis Date Noted  . Postoperative anemia due to acute blood loss 12/30/2014  . Essential hypertension, benign 07/04/2014  . Accelerated hypertension 05/05/2014  . Wheezing 03/30/2014  . Pulmonary embolism 10/14/2013  . Acute respiratory failure 10/14/2013  . Chest pain 10/14/2013  . Anemia 10/14/2013  . PE (pulmonary embolism) 10/14/2013  . Urinary frequency 04/07/2013  . Urinary incontinence 04/07/2013  . Right gluteus tear 12/28/2012  . Generalized anxiety disorder 08/19/2012  . Unspecified constipation 08/19/2012  . GERD (gastroesophageal reflux disease) 08/19/2012  . Overweight (BMI 25.0-29.9) 08/11/2012  . Expected blood loss anemia 08/11/2012  . Knee pain, left 03/31/2012  . Depression 09/10/2011  . S/P Nissen fundoplication (without gastrostomy tube) procedure 07/26/2011  . Chronic cough 01/09/2011  . RESTLESS LEG SYNDROME 05/05/2008  . HYPERLIPIDEMIA 09/14/2007  . Osteopenia 09/14/2007    Sumner Boast., PT 02/16/2015, 2:30 PM  [place] [address] [place], [place], [address] Phone: [phone]   Fax:  [phone]

## 2015-02-20 ENCOUNTER — Telehealth: Payer: Self-pay | Admitting: Family Medicine

## 2015-02-20 MED ORDER — CITALOPRAM HYDROBROMIDE 20 MG PO TABS: 20.0000 mg | ORAL_TABLET | Freq: Every day | ORAL | Status: DC

## 2015-02-20 NOTE — Telephone Encounter (Signed)
Spoke with patient and she has been depressed since Thursday.  She is very teary.  She would like to know if she can have something called in or an office visit?  Please advise. Rx sent per Dr Sherren Mocha and Left message on machine for patient

## 2015-02-20 NOTE — Telephone Encounter (Signed)
Pt would like you to call her.  Pt refused to give me a reason, except it is a medical problem. Would like a cb. Pt wanted your vm.  Advised pt you did not have VM today.

## 2015-02-21 ENCOUNTER — Ambulatory Visit: Payer: Medicare Other | Admitting: Physical Therapy

## 2015-02-21 ENCOUNTER — Encounter: Payer: Self-pay | Admitting: Physical Therapy

## 2015-02-21 DIAGNOSIS — M545 Low back pain, unspecified: Secondary | ICD-10-CM

## 2015-02-21 DIAGNOSIS — R262 Difficulty in walking, not elsewhere classified: Secondary | ICD-10-CM | POA: Diagnosis not present

## 2015-02-21 DIAGNOSIS — M21372 Foot drop, left foot: Secondary | ICD-10-CM

## 2015-02-21 NOTE — Therapy (Signed)
[place], [place], [address] Phone: [phone]   Fax:  [phone]  Physical Therapy Treatment  Patient Details  Name: Felicia Cruz MRN: [id number] Date of Birth: Apr 04, 1941 Referring Provider:  Atilano Ina, MD  Encounter Date: 02/21/2015      PT End of Session - 02/21/15 1056    Visit Number 8   Date for PT Re-Evaluation 03/26/15   PT Start Time 1019   PT Stop Time 1120   PT Time Calculation (min) 61 min   Activity Tolerance Patient limited by fatigue   Behavior During Therapy [facility] for tasks assessed/performed      Past Medical History  Diagnosis Date  . RLS (restless legs syndrome)   . Esophageal stricture 2010  . Osteopenia   . Hyperlipemia   . Arthritis   . Kidney stones   . GERD (gastroesophageal reflux disease)   . Substance abuse     RECOVERING ALCOLHOLIC  . Alcoholism     HAS NOT HAD [place]  . Cataract   . 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 Hiatal hernia   . DDD (degenerative disc disease), lumbar   . Chronic back pain     Past Surgical History  Procedure Laterality Date  . Lumbar disc surgery  2008  . Facial cosmetic surgery      eyes, nose  . Abdominoplasty    . Liposuction  15 to 20 yrs ago  . Surgery for kidney stones    . Right index finger surgery  06/19/11    AT [facility]  . Laparoscopic nissen fundoplication  8/85/0277    Procedure: LAPAROSCOPIC NISSEN FUNDOPLICATION;  Surgeon: Pedro Earls, MD;  [facility];  Service: General;  Laterality: N/A;  Laparoscopic Nissen repair of hiatal hernia  with lighted bougie  . Esophageal manometry  02/17/2012    Procedure: ESOPHAGEAL MANOMETRY (EM);  Surgeon: Pedro Earls, MD;  [facility];  Service: Endoscopy;  Laterality: N/A;  . Dilation and curettage of uterus  many yrs ago    x 2  . Total knee arthroplasty Left 08/10/2012    Procedure: TOTAL  KNEE ARTHROPLASTY;  Surgeon: Mauri Pole, MD;  [facility];  Service: Orthopedics;  Laterality: Left;  . Joint replacement      left  . Open surgical repair of gluteal tendon Right 12/28/2012    Procedure: RIGHT GLUTEAL TENDON REPAIR;  Surgeon: Mauri Pole, MD;  [facility];  Service: Orthopedics;  Laterality: Right;    There were no vitals filed for this visit.  Visit Diagnosis:  Midline low back pain without sciatica  Difficulty walking  Foot drop, left      Subjective Assessment - 02/21/15 1021    Subjective I have been depressed all weekend.  I feel bad about my condition and then also trying to move out of my house.   Currently in Pain? Yes   Pain Score 3    Pain Location Back   Pain Orientation Lower   Pain Descriptors / Indicators Aching   Pain Type Chronic pain                         [facility] Adult PT Treatment/Exercise - 02/21/15 0001    Ambulation/Gait   Gait Comments gait with two canes and a SPC, a lot of cues to help out her sequencing  High Level Balance   High Level Balance Activities Side stepping;Backward walking;Marching forwards;Tandem walking   Lumbar Exercises: Aerobic   Elliptical NuStep Level 5 x 6 minutes   Tread Mill 1.2 mph x 3 minutes, close supervision and cues for step lengtha nd to decrease strunk lean   Knee/Hip Exercises: Machines for Strengthening   Cybex Knee Extension 5# 3x10   Cybex Knee Flexion 25# 2x10, 35# x 10   Cybex Leg Press 40# 2x10, left and right individual with 20# x 10 each, then no weight x 10 each leg   Other Machine seated rows 20#, lats 20#   Acupuncturist Location left shin   Printmaker Action Engineer, petroleum Parameters 5on 5 off   Electrical Stimulation Goals Neuromuscular facilitation                  PT Short Term Goals - 02/16/15 1429    PT SHORT TERM GOAL #1   Title independent with initial HEP   Status  Achieved           PT Long Term Goals - 02/21/15 1057    PT LONG TERM GOAL #3   Title walk with a SPC x 200 feet   Status On-going               Plan - 02/21/15 1056    Clinical Impression Statement Patient with significant right trendelenberg with gait, needs help with sequencing with cane.  Weak hips.   PT Next Visit Plan continue to work on strength and functional gait   Consulted and Agree with Plan of Care Patient        Problem List Patient Active Problem List   Diagnosis Date Noted  . Postoperative anemia due to acute blood loss 12/30/2014  . Essential hypertension, benign 07/04/2014  . Accelerated hypertension 05/05/2014  . Wheezing 03/30/2014  . Pulmonary embolism 10/14/2013  . Acute respiratory failure 10/14/2013  . Chest pain 10/14/2013  . Anemia 10/14/2013  . PE (pulmonary embolism) 10/14/2013  . Urinary frequency 04/07/2013  . Urinary incontinence 04/07/2013  . Right gluteus tear 12/28/2012  . Generalized anxiety disorder 08/19/2012  . Unspecified constipation 08/19/2012  . GERD (gastroesophageal reflux disease) 08/19/2012  . Overweight (BMI 25.0-29.9) 08/11/2012  . Expected blood loss anemia 08/11/2012  . Knee pain, left 03/31/2012  . Depression 09/10/2011  . S/P Nissen fundoplication (without gastrostomy tube) procedure 07/26/2011  . Chronic cough 01/09/2011  . RESTLESS LEG SYNDROME 05/05/2008  . HYPERLIPIDEMIA 09/14/2007  . Osteopenia 09/14/2007    Sumner Boast., PT 02/21/2015, 10:58 AM  [address] [place], [place], [address] Phone: [phone]   Fax:  [phone]

## 2015-02-23 ENCOUNTER — Encounter: Payer: Self-pay | Admitting: Physical Therapy

## 2015-02-23 ENCOUNTER — Ambulatory Visit: Payer: Medicare Other | Admitting: Physical Therapy

## 2015-02-23 DIAGNOSIS — M545 Low back pain, unspecified: Secondary | ICD-10-CM

## 2015-02-23 DIAGNOSIS — M21372 Foot drop, left foot: Secondary | ICD-10-CM

## 2015-02-23 DIAGNOSIS — R262 Difficulty in walking, not elsewhere classified: Secondary | ICD-10-CM | POA: Diagnosis not present

## 2015-02-23 NOTE — Therapy (Signed)
[place], [place], [address] Phone: [phone]   Fax:  [phone]  Physical Therapy Treatment  Patient Details  Name: Felicia Cruz MRN: [id number] Date of Birth: Sep 19, 1940 Referring Provider:  Atilano Ina, MD  Encounter Date: 02/23/2015      PT End of Session - 02/23/15 0933    Visit Number 9   Date for PT Re-Evaluation 03/26/15   PT Start Time 0843   PT Stop Time 0931   PT Time Calculation (min) 48 min   Activity Tolerance Patient limited by fatigue   Behavior During Therapy [facility] for tasks assessed/performed      Past Medical History  Diagnosis Date  . RLS (restless legs syndrome)   . Esophageal stricture 2010  . Osteopenia   . Hyperlipemia   . Arthritis   . Kidney stones   . GERD (gastroesophageal reflux disease)   . Substance abuse     RECOVERING ALCOLHOLIC  . Alcoholism     HAS NOT HAD [place]  . Cataract   . 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 Hiatal hernia   . DDD (degenerative disc disease), lumbar   . Chronic back pain     Past Surgical History  Procedure Laterality Date  . Lumbar disc surgery  2008  . Facial cosmetic surgery      eyes, nose  . Abdominoplasty    . Liposuction  15 to 20 yrs ago  . Surgery for kidney stones    . Right index finger surgery  06/19/11    AT [facility]  . Laparoscopic nissen fundoplication  6/96/2952    Procedure: LAPAROSCOPIC NISSEN FUNDOPLICATION;  Surgeon: Pedro Earls, MD;  [facility];  Service: General;  Laterality: N/A;  Laparoscopic Nissen repair of hiatal hernia  with lighted bougie  . Esophageal manometry  02/17/2012    Procedure: ESOPHAGEAL MANOMETRY (EM);  Surgeon: Pedro Earls, MD;  [facility];  Service: Endoscopy;  Laterality: N/A;  . Dilation and curettage of uterus  many yrs ago    x 2  . Total knee arthroplasty Left 08/10/2012    Procedure: TOTAL  KNEE ARTHROPLASTY;  Surgeon: Mauri Pole, MD;  [facility];  Service: Orthopedics;  Laterality: Left;  . Joint replacement      left  . Open surgical repair of gluteal tendon Right 12/28/2012    Procedure: RIGHT GLUTEAL TENDON REPAIR;  Surgeon: Mauri Pole, MD;  [facility];  Service: Orthopedics;  Laterality: Right;    There were no vitals filed for this visit.  Visit Diagnosis:  Midline low back pain without sciatica  Difficulty walking  Foot drop, left      Subjective Assessment - 02/23/15 0848    Subjective Reports that she is achey all over due to weather.   Currently in Pain? Yes   Pain Score 2    Pain Location Back  "all over"                         [facility] Adult PT Treatment/Exercise - 02/23/15 0001    Ambulation/Gait   Gait Comments with SPC x 150 feet wit some cues   High Level Balance   High Level Balance Activities Side stepping;Backward walking;Marching forwards;Tandem walking   High Level Balance Comments sit to stand activities, trying to not use hand   Lumbar Exercises: Aerobic  Elliptical NuStep Level 5 x 6 minutes   Tread Mill 1.2 mph x 3 minutes, close supervision and cues for step lengtha nd to decrease strunk lean   Knee/Hip Exercises: Machines for Strengthening   Cybex Knee Extension 5# 3x10   Cybex Knee Flexion 25# 2x10, 35# x 10   Cybex Leg Press 40# 2x10, left and right individual with 20# x 10 each, then no weight x 10 each leg   Knee/Hip Exercises: Sidelying   Hip ADduction Limitations with ball   Clams 2x15 each side   Other Sidelying Knee/Hip Exercises bridge with adduction and then with abduction                  PT Short Term Goals - 02/16/15 1429    PT SHORT TERM GOAL #1   Title independent with initial HEP   Status Achieved           PT Long Term Goals - 02/23/15 0934    PT LONG TERM GOAL #1   Title decrease Tug time to 15 seconds   Status On-going   PT LONG TERM GOAL #2   Title  decrease pain 50%   Status On-going               Plan - 02/23/15 0933    Clinical Impression Statement very weak hips, poor gait, overall improving, trying to move to a cane, but needs to be safe and a little better so we can not put undue stress on the back   PT Next Visit Plan continue to work on strength and functional gait   Consulted and Agree with Plan of Care Patient        Problem List Patient Active Problem List   Diagnosis Date Noted  . Postoperative anemia due to acute blood loss 12/30/2014  . Essential hypertension, benign 07/04/2014  . Accelerated hypertension 05/05/2014  . Wheezing 03/30/2014  . Pulmonary embolism 10/14/2013  . Acute respiratory failure 10/14/2013  . Chest pain 10/14/2013  . Anemia 10/14/2013  . PE (pulmonary embolism) 10/14/2013  . Urinary frequency 04/07/2013  . Urinary incontinence 04/07/2013  . Right gluteus tear 12/28/2012  . Generalized anxiety disorder 08/19/2012  . Unspecified constipation 08/19/2012  . GERD (gastroesophageal reflux disease) 08/19/2012  . Overweight (BMI 25.0-29.9) 08/11/2012  . Expected blood loss anemia 08/11/2012  . Knee pain, left 03/31/2012  . Depression 09/10/2011  . S/P Nissen fundoplication (without gastrostomy tube) procedure 07/26/2011  . Chronic cough 01/09/2011  . RESTLESS LEG SYNDROME 05/05/2008  . HYPERLIPIDEMIA 09/14/2007  . Osteopenia 09/14/2007    Sumner Boast., PT 02/23/2015, 10:09 AM  [address] [place], [place], [address] Phone: [phone]   Fax:  [phone]

## 2015-02-27 ENCOUNTER — Encounter: Payer: Self-pay | Admitting: Physical Therapy

## 2015-02-27 ENCOUNTER — Ambulatory Visit: Payer: Medicare Other | Admitting: Physical Therapy

## 2015-02-27 DIAGNOSIS — M545 Low back pain, unspecified: Secondary | ICD-10-CM

## 2015-02-27 DIAGNOSIS — R262 Difficulty in walking, not elsewhere classified: Secondary | ICD-10-CM | POA: Diagnosis not present

## 2015-02-27 DIAGNOSIS — M21372 Foot drop, left foot: Secondary | ICD-10-CM

## 2015-02-27 NOTE — Therapy (Signed)
[place], [place], [address] Phone: [phone]   Fax:  [phone]  Physical Therapy Treatment  Patient Details  Name: Felicia Cruz MRN: [id number] Date of Birth: 22-Jul-1940 Referring Provider:  Atilano Ina, MD  Encounter Date: 02/27/2015      PT End of Session - 02/27/15 1002    Visit Number 10   Date for PT Re-Evaluation 03/26/15   PT Start Time 0925   PT Stop Time 1015   PT Time Calculation (min) 50 min   Activity Tolerance Patient limited by fatigue   Behavior During Therapy [facility] for tasks assessed/performed      Past Medical History  Diagnosis Date  . RLS (restless legs syndrome)   . Esophageal stricture 2010  . Osteopenia   . Hyperlipemia   . Arthritis   . Kidney stones   . GERD (gastroesophageal reflux disease)   . Substance abuse     RECOVERING ALCOLHOLIC  . Alcoholism     HAS NOT HAD [place]  . Cataract   . 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 Hiatal hernia   . DDD (degenerative disc disease), lumbar   . Chronic back pain     Past Surgical History  Procedure Laterality Date  . Lumbar disc surgery  2008  . Facial cosmetic surgery      eyes, nose  . Abdominoplasty    . Liposuction  15 to 20 yrs ago  . Surgery for kidney stones    . Right index finger surgery  06/19/11    AT [facility]  . Laparoscopic nissen fundoplication  5/36/1443    Procedure: LAPAROSCOPIC NISSEN FUNDOPLICATION;  Surgeon: Pedro Earls, MD;  [facility];  Service: General;  Laterality: N/A;  Laparoscopic Nissen repair of hiatal hernia  with lighted bougie  . Esophageal manometry  02/17/2012    Procedure: ESOPHAGEAL MANOMETRY (EM);  Surgeon: Pedro Earls, MD;  [facility];  Service: Endoscopy;  Laterality: N/A;  . Dilation and curettage of uterus  many yrs ago    x 2  . Total knee arthroplasty Left 08/10/2012    Procedure:  TOTAL KNEE ARTHROPLASTY;  Surgeon: Mauri Pole, MD;  [facility];  Service: Orthopedics;  Laterality: Left;  . Joint replacement      left  . Open surgical repair of gluteal tendon Right 12/28/2012    Procedure: RIGHT GLUTEAL TENDON REPAIR;  Surgeon: Mauri Pole, MD;  [facility];  Service: Orthopedics;  Laterality: Right;    There were no vitals filed for this visit.  Visit Diagnosis:  Midline low back pain without sciatica  Difficulty walking  Foot drop, left      Subjective Assessment - 02/27/15 0943    Subjective Wore out, just tired.  No pain   Currently in Pain? No/denies                         [facility] Adult PT Treatment/Exercise - 02/27/15 0001    Ambulation/Gait   Gait Comments with SPC 2 x 150 feet wit some cues   High Level Balance   High Level Balance Activities Side stepping;Backward walking;Marching forwards;Tandem walking   High Level Balance Comments sit to stand activities, trying to not use hand   Lumbar Exercises: Aerobic   Tread Mill 1.3 mph x 3 minutes, close supervision and cues for step lengtha  nd to decrease strunk lean   Knee/Hip Exercises: Machines for Strengthening   Cybex Knee Extension 5# 3x10   Cybex Knee Flexion 25# 2x10, 35# x 10   Cybex Leg Press 40# 2x10, left and right individual with 20# x 10 each, then no weight x 10 each leg   Other Machine seated rows 20#, lats 20#   Knee/Hip Exercises: Sidelying   Hip ABduction 2 sets;10 reps   Hip ABduction Limitations some assist needed for the right hip used tband to assist   Clams 2x15 each side   Other Sidelying Knee/Hip Exercises bridge with adduction and then with abduction   Other Sidelying Knee/Hip Exercises feet on ball K2C, rotation and bridges, isometric abdominal ball crunch, 15# seated rows and lats                  PT Short Term Goals - 02/16/15 1429    PT SHORT TERM GOAL #1   Title independent with initial HEP   Status Achieved            PT Long Term Goals - 02/23/15 0934    PT LONG TERM GOAL #1   Title decrease Tug time to 15 seconds   Status On-going   PT LONG TERM GOAL #2   Title decrease pain 50%   Status On-going               Plan - March 23, 2015 1039    Clinical Impression Statement Hips continue to be weak, she is able to walk with the [facility] with SBA, the first few steps are very poor due to the weakness of the hips   PT Next Visit Plan continue to work on strength and functional gait   Consulted and Agree with Plan of Care Patient          G-Codes - 2015/03/23 1040    Functional Assessment Tool Used foto   Functional Limitation Mobility: Walking and moving around   Mobility: Walking and Moving Around Current Status [phone]) At least 60 percent but less than 80 percent impaired, limited or restricted   Mobility: Walking and Moving Around Goal Status [phone]) At least 40 percent but less than 60 percent impaired, limited or restricted      Problem List Patient Active Problem List   Diagnosis Date Noted  . Postoperative anemia due to acute blood loss 12/30/2014  . Essential hypertension, benign 07/04/2014  . Accelerated hypertension 05/05/2014  . Wheezing 03/30/2014  . Pulmonary embolism 10/14/2013  . Acute respiratory failure 10/14/2013  . Chest pain 10/14/2013  . Anemia 10/14/2013  . PE (pulmonary embolism) 10/14/2013  . Urinary frequency 04/07/2013  . Urinary incontinence 04/07/2013  . Right gluteus tear 12/28/2012  . Generalized anxiety disorder 08/19/2012  . Unspecified constipation 08/19/2012  . GERD (gastroesophageal reflux disease) 08/19/2012  . Overweight (BMI 25.0-29.9) 08/11/2012  . Expected blood loss anemia 08/11/2012  . Knee pain, left 03/31/2012  . Depression 09/10/2011  . S/P Nissen fundoplication (without gastrostomy tube) procedure 07/26/2011  . Chronic cough 01/09/2011  . RESTLESS LEG SYNDROME 05/05/2008  . HYPERLIPIDEMIA 09/14/2007  . Osteopenia 09/14/2007     Sumner Boast., PT 03/23/2015, 10:41 AM  [address] [place], [place], [address] Phone: [phone]   Fax:  [phone]

## 2015-03-01 ENCOUNTER — Ambulatory Visit: Payer: Medicare Other | Admitting: Physical Therapy

## 2015-03-01 ENCOUNTER — Encounter: Payer: Self-pay | Admitting: Physical Therapy

## 2015-03-01 DIAGNOSIS — M545 Low back pain, unspecified: Secondary | ICD-10-CM

## 2015-03-01 DIAGNOSIS — M21372 Foot drop, left foot: Secondary | ICD-10-CM | POA: Diagnosis not present

## 2015-03-01 DIAGNOSIS — R262 Difficulty in walking, not elsewhere classified: Secondary | ICD-10-CM

## 2015-03-01 NOTE — Therapy (Signed)
[place], [place], [address] Phone: [phone]   Fax:  [phone]  Physical Therapy Treatment  Patient Details  Name: Felicia Cruz MRN: [id number] Date of Birth: December 01, 1940 Referring Provider:  Atilano Ina, MD  Encounter Date: 03/01/2015      PT End of Session - 03/01/15 1351    Visit Number 11   Date for PT Re-Evaluation 03/26/15   PT Start Time 1309   PT Stop Time 1408   PT Time Calculation (min) 59 min   Activity Tolerance Patient limited by fatigue   Behavior During Therapy [facility] for tasks assessed/performed      Past Medical History  Diagnosis Date  . RLS (restless legs syndrome)   . Esophageal stricture 2010  . Osteopenia   . Hyperlipemia   . Arthritis   . Kidney stones   . GERD (gastroesophageal reflux disease)   . Substance abuse     RECOVERING ALCOLHOLIC  . Alcoholism     HAS NOT HAD [place]  . Cataract   . 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 Hiatal hernia   . DDD (degenerative disc disease), lumbar   . Chronic back pain     Past Surgical History  Procedure Laterality Date  . Lumbar disc surgery  2008  . Facial cosmetic surgery      eyes, nose  . Abdominoplasty    . Liposuction  15 to 20 yrs ago  . Surgery for kidney stones    . Right index finger surgery  06/19/11    AT [facility]  . Laparoscopic nissen fundoplication  4/70/9628    Procedure: LAPAROSCOPIC NISSEN FUNDOPLICATION;  Surgeon: Pedro Earls, MD;  [facility];  Service: General;  Laterality: N/A;  Laparoscopic Nissen repair of hiatal hernia  with lighted bougie  . Esophageal manometry  02/17/2012    Procedure: ESOPHAGEAL MANOMETRY (EM);  Surgeon: Pedro Earls, MD;  [facility];  Service: Endoscopy;  Laterality: N/A;  . Dilation and curettage of uterus  many yrs ago    x 2  . Total knee arthroplasty Left 08/10/2012    Procedure:  TOTAL KNEE ARTHROPLASTY;  Surgeon: Mauri Pole, MD;  [facility];  Service: Orthopedics;  Laterality: Left;  . Joint replacement      left  . Open surgical repair of gluteal tendon Right 12/28/2012    Procedure: RIGHT GLUTEAL TENDON REPAIR;  Surgeon: Mauri Pole, MD;  [facility];  Service: Orthopedics;  Laterality: Right;    There were no vitals filed for this visit.  Visit Diagnosis:  Midline low back pain without sciatica  Difficulty walking  Foot drop, left      Subjective Assessment - 03/01/15 1313    Subjective Still tired and sore.  I have been trying to clean the house.  I have to take multiple breaks   Currently in Pain? Yes   Pain Score 9    Pain Location Back   Pain Orientation Lower   Pain Descriptors / Indicators Aching;Sore;Spasm   Pain Type Chronic pain   Pain Onset More than a month ago   Pain Frequency Constant   Aggravating Factors  walking   Pain Relieving Factors rest                         [facility] Adult PT Treatment/Exercise - 03/01/15 0001  Ambulation/Gait   Gait Comments tried with SPC and with one hand hold 180 feet each way, needs some cues to pick up feet, significant trendelenberg type gait   High Level Balance   High Level Balance Activities Side stepping;Backward walking;Marching forwards;Tandem walking   High Level Balance Comments sit to stand activities, trying to not use hand   Lumbar Exercises: Aerobic   Elliptical NuStep Level 5 x 6 minutes   Knee/Hip Exercises: Machines for Strengthening   Cybex Knee Extension 5# 3x10   Cybex Knee Flexion 25# 2x10, 35# x 10   Cybex Leg Press 40# 2x10, left and right individual with 20# x 10 each, then no weight x 10 each leg   Other Machine seated rows 20#, lats 20#   Electrical Stimulation   Electrical Stimulation Location low back   Electrical Stimulation Action IFC   Electrical Stimulation Parameters tolerance   Electrical Stimulation Goals Pain                   PT Short Term Goals - 02/16/15 1429    PT SHORT TERM GOAL #1   Title independent with initial HEP   Status Achieved           PT Long Term Goals - 03/01/15 1355    PT LONG TERM GOAL #1   Title decrease Tug time to 15 seconds   Status On-going   PT LONG TERM GOAL #2   Title decrease pain 50%   Status On-going   PT LONG TERM GOAL #3   Title walk with a SPC x 200 feet   Status Partially Met               Plan - 03/01/15 1352    Clinical Impression Statement Tried an AFO today, I did not think that this helped.  Used a SPC in the left hand, I do feel that she does well with this with less trendelenberg and trunk lean, she fatigues with HHA gait and has very significant trunk lean.   PT Next Visit Plan continue to work on strength and functional gait   Consulted and Agree with Plan of Care Patient        Problem List Patient Active Problem List   Diagnosis Date Noted  . Postoperative anemia due to acute blood loss 12/30/2014  . Essential hypertension, benign 07/04/2014  . Accelerated hypertension 05/05/2014  . Wheezing 03/30/2014  . Pulmonary embolism 10/14/2013  . Acute respiratory failure 10/14/2013  . Chest pain 10/14/2013  . Anemia 10/14/2013  . PE (pulmonary embolism) 10/14/2013  . Urinary frequency 04/07/2013  . Urinary incontinence 04/07/2013  . Right gluteus tear 12/28/2012  . Generalized anxiety disorder 08/19/2012  . Unspecified constipation 08/19/2012  . GERD (gastroesophageal reflux disease) 08/19/2012  . Overweight (BMI 25.0-29.9) 08/11/2012  . Expected blood loss anemia 08/11/2012  . Knee pain, left 03/31/2012  . Depression 09/10/2011  . S/P Nissen fundoplication (without gastrostomy tube) procedure 07/26/2011  . Chronic cough 01/09/2011  . RESTLESS LEG SYNDROME 05/05/2008  . HYPERLIPIDEMIA 09/14/2007  . Osteopenia 09/14/2007    Sumner Boast., PT 03/01/2015, 1:56 PM  [address] [place], [place], [address] Phone: [phone]   Fax:  [phone]

## 2015-03-06 ENCOUNTER — Encounter: Payer: Self-pay | Admitting: Family Medicine

## 2015-03-06 ENCOUNTER — Ambulatory Visit (INDEPENDENT_AMBULATORY_CARE_PROVIDER_SITE_OTHER): Payer: Medicare Other | Admitting: Family Medicine

## 2015-03-06 ENCOUNTER — Ambulatory Visit: Payer: Medicare Other | Attending: Neurological Surgery | Admitting: Physical Therapy

## 2015-03-06 ENCOUNTER — Encounter: Payer: Self-pay | Admitting: Physical Therapy

## 2015-03-06 VITALS — BP 120/70

## 2015-03-06 DIAGNOSIS — F329 Major depressive disorder, single episode, unspecified: Secondary | ICD-10-CM

## 2015-03-06 DIAGNOSIS — R262 Difficulty in walking, not elsewhere classified: Secondary | ICD-10-CM | POA: Insufficient documentation

## 2015-03-06 DIAGNOSIS — M545 Low back pain, unspecified: Secondary | ICD-10-CM

## 2015-03-06 DIAGNOSIS — M6289 Other specified disorders of muscle: Secondary | ICD-10-CM | POA: Insufficient documentation

## 2015-03-06 DIAGNOSIS — M21372 Foot drop, left foot: Secondary | ICD-10-CM | POA: Diagnosis not present

## 2015-03-06 DIAGNOSIS — F32A Depression, unspecified: Secondary | ICD-10-CM

## 2015-03-06 MED ORDER — LORAZEPAM 1 MG PO TABS: 1.0000 mg | ORAL_TABLET | Freq: Every day | ORAL | Status: DC

## 2015-03-06 MED ORDER — CITALOPRAM HYDROBROMIDE 20 MG PO TABS: ORAL_TABLET | ORAL | Status: DC

## 2015-03-06 NOTE — Patient Instructions (Signed)
Celexa 20 mg........ increase the dose to a tablet a half at bedtime  Ativan 1 mg.......Marland Kitchen 1 tablet at bedtime  Return in 6 weeks for follow-up

## 2015-03-06 NOTE — Progress Notes (Signed)
Pre visit review using our clinic review tool, if applicable. No additional management support is needed unless otherwise documented below in the visit note. 

## 2015-03-06 NOTE — Progress Notes (Signed)
   Subjective:    Patient ID: Felicia Cruz, female    DOB: 04-23-1941, 74 y.o.   MRN: [id number]  HPI Felicia Cruz is a 73 year old widowed female nonsmoker who comes in today for follow-up of depression  She does Celexa 20 mg daily for many years. She recently moved from a house into a smaller facility at [facility] burn. She's also recovering from spinal surgery. All the stressors have added to her depression and about a week ago she became very depressed and crying a lot sleeping a lot then not sleeping. We added Ativan 1 mg at bedtime temporarily. She's here today for evaluation.  She says the edition the Ativan is helped she is left anxious and feels better still not sleeping great   Review of Systems Review of systems negative    Objective:   Physical Exam  Well-developed well-nourished female no acute distress vital signs stable she's afebrile she's oriented 3 she is awake alert appropriate      Assessment & Plan:  Depression.......... increase Celexa to 30 mg daily..... Continue Ativan 1 mg daily follow-up in 6 weeks

## 2015-03-06 NOTE — Therapy (Signed)
[place] [address] [place], [place], [address] Phone: [phone]   Fax:  [phone]  Physical Therapy Treatment  Patient Details  Name: Felicia Cruz MRN: [id number] Date of Birth: [birth year] Referring Provider:  Atilano Ina, MD  Encounter Date: 03/06/2015      PT End of Session - 03/06/15 1016    Visit Number 12   Date for PT Re-Evaluation 03/26/15   PT Start Time 0928   PT Stop Time 1030   PT Time Calculation (min) 62 min      Past Medical History  Diagnosis Date  . RLS (restless legs syndrome)   . Esophageal stricture 2010  . Osteopenia   . Hyperlipemia   . Arthritis   . Kidney stones   . GERD (gastroesophageal reflux disease)   . Substance abuse     RECOVERING ALCOLHOLIC  . Alcoholism ([place])     HAS NOT HAD [place]  . Cataract   . 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 Hiatal hernia   . DDD (degenerative disc disease), lumbar   . Chronic back pain     Past Surgical History  Procedure Laterality Date  . Lumbar disc surgery  2008  . Facial cosmetic surgery      eyes, nose  . Abdominoplasty    . Liposuction  15 to 20 yrs ago  . Surgery for kidney stones    . Right index finger surgery  06/19/11    AT [facility]  . Laparoscopic nissen fundoplication  7/85/8850    Procedure: LAPAROSCOPIC NISSEN FUNDOPLICATION;  Surgeon: Pedro Earls, MD;  [facility];  Service: General;  Laterality: N/A;  Laparoscopic Nissen repair of hiatal hernia  with lighted bougie  . Esophageal manometry  02/17/2012    Procedure: ESOPHAGEAL MANOMETRY (EM);  Surgeon: Pedro Earls, MD;  [facility];  Service: Endoscopy;  Laterality: N/A;  . Dilation and curettage of uterus  many yrs ago    x 2  . Total knee arthroplasty Left 08/10/2012    Procedure: TOTAL KNEE ARTHROPLASTY;  Surgeon: Mauri Pole, MD;  [facility];  Service: Orthopedics;   Laterality: Left;  . Joint replacement      left  . Open surgical repair of gluteal tendon Right 12/28/2012    Procedure: RIGHT GLUTEAL TENDON REPAIR;  Surgeon: Mauri Pole, MD;  [facility];  Service: Orthopedics;  Laterality: Right;    There were no vitals filed for this visit.  Visit Diagnosis:  Midline low back pain without sciatica  Difficulty walking  Foot drop, left      Subjective Assessment - 03/06/15 0931    Subjective I tried to walk with the cane most of the weekend.  My back is pretty sore.   Currently in Pain? Yes   Pain Score 7    Pain Location Back   Pain Orientation Lower   Pain Descriptors / Indicators Aching;Sore;Spasm   Pain Type Chronic pain   Pain Onset More than a month ago   Pain Frequency Constant   Aggravating Factors  walking with cane   Pain Relieving Factors rest                         [facility] Adult PT Treatment/Exercise - 03/06/15 0001    Ambulation/Gait   Gait Comments tried with SPC and with one hand hold 180 feet  each way, needs some cues to pick up feet, significant trendelenberg type gait   High Level Balance   High Level Balance Activities Side stepping;Backward walking;Marching forwards;Tandem walking   High Level Balance Comments sit to stand activities, trying to not use hand   Lumbar Exercises: Aerobic   Elliptical NuStep Level 5 x 6 minutes   Tread Mill 1.4 mph x 3 minutes, close supervision and cues for step lengtha nd to decrease strunk lean   Knee/Hip Exercises: Machines for Strengthening   Cybex Knee Extension 5# 3x10   Cybex Knee Flexion 25# 2x10, 35# x 10   Cybex Leg Press 40# 2x10, left and right individual with 20# x 10 each, then no weight x 10 each leg   Other Machine seated rows 25#, lats 20#   Knee/Hip Exercises: Sidelying   Other Sidelying Knee/Hip Exercises Blue tband hip abduction in supine   Other Sidelying Knee/Hip Exercises feet on ball K2C, rotation and bridges, isometric abdominal ball  crunch, 15# seated rows and lats   Electrical Stimulation   Electrical Stimulation Location low back   Electrical Stimulation Action IFC   Electrical Stimulation Parameters tolerance   Electrical Stimulation Goals Pain                  PT Short Term Goals - 02/16/15 1429    PT SHORT TERM GOAL #1   Title independent with initial HEP   Status Achieved           PT Long Term Goals - 03/06/15 1017    PT LONG TERM GOAL #1   Title decrease Tug time to 15 seconds   Status Partially Met   PT LONG TERM GOAL #2   Title decrease pain 50%   Status Partially Met   PT LONG TERM GOAL #3   Title walk with a SPC x 200 feet   Status Partially Met               Plan - 03/06/15 1016    Clinical Impression Statement Walking more with SPC.  Strength is improving, gait still with abnormality due to weakness in the hips and this is causing some LBP   PT Next Visit Plan continue to work on strength and functional gait   Consulted and Agree with Plan of Care Patient        Problem List Patient Active Problem List   Diagnosis Date Noted  . Postoperative anemia due to acute blood loss 12/30/2014  . Essential hypertension, benign 07/04/2014  . Accelerated hypertension 05/05/2014  . Wheezing 03/30/2014  . Pulmonary embolism ([place]) 10/14/2013  . Acute respiratory failure ([place]) 10/14/2013  . Chest pain 10/14/2013  . Anemia 10/14/2013  . PE (pulmonary embolism) 10/14/2013  . Urinary frequency 04/07/2013  . Urinary incontinence 04/07/2013  . Right gluteus tear 12/28/2012  . Generalized anxiety disorder 08/19/2012  . Unspecified constipation 08/19/2012  . GERD (gastroesophageal reflux disease) 08/19/2012  . Overweight (BMI 25.0-29.9) 08/11/2012  . Expected blood loss anemia 08/11/2012  . Knee pain, left 03/31/2012  . Depression 09/10/2011  . S/P Nissen fundoplication (without gastrostomy tube) procedure 07/26/2011  . Chronic cough 01/09/2011  . RESTLESS LEG SYNDROME  05/05/2008  . HYPERLIPIDEMIA 09/14/2007  . Osteopenia 09/14/2007    Sumner Boast., PT 03/06/2015, 10:18 AM  [address] [place], [place], [address] Phone: [phone]   Fax:  [phone]

## 2015-03-08 ENCOUNTER — Ambulatory Visit: Payer: Medicare Other | Admitting: Physical Therapy

## 2015-03-08 ENCOUNTER — Encounter: Payer: Self-pay | Admitting: Internal Medicine

## 2015-03-08 ENCOUNTER — Encounter: Payer: Self-pay | Admitting: Physical Therapy

## 2015-03-08 DIAGNOSIS — M21372 Foot drop, left foot: Secondary | ICD-10-CM

## 2015-03-08 DIAGNOSIS — M545 Low back pain, unspecified: Secondary | ICD-10-CM

## 2015-03-08 DIAGNOSIS — R262 Difficulty in walking, not elsewhere classified: Secondary | ICD-10-CM | POA: Diagnosis not present

## 2015-03-08 DIAGNOSIS — M6289 Other specified disorders of muscle: Secondary | ICD-10-CM | POA: Diagnosis not present

## 2015-03-08 NOTE — Therapy (Signed)
[place], [place], [address] Phone: [phone]   Fax:  [phone]  Physical Therapy Treatment  Patient Details  Name: Felicia Cruz MRN: [id number] Date of Birth: 16-Mar-1941 Referring Provider:  Atilano Ina, MD  Encounter Date: 03/08/2015      PT End of Session - 03/08/15 1129    Visit Number 13   Date for PT Re-Evaluation 03/26/15   PT Start Time 1012   PT Stop Time 1106   PT Time Calculation (min) 54 min   Activity Tolerance Patient limited by fatigue   Behavior During Therapy [facility] for tasks assessed/performed      Past Medical History  Diagnosis Date  . RLS (restless legs syndrome)   . Esophageal stricture 2010  . Osteopenia   . Hyperlipemia   . Arthritis   . Kidney stones   . GERD (gastroesophageal reflux disease)   . Substance abuse     RECOVERING ALCOLHOLIC  . Alcoholism ([place])     HAS NOT HAD [place]  . Cataract   . 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 Hiatal hernia   . DDD (degenerative disc disease), lumbar   . Chronic back pain     Past Surgical History  Procedure Laterality Date  . Lumbar disc surgery  2008  . Facial cosmetic surgery      eyes, nose  . Abdominoplasty    . Liposuction  15 to 20 yrs ago  . Surgery for kidney stones    . Right index finger surgery  06/19/11    AT [facility]  . Laparoscopic nissen fundoplication  7/00/1749    Procedure: LAPAROSCOPIC NISSEN FUNDOPLICATION;  Surgeon: Pedro Earls, MD;  [facility];  Service: General;  Laterality: N/A;  Laparoscopic Nissen repair of hiatal hernia  with lighted bougie  . Esophageal manometry  02/17/2012    Procedure: ESOPHAGEAL MANOMETRY (EM);  Surgeon: Pedro Earls, MD;  [facility];  Service: Endoscopy;  Laterality: N/A;  . Dilation and curettage of uterus  many yrs ago    x 2  . 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 Joint replacement      left  . Open surgical repair of gluteal tendon Right 12/28/2012    Procedure: RIGHT GLUTEAL TENDON REPAIR;  Surgeon: Mauri Pole, MD;  [facility];  Service: Orthopedics;  Laterality: Right;    There were no vitals filed for this visit.  Visit Diagnosis:  Midline low back pain without sciatica  Difficulty walking  Foot drop, left      Subjective Assessment - 03/08/15 1019    Subjective Reports that the last treatment helped the back, still sore with walking with the cane   Currently in Pain? Yes   Pain Score 4    Pain Location Back   Pain Orientation Lower                         [facility] Adult PT Treatment/Exercise - 03/08/15 0001    Ambulation/Gait   Gait Comments SPC with supervision only 180 feet x 2   High Level Balance   High Level Balance Activities Side stepping;Backward walking;Marching forwards;Tandem walking   High Level Balance Comments sit to stand activities, trying to not use  hand   Lumbar Exercises: Aerobic   Elliptical NuStep Level 6 x 6 minutes   Knee/Hip Exercises: Machines for Strengthening   Cybex Knee Extension 5# 3x10   Cybex Knee Flexion 25# 2x10, 35# x 10   Cybex Leg Press 40# 2x10, left and right individual with 20# x 10 each, then no weight x 10 each leg   Hip Cybex standing pulleys 50# push down bilateral, the right hip was very weak and had difficulty, the leg would go into ER and she could not control it   Other Machine seated rows 25#, lats 20#   Knee/Hip Exercises: Sidelying   Other Sidelying Knee/Hip Exercises feet on ball K2C, rotation and bridges, isometric abdominal ball crunch, 15# seated rows and lats   Electrical Stimulation   Electrical Stimulation Location low back   Electrical Stimulation Action IFC   Electrical Stimulation Parameters tolerance   Electrical  Stimulation Goals Pain                  PT Short Term Goals - 02/16/15 1429    PT SHORT TERM GOAL #1   Title independent with initial HEP   Status Achieved           PT Long Term Goals - 03/06/15 1017    PT LONG TERM GOAL #1   Title decrease Tug time to 15 seconds   Status Partially Met   PT LONG TERM GOAL #2   Title decrease pain 50%   Status Partially Met   PT LONG TERM GOAL #3   Title walk with a SPC x 200 feet   Status Partially Met               Plan - 03/08/15 1130    Clinical Impression Statement right hip very weak with abduction and ER.  Gait with SPC is with significant right trendelenberg.  She is getting stronger and doing more   PT Next Visit Plan continue to work on strength and functional gait   Consulted and Agree with Plan of Care Patient        Problem List Patient Active Problem List   Diagnosis Date Noted  . Postoperative anemia due to acute blood loss 12/30/2014  . Essential hypertension, benign 07/04/2014  . Accelerated hypertension 05/05/2014  . Wheezing 03/30/2014  . Pulmonary embolism ([place]) 10/14/2013  . Acute respiratory failure ([place]) 10/14/2013  . Chest pain 10/14/2013  . Anemia 10/14/2013  . PE (pulmonary embolism) 10/14/2013  . Urinary frequency 04/07/2013  . Urinary incontinence 04/07/2013  . Right gluteus tear 12/28/2012  . Generalized anxiety disorder 08/19/2012  . Unspecified constipation 08/19/2012  . GERD (gastroesophageal reflux disease) 08/19/2012  . Overweight (BMI 25.0-29.9) 08/11/2012  . Expected blood loss anemia 08/11/2012  . Knee pain, left 03/31/2012  . Depression 09/10/2011  . S/P Nissen fundoplication (without gastrostomy tube) procedure 07/26/2011  . Chronic cough 01/09/2011  . RESTLESS LEG SYNDROME 05/05/2008  . HYPERLIPIDEMIA 09/14/2007  . Osteopenia 09/14/2007    Sumner Boast., PT 03/08/2015, 11:32 AM  [address] [place], [place], [address] Phone: [phone]   Fax:  [phone]

## 2015-03-13 ENCOUNTER — Encounter: Payer: Self-pay | Admitting: Physical Therapy

## 2015-03-13 ENCOUNTER — Ambulatory Visit: Payer: Medicare Other | Admitting: Physical Therapy

## 2015-03-13 DIAGNOSIS — M21372 Foot drop, left foot: Secondary | ICD-10-CM

## 2015-03-13 DIAGNOSIS — R262 Difficulty in walking, not elsewhere classified: Secondary | ICD-10-CM | POA: Diagnosis not present

## 2015-03-13 DIAGNOSIS — M545 Low back pain, unspecified: Secondary | ICD-10-CM

## 2015-03-13 DIAGNOSIS — M6289 Other specified disorders of muscle: Secondary | ICD-10-CM | POA: Diagnosis not present

## 2015-03-13 NOTE — Therapy (Signed)
[place], [place], [address] Phone: [phone]   Fax:  [phone]  Physical Therapy Treatment  Patient Details  Name: Felicia Cruz MRN: [id number] Date of Birth: 03/31/1941 Referring Provider:  Atilano Ina, MD  Encounter Date: 03/13/2015      PT End of Session - 03/13/15 0845    Visit Number 14   Date for PT Re-Evaluation 03/26/15   PT Start Time 0756   PT Stop Time 0900   PT Time Calculation (min) 64 min   Activity Tolerance Patient tolerated treatment well   Behavior During Therapy [facility] for tasks assessed/performed      Past Medical History  Diagnosis Date  . RLS (restless legs syndrome)   . Esophageal stricture 2010  . Osteopenia   . Hyperlipemia   . Arthritis   . Kidney stones   . GERD (gastroesophageal reflux disease)   . Substance abuse     RECOVERING ALCOLHOLIC  . Alcoholism ([place])     HAS NOT HAD [place]  . Cataract   . 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 Hiatal hernia   . DDD (degenerative disc disease), lumbar   . Chronic back pain     Past Surgical History  Procedure Laterality Date  . Lumbar disc surgery  2008  . Facial cosmetic surgery      eyes, nose  . Abdominoplasty    . Liposuction  15 to 20 yrs ago  . Surgery for kidney stones    . Right index finger surgery  06/19/11    AT [facility]  . Laparoscopic nissen fundoplication  11/14/4313    Procedure: LAPAROSCOPIC NISSEN FUNDOPLICATION;  Surgeon: Pedro Earls, MD;  [facility];  Service: General;  Laterality: N/A;  Laparoscopic Nissen repair of hiatal hernia  with lighted bougie  . Esophageal manometry  02/17/2012    Procedure: ESOPHAGEAL MANOMETRY (EM);  Surgeon: Pedro Earls, MD;  [facility];  Service: Endoscopy;  Laterality: N/A;  . Dilation and curettage of uterus  many yrs ago    x 2  . 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 Joint replacement      left  . Open surgical repair of gluteal tendon Right 12/28/2012    Procedure: RIGHT GLUTEAL TENDON REPAIR;  Surgeon: Mauri Pole, MD;  [facility];  Service: Orthopedics;  Laterality: Right;    There were no vitals filed for this visit.  Visit Diagnosis:  Midline low back pain without sciatica  Difficulty walking  Foot drop, left      Subjective Assessment - 03/13/15 0755    Subjective All that rain has my back really hurting.  I did not do anything over the weekend.   Currently in Pain? Yes   Pain Score 6    Pain Location Back   Pain Orientation Lower   Pain Descriptors / Indicators Aching   Pain Type Chronic pain   Pain Onset More than a month ago   Pain Frequency Constant   Aggravating Factors  walking and standing   Pain Relieving Factors rest and heat                         [facility] Adult PT Treatment/Exercise - 03/13/15 0001  Ambulation/Gait   Gait Comments SPC with supervision only 180 feet x 2, worked on stairs step over step with hand rail and assist.   High Level Balance   High Level Balance Activities Side stepping;Backward walking;Marching forwards;Tandem walking   High Level Balance Comments sit to stand activities, trying to not use hand   Lumbar Exercises: Aerobic   Elliptical NuStep Level 6 x 6 minutes   Tread Mill 1.4 mph x 4 minutes, close supervision and cues for step lengtha nd to decrease strunk lean   Knee/Hip Exercises: Machines for Strengthening   Cybex Knee Extension 5# 3x10   Cybex Knee Flexion 35# 2x10, 35# x 10   Cybex Leg Press 40# 2x10, left and right individual with 20# x 10 each, then no weight x 10 each leg   Other Machine seated rows 25#, lats 20#, seated yelloow tband ER of the hips   Knee/Hip Exercises: Sidelying   Other Sidelying Knee/Hip Exercises feet on ball  K2C, rotation and bridges, isometric abdominal ball crunch, 15# seated rows and lats   Electrical Stimulation   Electrical Stimulation Location low back   Electrical Stimulation Action IFC   Electrical Stimulation Parameters tolerance   Electrical Stimulation Goals Pain   Ankle Exercises: Seated   Other Seated Ankle Exercises sit fit ankle motions with assist, yellow tband ankle exercises with a lot of cues                  PT Short Term Goals - 02/16/15 1429    PT SHORT TERM GOAL #1   Title independent with initial HEP   Status Achieved           PT Long Term Goals - 03/13/15 0847    PT LONG TERM GOAL #3   Title walk with a SPC x 200 feet   Status Achieved               Plan - 03/13/15 0846    Clinical Impression Statement Stairs were difficult due to the weakness of the right hip to get up and then the drop foot on the left to clear toe.  Needed a lot of assist to start but then was able to do with less assist   PT Next Visit Plan continue to work on strength and functional gait   Consulted and Agree with Plan of Care Patient        Problem List Patient Active Problem List   Diagnosis Date Noted  . Postoperative anemia due to acute blood loss 12/30/2014  . Essential hypertension, benign 07/04/2014  . Accelerated hypertension 05/05/2014  . Wheezing 03/30/2014  . Pulmonary embolism ([place]) 10/14/2013  . Acute respiratory failure ([place]) 10/14/2013  . Chest pain 10/14/2013  . Anemia 10/14/2013  . PE (pulmonary embolism) 10/14/2013  . Urinary frequency 04/07/2013  . Urinary incontinence 04/07/2013  . Right gluteus tear 12/28/2012  . Generalized anxiety disorder 08/19/2012  . Unspecified constipation 08/19/2012  . GERD (gastroesophageal reflux disease) 08/19/2012  . Overweight (BMI 25.0-29.9) 08/11/2012  . Expected blood loss anemia 08/11/2012  . Knee pain, left 03/31/2012  . Depression 09/10/2011  . S/P Nissen fundoplication (without gastrostomy  tube) procedure 07/26/2011  . Chronic cough 01/09/2011  . RESTLESS LEG SYNDROME 05/05/2008  . HYPERLIPIDEMIA 09/14/2007  . Osteopenia 09/14/2007    Sumner Boast., PT 03/13/2015, 8:49 AM  [address] [place], [place], [address] Phone: [phone]   Fax:  [phone]

## 2015-03-15 ENCOUNTER — Encounter: Payer: Self-pay | Admitting: Physical Therapy

## 2015-03-15 ENCOUNTER — Ambulatory Visit: Payer: Medicare Other | Admitting: Physical Therapy

## 2015-03-15 DIAGNOSIS — R262 Difficulty in walking, not elsewhere classified: Secondary | ICD-10-CM

## 2015-03-15 DIAGNOSIS — M21372 Foot drop, left foot: Secondary | ICD-10-CM

## 2015-03-15 DIAGNOSIS — M6289 Other specified disorders of muscle: Secondary | ICD-10-CM | POA: Diagnosis not present

## 2015-03-15 DIAGNOSIS — M545 Low back pain, unspecified: Secondary | ICD-10-CM

## 2015-03-15 NOTE — Therapy (Signed)
[place], [place], [address] Phone: [phone]   Fax:  [phone]  Physical Therapy Treatment  Patient Details  Name: Felicia Cruz MRN: [id number] Date of Birth: [birth year] Referring Provider:  Atilano Ina, MD  Encounter Date: 03/15/2015      PT End of Session - 03/15/15 1158    Visit Number 15   Date for PT Re-Evaluation 03/26/15   PT Start Time 9371   PT Stop Time 1153   PT Time Calculation (min) 61 min   Activity Tolerance Patient tolerated treatment well   Behavior During Therapy [facility] for tasks assessed/performed      Past Medical History  Diagnosis Date  . RLS (restless legs syndrome)   . Esophageal stricture 2010  . Osteopenia   . Hyperlipemia   . Arthritis   . Kidney stones   . GERD (gastroesophageal reflux disease)   . Substance abuse     RECOVERING ALCOLHOLIC  . Alcoholism ([place])     HAS NOT HAD [place]  . Cataract   . 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 Hiatal hernia   . DDD (degenerative disc disease), lumbar   . Chronic back pain     Past Surgical History  Procedure Laterality Date  . Lumbar disc surgery  2008  . Facial cosmetic surgery      eyes, nose  . Abdominoplasty    . Liposuction  15 to 20 yrs ago  . Surgery for kidney stones    . Right index finger surgery  06/19/11    AT [facility]  . Laparoscopic nissen fundoplication  6/96/7893    Procedure: LAPAROSCOPIC NISSEN FUNDOPLICATION;  Surgeon: Pedro Earls, MD;  [facility];  Service: General;  Laterality: N/A;  Laparoscopic Nissen repair of hiatal hernia  with lighted bougie  . Esophageal manometry  02/17/2012    Procedure: ESOPHAGEAL MANOMETRY (EM);  Surgeon: Pedro Earls, MD;  [facility];  Service: Endoscopy;  Laterality: N/A;  . Dilation and curettage of uterus  many yrs ago    x 2  . 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 Joint replacement      left  . Open surgical repair of gluteal tendon Right 12/28/2012    Procedure: RIGHT GLUTEAL TENDON REPAIR;  Surgeon: Mauri Pole, MD;  [facility];  Service: Orthopedics;  Laterality: Right;    There were no vitals filed for this visit.  Visit Diagnosis:  Midline low back pain without sciatica  Difficulty walking  Foot drop, left      Subjective Assessment - 03/15/15 1153    Subjective I am cleaning out my house, sometimes I have to crawl around because I can't walk   Currently in Pain? Yes   Pain Score 4    Pain Location Back            [facility] PT Assessment - 03/15/15 0001    Timed Up and Go Test   Normal TUG (seconds) 22                     [facility] Adult PT Treatment/Exercise - 03/15/15 0001    Ambulation/Gait   Gait Comments SPC with supervision only 180 feet x 2, worked on stairs step over step with  hand rail and assist.   High Level Balance   High Level Balance Comments sit to stand activities, trying to not use hand   Lumbar Exercises: Aerobic   Elliptical NuStep Level 6 x 6 minutes   Tread Mill 1.6 mph x 4 minutes   Knee/Hip Exercises: Machines for Strengthening   Cybex Knee Extension 10#   Cybex Knee Flexion 35#   Cybex Leg Press 40# 2x10, left and right individual with 20# x 10 each, then no weight x 10 each leg   Hip Cybex standing pulleys 50# push down bilateral, the right hip was very weak and had difficulty, the leg would go into ER and she could not control it   Other Machine standing 3# hip flexion, abduction and extension, did some standing with knee bent at 90 degrees working on IR of the hip, also with her sitting and tband around ankles working on IR of the hips   Knee/Hip Exercises: Sidelying   Other Sidelying Knee/Hip Exercises feet on ball K2C, rotation and bridges, isometric  abdominal ball crunch, 15# seated rows and lats   Electrical Stimulation   Electrical Stimulation Location low back   Electrical Stimulation Action IFC   Electrical Stimulation Parameters tolerance   Electrical Stimulation Goals Pain                  PT Short Term Goals - 02/16/15 1429    PT SHORT TERM GOAL #1   Title independent with initial HEP   Status Achieved           PT Long Term Goals - 03/15/15 1200    PT LONG TERM GOAL #1   Title decrease Tug time to 15 seconds   Status Partially Met   PT LONG TERM GOAL #2   Title decrease pain 50%   Status Partially Met               Plan - 03/15/15 1158    Clinical Impression Statement She has great difficulty with IR of the hip.  Weakness in the right hip is causing her to have significant gait issues that may lead to back pain   PT Next Visit Plan continue to work on strength and functional gait   Consulted and Agree with Plan of Care Patient        Problem List Patient Active Problem List   Diagnosis Date Noted  . Postoperative anemia due to acute blood loss 12/30/2014  . Essential hypertension, benign 07/04/2014  . Accelerated hypertension 05/05/2014  . Wheezing 03/30/2014  . Pulmonary embolism ([place]) 10/14/2013  . Acute respiratory failure ([place]) 10/14/2013  . Chest pain 10/14/2013  . Anemia 10/14/2013  . PE (pulmonary embolism) 10/14/2013  . Urinary frequency 04/07/2013  . Urinary incontinence 04/07/2013  . Right gluteus tear 12/28/2012  . Generalized anxiety disorder 08/19/2012  . Unspecified constipation 08/19/2012  . GERD (gastroesophageal reflux disease) 08/19/2012  . Overweight (BMI 25.0-29.9) 08/11/2012  . Expected blood loss anemia 08/11/2012  . Knee pain, left 03/31/2012  . Depression 09/10/2011  . S/P Nissen fundoplication (without gastrostomy tube) procedure 07/26/2011  . Chronic cough 01/09/2011  . RESTLESS LEG SYNDROME 05/05/2008  . HYPERLIPIDEMIA 09/14/2007  . Osteopenia  09/14/2007    Sumner Boast., PT 03/15/2015, 12:00 PM  [address] [place], [place], [address] Phone: [phone]   Fax:  [phone]

## 2015-03-21 ENCOUNTER — Encounter: Payer: Self-pay | Admitting: Physical Therapy

## 2015-03-21 ENCOUNTER — Ambulatory Visit: Payer: Medicare Other | Admitting: Physical Therapy

## 2015-03-21 DIAGNOSIS — R262 Difficulty in walking, not elsewhere classified: Secondary | ICD-10-CM

## 2015-03-21 DIAGNOSIS — M545 Low back pain, unspecified: Secondary | ICD-10-CM

## 2015-03-21 DIAGNOSIS — M6289 Other specified disorders of muscle: Secondary | ICD-10-CM | POA: Diagnosis not present

## 2015-03-21 DIAGNOSIS — R29898 Other symptoms and signs involving the musculoskeletal system: Secondary | ICD-10-CM

## 2015-03-21 DIAGNOSIS — M21372 Foot drop, left foot: Secondary | ICD-10-CM

## 2015-03-21 NOTE — Therapy (Signed)
[address] [place], [place], [address] Phone: [phone]   Fax:  [phone]  Physical Therapy Treatment  Patient Details  Name: Felicia Cruz MRN: [id number] Date of Birth: 04/21/1941 No Data Recorded  Encounter Date: 03/21/2015      PT End of Session - 03/21/15 1055    Visit Number 16   Date for PT Re-Evaluation 03/26/15   PT Start Time 1012   PT Stop Time 1110   PT Time Calculation (min) 58 min   Activity Tolerance Patient tolerated treatment well   Behavior During Therapy [facility] for tasks assessed/performed      Past Medical History  Diagnosis Date  . RLS (restless legs syndrome)   . Esophageal stricture 2010  . Osteopenia   . Hyperlipemia   . Arthritis   . Kidney stones   . GERD (gastroesophageal reflux disease)   . Substance abuse     RECOVERING ALCOLHOLIC  . Alcoholism ([place])     HAS NOT HAD [place]  . Cataract   . 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 Hiatal hernia   . DDD (degenerative disc disease), lumbar   . Chronic back pain     Past Surgical History  Procedure Laterality Date  . Lumbar disc surgery  2008  . Facial cosmetic surgery      eyes, nose  . Abdominoplasty    . Liposuction  15 to 20 yrs ago  . Surgery for kidney stones    . Right index finger surgery  06/19/11    AT [facility]  . Laparoscopic nissen fundoplication  9/41/7408    Procedure: LAPAROSCOPIC NISSEN FUNDOPLICATION;  Surgeon: Pedro Earls, MD;  [facility];  Service: General;  Laterality: N/A;  Laparoscopic Nissen repair of hiatal hernia  with lighted bougie  . Esophageal manometry  02/17/2012    Procedure: ESOPHAGEAL MANOMETRY (EM);  Surgeon: Pedro Earls, MD;  [facility];  Service: Endoscopy;  Laterality: N/A;  . Dilation and curettage of uterus  many yrs ago    x 2  . Total knee arthroplasty Left 08/10/2012    Procedure: TOTAL KNEE  ARTHROPLASTY;  Surgeon: Mauri Pole, MD;  [facility];  Service: Orthopedics;  Laterality: Left;  . Joint replacement      left  . Open surgical repair of gluteal tendon Right 12/28/2012    Procedure: RIGHT GLUTEAL TENDON REPAIR;  Surgeon: Mauri Pole, MD;  [facility];  Service: Orthopedics;  Laterality: Right;    There were no vitals filed for this visit.  Visit Diagnosis:  Midline low back pain without sciatica  Difficulty walking  Foot drop, left  Weakness of both hips      Subjective Assessment - 03/21/15 1023    Subjective I am really cramping more in my calves and HS.   Currently in Pain? Yes   Pain Score 5    Pain Location Back  hamstrings   Pain Orientation Lower   Pain Descriptors / Indicators Cramping   Aggravating Factors  walking   Pain Relieving Factors rest                         [facility] Adult PT Treatment/Exercise - 03/21/15 0001    Ambulation/Gait   Gait Comments SPC with supervision only 180 feet x 2, worked on stairs step over step with hand rail and  assist.   High Level Balance   High Level Balance Activities Side stepping;Backward walking;Marching forwards;Tandem walking   Lumbar Exercises: Aerobic   Elliptical NuStep Level 6 x 6 minutes   Tread Mill 1.7 mph x 4 minutes   Knee/Hip Exercises: Machines for Strengthening   Cybex Leg Press 40# 2x10, left and right individual with 40# x 10 each, then no weight x 10 each leg   Other Machine standing 3# hip flexion, abduction and extension, did some standing with knee bent at 90 degrees working on IR of the hip, also with her sitting and tband around ankles working on IR of the hips   Knee/Hip Exercises: Sidelying   Other Sidelying Knee/Hip Exercises feet on ball K2C, rotation and bridges, isometric abdominal ball crunch, 15# seated rows and lats   Acupuncturist Location low back   Electrical Stimulation Action IFC   Electrical Stimulation  Parameters tolerance   Electrical Stimulation Goals Pain   Manual Therapy   Manual therapy comments PROM HS and piriformis                  PT Short Term Goals - 02/16/15 1429    PT SHORT TERM GOAL #1   Title independent with initial HEP   Status Achieved           PT Long Term Goals - 03/15/15 1200    PT LONG TERM GOAL #1   Title decrease Tug time to 15 seconds   Status Partially Met   PT LONG TERM GOAL #2   Title decrease pain 50%   Status Partially Met               Plan - 03/21/15 1057    Clinical Impression Statement Weakness of the hips is one factor that may be causing some of the back pain as she is trying to walk more without walker, uses a SPC.     PT Next Visit Plan continue to work on strength and functional gait   Consulted and Agree with Plan of Care Patient        Problem List Patient Active Problem List   Diagnosis Date Noted  . Postoperative anemia due to acute blood loss 12/30/2014  . Essential hypertension, benign 07/04/2014  . Accelerated hypertension 05/05/2014  . Wheezing 03/30/2014  . Pulmonary embolism ([place]) 10/14/2013  . Acute respiratory failure ([place]) 10/14/2013  . Chest pain 10/14/2013  . Anemia 10/14/2013  . PE (pulmonary embolism) 10/14/2013  . Urinary frequency 04/07/2013  . Urinary incontinence 04/07/2013  . Right gluteus tear 12/28/2012  . Generalized anxiety disorder 08/19/2012  . Unspecified constipation 08/19/2012  . GERD (gastroesophageal reflux disease) 08/19/2012  . Overweight (BMI 25.0-29.9) 08/11/2012  . Expected blood loss anemia 08/11/2012  . Knee pain, left 03/31/2012  . Depression 09/10/2011  . S/P Nissen fundoplication (without gastrostomy tube) procedure 07/26/2011  . Chronic cough 01/09/2011  . RESTLESS LEG SYNDROME 05/05/2008  . HYPERLIPIDEMIA 09/14/2007  . Osteopenia 09/14/2007    Sumner Boast., PT 03/21/2015, 10:59 AM  [facility] [address] [place], [place], [address] Phone: [phone]   Fax:  [phone]  Name: Felicia Cruz MRN: [id number] Date of Birth: 27-Feb-1941

## 2015-03-23 DIAGNOSIS — M76891 Other specified enthesopathies of right lower limb, excluding foot: Secondary | ICD-10-CM | POA: Diagnosis not present

## 2015-03-24 ENCOUNTER — Encounter: Payer: Self-pay | Admitting: Physical Therapy

## 2015-03-24 ENCOUNTER — Ambulatory Visit: Payer: Medicare Other | Admitting: Physical Therapy

## 2015-03-24 DIAGNOSIS — M21372 Foot drop, left foot: Secondary | ICD-10-CM | POA: Diagnosis not present

## 2015-03-24 DIAGNOSIS — R29898 Other symptoms and signs involving the musculoskeletal system: Secondary | ICD-10-CM

## 2015-03-24 DIAGNOSIS — M545 Low back pain, unspecified: Secondary | ICD-10-CM

## 2015-03-24 DIAGNOSIS — R262 Difficulty in walking, not elsewhere classified: Secondary | ICD-10-CM

## 2015-03-24 DIAGNOSIS — M6289 Other specified disorders of muscle: Secondary | ICD-10-CM | POA: Diagnosis not present

## 2015-03-24 NOTE — Therapy (Signed)
[facility] [place], [place], [address] Phone: [phone]   Fax:  [phone]  Physical Therapy Treatment  Patient Details  Name: Felicia Cruz MRN: [id number] Date of Birth: 11/18/1940 No Data Recorded  Encounter Date: 03/24/2015      PT End of Session - 03/24/15 1122    Visit Number 17   Date for PT Re-Evaluation 03/26/15   PT Start Time 1015   PT Stop Time 1120   PT Time Calculation (min) 65 min   Activity Tolerance Patient tolerated treatment well   Behavior During Therapy [facility] for tasks assessed/performed      Past Medical History  Diagnosis Date  . RLS (restless legs syndrome)   . Esophageal stricture 2010  . Osteopenia   . Hyperlipemia   . Arthritis   . Kidney stones   . GERD (gastroesophageal reflux disease)   . Substance abuse     RECOVERING ALCOLHOLIC  . Alcoholism ([place])     HAS NOT HAD [place]  . Cataract   . 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 Hiatal hernia   . DDD (degenerative disc disease), lumbar   . Chronic back pain     Past Surgical History  Procedure Laterality Date  . Lumbar disc surgery  2008  . Facial cosmetic surgery      eyes, nose  . Abdominoplasty    . Liposuction  15 to 20 yrs ago  . Surgery for kidney stones    . Right index finger surgery  06/19/11    AT [facility]  . Laparoscopic nissen fundoplication  2/50/5397    Procedure: LAPAROSCOPIC NISSEN FUNDOPLICATION;  Surgeon: Pedro Earls, MD;  [facility];  Service: General;  Laterality: N/A;  Laparoscopic Nissen repair of hiatal hernia  with lighted bougie  . Esophageal manometry  02/17/2012    Procedure: ESOPHAGEAL MANOMETRY (EM);  Surgeon: Pedro Earls, MD;  [facility];  Service: Endoscopy;  Laterality: N/A;  . Dilation and curettage of uterus  many yrs ago    x 2  . Total knee arthroplasty Left 08/10/2012    Procedure: TOTAL KNEE  ARTHROPLASTY;  Surgeon: Mauri Pole, MD;  [facility];  Service: Orthopedics;  Laterality: Left;  . Joint replacement      left  . Open surgical repair of gluteal tendon Right 12/28/2012    Procedure: RIGHT GLUTEAL TENDON REPAIR;  Surgeon: Mauri Pole, MD;  [facility];  Service: Orthopedics;  Laterality: Right;    There were no vitals filed for this visit.  Visit Diagnosis:  Midline low back pain without sciatica  Difficulty walking  Foot drop, left  Weakness of both hips      Subjective Assessment - 03/24/15 1112    Subjective Patient reports that she saw the MD that did the surgery on her hip.  He wanted [place] to really focus on the hip strength but not allow any cheating   Currently in Pain? Yes   Pain Score 4    Pain Location Hip   Pain Orientation Right   Pain Descriptors / Indicators Aching                         [facility] Adult PT Treatment/Exercise - 03/24/15 0001    Lumbar Exercises: Aerobic   Elliptical NuStep Level 6 x 6 minutes   Tread Mill 1.7  mph x 4 minutes   Knee/Hip Exercises: Supine   Bridges 3 sets;10 reps   Bridges with [employer] 3 sets;10 reps   Other Supine Knee/Hip Exercises use of sliding board hip abduction, adduction, IR with cues verbal and tactile to decrease hip rotation or elevation   Knee/Hip Exercises: Sidelying   Hip ABduction 3 sets;10 reps   Hip ABduction Limitations some assist needed for the right hip used tband to assist   Clams 2x15 each side   Other Sidelying Knee/Hip Exercises feet on ball K2C, rotation and bridges, isometric abdominal ball crunch, 15# seated rows and lats                  PT Short Term Goals - 02/16/15 1429    PT SHORT TERM GOAL #1   Title independent with initial HEP   Status Achieved           PT Long Term Goals - 03/15/15 1200    PT LONG TERM GOAL #1   Title decrease Tug time to 15 seconds   Status Partially Met   PT LONG TERM GOAL #2   Title decrease pain  50%   Status Partially Met               Plan - 03/24/15 1123    Clinical Impression Statement Really needs cue to decrease compensation of the hip, very weak gluteus medius and minumus, she tends to cheat with hip flexor and TFL   PT Next Visit Plan really focus on gluteus medius and minimus, write renewal   Consulted and Agree with Plan of Care Patient        Problem List Patient Active Problem List   Diagnosis Date Noted  . Postoperative anemia due to acute blood loss 12/30/2014  . Essential hypertension, benign 07/04/2014  . Accelerated hypertension 05/05/2014  . Wheezing 03/30/2014  . Pulmonary embolism ([place]) 10/14/2013  . Acute respiratory failure ([place]) 10/14/2013  . Chest pain 10/14/2013  . Anemia 10/14/2013  . PE (pulmonary embolism) 10/14/2013  . Urinary frequency 04/07/2013  . Urinary incontinence 04/07/2013  . Right gluteus tear 12/28/2012  . Generalized anxiety disorder 08/19/2012  . Unspecified constipation 08/19/2012  . GERD (gastroesophageal reflux disease) 08/19/2012  . Overweight (BMI 25.0-29.9) 08/11/2012  . Expected blood loss anemia 08/11/2012  . Knee pain, left 03/31/2012  . Depression 09/10/2011  . S/P Nissen fundoplication (without gastrostomy tube) procedure 07/26/2011  . Chronic cough 01/09/2011  . RESTLESS LEG SYNDROME 05/05/2008  . HYPERLIPIDEMIA 09/14/2007  . Osteopenia 09/14/2007    Sumner Boast., PT 03/24/2015, 11:25 AM  [place] [address] [place], [place], [address] Phone: [phone]   Fax:  [phone]  Name: ANGELICA FRANDSEN MRN: [id number] Date of Birth: [birth year]

## 2015-03-28 ENCOUNTER — Ambulatory Visit: Payer: Medicare Other | Admitting: Physical Therapy

## 2015-03-28 ENCOUNTER — Encounter: Payer: Self-pay | Admitting: Physical Therapy

## 2015-03-28 DIAGNOSIS — R262 Difficulty in walking, not elsewhere classified: Secondary | ICD-10-CM | POA: Diagnosis not present

## 2015-03-28 DIAGNOSIS — M21372 Foot drop, left foot: Secondary | ICD-10-CM | POA: Diagnosis not present

## 2015-03-28 DIAGNOSIS — M545 Low back pain, unspecified: Secondary | ICD-10-CM

## 2015-03-28 DIAGNOSIS — M6289 Other specified disorders of muscle: Secondary | ICD-10-CM | POA: Diagnosis not present

## 2015-03-28 DIAGNOSIS — R29898 Other symptoms and signs involving the musculoskeletal system: Secondary | ICD-10-CM

## 2015-03-28 NOTE — Therapy (Signed)
[address] [place], [place], [address] Phone: [phone]   Fax:  [phone]  Physical Therapy Treatment  Patient Details  Name: Felicia Cruz MRN: [id number] Date of Birth: [birth year] No Data Recorded  Encounter Date: 03/28/2015      PT End of Session - 03/28/15 0844    Visit Number 18   Date for PT Re-Evaluation 04/28/15   PT Start Time 0800   PT Stop Time 0855   PT Time Calculation (min) 55 min   Activity Tolerance Patient tolerated treatment well   Behavior During Therapy [facility] for tasks assessed/performed      Past Medical History  Diagnosis Date  . RLS (restless legs syndrome)   . Esophageal stricture 2010  . Osteopenia   . Hyperlipemia   . Arthritis   . Kidney stones   . GERD (gastroesophageal reflux disease)   . Substance abuse     RECOVERING ALCOLHOLIC  . Alcoholism ([place])     HAS NOT HAD [place]  . Cataract   . 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 Hiatal hernia   . DDD (degenerative disc disease), lumbar   . Chronic back pain     Past Surgical History  Procedure Laterality Date  . Lumbar disc surgery  2008  . Facial cosmetic surgery      eyes, nose  . Abdominoplasty    . Liposuction  15 to 20 yrs ago  . Surgery for kidney stones    . Right index finger surgery  06/19/11    AT [facility]  . Laparoscopic nissen fundoplication  6/96/2952    Procedure: LAPAROSCOPIC NISSEN FUNDOPLICATION;  Surgeon: Pedro Earls, MD;  [facility];  Service: General;  Laterality: N/A;  Laparoscopic Nissen repair of hiatal hernia  with lighted bougie  . Esophageal manometry  02/17/2012    Procedure: ESOPHAGEAL MANOMETRY (EM);  Surgeon: Pedro Earls, MD;  [facility];  Service: Endoscopy;  Laterality: N/A;  . Dilation and curettage of uterus  many yrs ago    x 2  . Total knee arthroplasty Left 08/10/2012    Procedure: TOTAL KNEE  ARTHROPLASTY;  Surgeon: Mauri Pole, MD;  [facility];  Service: Orthopedics;  Laterality: Left;  . Joint replacement      left  . Open surgical repair of gluteal tendon Right 12/28/2012    Procedure: RIGHT GLUTEAL TENDON REPAIR;  Surgeon: Mauri Pole, MD;  [facility];  Service: Orthopedics;  Laterality: Right;    There were no vitals filed for this visit.  Visit Diagnosis:  Midline low back pain without sciatica - Plan: PT plan of care cert/re-cert  Difficulty walking - Plan: PT plan of care cert/re-cert  Foot drop, left - Plan: PT plan of care cert/re-cert  Weakness of both hips - Plan: PT plan of care cert/re-cert      Subjective Assessment - 03/28/15 0756    Subjective I was a little sore after last time in the hips.   Currently in Pain? Yes   Pain Score 4    Pain Location Back   Pain Orientation Lower   Pain Descriptors / Indicators Aching;Tightness   Aggravating Factors  walking   Pain Relieving Factors rest and the heat            [facility] PT Assessment - 03/28/15 0001    Strength   Overall Strength Comments right  hip abduction without any compendation is 3/5, left ankle 1+/5                     [facility] Adult PT Treatment/Exercise - 03/28/15 0001    Lumbar Exercises: Aerobic   Elliptical NuStep Level 6 x 6 minutes   Tread Mill 1.7 mph x 4 minutes   Knee/Hip Exercises: Public affairs consultant 3 reps;30 seconds   Hip Flexor Stretch 3 reps;20 seconds   Knee/Hip Exercises: Machines for Strengthening   Cybex Leg Press 40# 2x10, left and right individual with 40# x 10 each, then no weight x 10 each leg   Other Machine --   Knee/Hip Exercises: Supine   Heel Slides 3 sets;15 reps   Heel Slides Limitations worked on abduction wiht sliding board under foot   Bridges 3 sets;10 reps   Bridges with [employer] 3 sets;10 reps   Other Supine Knee/Hip Exercises use of sliding board hip abduction, adduction, IR with cues verbal and tactile to  decrease hip rotation or elevation   Knee/Hip Exercises: Sidelying   Hip ABduction 3 sets;10 reps   Hip ABduction Limitations some assist needed for the right hip used tband to assist   Clams 2x15 each side   Acupuncturist Location low back   Electrical Stimulation Action IFC   Electrical Stimulation Parameters tolerance   Electrical Stimulation Goals Pain                  PT Short Term Goals - 02/16/15 1429    PT SHORT TERM GOAL #1   Title independent with initial HEP   Status Achieved           PT Long Term Goals - 03/28/15 0846    PT LONG TERM GOAL #1   Title decrease Tug time to 15 seconds   Status Partially Met   PT LONG TERM GOAL #2   Title decrease pain 50%   Status Partially Met   PT LONG TERM GOAL #3   Title walk with a SPC x 200 feet   Status Achieved   PT LONG TERM GOAL #4   Title demonstrate active DF to 0 degrees   Status Partially Met               Plan - 03/28/15 0844    Clinical Impression Statement Patient is very tight in the quads and hip flexors, very weak gluteus medius, tends to use TFL and hip flexor to cheat, needs a lot of cues to decrease the compensation   PT Next Visit Plan really focus on gluteus medius and minimus, renewal writtne today   Consulted and Agree with Plan of Care Patient        Problem List Patient Active Problem List   Diagnosis Date Noted  . Postoperative anemia due to acute blood loss 12/30/2014  . Essential hypertension, benign 07/04/2014  . Accelerated hypertension 05/05/2014  . Wheezing 03/30/2014  . Pulmonary embolism ([place]) 10/14/2013  . Acute respiratory failure ([place]) 10/14/2013  . Chest pain 10/14/2013  . Anemia 10/14/2013  . PE (pulmonary embolism) 10/14/2013  . Urinary frequency 04/07/2013  . Urinary incontinence 04/07/2013  . Right gluteus tear 12/28/2012  . Generalized anxiety disorder 08/19/2012  . Unspecified constipation 08/19/2012  . GERD  (gastroesophageal reflux disease) 08/19/2012  . Overweight (BMI 25.0-29.9) 08/11/2012  . Expected blood loss anemia 08/11/2012  . Knee pain, left 03/31/2012  . Depression 09/10/2011  .  S/P Nissen fundoplication (without gastrostomy tube) procedure 07/26/2011  . Chronic cough 01/09/2011  . RESTLESS LEG SYNDROME 05/05/2008  . HYPERLIPIDEMIA 09/14/2007  . Osteopenia 09/14/2007    Sumner Boast., PT 03/28/2015, 8:52 AM  [facility] [address] [place], [place], [address] Phone: [phone]   Fax:  [phone]  Name: Felicia Cruz MRN: [id number] Date of Birth: Jun 22, 1940

## 2015-03-29 ENCOUNTER — Encounter: Payer: Self-pay | Admitting: Physical Therapy

## 2015-03-29 ENCOUNTER — Ambulatory Visit: Payer: Medicare Other | Admitting: Physical Therapy

## 2015-03-29 DIAGNOSIS — M21372 Foot drop, left foot: Secondary | ICD-10-CM | POA: Diagnosis not present

## 2015-03-29 DIAGNOSIS — M6289 Other specified disorders of muscle: Secondary | ICD-10-CM | POA: Diagnosis not present

## 2015-03-29 DIAGNOSIS — R262 Difficulty in walking, not elsewhere classified: Secondary | ICD-10-CM

## 2015-03-29 DIAGNOSIS — M545 Low back pain, unspecified: Secondary | ICD-10-CM

## 2015-03-29 DIAGNOSIS — R29898 Other symptoms and signs involving the musculoskeletal system: Secondary | ICD-10-CM

## 2015-03-29 NOTE — Therapy (Signed)
[facility] [address] [place], [place], [address] Phone: [phone]   Fax:  [phone]  Physical Therapy Treatment  Patient Details  Name: Felicia Cruz MRN: [id number] Date of Birth: [birth year] No Data Recorded  Encounter Date: 03/29/2015      PT End of Session - 03/29/15 0936    Visit Number 19   Date for PT Re-Evaluation 04/28/15   PT Start Time 0843   PT Stop Time 0945   PT Time Calculation (min) 62 min   Activity Tolerance Patient tolerated treatment well   Behavior During Therapy [facility] for tasks assessed/performed      Past Medical History  Diagnosis Date  . RLS (restless legs syndrome)   . Esophageal stricture 2010  . Osteopenia   . Hyperlipemia   . Arthritis   . Kidney stones   . GERD (gastroesophageal reflux disease)   . Substance abuse     RECOVERING ALCOLHOLIC  . Alcoholism ([facility])     HAS NOT HAD DRINK SINCE 1995  . Cataract   . 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 Hiatal hernia   . DDD (degenerative disc disease), lumbar   . Chronic back pain     Past Surgical History  Procedure Laterality Date  . Lumbar disc surgery  2008  . Facial cosmetic surgery      eyes, nose  . Abdominoplasty    . Liposuction  15 to 20 yrs ago  . Surgery for kidney stones    . Right index finger surgery  06/19/11    AT [facility]  . Laparoscopic nissen fundoplication  06/26/2011    Procedure: LAPAROSCOPIC NISSEN FUNDOPLICATION;  Surgeon: Valarie Merino, MD;  [facility];  Service: General;  Laterality: N/A;  Laparoscopic Nissen repair of hiatal hernia  with lighted bougie  . Esophageal manometry  02/17/2012    Procedure: ESOPHAGEAL MANOMETRY (EM);  Surgeon: Valarie Merino, MD;  [facility];  Service: Endoscopy;  Laterality: N/A;  . Dilation and curettage of uterus  many yrs ago    x 2  . Total knee arthroplasty Left 08/10/2012    Procedure: TOTAL KNEE  ARTHROPLASTY;  Surgeon: Shelda Pal, MD;  [facility];  Service: Orthopedics;  Laterality: Left;  . Joint replacement      left  . Open surgical repair of gluteal tendon Right 12/28/2012    Procedure: RIGHT GLUTEAL TENDON REPAIR;  Surgeon: Shelda Pal, MD;  [facility];  Service: Orthopedics;  Laterality: Right;    There were no vitals filed for this visit.  Visit Diagnosis:  Midline low back pain without sciatica  Difficulty walking  Foot drop, left  Weakness of both hips      Subjective Assessment - 03/29/15 0933    Subjective I really felt like we worked yesterday, I was sore in the mms   Currently in Pain? Yes   Pain Score 5    Pain Location Back   Pain Orientation Right;Lower   Pain Descriptors / Indicators Aching                         [facility] Adult PT Treatment/Exercise - 03/29/15 0001    Lumbar Exercises: Aerobic   Elliptical NuStep Level 6 x 6 minutes   Tread Mill 1.7 mph x 5 minutes   Knee/Hip Exercises: Dentist 3 reps;30 seconds  Hip Flexor Stretch 3 reps;20 seconds   Knee/Hip Exercises: Supine   Heel Slides 3 sets;15 reps   Heel Slides Limitations worked on abduction wiht sliding board under foot   Bridges 3 sets;10 reps   Bridges with [employer] 3 sets;10 reps   Other Supine Knee/Hip Exercises use of sliding board hip abduction, adduction, IR with cues verbal and tactile to decrease hip rotation or elevation   Other Supine Knee/Hip Exercises 8" step ups, single leg bridge with assist   Knee/Hip Exercises: Sidelying   Hip ABduction 3 sets;10 reps   Hip ABduction Limitations some assist needed for the right hip used tband to assist   Other Sidelying Knee/Hip Exercises feet on ball K2C, rotation and bridges, isometric abdominal ball crunch, 15# seated rows and lats   Electrical Stimulation   Electrical Stimulation Location low back   Electrical Stimulation Action IFC   Electrical Stimulation Parameters  tolerance   Electrical Stimulation Goals Pain                  PT Short Term Goals - 02/16/15 1429    PT SHORT TERM GOAL #1   Title independent with initial HEP   Status Achieved           PT Long Term Goals - 03/28/15 0846    PT LONG TERM GOAL #1   Title decrease Tug time to 15 seconds   Status Partially Met   PT LONG TERM GOAL #2   Title decrease pain 50%   Status Partially Met   PT LONG TERM GOAL #3   Title walk with a SPC x 200 feet   Status Achieved   PT LONG TERM GOAL #4   Title demonstrate active DF to 0 degrees   Status Partially Met               Plan - 03/29/15 0937    Clinical Impression Statement Continue to work on flexibility and the hip strength to increase function. and make the gait better.   PT Next Visit Plan really focus on gluteus medius and minimus, renewal written today   Consulted and Agree with Plan of Care Patient        Problem List Patient Active Problem List   Diagnosis Date Noted  . Postoperative anemia due to acute blood loss 12/30/2014  . Essential hypertension, benign 07/04/2014  . Accelerated hypertension 05/05/2014  . Wheezing 03/30/2014  . Pulmonary embolism ([place]) 10/14/2013  . Acute respiratory failure ([place]) 10/14/2013  . Chest pain 10/14/2013  . Anemia 10/14/2013  . PE (pulmonary embolism) 10/14/2013  . Urinary frequency 04/07/2013  . Urinary incontinence 04/07/2013  . Right gluteus tear 12/28/2012  . Generalized anxiety disorder 08/19/2012  . Unspecified constipation 08/19/2012  . GERD (gastroesophageal reflux disease) 08/19/2012  . Overweight (BMI 25.0-29.9) 08/11/2012  . Expected blood loss anemia 08/11/2012  . Knee pain, left 03/31/2012  . Depression 09/10/2011  . S/P Nissen fundoplication (without gastrostomy tube) procedure 07/26/2011  . Chronic cough 01/09/2011  . RESTLESS LEG SYNDROME 05/05/2008  . HYPERLIPIDEMIA 09/14/2007  . Osteopenia 09/14/2007    Sumner Boast.,  PT 03/29/2015, 9:43 AM  [place] [address] [place], [place], [address] Phone: [phone]   Fax:  [phone]  Name: Felicia Cruz MRN: [id number] Date of Birth: December 27, 1940

## 2015-04-04 ENCOUNTER — Encounter: Payer: Self-pay | Admitting: Physical Therapy

## 2015-04-04 ENCOUNTER — Ambulatory Visit: Payer: Medicare Other | Attending: Neurological Surgery | Admitting: Physical Therapy

## 2015-04-04 DIAGNOSIS — R262 Difficulty in walking, not elsewhere classified: Secondary | ICD-10-CM | POA: Diagnosis not present

## 2015-04-04 DIAGNOSIS — M6289 Other specified disorders of muscle: Secondary | ICD-10-CM | POA: Diagnosis not present

## 2015-04-04 DIAGNOSIS — M545 Low back pain, unspecified: Secondary | ICD-10-CM

## 2015-04-04 DIAGNOSIS — M21372 Foot drop, left foot: Secondary | ICD-10-CM | POA: Insufficient documentation

## 2015-04-04 DIAGNOSIS — R29898 Other symptoms and signs involving the musculoskeletal system: Secondary | ICD-10-CM

## 2015-04-04 NOTE — Therapy (Signed)
[address] [place], [place], [address] Phone: [phone]   Fax:  [phone]  Physical Therapy Treatment  Patient Details  Name: Felicia Cruz MRN: [id number] Date of Birth: Oct 17, 1940 No Data Recorded  Encounter Date: 04/04/2015      PT End of Session - 04/04/15 1113    Visit Number 20   Date for PT Re-Evaluation 04/28/15   PT Start Time 1016   PT Stop Time 1115   PT Time Calculation (min) 59 min   Activity Tolerance Patient tolerated treatment well   Behavior During Therapy [facility] for tasks assessed/performed      Past Medical History  Diagnosis Date  . RLS (restless legs syndrome)   . Esophageal stricture 2010  . Osteopenia   . Hyperlipemia   . Arthritis   . Kidney stones   . GERD (gastroesophageal reflux disease)   . Substance abuse     RECOVERING ALCOLHOLIC  . Alcoholism ([place])     HAS NOT HAD [place]  . Cataract   . 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 Hiatal hernia   . DDD (degenerative disc disease), lumbar   . Chronic back pain     Past Surgical History  Procedure Laterality Date  . Lumbar disc surgery  2008  . Facial cosmetic surgery      eyes, nose  . Abdominoplasty    . Liposuction  15 to 20 yrs ago  . Surgery for kidney stones    . Right index finger surgery  06/19/11    AT [facility]  . Laparoscopic nissen fundoplication  01/10/1750    Procedure: LAPAROSCOPIC NISSEN FUNDOPLICATION;  Surgeon: Pedro Earls, MD;  [facility];  Service: General;  Laterality: N/A;  Laparoscopic Nissen repair of hiatal hernia  with lighted bougie  . Esophageal manometry  02/17/2012    Procedure: ESOPHAGEAL MANOMETRY (EM);  Surgeon: Pedro Earls, MD;  [facility];  Service: Endoscopy;  Laterality: N/A;  . Dilation and curettage of uterus  many yrs ago    x 2  . Total knee arthroplasty Left 08/10/2012    Procedure: TOTAL KNEE  ARTHROPLASTY;  Surgeon: Mauri Pole, MD;  [facility];  Service: Orthopedics;  Laterality: Left;  . Joint replacement      left  . Open surgical repair of gluteal tendon Right 12/28/2012    Procedure: RIGHT GLUTEAL TENDON REPAIR;  Surgeon: Mauri Pole, MD;  [facility];  Service: Orthopedics;  Laterality: Right;    There were no vitals filed for this visit.  Visit Diagnosis:  Midline low back pain without sciatica  Difficulty walking  Foot drop, left  Weakness of both hips      Subjective Assessment - 04/04/15 1025    Subjective Patient [place] she tripped over the sewer clean out and had a fall, she reports a little sore.   Currently in Pain? Yes   Pain Score 6    Pain Location Back   Pain Orientation Right;Lower   Pain Descriptors / Indicators Aching   Pain Type Chronic pain   Pain Onset More than a month ago   Pain Frequency Constant                         [facility] Adult PT Treatment/Exercise - 04/04/15 0001  Lumbar Exercises: Aerobic   Elliptical NuStep Level 6 x 6 minutes   Knee/Hip Exercises: Stretches   Sports administrator 3 reps;30 seconds   Hip Flexor Stretch 3 reps;20 seconds   Piriformis Stretch 30 seconds;3 reps   Knee/Hip Exercises: Machines for Strengthening   Cybex Leg Press 40# 2x10, left and right individual with 40# x 10 each, then no weight x 10 each leg, then 20# each leg   Knee/Hip Exercises: Supine   Heel Slides 3 sets;15 reps   Heel Slides Limitations worked on abduction wiht sliding board under foot   Bridges 3 sets;10 reps   Other Supine Knee/Hip Exercises use of sliding board hip abduction, adduction, IR with cues verbal and tactile to decrease hip rotation or elevation   Other Supine Knee/Hip Exercises hip IR   Knee/Hip Exercises: Sidelying   Hip ABduction 3 sets;10 reps   Hip ABduction Limitations some assist needed for the right hip used tband to assist   Clams 2x15  each side   Electrical Stimulation   Electrical Stimulation Location low back   Electrical Stimulation Action IFC   Electrical Stimulation Parameters tolerance   Electrical Stimulation Goals Pain                  PT Short Term Goals - 02/16/15 1429    PT SHORT TERM GOAL #1   Title independent with initial HEP   Status Achieved           PT Long Term Goals - 2015/04/22 1125    PT LONG TERM GOAL #4   Title demonstrate active DF to 0 degrees   Status On-going               Plan - Apr 22, 2015 1115    Clinical Impression Statement The right gluteus medius and minimus remain very weak, she seems to be gaining better control but is very weak.  The fall seems to have caused a little soreness but no lasting effects, does have a black eye and scrapes on her face   PT Next Visit Plan really focus on gluteus medius and minimus   Consulted and Agree with Plan of Care Patient          G-Codes - April 22, 2015 1125    Functional Assessment Tool Used foto   Functional Limitation Mobility: Walking and moving around   Mobility: Walking and Moving Around Current Status [phone]) At least 40 percent but less than 60 percent impaired, limited or restricted   Mobility: Walking and Moving Around Goal Status [phone]) At least 40 percent but less than 60 percent impaired, limited or restricted      Problem List Patient Active Problem List   Diagnosis Date Noted  . Postoperative anemia due to acute blood loss 12/30/2014  . Essential hypertension, benign 07/04/2014  . Accelerated hypertension 05/05/2014  . Wheezing 03/30/2014  . Pulmonary embolism ([place]) 10/14/2013  . Acute respiratory failure ([place]) 10/14/2013  . Chest pain 10/14/2013  . Anemia 10/14/2013  . PE (pulmonary embolism) 10/14/2013  . Urinary frequency 04/07/2013  . Urinary incontinence 04/07/2013  . Right gluteus tear 12/28/2012  . Generalized anxiety disorder 08/19/2012  . Unspecified constipation 08/19/2012  . GERD  (gastroesophageal reflux disease) 08/19/2012  . Overweight (BMI 25.0-29.9) 08/11/2012  . Expected blood loss anemia 08/11/2012  . Knee pain, left 03/31/2012  . Depression 09/10/2011  . S/P Nissen fundoplication (without gastrostomy tube) procedure 07/26/2011  . Chronic cough 01/09/2011  . RESTLESS LEG SYNDROME 05/05/2008  . HYPERLIPIDEMIA  09/14/2007  . Osteopenia 09/14/2007    Sumner Boast., PT 04/04/2015, 11:39 AM  [address] [place], [place], [address] Phone: [phone]   Fax:  [phone]  Name: INDEA DEARMAN MRN: [id number] Date of Birth: 1941/04/23

## 2015-04-06 ENCOUNTER — Encounter: Payer: Self-pay | Admitting: Physical Therapy

## 2015-04-06 ENCOUNTER — Ambulatory Visit: Payer: Medicare Other | Admitting: Physical Therapy

## 2015-04-06 DIAGNOSIS — M6289 Other specified disorders of muscle: Secondary | ICD-10-CM | POA: Diagnosis not present

## 2015-04-06 DIAGNOSIS — M545 Low back pain, unspecified: Secondary | ICD-10-CM

## 2015-04-06 DIAGNOSIS — M21372 Foot drop, left foot: Secondary | ICD-10-CM

## 2015-04-06 DIAGNOSIS — R262 Difficulty in walking, not elsewhere classified: Secondary | ICD-10-CM | POA: Diagnosis not present

## 2015-04-06 DIAGNOSIS — R29898 Other symptoms and signs involving the musculoskeletal system: Secondary | ICD-10-CM

## 2015-04-06 NOTE — Therapy (Signed)
[address] [place], [place], [address] Phone: [phone]   Fax:  [phone]  Physical Therapy Treatment  Patient Details  Name: Felicia Cruz MRN: [id number] Date of Birth: 10-19-1940 No Data Recorded  Encounter Date: 04/06/2015      PT End of Session - 04/06/15 1137    Visit Number 21   Date for PT Re-Evaluation 04/28/15   PT Start Time 1057   PT Stop Time 1158   PT Time Calculation (min) 61 min   Activity Tolerance Patient tolerated treatment well   Behavior During Therapy [facility] for tasks assessed/performed      Past Medical History  Diagnosis Date  . RLS (restless legs syndrome)   . Esophageal stricture 2010  . Osteopenia   . Hyperlipemia   . Arthritis   . Kidney stones   . GERD (gastroesophageal reflux disease)   . Substance abuse     RECOVERING ALCOLHOLIC  . Alcoholism ([place])     HAS NOT HAD [place]  . Cataract   . 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 Hiatal hernia   . DDD (degenerative disc disease), lumbar   . Chronic back pain     Past Surgical History  Procedure Laterality Date  . Lumbar disc surgery  2008  . Facial cosmetic surgery      eyes, nose  . Abdominoplasty    . Liposuction  15 to 20 yrs ago  . Surgery for kidney stones    . Right index finger surgery  06/19/11    AT [facility]  . Laparoscopic nissen fundoplication  4/65/0354    Procedure: LAPAROSCOPIC NISSEN FUNDOPLICATION;  Surgeon: Pedro Earls, MD;  [facility];  Service: General;  Laterality: N/A;  Laparoscopic Nissen repair of hiatal hernia  with lighted bougie  . Esophageal manometry  02/17/2012    Procedure: ESOPHAGEAL MANOMETRY (EM);  Surgeon: Pedro Earls, MD;  [facility];  Service: Endoscopy;  Laterality: N/A;  . Dilation and curettage of uterus  many yrs ago    x 2  . Total knee arthroplasty Left 08/10/2012    Procedure: TOTAL KNEE  ARTHROPLASTY;  Surgeon: Mauri Pole, MD;  [facility];  Service: Orthopedics;  Laterality: Left;  . Joint replacement      left  . Open surgical repair of gluteal tendon Right 12/28/2012    Procedure: RIGHT GLUTEAL TENDON REPAIR;  Surgeon: Mauri Pole, MD;  [facility];  Service: Orthopedics;  Laterality: Right;    There were no vitals filed for this visit.  Visit Diagnosis:  Midline low back pain without sciatica  Difficulty walking  Foot drop, left  Weakness of both hips      Subjective Assessment - 04/06/15 1104    Subjective My back really hurt after the last visit, I don't know if we did anything different but I was really sore.   Currently in Pain? Yes   Pain Score 5    Pain Location Back   Pain Orientation Right;Lower   Pain Descriptors / Indicators Aching   Pain Type Chronic pain   Aggravating Factors  the fall increased some pain   Pain Relieving Factors rest, heat and easy exercises                         [facility] Adult PT Treatment/Exercise - 04/06/15 0001  Ambulation/Gait   Gait Comments SPC outside around 1/2 the front [place] working on safety with uneven surfaces   Lumbar Exercises: Aerobic   Elliptical NuStep Level 6 x 6 minutes   Knee/Hip Exercises: Dentist 3 reps;30 seconds   Hip Flexor Stretch 3 reps;20 seconds   Piriformis Stretch 30 seconds;3 reps   Knee/Hip Exercises: Machines for Strengthening   Cybex Knee Extension 10#   Cybex Knee Flexion 35#   Cybex Leg Press 20# 2x10 bilateral, 20# single leg 2x10 each   Knee/Hip Exercises: Supine   Heel Slides 3 sets;15 reps   Heel Slides Limitations worked on abduction wiht sliding board under foot   Bridges 3 sets;10 reps   Bridges with [employer] 3 sets;10 reps   Other Supine Knee/Hip Exercises use of sliding board hip abduction, adduction, IR with cues verbal and tactile to decrease hip rotation or elevation   Other Supine Knee/Hip Exercises hip IR    Knee/Hip Exercises: Sidelying   Hip ABduction 3 sets;10 reps   Hip ABduction Limitations some assist needed for the right hip used tband to assist   Programme researcher, broadcasting/film/video Location low back   Electrical Stimulation Action IFC   Electrical Stimulation Parameters tolerance   Electrical Stimulation Goals Pain                  PT Short Term Goals - 02/16/15 1429    PT SHORT TERM GOAL #1   Title independent with initial HEP   Status Achieved           PT Long Term Goals - 04/06/15 1139    PT LONG TERM GOAL #1   Title decrease Tug time to 15 seconds   Status Partially Met   PT LONG TERM GOAL #2   Title decrease pain 50%   Status Partially Met               Plan - 04/06/15 1138    Clinical Impression Statement Having some increased low back pain, she is very tight in the adductors and in the internal rotators.  She remains weak in the hip abductors.   PT Next Visit Plan really focus on gluteus medius and minimus   Consulted and Agree with Plan of Care Patient        Problem List Patient Active Problem List   Diagnosis Date Noted  . Postoperative anemia due to acute blood loss 12/30/2014  . Essential hypertension, benign 07/04/2014  . Accelerated hypertension 05/05/2014  . Wheezing 03/30/2014  . Pulmonary embolism ([facility]) 10/14/2013  . Acute respiratory failure ([facility]) 10/14/2013  . Chest pain 10/14/2013  . Anemia 10/14/2013  . PE (pulmonary embolism) 10/14/2013  . Urinary frequency 04/07/2013  . Urinary incontinence 04/07/2013  . Right gluteus tear 12/28/2012  . Generalized anxiety disorder 08/19/2012  . Unspecified constipation 08/19/2012  . GERD (gastroesophageal reflux disease) 08/19/2012  . Overweight (BMI 25.0-29.9) 08/11/2012  . Expected blood loss anemia 08/11/2012  . Knee pain, left 03/31/2012  . Depression 09/10/2011  . S/P Nissen fundoplication (without gastrostomy tube) procedure 07/26/2011  . Chronic cough  01/09/2011  . RESTLESS LEG SYNDROME 05/05/2008  . HYPERLIPIDEMIA 09/14/2007  . Osteopenia 09/14/2007    Jearld Lesch., PT 04/06/2015, 11:40 AM  [facility] [address] [place], [place], [address] Phone: [phone]   Fax:  [phone]  Name: Felicia Cruz MRN: [id number] Date of Birth: 1940/07/17

## 2015-04-11 ENCOUNTER — Ambulatory Visit: Payer: Medicare Other | Admitting: Physical Therapy

## 2015-04-11 ENCOUNTER — Encounter: Payer: Self-pay | Admitting: Physical Therapy

## 2015-04-11 DIAGNOSIS — M21372 Foot drop, left foot: Secondary | ICD-10-CM | POA: Diagnosis not present

## 2015-04-11 DIAGNOSIS — I10 Essential (primary) hypertension: Secondary | ICD-10-CM | POA: Diagnosis not present

## 2015-04-11 DIAGNOSIS — M545 Low back pain, unspecified: Secondary | ICD-10-CM

## 2015-04-11 DIAGNOSIS — M6289 Other specified disorders of muscle: Secondary | ICD-10-CM | POA: Diagnosis not present

## 2015-04-11 DIAGNOSIS — R29898 Other symptoms and signs involving the musculoskeletal system: Secondary | ICD-10-CM

## 2015-04-11 DIAGNOSIS — Z7409 Other reduced mobility: Secondary | ICD-10-CM | POA: Diagnosis not present

## 2015-04-11 DIAGNOSIS — G959 Disease of spinal cord, unspecified: Secondary | ICD-10-CM | POA: Diagnosis not present

## 2015-04-11 DIAGNOSIS — R262 Difficulty in walking, not elsewhere classified: Secondary | ICD-10-CM | POA: Diagnosis not present

## 2015-04-11 DIAGNOSIS — M5106 Intervertebral disc disorders with myelopathy, lumbar region: Secondary | ICD-10-CM | POA: Diagnosis not present

## 2015-04-11 DIAGNOSIS — R269 Unspecified abnormalities of gait and mobility: Secondary | ICD-10-CM | POA: Diagnosis not present

## 2015-04-11 DIAGNOSIS — R531 Weakness: Secondary | ICD-10-CM | POA: Diagnosis not present

## 2015-04-11 NOTE — Therapy (Signed)
[address] [place], [place], [address] Phone: [phone]   Fax:  [phone]  Physical Therapy Treatment  Patient Details  Name: Felicia Cruz MRN: [id number] Date of Birth: 1941/01/11 No Data Recorded  Encounter Date: 04/11/2015      PT End of Session - 04/11/15 0947    Visit Number 22   Date for PT Re-Evaluation 04/28/15   PT Start Time 0844   PT Stop Time 0933   PT Time Calculation (min) 49 min   Activity Tolerance Patient tolerated treatment well   Behavior During Therapy [facility] for tasks assessed/performed      Past Medical History  Diagnosis Date  . RLS (restless legs syndrome)   . Esophageal stricture 2010  . Osteopenia   . Hyperlipemia   . Arthritis   . Kidney stones   . GERD (gastroesophageal reflux disease)   . Substance abuse     RECOVERING ALCOLHOLIC  . Alcoholism ([place])     HAS NOT HAD [place]  . Cataract   . 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 Hiatal hernia   . DDD (degenerative disc disease), lumbar   . Chronic back pain     Past Surgical History  Procedure Laterality Date  . Lumbar disc surgery  2008  . Facial cosmetic surgery      eyes, nose  . Abdominoplasty    . Liposuction  15 to 20 yrs ago  . Surgery for kidney stones    . Right index finger surgery  06/19/11    AT [facility]  . Laparoscopic nissen fundoplication  1/60/7371    Procedure: LAPAROSCOPIC NISSEN FUNDOPLICATION;  Surgeon: Pedro Earls, MD;  [facility];  Service: General;  Laterality: N/A;  Laparoscopic Nissen repair of hiatal hernia  with lighted bougie  . Esophageal manometry  02/17/2012    Procedure: ESOPHAGEAL MANOMETRY (EM);  Surgeon: Pedro Earls, MD;  [facility];  Service: Endoscopy;  Laterality: N/A;  . Dilation and curettage of uterus  many yrs ago    x 2  . Total knee arthroplasty Left 08/10/2012    Procedure: TOTAL KNEE  ARTHROPLASTY;  Surgeon: Mauri Pole, MD;  [facility];  Service: Orthopedics;  Laterality: Left;  . Joint replacement      left  . Open surgical repair of gluteal tendon Right 12/28/2012    Procedure: RIGHT GLUTEAL TENDON REPAIR;  Surgeon: Mauri Pole, MD;  [facility];  Service: Orthopedics;  Laterality: Right;    There were no vitals filed for this visit.  Visit Diagnosis:  Midline low back pain without sciatica  Difficulty walking  Foot drop, left  Weakness of both hips      Subjective Assessment - 04/11/15 0942    Subjective No real big issues, I just really want to walk better.   Currently in Pain? Yes   Pain Score 2    Pain Location Back                         [facility] Adult PT Treatment/Exercise - 04/11/15 0001    Lumbar Exercises: Aerobic   Elliptical NuStep Level 6 x 6 minutes   Tread Mill 1.8 MPH x 5 minutes with cues for longer steps and smoother gait   Knee/Hip Exercises: Stretches   Sports administrator 3 reps;30 seconds   Hip Flexor Stretch 3 reps;20  seconds   Piriformis Stretch 30 seconds;3 reps   Other Knee/Hip Stretches IR and adductor stretches   Knee/Hip Exercises: Machines for Strengthening   Cybex Leg Press 30# 2x10, 30# single leg 3x10 each   Knee/Hip Exercises: Supine   Bridges with Ball Squeeze 3 sets;10 reps   Other Supine Knee/Hip Exercises use of sliding board hip abduction, adduction, IR with cues verbal and tactile to decrease hip rotation or elevation   Other Supine Knee/Hip Exercises hip IR, hip extension with Tband   Knee/Hip Exercises: Sidelying   Hip ABduction 3 sets;10 reps   Hip ABduction Limitations some assist needed for the right hip used tband to assist   Clams 2x15 each side                  PT Short Term Goals - 02/16/15 1429    PT SHORT TERM GOAL #1   Title independent with initial HEP   Status Achieved           PT Long Term Goals - 04/11/15 0949    PT LONG TERM GOAL #2   Title  decrease pain 50%   Status Achieved               Plan - 04/11/15 0948    Clinical Impression Statement Patient walked much better after the stretches.   PT Next Visit Plan may have to focus on the flexibility   Consulted and Agree with Plan of Care Patient         Sumner Boast., PT 04/11/2015, 9:50 AM  [place] [place], [place], [address] Phone: [phone]   Fax:  [phone]  Name: Felicia Cruz MRN: [id number] Date of Birth: 07/19/1940

## 2015-04-13 ENCOUNTER — Ambulatory Visit: Payer: Medicare Other | Admitting: Physical Therapy

## 2015-04-13 ENCOUNTER — Encounter: Payer: Self-pay | Admitting: Physical Therapy

## 2015-04-13 DIAGNOSIS — M21372 Foot drop, left foot: Secondary | ICD-10-CM

## 2015-04-13 DIAGNOSIS — M545 Low back pain, unspecified: Secondary | ICD-10-CM

## 2015-04-13 DIAGNOSIS — R29898 Other symptoms and signs involving the musculoskeletal system: Secondary | ICD-10-CM

## 2015-04-13 DIAGNOSIS — R262 Difficulty in walking, not elsewhere classified: Secondary | ICD-10-CM | POA: Diagnosis not present

## 2015-04-13 DIAGNOSIS — M6289 Other specified disorders of muscle: Secondary | ICD-10-CM | POA: Diagnosis not present

## 2015-04-13 NOTE — Therapy (Signed)
[address] [place], [place], [address] Phone: [phone]   Fax:  [phone]  Physical Therapy Treatment  Patient Details  Name: Felicia Cruz MRN: [id number] Date of Birth: Oct 22, 1940 No Data Recorded  Encounter Date: 04/13/2015      PT End of Session - 04/13/15 3716    Visit Number 23   Date for PT Re-Evaluation 04/28/15   PT Start Time 0846   PT Stop Time 0927   PT Time Calculation (min) 41 min   Activity Tolerance Patient tolerated treatment well   Behavior During Therapy [facility] for tasks assessed/performed      Past Medical History  Diagnosis Date  . RLS (restless legs syndrome)   . Esophageal stricture 2010  . Osteopenia   . Hyperlipemia   . Arthritis   . Kidney stones   . GERD (gastroesophageal reflux disease)   . Substance abuse     RECOVERING ALCOLHOLIC  . Alcoholism ([place])     HAS NOT HAD [place]  . Cataract   . 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 Hiatal hernia   . DDD (degenerative disc disease), lumbar   . Chronic back pain     Past Surgical History  Procedure Laterality Date  . Lumbar disc surgery  2008  . Facial cosmetic surgery      eyes, nose  . Abdominoplasty    . Liposuction  15 to 20 yrs ago  . Surgery for kidney stones    . Right index finger surgery  06/19/11    AT [facility]  . Laparoscopic nissen fundoplication  9/67/8938    Procedure: LAPAROSCOPIC NISSEN FUNDOPLICATION;  Surgeon: Pedro Earls, MD;  [facility];  Service: General;  Laterality: N/A;  Laparoscopic Nissen repair of hiatal hernia  with lighted bougie  . Esophageal manometry  02/17/2012    Procedure: ESOPHAGEAL MANOMETRY (EM);  Surgeon: Pedro Earls, MD;  [facility];  Service: Endoscopy;  Laterality: N/A;  . Dilation and curettage of uterus  many yrs ago    x 2  . Total knee arthroplasty Left 08/10/2012    Procedure: TOTAL KNEE  ARTHROPLASTY;  Surgeon: Mauri Pole, MD;  [facility];  Service: Orthopedics;  Laterality: Left;  . Joint replacement      left  . Open surgical repair of gluteal tendon Right 12/28/2012    Procedure: RIGHT GLUTEAL TENDON REPAIR;  Surgeon: Mauri Pole, MD;  [facility];  Service: Orthopedics;  Laterality: Right;    There were no vitals filed for this visit.  Visit Diagnosis:  Midline low back pain without sciatica  Difficulty walking  Foot drop, left  Weakness of both hips      Subjective Assessment - 04/13/15 0904    Subjective Saw the MD yesterday, he was pleased and felt like I need to continue to work on strength, he thought the way I walked was better and that my drop foot was getting better.   Currently in Pain? Yes   Pain Score 2    Pain Location Back   Pain Orientation Lower   Aggravating Factors  standing   Pain Relieving Factors rest                         [facility] Adult PT Treatment/Exercise - 04/13/15 0001    Lumbar Exercises: Aerobic   Tread Mill 1.8  MPH x 5 minutes with cues for longer steps and smoother gait   Knee/Hip Exercises: Stretches   Sports administrator 3 reps;30 seconds   Hip Flexor Stretch 3 reps;20 seconds   Piriformis Stretch 30 seconds;3 reps   Other Knee/Hip Stretches IR and adductor stretches   Knee/Hip Exercises: Machines for Strengthening   Cybex Knee Extension 5# single leg 2x10 each   Cybex Knee Flexion 20# single leg 2x10 each   Cybex Leg Press 60# 2x10, single legs 20# 3x10 each   Hip Cybex seated rows and lats 25#    Other Machine standing 3# hip flexion, abduction and extension, did some standing with knee bent at 90 degrees working on IR of the hip, also with her sitting and tband around ankles working on IR of the hips   Knee/Hip Exercises: Supine   Other Supine Knee/Hip Exercises hip IR, hip extension with Tband   Knee/Hip Exercises: Sidelying   Hip ABduction 3 sets;10 reps   Hip ABduction Limitations 2# in  standing today   Other Sidelying Knee/Hip Exercises hip IR with ball b/n knees and 2# on ankles, sit fit ankle exercises                  PT Short Term Goals - 02/16/15 1429    PT SHORT TERM GOAL #1   Title independent with initial HEP   Status Achieved           PT Long Term Goals - 04/13/15 0929    PT LONG TERM GOAL #4   Title demonstrate active DF to 0 degrees   Status Partially Met               Plan - 04/13/15 0928    Clinical Impression Statement She needs cues to assure that she is taking big, long and smooth steps.  She is able to walk pretty good with a cane for shorter distances.   PT Next Visit Plan may have to focus on the flexibility   Consulted and Agree with Plan of Care Patient        Problem List Patient Active Problem List   Diagnosis Date Noted  . Postoperative anemia due to acute blood loss 12/30/2014  . Essential hypertension, benign 07/04/2014  . Accelerated hypertension 05/05/2014  . Wheezing 03/30/2014  . Pulmonary embolism ([place]) 10/14/2013  . Acute respiratory failure ([place]) 10/14/2013  . Chest pain 10/14/2013  . Anemia 10/14/2013  . PE (pulmonary embolism) 10/14/2013  . Urinary frequency 04/07/2013  . Urinary incontinence 04/07/2013  . Right gluteus tear 12/28/2012  . Generalized anxiety disorder 08/19/2012  . Unspecified constipation 08/19/2012  . GERD (gastroesophageal reflux disease) 08/19/2012  . Overweight (BMI 25.0-29.9) 08/11/2012  . Expected blood loss anemia 08/11/2012  . Knee pain, left 03/31/2012  . Depression 09/10/2011  . S/P Nissen fundoplication (without gastrostomy tube) procedure 07/26/2011  . Chronic cough 01/09/2011  . RESTLESS LEG SYNDROME 05/05/2008  . HYPERLIPIDEMIA 09/14/2007  . Osteopenia 09/14/2007    Sumner Boast., PT 04/13/2015, 9:30 AM  [place] [address] [place], [place], [address] Phone: [phone]   Fax:   [phone]  Name: VERANIA SALBERG MRN: [id number] Date of Birth: 1941/03/03

## 2015-04-17 ENCOUNTER — Encounter: Payer: Self-pay | Admitting: Physical Therapy

## 2015-04-17 ENCOUNTER — Ambulatory Visit: Payer: Medicare Other | Admitting: Physical Therapy

## 2015-04-17 DIAGNOSIS — M21372 Foot drop, left foot: Secondary | ICD-10-CM

## 2015-04-17 DIAGNOSIS — M6289 Other specified disorders of muscle: Secondary | ICD-10-CM | POA: Diagnosis not present

## 2015-04-17 DIAGNOSIS — M545 Low back pain, unspecified: Secondary | ICD-10-CM

## 2015-04-17 DIAGNOSIS — R262 Difficulty in walking, not elsewhere classified: Secondary | ICD-10-CM | POA: Diagnosis not present

## 2015-04-17 DIAGNOSIS — R29898 Other symptoms and signs involving the musculoskeletal system: Secondary | ICD-10-CM

## 2015-04-17 NOTE — Therapy (Signed)
[address] [place], [place], [address] Phone: [phone]   Fax:  [phone]  Physical Therapy Treatment  Patient Details  Name: Felicia Cruz MRN: [id number] Date of Birth: [birth year] No Data Recorded  Encounter Date: 04/17/2015      PT End of Session - 04/17/15 1134    Visit Number 24   Date for PT Re-Evaluation 04/28/15   PT Start Time 1017   PT Stop Time 1114   PT Time Calculation (min) 57 min   Activity Tolerance Patient tolerated treatment well   Behavior During Therapy [facility] for tasks assessed/performed      Past Medical History  Diagnosis Date  . RLS (restless legs syndrome)   . Esophageal stricture 2010  . Osteopenia   . Hyperlipemia   . Arthritis   . Kidney stones   . GERD (gastroesophageal reflux disease)   . Substance abuse     RECOVERING ALCOLHOLIC  . Alcoholism ([place])     HAS NOT HAD [place]  . Cataract   . 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 Hiatal hernia   . DDD (degenerative disc disease), lumbar   . Chronic back pain     Past Surgical History  Procedure Laterality Date  . Lumbar disc surgery  2008  . Facial cosmetic surgery      eyes, nose  . Abdominoplasty    . Liposuction  15 to 20 yrs ago  . Surgery for kidney stones    . Right index finger surgery  06/19/11    AT [facility]  . Laparoscopic nissen fundoplication  7/89/3810    Procedure: LAPAROSCOPIC NISSEN FUNDOPLICATION;  Surgeon: Pedro Earls, MD;  [facility];  Service: General;  Laterality: N/A;  Laparoscopic Nissen repair of hiatal hernia  with lighted bougie  . Esophageal manometry  02/17/2012    Procedure: ESOPHAGEAL MANOMETRY (EM);  Surgeon: Pedro Earls, MD;  [facility];  Service: Endoscopy;  Laterality: N/A;  . Dilation and curettage of uterus  many yrs ago    x 2  . Total knee arthroplasty Left 08/10/2012    Procedure: TOTAL KNEE  ARTHROPLASTY;  Surgeon: Mauri Pole, MD;  [facility];  Service: Orthopedics;  Laterality: Left;  . Joint replacement      left  . Open surgical repair of gluteal tendon Right 12/28/2012    Procedure: RIGHT GLUTEAL TENDON REPAIR;  Surgeon: Mauri Pole, MD;  [facility];  Service: Orthopedics;  Laterality: Right;    There were no vitals filed for this visit.  Visit Diagnosis:  Midline low back pain without sciatica  Difficulty walking  Foot drop, left  Weakness of both hips      Subjective Assessment - 04/17/15 1017    Subjective I picked up a heavy laundry basket yesterday and now I am really sore in the low back   Currently in Pain? Yes   Pain Score 5    Pain Location Back   Pain Orientation Lower   Pain Descriptors / Indicators Aching;Spasm   Pain Type Chronic pain   Aggravating Factors  lifting and standing   Pain Relieving Factors treatement                         [facility] Adult PT Treatment/Exercise - 04/17/15 0001    Lumbar Exercises: Aerobic   Elliptical NuStep Level 6  x 6 minutes   Tread Mill 1.8 MPH x 5 minutes with cues for longer steps and smoother gait   Knee/Hip Exercises: Stretches   Sports administrator 3 reps;30 seconds   Hip Flexor Stretch 3 reps;20 seconds   Piriformis Stretch 30 seconds;3 reps   Other Knee/Hip Stretches IR and adductor stretches   Knee/Hip Exercises: Machines for Strengthening   Cybex Knee Extension 5# single leg 2x10 each   Cybex Leg Press 60# 2x10, single legs 20# 3x10 each   Hip Cybex weighted ball overhead lift and then trunk rotation   Other Machine standing 3# hip flexion, abduction and extension, did some standing with knee bent at 90 degrees working on IR of the hip, also with her sitting and tband around ankles working on IR of the hips   Knee/Hip Exercises: Supine   Other Supine Knee/Hip Exercises hip IR, hip extension with Tband   Knee/Hip Exercises: Sidelying   Hip ADduction Limitations with ball    Clams 2x15 each side   Other Sidelying Knee/Hip Exercises hip IR with ball b/n knees and 2# on ankles, sit fit ankle exercises   Electrical Stimulation   Electrical Stimulation Location low back   Electrical Stimulation Action IFC   Electrical Stimulation Parameters tolerance   Electrical Stimulation Goals Pain                  PT Short Term Goals - 02/16/15 1429    PT SHORT TERM GOAL #1   Title independent with initial HEP   Status Achieved           PT Long Term Goals - 04/13/15 0929    PT LONG TERM GOAL #4   Title demonstrate active DF to 0 degrees   Status Partially Met               Plan - 04/17/15 1135    Clinical Impression Statement Having a little more lumbar pain today, having a little more problem wtih walking, patient correlates this to lifting a laundry basket that was heavier than she thought over the weekend and having some increased pain since that time.   PT Next Visit Plan work on strength and flexibility   Consulted and Agree with Plan of Care Patient        Problem List Patient Active Problem List   Diagnosis Date Noted  . Postoperative anemia due to acute blood loss 12/30/2014  . Essential hypertension, benign 07/04/2014  . Accelerated hypertension 05/05/2014  . Wheezing 03/30/2014  . Pulmonary embolism ([place]) 10/14/2013  . Acute respiratory failure ([place]) 10/14/2013  . Chest pain 10/14/2013  . Anemia 10/14/2013  . PE (pulmonary embolism) 10/14/2013  . Urinary frequency 04/07/2013  . Urinary incontinence 04/07/2013  . Right gluteus tear 12/28/2012  . Generalized anxiety disorder 08/19/2012  . Unspecified constipation 08/19/2012  . GERD (gastroesophageal reflux disease) 08/19/2012  . Overweight (BMI 25.0-29.9) 08/11/2012  . Expected blood loss anemia 08/11/2012  . Knee pain, left 03/31/2012  . Depression 09/10/2011  . S/P Nissen fundoplication (without gastrostomy tube) procedure 07/26/2011  . Chronic cough 01/09/2011  .  RESTLESS LEG SYNDROME 05/05/2008  . HYPERLIPIDEMIA 09/14/2007  . Osteopenia 09/14/2007    Sumner Boast ., PT  04/17/2015, 11:45 AM  [address] [place], [place], [address] Phone: [phone]   Fax:  [phone]  Name: NADALEE NEISWENDER MRN: [id number] Date of Birth: Mar 23, 1941

## 2015-04-19 ENCOUNTER — Ambulatory Visit: Payer: Medicare Other | Admitting: Physical Therapy

## 2015-04-19 ENCOUNTER — Encounter: Payer: Self-pay | Admitting: Physical Therapy

## 2015-04-19 DIAGNOSIS — R262 Difficulty in walking, not elsewhere classified: Secondary | ICD-10-CM | POA: Diagnosis not present

## 2015-04-19 DIAGNOSIS — M545 Low back pain, unspecified: Secondary | ICD-10-CM

## 2015-04-19 DIAGNOSIS — R29898 Other symptoms and signs involving the musculoskeletal system: Secondary | ICD-10-CM

## 2015-04-19 DIAGNOSIS — M6289 Other specified disorders of muscle: Secondary | ICD-10-CM | POA: Diagnosis not present

## 2015-04-19 DIAGNOSIS — M21372 Foot drop, left foot: Secondary | ICD-10-CM | POA: Diagnosis not present

## 2015-04-19 NOTE — Therapy (Signed)
[address] [place], [place], [address] Phone: [phone]   Fax:  [phone]  Physical Therapy Treatment  Patient Details  Name: Felicia Cruz MRN: [id number] Date of Birth: 03/14/1941 No Data Recorded  Encounter Date: 04/19/2015      PT End of Session - 04/19/15 1129    Visit Number 25   Date for PT Re-Evaluation 04/28/15   PT Start Time 1014   PT Stop Time 1115   PT Time Calculation (min) 61 min   Activity Tolerance Patient tolerated treatment well   Behavior During Therapy [facility] for tasks assessed/performed      Past Medical History  Diagnosis Date  . RLS (restless legs syndrome)   . Esophageal stricture 2010  . Osteopenia   . Hyperlipemia   . Arthritis   . Kidney stones   . GERD (gastroesophageal reflux disease)   . Substance abuse     RECOVERING ALCOLHOLIC  . Alcoholism ([place])     HAS NOT HAD [place]  . Cataract   . 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 Hiatal hernia   . DDD (degenerative disc disease), lumbar   . Chronic back pain     Past Surgical History  Procedure Laterality Date  . Lumbar disc surgery  2008  . Facial cosmetic surgery      eyes, nose  . Abdominoplasty    . Liposuction  15 to 20 yrs ago  . Surgery for kidney stones    . Right index finger surgery  06/19/11    AT [facility]  . Laparoscopic nissen fundoplication  [id number]    Procedure: LAPAROSCOPIC NISSEN FUNDOPLICATION;  Surgeon: Pedro Earls, MD;  [facility];  Service: General;  Laterality: N/A;  Laparoscopic Nissen repair of hiatal hernia  with lighted bougie  . Esophageal manometry  02/17/2012    Procedure: ESOPHAGEAL MANOMETRY (EM);  Surgeon: Pedro Earls, MD;  [facility];  Service: Endoscopy;  Laterality: N/A;  . Dilation and curettage of uterus  many yrs ago    x 2  . Total knee arthroplasty Left 08/10/2012    Procedure: TOTAL KNEE  ARTHROPLASTY;  Surgeon: Mauri Pole, MD;  [facility];  Service: Orthopedics;  Laterality: Left;  . Joint replacement      left  . Open surgical repair of gluteal tendon Right 12/28/2012    Procedure: RIGHT GLUTEAL TENDON REPAIR;  Surgeon: Mauri Pole, MD;  [facility];  Service: Orthopedics;  Laterality: Right;    There were no vitals filed for this visit.  Visit Diagnosis:  Midline low back pain without sciatica  Difficulty walking  Weakness of both hips      Subjective Assessment - 04/19/15 1126    Subjective I am a little better after last time, I think my leg is getting stronger.   Currently in Pain? Yes   Pain Score 3    Pain Location Back   Pain Orientation Lower   Pain Descriptors / Indicators Aching;Spasm   Pain Type Chronic pain            [facility] PT Assessment - 04/19/15 0001    Timed Up and Go Test   Normal TUG (seconds) 15                     [facility] Adult PT Treatment/Exercise - 04/19/15 0001    Lumbar Exercises: Aerobic  Elliptical NuStep Level 6 x 6 minutes   Tread Mill 1.8 MPH x 5 minutes with cues for longer steps and smoother gait   Knee/Hip Exercises: Stretches   Sports administrator 3 reps;30 seconds   Hip Flexor Stretch 3 reps;20 seconds   Piriformis Stretch 30 seconds;3 reps   Other Knee/Hip Stretches IR and adductor stretches   Knee/Hip Exercises: Machines for Strengthening   Cybex Leg Press 60# 2x10, single legs 20# 3x10 each   Total Gym Leg Press standing 30# right hip poush down on towers/pulleys   Knee/Hip Exercises: Supine   Other Supine Knee/Hip Exercises use of sliding board hip abduction, adduction, IR with cues verbal and tactile to decrease hip rotation or elevation   Other Supine Knee/Hip Exercises hip IR, hip extension with Tband   Knee/Hip Exercises: Sidelying   Other Sidelying Knee/Hip Exercises hip IR with ball b/n knees and 2# on ankles, sit fit ankle exercises   Electrical Stimulation   Electrical  Stimulation Location low back   Electrical Stimulation Action IFC   Electrical Stimulation Parameters tolerance   Electrical Stimulation Goals Pain                  PT Short Term Goals - 02/16/15 1429    PT SHORT TERM GOAL #1   Title independent with initial HEP   Status Achieved           PT Long Term Goals - 04/19/15 1140    PT LONG TERM GOAL #1   Title decrease Tug time to 15 seconds   Status Achieved               Plan - 04/19/15 1137    Clinical Impression Statement Patient did much better with the hip push down, the first time we did this her hip was all over with no control of rotation.  Today had bettter rotational control.   PT Next Visit Plan continue with strength   Consulted and Agree with Plan of Care Patient        Problem List Patient Active Problem List   Diagnosis Date Noted  . Postoperative anemia due to acute blood loss 12/30/2014  . Essential hypertension, benign 07/04/2014  . Accelerated hypertension 05/05/2014  . Wheezing 03/30/2014  . Pulmonary embolism ([place]) 10/14/2013  . Acute respiratory failure ([place]) 10/14/2013  . Chest pain 10/14/2013  . Anemia 10/14/2013  . PE (pulmonary embolism) 10/14/2013  . Urinary frequency 04/07/2013  . Urinary incontinence 04/07/2013  . Right gluteus tear 12/28/2012  . Generalized anxiety disorder 08/19/2012  . Unspecified constipation 08/19/2012  . GERD (gastroesophageal reflux disease) 08/19/2012  . Overweight (BMI 25.0-29.9) 08/11/2012  . Expected blood loss anemia 08/11/2012  . Knee pain, left 03/31/2012  . Depression 09/10/2011  . S/P Nissen fundoplication (without gastrostomy tube) procedure 07/26/2011  . Chronic cough 01/09/2011  . RESTLESS LEG SYNDROME 05/05/2008  . HYPERLIPIDEMIA 09/14/2007  . Osteopenia 09/14/2007    Sumner Boast., PT 04/19/2015, 11:42 AM  [address] [place], [place],  [address] Phone: [phone]   Fax:  [phone]  Name: Felicia Cruz MRN: [id number] Date of Birth: [birth year]

## 2015-05-02 ENCOUNTER — Ambulatory Visit: Payer: Medicare Other | Admitting: Physical Therapy

## 2015-05-02 ENCOUNTER — Encounter: Payer: Self-pay | Admitting: Physical Therapy

## 2015-05-02 DIAGNOSIS — M545 Low back pain, unspecified: Secondary | ICD-10-CM

## 2015-05-02 DIAGNOSIS — R29898 Other symptoms and signs involving the musculoskeletal system: Secondary | ICD-10-CM

## 2015-05-02 DIAGNOSIS — R262 Difficulty in walking, not elsewhere classified: Secondary | ICD-10-CM

## 2015-05-02 DIAGNOSIS — M21372 Foot drop, left foot: Secondary | ICD-10-CM | POA: Diagnosis not present

## 2015-05-02 DIAGNOSIS — M6289 Other specified disorders of muscle: Secondary | ICD-10-CM | POA: Diagnosis not present

## 2015-05-02 NOTE — Therapy (Signed)
[address] [place], [place], [address] Phone: [phone]   Fax:  [phone]  Physical Therapy Treatment  Patient Details  Name: Felicia Cruz MRN: [id number] Date of Birth: [birth year] No Data Recorded  Encounter Date: 05/02/2015      PT End of Session - 05/02/15 1059    Visit Number 26   Date for PT Re-Evaluation 05/28/15   PT Start Time 1916   PT Stop Time 1105   PT Time Calculation (min) 50 min   Activity Tolerance Patient tolerated treatment well   Behavior During Therapy [facility] for tasks assessed/performed      Past Medical History  Diagnosis Date  . RLS (restless legs syndrome)   . Esophageal stricture 2010  . Osteopenia   . Hyperlipemia   . Arthritis   . Kidney stones   . GERD (gastroesophageal reflux disease)   . Substance abuse     RECOVERING ALCOLHOLIC  . Alcoholism ([place])     HAS NOT HAD [place]  . Cataract   . 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 Hiatal hernia   . DDD (degenerative disc disease), lumbar   . Chronic back pain     Past Surgical History  Procedure Laterality Date  . Lumbar disc surgery  2008  . Facial cosmetic surgery      eyes, nose  . Abdominoplasty    . Liposuction  15 to 20 yrs ago  . Surgery for kidney stones    . Right index finger surgery  06/19/11    AT [facility]  . Laparoscopic nissen fundoplication  11/07/43    Procedure: LAPAROSCOPIC NISSEN FUNDOPLICATION;  Surgeon: Pedro Earls, MD;  [facility];  Service: General;  Laterality: N/A;  Laparoscopic Nissen repair of hiatal hernia  with lighted bougie  . Esophageal manometry  02/17/2012    Procedure: ESOPHAGEAL MANOMETRY (EM);  Surgeon: Pedro Earls, MD;  [facility];  Service: Endoscopy;  Laterality: N/A;  . Dilation and curettage of uterus  many yrs ago    x 2  . Total knee arthroplasty Left 08/10/2012    Procedure: TOTAL KNEE  ARTHROPLASTY;  Surgeon: Mauri Pole, MD;  [facility];  Service: Orthopedics;  Laterality: Left;  . Joint replacement      left  . Open surgical repair of gluteal tendon Right 12/28/2012    Procedure: RIGHT GLUTEAL TENDON REPAIR;  Surgeon: Mauri Pole, MD;  [facility];  Service: Orthopedics;  Laterality: Right;    There were no vitals filed for this visit.  Visit Diagnosis:  Midline low back pain without sciatica - Plan: PT plan of care cert/re-cert  Difficulty walking - Plan: PT plan of care cert/re-cert  Weakness of both hips - Plan: PT plan of care cert/re-cert      Subjective Assessment - 05/02/15 1015    Subjective I had a fall about 10 days ago, my left toes went into spasm as I was walking and I tripped, landed on my knees.  Denies pain or any bruising.  May have a little more back pain.   Currently in Pain? Yes   Pain Score 4    Pain Location Back   Pain Orientation Lower   Pain Descriptors / Indicators Aching   Pain Type Chronic pain   Pain Onset More than a month ago   Pain Frequency Constant   Aggravating Factors  that fall may have increased back pain   Pain Relieving Factors the treatment I get here does help the back                         [facility] Adult PT Treatment/Exercise - 05/02/15 0001    Lumbar Exercises: Aerobic   Elliptical NuStep Level 6 x 6 minutes   Tread Mill 1.8 MPH x 5 minutes with cues for longer steps and smoother gait   Knee/Hip Exercises: Stretches   Sports administrator 3 reps;30 seconds   Hip Flexor Stretch 3 reps;20 seconds   Piriformis Stretch 30 seconds;3 reps   Other Knee/Hip Stretches IR and adductor stretches   Knee/Hip Exercises: Machines for Strengthening   Cybex Leg Press 40# 2x10, single legs 20#   Total Gym Leg Press standing 50# right hip poush down on towers/pulleys   Knee/Hip Exercises: Supine   Bridges 3 sets;10 reps   Bridges Limitations feet on ball   Other Supine Knee/Hip Exercises use of sliding  board hip abduction, adduction, IR with cues verbal and tactile to decrease hip rotation or elevation   Other Supine Knee/Hip Exercises hip IR, hip extension with Tband   Knee/Hip Exercises: Sidelying   Clams 2x15 each side   Other Sidelying Knee/Hip Exercises hip IR with ball b/n knees and 2# on ankles, sit fit ankle exercises   Manual Therapy   Manual therapy comments PROM HS, piriformis and ITB bilaterally                  PT Short Term Goals - 02/16/15 1429    PT SHORT TERM GOAL #1   Title independent with initial HEP   Status Achieved           PT Long Term Goals - 05/02/15 1101    PT LONG TERM GOAL #1   Title decrease Tug time to 15 seconds   Status Achieved   PT LONG TERM GOAL #2   Title decrease pain 50%   Status Achieved   PT LONG TERM GOAL #3   Title walk with a SPC x 200 feet   Status Achieved   PT LONG TERM GOAL #4   Title demonstrate active DF to 0 degrees   Status Partially Met               Plan - 05/02/15 1100    Clinical Impression Statement She had a fall and is having some increase of back pain.  She is demonstrating better strength and control of the leg but is still very weak and has great difficulty walking   PT Next Visit Plan continue with strength   Consulted and Agree with Plan of Care Patient        Problem List Patient Active Problem List   Diagnosis Date Noted  . Postoperative anemia due to acute blood loss 12/30/2014  . Essential hypertension, benign 07/04/2014  . Accelerated hypertension 05/05/2014  . Wheezing 03/30/2014  . Pulmonary embolism ([place]) 10/14/2013  . Acute respiratory failure ([place]) 10/14/2013  . Chest pain 10/14/2013  . Anemia 10/14/2013  . PE (pulmonary embolism) 10/14/2013  . Urinary frequency 04/07/2013  . Urinary incontinence 04/07/2013  . Right gluteus tear 12/28/2012  . Generalized anxiety disorder 08/19/2012  . Unspecified constipation 08/19/2012  . GERD (gastroesophageal reflux disease)  08/19/2012  . Overweight (BMI 25.0-29.9) 08/11/2012  . Expected blood loss anemia 08/11/2012  . Knee pain, left 03/31/2012  . Depression 09/10/2011  .  S/P Nissen fundoplication (without gastrostomy tube) procedure 07/26/2011  . Chronic cough 01/09/2011  . RESTLESS LEG SYNDROME 05/05/2008  . HYPERLIPIDEMIA 09/14/2007  . Osteopenia 09/14/2007    Sumner Boast., PT 05/02/2015, 11:06 AM  [address] [place], [place], [address] Phone: [phone]   Fax:  [phone]  Name: Felicia Cruz MRN: [id number] Date of Birth: 06-07-1940

## 2015-05-04 ENCOUNTER — Encounter: Payer: Self-pay | Admitting: Physical Therapy

## 2015-05-04 ENCOUNTER — Ambulatory Visit: Payer: Medicare Other | Attending: Neurological Surgery | Admitting: Physical Therapy

## 2015-05-04 DIAGNOSIS — R29898 Other symptoms and signs involving the musculoskeletal system: Secondary | ICD-10-CM

## 2015-05-04 DIAGNOSIS — M21372 Foot drop, left foot: Secondary | ICD-10-CM

## 2015-05-04 DIAGNOSIS — M6289 Other specified disorders of muscle: Secondary | ICD-10-CM | POA: Insufficient documentation

## 2015-05-04 DIAGNOSIS — M545 Low back pain, unspecified: Secondary | ICD-10-CM

## 2015-05-04 DIAGNOSIS — R262 Difficulty in walking, not elsewhere classified: Secondary | ICD-10-CM

## 2015-05-04 NOTE — Therapy (Signed)
[place] [place], [place], [address] Phone: [phone]   Fax:  [phone]  Physical Therapy Treatment  Patient Details  Name: Felicia Cruz MRN: [id number] Date of Birth: 28-Mar-1941 No Data Recorded  Encounter Date: 05/04/2015      PT End of Session - 05/04/15 1120    Visit Number 27   Date for PT Re-Evaluation 05/28/15   PT Start Time 1029   PT Stop Time 1130   PT Time Calculation (min) 61 min   Activity Tolerance Patient tolerated treatment well   Behavior During Therapy [facility] for tasks assessed/performed      Past Medical History  Diagnosis Date  . RLS (restless legs syndrome)   . Esophageal stricture 2010  . Osteopenia   . Hyperlipemia   . Arthritis   . Kidney stones   . GERD (gastroesophageal reflux disease)   . Substance abuse     RECOVERING ALCOLHOLIC  . Alcoholism ([place])     HAS NOT HAD [place]  . Cataract   . 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 Hiatal hernia   . DDD (degenerative disc disease), lumbar   . Chronic back pain     Past Surgical History  Procedure Laterality Date  . Lumbar disc surgery  2008  . Facial cosmetic surgery      eyes, nose  . Abdominoplasty    . Liposuction  15 to 20 yrs ago  . Surgery for kidney stones    . Right index finger surgery  06/19/11    AT [facility]  . Laparoscopic nissen fundoplication  06/05/129    Procedure: LAPAROSCOPIC NISSEN FUNDOPLICATION;  Surgeon: Pedro Earls, MD;  [facility];  Service: General;  Laterality: N/A;  Laparoscopic Nissen repair of hiatal hernia  with lighted bougie  . Esophageal manometry  02/17/2012    Procedure: ESOPHAGEAL MANOMETRY (EM);  Surgeon: Pedro Earls, MD;  [facility];  Service: Endoscopy;  Laterality: N/A;  . Dilation and curettage of uterus  many yrs ago    x 2  . Total knee arthroplasty Left 08/10/2012    Procedure: TOTAL KNEE  ARTHROPLASTY;  Surgeon: Mauri Pole, MD;  [facility];  Service: Orthopedics;  Laterality: Left;  . Joint replacement      left  . Open surgical repair of gluteal tendon Right 12/28/2012    Procedure: RIGHT GLUTEAL TENDON REPAIR;  Surgeon: Mauri Pole, MD;  [facility];  Service: Orthopedics;  Laterality: Right;    There were no vitals filed for this visit.  Visit Diagnosis:  Midline low back pain without sciatica  Difficulty walking  Weakness of both hips  Foot drop, left      Subjective Assessment - 05/04/15 1039    Subjective My left hip and leg are very sore today from the last visit.  Seems like I had to work harder last time, maybe due to me being gone for about 12 days.   Currently in Pain? Yes   Pain Score 4    Pain Location Back   Pain Orientation Lower;Left   Aggravating Factors  The fall I had and working harder   Pain Relieving Factors heat and stretching                         [facility] Adult PT Treatment/Exercise - 05/04/15 0001    Lumbar  Exercises: Aerobic   Elliptical NuStep Level 6 x 6 minutes   Tread Mill 1.4 mph working on holding on with one hand and needing cues for steps and body position   Knee/Hip Exercises: Public affairs consultant 3 reps;30 seconds   Hip Flexor Stretch 3 reps;20 seconds   Piriformis Stretch 30 seconds;3 reps   Other Knee/Hip Stretches IR and adductor stretches   Knee/Hip Exercises: Machines for Strengthening   Hip Cybex 8" step ups   Other Machine seated row 25#, lats 25#, tricep 20#   Knee/Hip Exercises: Supine   Other Supine Knee/Hip Exercises use of sliding board hip abduction, adduction, IR with cues verbal and tactile to decrease hip rotation or elevation   Other Supine Knee/Hip Exercises hip IR, hip extension with Tband   Acupuncturist Location low back   Electrical Stimulation Action IFC  C   Electrical Stimulation Parameters tolerance   Electrical  Stimulation Goals Pain   Ankle Exercises: Seated   Other Seated Ankle Exercises sit fit ankle motions with assist, yellow tband ankle exercises with a lot of cues                  PT Short Term Goals - 02/16/15 1429    PT SHORT TERM GOAL #1   Title independent with initial HEP   Status Achieved           PT Long Term Goals - 05/04/15 1123    PT LONG TERM GOAL #4   Title demonstrate active DF to 0 degrees   Status Partially Met               Plan - 05/04/15 1121    Clinical Impression Statement Patient with better right hip control, able to abduct the right hip with toes pointing up, whereas she used to externally rotate the hip and also flex the hip some.   PT Next Visit Plan work on strength and the flexibility to gain increased function, currently her gait could lead to back and hip issues   Consulted and Agree with Plan of Care Patient        Problem List Patient Active Problem List   Diagnosis Date Noted  . Postoperative anemia due to acute blood loss 12/30/2014  . Essential hypertension, benign 07/04/2014  . Accelerated hypertension 05/05/2014  . Wheezing 03/30/2014  . Pulmonary embolism ([place]) 10/14/2013  . Acute respiratory failure ([place]) 10/14/2013  . Chest pain 10/14/2013  . Anemia 10/14/2013  . PE (pulmonary embolism) 10/14/2013  . Urinary frequency 04/07/2013  . Urinary incontinence 04/07/2013  . Right gluteus tear 12/28/2012  . Generalized anxiety disorder 08/19/2012  . Unspecified constipation 08/19/2012  . GERD (gastroesophageal reflux disease) 08/19/2012  . Overweight (BMI 25.0-29.9) 08/11/2012  . Expected blood loss anemia 08/11/2012  . Knee pain, left 03/31/2012  . Depression 09/10/2011  . S/P Nissen fundoplication (without gastrostomy tube) procedure 07/26/2011  . Chronic cough 01/09/2011  . RESTLESS LEG SYNDROME 05/05/2008  . HYPERLIPIDEMIA 09/14/2007  . Osteopenia 09/14/2007    Sumner Boast., PT 05/04/2015, 11:30  AM  [place] [address] [place], [place], [address] Phone: [phone]   Fax:  [phone]  Name: Felicia Cruz MRN: [id number] Date of Birth: 1941/05/29

## 2015-05-08 DIAGNOSIS — Z7982 Long term (current) use of aspirin: Secondary | ICD-10-CM | POA: Diagnosis not present

## 2015-05-08 DIAGNOSIS — I722 Aneurysm of renal artery: Secondary | ICD-10-CM | POA: Insufficient documentation

## 2015-05-08 DIAGNOSIS — I2510 Atherosclerotic heart disease of native coronary artery without angina pectoris: Secondary | ICD-10-CM | POA: Diagnosis not present

## 2015-05-08 DIAGNOSIS — Z7901 Long term (current) use of anticoagulants: Secondary | ICD-10-CM | POA: Diagnosis not present

## 2015-05-08 DIAGNOSIS — Z86711 Personal history of pulmonary embolism: Secondary | ICD-10-CM | POA: Diagnosis not present

## 2015-05-08 DIAGNOSIS — Z9889 Other specified postprocedural states: Secondary | ICD-10-CM | POA: Diagnosis not present

## 2015-05-09 ENCOUNTER — Ambulatory Visit: Payer: Medicare Other | Admitting: Physical Therapy

## 2015-05-11 ENCOUNTER — Ambulatory Visit: Payer: Medicare Other | Admitting: Physical Therapy

## 2015-05-15 ENCOUNTER — Ambulatory Visit: Payer: Medicare Other | Admitting: Physical Therapy

## 2015-05-16 ENCOUNTER — Ambulatory Visit: Payer: Medicare Other | Admitting: Family Medicine

## 2015-05-17 ENCOUNTER — Encounter: Payer: Self-pay | Admitting: Physical Therapy

## 2015-05-17 ENCOUNTER — Ambulatory Visit: Payer: Medicare Other | Admitting: Physical Therapy

## 2015-05-17 DIAGNOSIS — M21372 Foot drop, left foot: Secondary | ICD-10-CM

## 2015-05-17 DIAGNOSIS — M545 Low back pain, unspecified: Secondary | ICD-10-CM

## 2015-05-17 DIAGNOSIS — M6289 Other specified disorders of muscle: Secondary | ICD-10-CM | POA: Diagnosis not present

## 2015-05-17 DIAGNOSIS — R262 Difficulty in walking, not elsewhere classified: Secondary | ICD-10-CM

## 2015-05-17 DIAGNOSIS — R29898 Other symptoms and signs involving the musculoskeletal system: Secondary | ICD-10-CM

## 2015-05-17 NOTE — Therapy (Signed)
[address] [place], [place], [address] Phone: [phone]   Fax:  [phone]  Physical Therapy Treatment  Patient Details  Name: Felicia Cruz MRN: [id number] Date of Birth: [birth year] No Data Recorded  Encounter Date: 05/17/2015      PT End of Session - 05/17/15 1520    Visit Number 28   Date for PT Re-Evaluation 05/28/15   PT Start Time 1444   PT Stop Time 1535   PT Time Calculation (min) 51 min   Activity Tolerance Patient tolerated treatment well;Patient limited by fatigue   Behavior During Therapy [facility] for tasks assessed/performed      Past Medical History  Diagnosis Date  . RLS (restless legs syndrome)   . Esophageal stricture 2010  . Osteopenia   . Hyperlipemia   . Arthritis   . Kidney stones   . GERD (gastroesophageal reflux disease)   . Substance abuse     RECOVERING ALCOLHOLIC  . Alcoholism ([place])     HAS NOT HAD [place]  . Cataract   . 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 Hiatal hernia   . DDD (degenerative disc disease), lumbar   . Chronic back pain     Past Surgical History  Procedure Laterality Date  . Lumbar disc surgery  2008  . Facial cosmetic surgery      eyes, nose  . Abdominoplasty    . Liposuction  15 to 20 yrs ago  . Surgery for kidney stones    . Right index finger surgery  06/19/11    AT [facility]  . Laparoscopic nissen fundoplication  1/79/1505    Procedure: LAPAROSCOPIC NISSEN FUNDOPLICATION;  Surgeon: Pedro Earls, MD;  Location: [facility];  Service: General;  Laterality: N/A;  Laparoscopic Nissen repair of hiatal hernia  with lighted bougie  . Esophageal manometry  02/17/2012    Procedure: ESOPHAGEAL MANOMETRY (EM);  Surgeon: Pedro Earls, MD;  [facility]: [facility];  Service: Endoscopy;  Laterality: N/A;  . Dilation and curettage of uterus  many yrs ago    x 2  . 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 Joint replacement      left  . Open surgical repair of gluteal tendon Right 12/28/2012    Procedure: RIGHT GLUTEAL TENDON REPAIR;  Surgeon: Mauri Pole, MD;  [facility]: [facility];  Service: Orthopedics;  Laterality: Right;    There were no vitals filed for this visit.  Visit Diagnosis:  Midline low back pain without sciatica  Difficulty walking  Weakness of both hips  Foot drop, left      Subjective Assessment - 05/17/15 1446    Subjective Patient reports that she sold her house and moved out last week, she has been doing a lot more and has been active and stressed, she reports that she is very fatigued and is having increased pain in the hip and in the back.   Currently in Pain? Yes   Pain Score 6    Pain Location Back   Pain Orientation Left;Lower   Pain Descriptors / Indicators Aching;Tightness   Pain Type Chronic pain   Aggravating Factors  just all the stress   Pain Relieving Factors when you stretch me  [facility] Adult PT Treatment/Exercise - 2015/06/07 0001    Lumbar Exercises: Aerobic   Elliptical NuStep Level 6 x 6 minutes   Knee/Hip Exercises: Stretches   Sports administrator 3 reps;30 seconds   Hip Flexor Stretch 3 reps;20 seconds   Piriformis Stretch 30 seconds;3 reps   Other Knee/Hip Stretches IR and adductor stretches   Knee/Hip Exercises: Machines for Strengthening   Cybex Knee Extension 5# single leg 2x10 each   Cybex Knee Flexion 20# single leg 2x10 each   Hip Cybex 8" step ups   Knee/Hip Exercises: Supine   Bridges 3 sets;10 reps   Bridges Limitations feet on ball   Bridges with Ball Squeeze 3 sets;10 reps   Other Supine Knee/Hip Exercises use of sliding board hip abduction, adduction, IR with cues verbal and tactile to decrease hip rotation or elevation   Electrical Stimulation   Electrical  Stimulation Location low back   Electrical Stimulation Action IFC   Electrical Stimulation Parameters tolerance   Electrical Stimulation Goals Pain   Manual Therapy   Manual therapy comments PROM HS, piriformis and ITB bilaterally                  PT Short Term Goals - 02/16/15 1429    PT SHORT TERM GOAL #1   Title independent with initial HEP   Status Achieved           PT Long Term Goals - June 07, 2015 1525    PT LONG TERM GOAL #1   Title decrease Tug time to 15 seconds   Status Achieved   PT LONG TERM GOAL #2   Title decrease pain 50%   Status Achieved   PT LONG TERM GOAL #3   Title walk with a SPC x 200 feet   Status Achieved   PT LONG TERM GOAL #4   Title demonstrate active DF to 0 degrees   Status Achieved               Plan - 2015-06-07 1521    Clinical Impression Statement Patient overall has improved with ROM, strength, gait, balance and function.  She continues to have a poor gait with trendelenberg and some foot drop.  She is now using only a SPC for all distances of gait, able to drive, able to celan her house.  Her limitations remain with hip weakness ankle weakness and with the poor gait.  Will we d/c at this time as we are past the cap limit, I do feel that she could continue to improve but as she has moved and will ahve to sell her home we will d/c as she will be very busy, I feel that we will continue in the future and will make progress toward further independence dealing with strength.   PT Next Visit Plan Will d/c as noted above   Consulted and Agree with Plan of Care Patient          G-Codes - 06-07-2015 1528    Functional Assessment Tool Used foto   Functional Limitation Mobility: Walking and moving around   Mobility: Walking and Moving Around Current Status [phone]) At least 40 percent but less than 60 percent impaired, limited or restricted   Mobility: Walking and Moving Around Goal Status [phone]) At least 40 percent but less than 60  percent impaired, limited or restricted   Mobility: Walking and Moving Around Discharge Status [phone]) At least 40 percent but less than 60 percent impaired, limited or restricted  Problem List Patient Active Problem List   Diagnosis Date Noted  . Postoperative anemia due to acute blood loss 12/30/2014  . Essential hypertension, benign 07/04/2014  . Accelerated hypertension 05/05/2014  . Wheezing 03/30/2014  . Pulmonary embolism ([place]) 10/14/2013  . Acute respiratory failure ([place]) 10/14/2013  . Chest pain 10/14/2013  . Anemia 10/14/2013  . PE (pulmonary embolism) 10/14/2013  . Urinary frequency 04/07/2013  . Urinary incontinence 04/07/2013  . Right gluteus tear 12/28/2012  . Generalized anxiety disorder 08/19/2012  . Unspecified constipation 08/19/2012  . GERD (gastroesophageal reflux disease) 08/19/2012  . Overweight (BMI 25.0-29.9) 08/11/2012  . Expected blood loss anemia 08/11/2012  . Knee pain, left 03/31/2012  . Depression 09/10/2011  . S/P Nissen fundoplication (without gastrostomy tube) procedure 07/26/2011  . Chronic cough 01/09/2011  . RESTLESS LEG SYNDROME 05/05/2008  . HYPERLIPIDEMIA 09/14/2007  . Osteopenia 09/14/2007  PHYSICAL THERAPY DISCHARGE SUMMARY   Plan: Patient agrees to discharge.  Patient goals were met. Patient is being discharged due to meeting the stated rehab goals.  ?????   FOTO points of change increased 33 points    Ulanda Tackett W., PT 05/17/2015, 3:35 PM  [address] [place], [place], [address] Phone: [phone]   Fax:  [phone]  Name: NICHOLLE FALZON MRN: [id number] Date of Birth: 1941-03-15

## 2015-06-08 DIAGNOSIS — M79605 Pain in left leg: Secondary | ICD-10-CM | POA: Diagnosis not present

## 2015-06-08 DIAGNOSIS — M8588 Other specified disorders of bone density and structure, other site: Secondary | ICD-10-CM | POA: Diagnosis not present

## 2015-06-08 DIAGNOSIS — R269 Unspecified abnormalities of gait and mobility: Secondary | ICD-10-CM | POA: Diagnosis not present

## 2015-06-08 DIAGNOSIS — I10 Essential (primary) hypertension: Secondary | ICD-10-CM | POA: Diagnosis not present

## 2015-06-08 DIAGNOSIS — Z9889 Other specified postprocedural states: Secondary | ICD-10-CM | POA: Diagnosis not present

## 2015-06-08 DIAGNOSIS — Z880 Allergy status to penicillin: Secondary | ICD-10-CM | POA: Diagnosis not present

## 2015-06-08 DIAGNOSIS — Z7901 Long term (current) use of anticoagulants: Secondary | ICD-10-CM | POA: Diagnosis not present

## 2015-06-08 DIAGNOSIS — Z87891 Personal history of nicotine dependence: Secondary | ICD-10-CM | POA: Diagnosis not present

## 2015-06-08 DIAGNOSIS — Z86711 Personal history of pulmonary embolism: Secondary | ICD-10-CM | POA: Diagnosis not present

## 2015-06-08 DIAGNOSIS — M4316 Spondylolisthesis, lumbar region: Secondary | ICD-10-CM | POA: Diagnosis not present

## 2015-06-08 DIAGNOSIS — M25812 Other specified joint disorders, left shoulder: Secondary | ICD-10-CM | POA: Diagnosis not present

## 2015-06-08 DIAGNOSIS — M25852 Other specified joint disorders, left hip: Secondary | ICD-10-CM | POA: Diagnosis not present

## 2015-06-08 DIAGNOSIS — M25811 Other specified joint disorders, right shoulder: Secondary | ICD-10-CM | POA: Diagnosis not present

## 2015-06-08 DIAGNOSIS — Z79899 Other long term (current) drug therapy: Secondary | ICD-10-CM | POA: Diagnosis not present

## 2015-06-08 DIAGNOSIS — M47815 Spondylosis without myelopathy or radiculopathy, thoracolumbar region: Secondary | ICD-10-CM | POA: Diagnosis not present

## 2015-06-08 DIAGNOSIS — Z4789 Encounter for other orthopedic aftercare: Secondary | ICD-10-CM | POA: Diagnosis not present

## 2015-06-08 DIAGNOSIS — M545 Low back pain: Secondary | ICD-10-CM | POA: Diagnosis not present

## 2015-06-08 DIAGNOSIS — J449 Chronic obstructive pulmonary disease, unspecified: Secondary | ICD-10-CM | POA: Diagnosis not present

## 2015-06-08 DIAGNOSIS — M79604 Pain in right leg: Secondary | ICD-10-CM | POA: Diagnosis not present

## 2015-06-08 DIAGNOSIS — Z888 Allergy status to other drugs, medicaments and biological substances status: Secondary | ICD-10-CM | POA: Diagnosis not present

## 2015-06-08 DIAGNOSIS — Z981 Arthrodesis status: Secondary | ICD-10-CM | POA: Diagnosis not present

## 2015-06-08 DIAGNOSIS — M25851 Other specified joint disorders, right hip: Secondary | ICD-10-CM | POA: Diagnosis not present

## 2015-06-20 ENCOUNTER — Ambulatory Visit: Payer: Medicare Other | Admitting: Family Medicine

## 2015-06-21 ENCOUNTER — Encounter: Payer: Self-pay | Admitting: Family Medicine

## 2015-06-21 ENCOUNTER — Other Ambulatory Visit: Payer: Self-pay | Admitting: Family Medicine

## 2015-06-21 ENCOUNTER — Ambulatory Visit (INDEPENDENT_AMBULATORY_CARE_PROVIDER_SITE_OTHER): Payer: Medicare Other | Admitting: Family Medicine

## 2015-06-21 VITALS — BP 110/70

## 2015-06-21 DIAGNOSIS — I10 Essential (primary) hypertension: Secondary | ICD-10-CM | POA: Diagnosis not present

## 2015-06-21 MED ORDER — TRAMADOL HCL 50 MG PO TABS: ORAL_TABLET | ORAL | Status: DC

## 2015-06-21 NOTE — Progress Notes (Signed)
   Subjective:    Patient ID: Felicia Cruz, female    DOB: November 04, 1940, 75 y.o.   MRN: [id number]  HPI  Felicia Cruz is a 75 year old  Widowed female who comes in today for evaluation of a couple issues  [place]. She did not think anything was broken therefore not seen anybody. She's here today for evaluation of her knees. She's had a left total knee replacement.  She's had back surgery and hip surgery and now she feels like her hips her bothering her a lot and she's back to where she was before with her gait. She's due to go back to her hip surgeon ASAP.   She takes her Ativan in the morning because it causes insomnia. She's takes the Celexa full tablet at bedtime  Also in the morning she's noticed some lightheadedness. BP is 110/70 on Hyzaar 100-12 0.5 daily.    Review of Systems  review of systems otherwise negative    Objective:   Physical Exam  well-developed well-nourished female no acute distress vital signs stable she's afebrile examination of the legs show some bruising of the left knee with no bony tenderness. Full range of motion. Right knee shows no bruising full range of motion no tenderness       Assessment & Plan:   contusion right and left knees..........Marland Kitchen Reassured I think this will resolve spontaneously   bilateral hip pain and back pain.......Marland Kitchen Referred back to surgeon  Hypertension........... BP too low............ Cut the medication half............ BP check every morning.......... Follow-up in 4-6 weeks

## 2015-06-21 NOTE — Patient Instructions (Signed)
Your blood pressure medicine Hyzaar..........Marland Kitchen Do not take any tomorrow...........Marland Kitchen Restart on Friday morning by taken a half a tablet a day   Check your blood pressure daily in the morning   Return in 4-6 weeks for follow-up.......... Bring a record of all your blood pressure readings and the device when you return    tramadol 50 mg............Marland Kitchen 1/2-1 tablet twice daily as needed for severe pain

## 2015-06-27 ENCOUNTER — Ambulatory Visit: Payer: Medicare Other | Admitting: Family Medicine

## 2015-06-27 DIAGNOSIS — Z1231 Encounter for screening mammogram for malignant neoplasm of breast: Secondary | ICD-10-CM | POA: Diagnosis not present

## 2015-06-27 LAB — HM MAMMOGRAPHY

## 2015-06-29 ENCOUNTER — Encounter: Payer: Self-pay | Admitting: Family Medicine

## 2015-06-29 DIAGNOSIS — M25551 Pain in right hip: Secondary | ICD-10-CM | POA: Diagnosis not present

## 2015-06-29 DIAGNOSIS — M76891 Other specified enthesopathies of right lower limb, excluding foot: Secondary | ICD-10-CM | POA: Diagnosis not present

## 2015-07-11 ENCOUNTER — Ambulatory Visit: Payer: Medicare Other | Attending: Neurological Surgery | Admitting: Physical Therapy

## 2015-07-11 ENCOUNTER — Encounter: Payer: Self-pay | Admitting: Physical Therapy

## 2015-07-11 DIAGNOSIS — M545 Low back pain, unspecified: Secondary | ICD-10-CM

## 2015-07-11 DIAGNOSIS — M6289 Other specified disorders of muscle: Secondary | ICD-10-CM | POA: Insufficient documentation

## 2015-07-11 DIAGNOSIS — M21372 Foot drop, left foot: Secondary | ICD-10-CM | POA: Insufficient documentation

## 2015-07-11 DIAGNOSIS — R29898 Other symptoms and signs involving the musculoskeletal system: Secondary | ICD-10-CM

## 2015-07-11 DIAGNOSIS — R262 Difficulty in walking, not elsewhere classified: Secondary | ICD-10-CM | POA: Insufficient documentation

## 2015-07-11 NOTE — Therapy (Signed)
[address] [place], [place], [address] Phone: [phone]   Fax:  [phone]  Physical Therapy Evaluation  Patient Details  Name: Felicia Cruz MRN: [id number] Date of Birth: [birth year] Referring Provider: Harold Hedge  Encounter Date: 07/11/2015      PT End of Session - 07/11/15 1122    Visit Number 1   Date for PT Re-Evaluation 09/08/15   PT Start Time 1039   PT Stop Time 1135   PT Time Calculation (min) 56 min   Activity Tolerance Patient tolerated treatment well   Behavior During Therapy [facility] for tasks assessed/performed      Past Medical History  Diagnosis Date  . RLS (restless legs syndrome)   . Esophageal stricture 2010  . Osteopenia   . Hyperlipemia   . Arthritis   . Kidney stones   . GERD (gastroesophageal reflux disease)   . Substance abuse     RECOVERING ALCOLHOLIC  . Alcoholism ([place])     HAS NOT HAD [place]  . Cataract   . 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 Hiatal hernia   . DDD (degenerative disc disease), lumbar   . Chronic back pain     Past Surgical History  Procedure Laterality Date  . Lumbar disc surgery  2008  . Facial cosmetic surgery      eyes, nose  . Abdominoplasty    . Liposuction  15 to 20 yrs ago  . Surgery for kidney stones    . Right index finger surgery  06/19/11    AT [facility]  . Laparoscopic nissen fundoplication  [id number]    Procedure: LAPAROSCOPIC NISSEN FUNDOPLICATION;  Surgeon: Pedro Earls, MD;  [facility];  Service: General;  Laterality: N/A;  Laparoscopic Nissen repair of hiatal hernia  with lighted bougie  . Esophageal manometry  02/17/2012    Procedure: ESOPHAGEAL MANOMETRY (EM);  Surgeon: Pedro Earls, MD;  [facility];  Service: Endoscopy;  Laterality: N/A;  . Dilation and curettage of uterus  many yrs ago    x 2  . Total knee arthroplasty Left 08/10/2012    Procedure:  TOTAL KNEE ARTHROPLASTY;  Surgeon: Mauri Pole, MD;  [facility];  Service: Orthopedics;  Laterality: Left;  . Joint replacement      left  . Open surgical repair of gluteal tendon Right 12/28/2012    Procedure: RIGHT GLUTEAL TENDON REPAIR;  Surgeon: Mauri Pole, MD;  [facility];  Service: Orthopedics;  Laterality: Right;    There were no vitals filed for this visit.  Visit Diagnosis:  Midline low back pain without sciatica - Plan: PT plan of care cert/re-cert  Difficulty walking - Plan: PT plan of care cert/re-cert  Weakness of both hips - Plan: PT plan of care cert/re-cert  Foot drop, left - Plan: PT plan of care cert/re-cert      Subjective Assessment - 07/11/15 1043    Subjective Patient has been seen here in the past for right hip weakness (surgery) and back pain (surgery).  She had a fall at Christmas landing on hardwood floors.  She reports that since the fall she has been having increased hip and back pain with increased difficulty walking, She saw her orthopod that did the knee and the orthopod that did the hip replacements and they felt every thing was okay there.  She also saw the surgeon that did  the back surgery and they felt like everything was still good.   Limitations Lifting;Standing;Walking;House hold activities   Patient Stated Goals walk without walker and have less pain   Currently in Pain? Yes   Pain Score 9    Pain Location Back   Pain Orientation Lower   Pain Descriptors / Indicators Aching;Tiring;Tightness;Tender;Sore   Pain Type Chronic pain   Aggravating Factors  any activity, walking pain up to 9-10/10   Pain Relieving Factors rest can get pain to a 0/10   Effect of Pain on Daily Activities difficulty with all ADL's            [facility] PT Assessment - 07/11/15 0001    Assessment   Medical Diagnosis right hip pain and weakness, LBP   Referring Provider Powers and [facility]ers   Onset Date/Surgical Date 05/28/15   Prior Therapy last year    Precautions   Precautions None   Restrictions   Weight Bearing Restrictions No   Balance Screen   Has the patient fallen in the past 6 months Yes   How many times? 1   Has the patient had a decrease in activity level because of a fear of falling?  No   Is the patient reluctant to leave their home because of a fear of falling?  No   [employer] residence   Additional Comments retirement community, has elevators, does not do housework   Prior Function   Level of Independence Independent with household mobility with device   Leisure does not exercise   Posture/Postural Control   Posture Comments fwd head, rounded shoulders, leans to the left   AROM   Overall AROM Comments left ankle has minimal motions actively, right hip abduction 5 degrees, also difficulty with left hip abduction due to wakness of the right hip for stance, has trendelenberg sign on the right.  lumbar ROM is decreased 50% for extension and side bending, flexion WFL's   Strength   Overall Strength Comments left ankle  is 1/5, left hip 4-/5, right hip 3+/5 except for right hip abduction 2+/5 , right hip ER 2/5   Flexibility   Soft Tissue Assessment /Muscle Length --  very tight hip flexors and quads, some tightness HS   Palpation   Palpation comment tightness in the bilateral lumbar parapsinals and very tender in the right buttock along the ilium , right GT and buttock   Ambulation/Gait   Gait Comments using a SPC, very slow, significant trendelenberg on the right, with short stance phase on the right, really leans the trunk forward and to the right with stance phase, very poor gait   Timed Up and Go Test   Normal TUG (seconds) 25                   [facility] Adult PT Treatment/Exercise - 07/11/15 0001    Lumbar Exercises: Aerobic   Elliptical Nustep level 4 x 5 minutes   Knee/Hip Exercises: Machines for Strengthening   Cybex Leg Press 20# 2x10   Modalities   Modalities Moist  Heat   Moist Heat Therapy   Number Minutes Moist Heat 15 Minutes   Moist Heat Location Lumbar Spine  ice to right hip   Electrical Stimulation   Electrical Stimulation Location low back   Electrical Stimulation Action IFC   Electrical Stimulation Parameters tolerance   Electrical Stimulation Goals Pain  PT Short Term Goals - 07/27/2015 1131    PT SHORT TERM GOAL #1   Title independent with initial HEP   Time 2   Period Weeks   Status New           PT Long Term Goals - 07/27/15 1131    PT LONG TERM GOAL #1   Title decrease Tug time to 15 seconds   Time 12   Period Weeks   Status New   PT LONG TERM GOAL #2   Title decrease pain 50%   Time 12   Period Weeks   Status New   PT LONG TERM GOAL #3   Title walk with a SPC x 200 feet   Time 12   Period Weeks   Status New   PT LONG TERM GOAL #4   Title increase right hip abduction to 15 degrees   Time 12   Period Weeks   Status New               Plan - Jul 27, 2015 1123    Clinical Impression Statement Patient was discharged from PT in December, she reports a fall around Christmas, she reports that she started having more hip and back pain and describes that she has had more difficulty walking. She is using the walker to walk at home, she tried a cane to get in here today, the gait was very poor with fwd trunk lean and trendelenberg pattern.  She is very sore and tender in the right hip area, tight and tender in the lumbar area, she is very weak with the right hip and the left ankle.   Pt will benefit from skilled therapeutic intervention in order to improve on the following deficits Abnormal gait;Decreased activity tolerance;Decreased balance;Decreased mobility;Decreased range of motion;Difficulty walking;Decreased strength;Increased muscle spasms;Pain   Rehab Potential Good   PT Frequency 2x / week   PT Duration 12 weeks   PT Treatment/Interventions Electrical Stimulation;Moist  Heat;Cryotherapy;Gait training;Functional mobility training;Therapeutic activities;Therapeutic exercise;Balance training;Neuromuscular re-education;Patient/family education;Manual techniques   PT Next Visit Plan PT will resume treatment for the back pain and hip weakness.     Consulted and Agree with Plan of Care Patient          G-Codes - 2015-07-27 1132    Functional Assessment Tool Used foto 73% limitation   Functional Limitation Mobility: Walking and moving around   Mobility: Walking and Moving Around Current Status [phone]) At least 60 percent but less than 80 percent impaired, limited or restricted   Mobility: Walking and Moving Around Goal Status [phone]) At least 40 percent but less than 60 percent impaired, limited or restricted       Problem List Patient Active Problem List   Diagnosis Date Noted  . Postoperative anemia due to acute blood loss 12/30/2014  . Essential hypertension, benign 07/04/2014  . Accelerated hypertension 05/05/2014  . Wheezing 03/30/2014  . Pulmonary embolism ([place]) 10/14/2013  . Acute respiratory failure ([place]) 10/14/2013  . Chest pain 10/14/2013  . Anemia 10/14/2013  . PE (pulmonary embolism) 10/14/2013  . Urinary frequency 04/07/2013  . Urinary incontinence 04/07/2013  . Right gluteus tear 12/28/2012  . Generalized anxiety disorder 08/19/2012  . Unspecified constipation 08/19/2012  . GERD (gastroesophageal reflux disease) 08/19/2012  . Overweight (BMI 25.0-29.9) 08/11/2012  . Expected blood loss anemia 08/11/2012  . Knee pain, left 03/31/2012  . Depression 09/10/2011  . S/P Nissen fundoplication (without gastrostomy tube) procedure 07/26/2011  . Chronic cough 01/09/2011  . RESTLESS LEG SYNDROME  05/05/2008  . HYPERLIPIDEMIA 09/14/2007  . Osteopenia 09/14/2007    Sumner Boast., PT 07/11/2015, 11:47 AM  [address] [place], [place], [address] Phone: [phone]    Fax:  [phone]  Name: Felicia Cruz MRN: [id number] Date of Birth: 01-04-1941

## 2015-07-17 ENCOUNTER — Encounter: Payer: Self-pay | Admitting: Physical Therapy

## 2015-07-17 ENCOUNTER — Ambulatory Visit: Payer: Medicare Other | Admitting: Physical Therapy

## 2015-07-17 DIAGNOSIS — R262 Difficulty in walking, not elsewhere classified: Secondary | ICD-10-CM | POA: Diagnosis not present

## 2015-07-17 DIAGNOSIS — M21372 Foot drop, left foot: Secondary | ICD-10-CM | POA: Diagnosis not present

## 2015-07-17 DIAGNOSIS — M545 Low back pain, unspecified: Secondary | ICD-10-CM

## 2015-07-17 DIAGNOSIS — R29898 Other symptoms and signs involving the musculoskeletal system: Secondary | ICD-10-CM

## 2015-07-17 DIAGNOSIS — M6289 Other specified disorders of muscle: Secondary | ICD-10-CM | POA: Diagnosis not present

## 2015-07-17 NOTE — Therapy (Signed)
[place] [place], [place], [address] Phone: [phone]   Fax:  [phone]  Physical Therapy Treatment  Patient Details  Name: Felicia Cruz MRN: [id number] Date of Birth: Feb 01, 1941 Referring Provider: Harold Hedge  Encounter Date: 07/17/2015      PT End of Session - 07/17/15 1559    Visit Number 2   Date for PT Re-Evaluation 09/08/15   PT Start Time 1520   PT Stop Time 1618   PT Time Calculation (min) 58 min   Activity Tolerance Patient tolerated treatment well   Behavior During Therapy [facility] for tasks assessed/performed      Past Medical History  Diagnosis Date  . RLS (restless legs syndrome)   . Esophageal stricture 2010  . Osteopenia   . Hyperlipemia   . Arthritis   . Kidney stones   . GERD (gastroesophageal reflux disease)   . Substance abuse     RECOVERING ALCOLHOLIC  . Alcoholism ([place])     HAS NOT HAD [place]  . Cataract   . 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 Hiatal hernia   . DDD (degenerative disc disease), lumbar   . Chronic back pain     Past Surgical History  Procedure Laterality Date  . Lumbar disc surgery  2008  . Facial cosmetic surgery      eyes, nose  . Abdominoplasty    . Liposuction  15 to 20 yrs ago  . Surgery for kidney stones    . Right index finger surgery  06/19/11    AT [facility]  . Laparoscopic nissen fundoplication  [id number]    Procedure: LAPAROSCOPIC NISSEN FUNDOPLICATION;  Surgeon: Pedro Earls, MD;  [facility];  Service: General;  Laterality: N/A;  Laparoscopic Nissen repair of hiatal hernia  with lighted bougie  . Esophageal manometry  02/17/2012    Procedure: ESOPHAGEAL MANOMETRY (EM);  Surgeon: Pedro Earls, MD;  [facility];  Service: Endoscopy;  Laterality: N/A;  . Dilation and curettage of uterus  many yrs ago    x 2  . Total knee arthroplasty Left 08/10/2012    Procedure:  TOTAL KNEE ARTHROPLASTY;  Surgeon: Mauri Pole, MD;  [facility];  Service: Orthopedics;  Laterality: Left;  . Joint replacement      left  . Open surgical repair of gluteal tendon Right 12/28/2012    Procedure: RIGHT GLUTEAL TENDON REPAIR;  Surgeon: Mauri Pole, MD;  [facility];  Service: Orthopedics;  Laterality: Right;    There were no vitals filed for this visit.  Visit Diagnosis:  Midline low back pain without sciatica  Difficulty walking  Weakness of both hips  Foot drop, left      Subjective Assessment - 07/17/15 1526    Subjective I am really tired, I drove a lot this morning.   Currently in Pain? Yes   Pain Score 5    Pain Location Back   Pain Orientation Lower   Pain Descriptors / Indicators Aching;Sore                         [facility] Adult PT Treatment/Exercise - 07/17/15 0001    Lumbar Exercises: Aerobic   Elliptical NuStep L5 x6 minutes   Knee/Hip Exercises: Stretches   Piriformis Stretch 30 seconds;3 reps   Other Knee/Hip Stretches IR and adductor stretches   Knee/Hip Exercises:  Machines for Strengthening   Cybex Knee Extension 10# 3x10   Cybex Knee Flexion 35# 2x10, then 25# 1x10   Cybex Leg Press 30# 2x15, then single leg 20 # 2x10 each   Knee/Hip Exercises: Supine   Bridges 3 sets;10 reps   Bridges Limitations feet on ball   Other Supine Knee/Hip Exercises standing hip abduction and extension 3# eachj with help   Moist Heat Therapy   Number Minutes Moist Heat 15 Minutes   Moist Heat Location Lumbar Spine   Electrical Stimulation   Electrical Stimulation Location low back   Electrical Stimulation Action IFC   Electrical Stimulation Parameters tolerance   Electrical Stimulation Goals Pain                  PT Short Term Goals - 07/11/15 1131    PT SHORT TERM GOAL #1   Title independent with initial HEP   Time 2   Period Weeks   Status New           PT Long Term Goals - 07/11/15 1131    PT LONG  TERM GOAL #1   Title decrease Tug time to 15 seconds   Time 12   Period Weeks   Status New   PT LONG TERM GOAL #2   Title decrease pain 50%   Time 12   Period Weeks   Status New   PT LONG TERM GOAL #3   Title walk with a SPC x 200 feet   Time 12   Period Weeks   Status New   PT LONG TERM GOAL #4   Title increase right hip abduction to 15 degrees   Time 12   Period Weeks   Status New               Plan - 07/17/15 1559    Clinical Impression Statement Left hip is very weak for abduction.  Had great difficulty with this in standing.  Also weak with hip extension.  Walking better with [facility] today   PT Next Visit Plan continue to address gluteus medius and maximus   Consulted and Agree with Plan of Care Patient        Problem List Patient Active Problem List   Diagnosis Date Noted  . Postoperative anemia due to acute blood loss 12/30/2014  . Essential hypertension, benign 07/04/2014  . Accelerated hypertension 05/05/2014  . Wheezing 03/30/2014  . Pulmonary embolism ([place]) 10/14/2013  . Acute respiratory failure ([place]) 10/14/2013  . Chest pain 10/14/2013  . Anemia 10/14/2013  . PE (pulmonary embolism) 10/14/2013  . Urinary frequency 04/07/2013  . Urinary incontinence 04/07/2013  . Right gluteus tear 12/28/2012  . Generalized anxiety disorder 08/19/2012  . Unspecified constipation 08/19/2012  . GERD (gastroesophageal reflux disease) 08/19/2012  . Overweight (BMI 25.0-29.9) 08/11/2012  . Expected blood loss anemia 08/11/2012  . Knee pain, left 03/31/2012  . Depression 09/10/2011  . S/P Nissen fundoplication (without gastrostomy tube) procedure 07/26/2011  . Chronic cough 01/09/2011  . RESTLESS LEG SYNDROME 05/05/2008  . HYPERLIPIDEMIA 09/14/2007  . Osteopenia 09/14/2007    Sumner Boast., PT 07/17/2015, 4:01 PM  [place] [address] [place], [place], [address] Phone: [phone]   Fax:   [phone]  Name: Felicia Cruz MRN: [id number] Date of Birth: 1940-11-04

## 2015-07-19 ENCOUNTER — Other Ambulatory Visit: Payer: Self-pay

## 2015-07-19 NOTE — Patient Outreach (Signed)
[place] [facility]) Care Management  07/19/2015  AMARYS VENTERS 1940/07/16 [id number]  Telephone call to patient regarding NEXTGEN high risk referral.  Unable to reach patient. HIPAA compliant voice message left with call back phone number.   PLAN: RNCM will attempt 2nd telephone outreach to patient within 1 week.   Quinn Plowman RN,BSN,CCM [facility] Telephonic  [phone]

## 2015-07-20 ENCOUNTER — Encounter: Payer: Self-pay | Admitting: Physical Therapy

## 2015-07-20 ENCOUNTER — Ambulatory Visit: Payer: Medicare Other | Admitting: Physical Therapy

## 2015-07-20 DIAGNOSIS — R262 Difficulty in walking, not elsewhere classified: Secondary | ICD-10-CM | POA: Diagnosis not present

## 2015-07-20 DIAGNOSIS — M545 Low back pain, unspecified: Secondary | ICD-10-CM

## 2015-07-20 DIAGNOSIS — M6289 Other specified disorders of muscle: Secondary | ICD-10-CM | POA: Diagnosis not present

## 2015-07-20 DIAGNOSIS — R29898 Other symptoms and signs involving the musculoskeletal system: Secondary | ICD-10-CM

## 2015-07-20 DIAGNOSIS — M21372 Foot drop, left foot: Secondary | ICD-10-CM | POA: Diagnosis not present

## 2015-07-20 NOTE — Therapy (Signed)
[place], [place], [address] Phone: [phone]   Fax:  [phone]  Physical Therapy Treatment  Patient Details  Name: Felicia Cruz MRN: [id number] Date of Birth: 1941/03/03 Referring Provider: Harold Hedge  Encounter Date: 07/20/2015      PT End of Session - 07/20/15 1610    Visit Number 3   Date for PT Re-Evaluation 09/08/15   PT Start Time 1525   PT Stop Time 1630   PT Time Calculation (min) 65 min      Past Medical History  Diagnosis Date  . RLS (restless legs syndrome)   . Esophageal stricture 2010  . Osteopenia   . Hyperlipemia   . Arthritis   . Kidney stones   . GERD (gastroesophageal reflux disease)   . Substance abuse     RECOVERING ALCOLHOLIC  . Alcoholism ([place])     HAS NOT HAD [employer]  . Cataract   . 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 Hiatal hernia   . DDD (degenerative disc disease), lumbar   . Chronic back pain     Past Surgical History  Procedure Laterality Date  . Lumbar disc surgery  2008  . Facial cosmetic surgery      eyes, nose  . Abdominoplasty    . Liposuction  15 to 20 yrs ago  . Surgery for kidney stones    . Right index finger surgery  06/19/11    AT [facility]  . Laparoscopic nissen fundoplication  [id number]    Procedure: LAPAROSCOPIC NISSEN FUNDOPLICATION;  Surgeon: Pedro Earls, MD;  [facility];  Service: General;  Laterality: N/A;  Laparoscopic Nissen repair of hiatal hernia  with lighted bougie  . Esophageal manometry  02/17/2012    Procedure: ESOPHAGEAL MANOMETRY (EM);  Surgeon: Pedro Earls, MD;  [facility];  Service: Endoscopy;  Laterality: N/A;  . Dilation and curettage of uterus  many yrs ago    x 2  . Total knee arthroplasty Left 08/10/2012    Procedure: TOTAL KNEE ARTHROPLASTY;  Surgeon: Mauri Pole, MD;  [facility];  Service: Orthopedics;  Laterality:  Left;  . Joint replacement      left  . Open surgical repair of gluteal tendon Right 12/28/2012    Procedure: RIGHT GLUTEAL TENDON REPAIR;  Surgeon: Mauri Pole, MD;  [facility];  Service: Orthopedics;  Laterality: Right;    There were no vitals filed for this visit.  Visit Diagnosis:  Midline low back pain without sciatica  Difficulty walking  Weakness of both hips      Subjective Assessment - 07/20/15 1525    Subjective amb in with SPC fwd flexed and poor gait   Currently in Pain? Yes   Pain Score 2    Pain Location Back   Pain Orientation Lower                         [facility] Adult PT Treatment/Exercise - 07/20/15 0001    Lumbar Exercises: Aerobic   Elliptical NuStep L5 x6 minutes   Knee/Hip Exercises: Machines for Strengthening   Cybex Knee Extension 10# 3x10   Cybex Knee Flexion 35# 2x10, then 25# 1x10   Cybex Leg Press 30# 2x15, then single leg 20 # 2x10 each   Knee/Hip Exercises: Supine   Bridges 3 sets;10 reps   Bridges Limitations feet  on ball   Bridges with [employer] Both;2 sets;10 reps   Straight Leg Raises Both;1 set;10 reps  with abd red tband   Other Supine Knee/Hip Exercises standing hip abduction and extension 3# eachj with help   Other Supine Knee/Hip Exercises hip IR/ER 2 sets 10 yellow tband    Moist Heat Therapy   Number Minutes Moist Heat 15 Minutes   Moist Heat Location Lumbar Spine   Electrical Stimulation   Electrical Stimulation Location low back   Electrical Stimulation Action IFC   Electrical Stimulation Goals Pain   Manual Therapy   Manual therapy comments PROM HS, piriformis and ITB bilaterally                  PT Short Term Goals - 07/11/15 1131    PT SHORT TERM GOAL #1   Title independent with initial HEP   Time 2   Period Weeks   Status New           PT Long Term Goals - 07/11/15 1131    PT LONG TERM GOAL #1   Title decrease Tug time to 15 seconds   Time 12   Period Weeks   Status  New   PT LONG TERM GOAL #2   Title decrease pain 50%   Time 12   Period Weeks   Status New   PT LONG TERM GOAL #3   Title walk with a SPC x 200 feet   Time 12   Period Weeks   Status New   PT LONG TERM GOAL #4   Title increase right hip abduction to 15 degrees   Time 12   Period Weeks   Status New               Plan - 07/20/15 1611    Clinical Impression Statement very weak left hip and when ex done in standing increases LBP d/t compensations. Tolerated supine ther ex well but did require verb and tactile cuing   PT Next Visit Plan continue to address gluteus medius and maximus        Problem List Patient Active Problem List   Diagnosis Date Noted  . Postoperative anemia due to acute blood loss 12/30/2014  . Essential hypertension, benign 07/04/2014  . Accelerated hypertension 05/05/2014  . Wheezing 03/30/2014  . Pulmonary embolism ([place]) 10/14/2013  . Acute respiratory failure ([place]) 10/14/2013  . Chest pain 10/14/2013  . Anemia 10/14/2013  . PE (pulmonary embolism) 10/14/2013  . Urinary frequency 04/07/2013  . Urinary incontinence 04/07/2013  . Right gluteus tear 12/28/2012  . Generalized anxiety disorder 08/19/2012  . Unspecified constipation 08/19/2012  . GERD (gastroesophageal reflux disease) 08/19/2012  . Overweight (BMI 25.0-29.9) 08/11/2012  . Expected blood loss anemia 08/11/2012  . Knee pain, left 03/31/2012  . Depression 09/10/2011  . S/P Nissen fundoplication (without gastrostomy tube) procedure 07/26/2011  . Chronic cough 01/09/2011  . RESTLESS LEG SYNDROME 05/05/2008  . HYPERLIPIDEMIA 09/14/2007  . Osteopenia 09/14/2007    PAYSEUR,ANGIE PTA 07/20/2015, 4:12 PM  [place] [address] [place], [place], [address] Phone: [phone]   Fax:  [phone]  Name: Felicia Cruz MRN: [id number] Date of Birth: 1940-06-22

## 2015-07-24 ENCOUNTER — Other Ambulatory Visit: Payer: Self-pay

## 2015-07-24 NOTE — Patient Outreach (Signed)
[place] [facility]) Care Management  07/24/2015  Felicia Cruz 1940/07/14 [id number]  Second telephone call to patient regarding NEXTGEN high risk referral. Unable to reach patient. HIPAA compliant voice message left with call back phone number.   PLAN:  RNCM will attempt 3rd telephone outreach to patient within 1 week.   Quinn Plowman RN,BSN,CCM [facility] Telephonic  [phone]

## 2015-07-25 ENCOUNTER — Encounter: Payer: Self-pay | Admitting: Physical Therapy

## 2015-07-25 ENCOUNTER — Ambulatory Visit: Payer: Medicare Other | Admitting: Physical Therapy

## 2015-07-25 DIAGNOSIS — R262 Difficulty in walking, not elsewhere classified: Secondary | ICD-10-CM

## 2015-07-25 DIAGNOSIS — R29898 Other symptoms and signs involving the musculoskeletal system: Secondary | ICD-10-CM

## 2015-07-25 DIAGNOSIS — M545 Low back pain, unspecified: Secondary | ICD-10-CM

## 2015-07-25 DIAGNOSIS — M6289 Other specified disorders of muscle: Secondary | ICD-10-CM | POA: Diagnosis not present

## 2015-07-25 DIAGNOSIS — M21372 Foot drop, left foot: Secondary | ICD-10-CM | POA: Diagnosis not present

## 2015-07-25 NOTE — Therapy (Signed)
[place] [address] [place], [place], [address] Phone: [phone]   Fax:  [phone]  Physical Therapy Treatment  Patient Details  Name: Felicia Cruz MRN: [id number] Date of Birth: [birth year] Referring Provider: Harold Hedge  Encounter Date: 07/25/2015      PT End of Session - 07/25/15 1459    Visit Number 4   Date for PT Re-Evaluation 09/08/15   PT Start Time 1454   PT Stop Time 1547   PT Time Calculation (min) 53 min   Activity Tolerance Patient tolerated treatment well   Behavior During Therapy [facility] for tasks assessed/performed      Past Medical History  Diagnosis Date  . RLS (restless legs syndrome)   . Esophageal stricture 2010  . Osteopenia   . Hyperlipemia   . Arthritis   . Kidney stones   . GERD (gastroesophageal reflux disease)   . Substance abuse     RECOVERING ALCOLHOLIC  . Alcoholism ([place])     HAS NOT HAD [place]  . Cataract   . 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 Hiatal hernia   . DDD (degenerative disc disease), lumbar   . Chronic back pain     Past Surgical History  Procedure Laterality Date  . Lumbar disc surgery  2008  . Facial cosmetic surgery      eyes, nose  . Abdominoplasty    . Liposuction  15 to 20 yrs ago  . Surgery for kidney stones    . Right index finger surgery  06/19/11    AT [facility]  . Laparoscopic nissen fundoplication  [id number]    Procedure: LAPAROSCOPIC NISSEN FUNDOPLICATION;  Surgeon: Pedro Earls, MD;  [facility];  Service: General;  Laterality: N/A;  Laparoscopic Nissen repair of hiatal hernia  with lighted bougie  . Esophageal manometry  02/17/2012    Procedure: ESOPHAGEAL MANOMETRY (EM);  Surgeon: Pedro Earls, MD;  [facility];  Service: Endoscopy;  Laterality: N/A;  . Dilation and curettage of uterus  many yrs ago    x 2  . Total knee arthroplasty Left 08/10/2012    Procedure:  TOTAL KNEE ARTHROPLASTY;  Surgeon: Mauri Pole, MD;  [facility];  Service: Orthopedics;  Laterality: Left;  . Joint replacement      left  . Open surgical repair of gluteal tendon Right 12/28/2012    Procedure: RIGHT GLUTEAL TENDON REPAIR;  Surgeon: Mauri Pole, MD;  [facility];  Service: Orthopedics;  Laterality: Right;    There were no vitals filed for this visit.  Visit Diagnosis:  Midline low back pain without sciatica  Difficulty walking  Weakness of both hips      Subjective Assessment - 07/25/15 1454    Subjective Patient reports that she went to a gas station bathroom on Saturday night and slipped and fell.  she reports that she cracked her ribs on the left   Currently in Pain? Yes   Pain Score 5    Pain Location Rib cage   Pain Orientation Left   Pain Descriptors / Indicators Aching;Sore                         [facility] Adult PT Treatment/Exercise - 07/25/15 0001    Lumbar Exercises: Aerobic   Elliptical NuStep L5 x6 minutes   Tread Mill 1.4 MPH x 3 minutes  Knee/Hip Exercises: Stretches   Passive Hamstring Stretch 3 reps;20 seconds   Hip Flexor Stretch 3 reps;20 seconds   Piriformis Stretch 30 seconds;3 reps   Other Knee/Hip Stretches IR and adductor stretches   Knee/Hip Exercises: Machines for Strengthening   Cybex Knee Extension 10# 2x10   Cybex Knee Flexion 35# 2x10   Cybex Leg Press 30# 2x15, then single leg 20 # 2x10 each   Hip Cybex 8" step ups   Knee/Hip Exercises: Supine   Other Supine Knee/Hip Exercises standing hip abduction and extension 3# eachj with help   Electrical Stimulation   Electrical Stimulation Location low back   Electrical Stimulation Action IFC   Electrical Stimulation Parameters supine   Electrical Stimulation Goals Pain                  PT Short Term Goals - 07/11/15 1131    PT SHORT TERM GOAL #1   Title independent with initial HEP   Time 2   Period Weeks   Status New            PT Long Term Goals - 07/11/15 1131    PT LONG TERM GOAL #1   Title decrease Tug time to 15 seconds   Time 12   Period Weeks   Status New   PT LONG TERM GOAL #2   Title decrease pain 50%   Time 12   Period Weeks   Status New   PT LONG TERM GOAL #3   Title walk with a SPC x 200 feet   Time 12   Period Weeks   Status New   PT LONG TERM GOAL #4   Title increase right hip abduction to 15 degrees   Time 12   Period Weeks   Status New               Plan - 07/25/15 1500    Clinical Impression Statement Patient with increased left rib and flank pain from a fall on Saturday night.  Has bruising along the left rib cage, reports that x-rays showed "cracked" ribs   PT Next Visit Plan continue to address gluteus medius and maximus   Consulted and Agree with Plan of Care Patient        Problem List Patient Active Problem List   Diagnosis Date Noted  . Postoperative anemia due to acute blood loss 12/30/2014  . Essential hypertension, benign 07/04/2014  . Accelerated hypertension 05/05/2014  . Wheezing 03/30/2014  . Pulmonary embolism ([place]) 10/14/2013  . Acute respiratory failure ([place]) 10/14/2013  . Chest pain 10/14/2013  . Anemia 10/14/2013  . PE (pulmonary embolism) 10/14/2013  . Urinary frequency 04/07/2013  . Urinary incontinence 04/07/2013  . Right gluteus tear 12/28/2012  . Generalized anxiety disorder 08/19/2012  . Unspecified constipation 08/19/2012  . GERD (gastroesophageal reflux disease) 08/19/2012  . Overweight (BMI 25.0-29.9) 08/11/2012  . Expected blood loss anemia 08/11/2012  . Knee pain, left 03/31/2012  . Depression 09/10/2011  . S/P Nissen fundoplication (without gastrostomy tube) procedure 07/26/2011  . Chronic cough 01/09/2011  . RESTLESS LEG SYNDROME 05/05/2008  . HYPERLIPIDEMIA 09/14/2007  . Osteopenia 09/14/2007    Sumner Boast., PT 07/25/2015, 3:30 PM  [place] [address] [place], [place], [address] Phone: [phone]   Fax:  [phone]  Name: ENEYDA BOLINSKI MRN: [id number] Date of Birth: [birth year]

## 2015-07-26 ENCOUNTER — Other Ambulatory Visit: Payer: Self-pay

## 2015-07-26 ENCOUNTER — Telehealth: Payer: Self-pay | Admitting: Family Medicine

## 2015-07-26 NOTE — Patient Outreach (Signed)
[place] [facility]) Care Management  07/26/2015  Felicia Cruz May 28, 1941 [id number]  SUBJECTIVE: Telephone call to patient regarding NEXTGEN high risk referral.  HIPAA verified with patient. Discussed and offered [facility] care management services to patient. Patient refused services.  Patient states she is managing her health conditions and she has good relationships with her doctors if she needs anything.   PLAN: RNCM will refer patient to Josepha Pigg to close due to refusal of services.  RNCM will notify patients primary MD of refusal of services and request assistance with engagement to [facility] care management services.  RNCM called and spoke with Constance Holster with Dr. Honor Junes office. Requested assistance with engaging patient for services with [facility] care management.   Quinn Plowman RN,BSN,CCM [facility] Telephonic  [phone]

## 2015-07-26 NOTE — Telephone Encounter (Addendum)
fyi Pt is on dr todd high risk medicare list THN  attempt to engage with pt for services. Pt decline. Please assistant with engaging pt to thn services.

## 2015-07-28 ENCOUNTER — Ambulatory Visit: Payer: Medicare Other | Admitting: Physical Therapy

## 2015-07-28 ENCOUNTER — Encounter: Payer: Self-pay | Admitting: Physical Therapy

## 2015-07-28 DIAGNOSIS — R262 Difficulty in walking, not elsewhere classified: Secondary | ICD-10-CM

## 2015-07-28 DIAGNOSIS — M6289 Other specified disorders of muscle: Secondary | ICD-10-CM | POA: Diagnosis not present

## 2015-07-28 DIAGNOSIS — R29898 Other symptoms and signs involving the musculoskeletal system: Secondary | ICD-10-CM

## 2015-07-28 DIAGNOSIS — M545 Low back pain, unspecified: Secondary | ICD-10-CM

## 2015-07-28 DIAGNOSIS — M21372 Foot drop, left foot: Secondary | ICD-10-CM | POA: Diagnosis not present

## 2015-07-28 NOTE — Therapy (Signed)
[address] [place], [place], [address] Phone: [phone]   Fax:  [phone]  Physical Therapy Treatment  Patient Details  Name: Felicia Cruz MRN: [id number] Date of Birth: [birth year] Referring Provider: Harold Hedge  Encounter Date: 07/28/2015      PT End of Session - 07/28/15 1133    Visit Number 5   Date for PT Re-Evaluation 09/08/15   PT Start Time 1050   PT Stop Time 1148   PT Time Calculation (min) 58 min   Activity Tolerance Patient tolerated treatment well   Behavior During Therapy [facility] for tasks assessed/performed      Past Medical History  Diagnosis Date  . RLS (restless legs syndrome)   . Esophageal stricture 2010  . Osteopenia   . Hyperlipemia   . Arthritis   . Kidney stones   . GERD (gastroesophageal reflux disease)   . Substance abuse     RECOVERING ALCOLHOLIC  . Alcoholism ([place])     HAS NOT HAD [place]  . Cataract   . 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 Hiatal hernia   . DDD (degenerative disc disease), lumbar   . Chronic back pain     Past Surgical History  Procedure Laterality Date  . Lumbar disc surgery  2008  . Facial cosmetic surgery      eyes, nose  . Abdominoplasty    . Liposuction  15 to 20 yrs ago  . Surgery for kidney stones    . Right index finger surgery  06/19/11    AT [facility]  . Laparoscopic nissen fundoplication  [id number]    Procedure: LAPAROSCOPIC NISSEN FUNDOPLICATION;  Surgeon: Pedro Earls, MD;  [facility]: [facility];  Service: General;  Laterality: N/A;  Laparoscopic Nissen repair of hiatal hernia  with lighted bougie  . Esophageal manometry  02/17/2012    Procedure: ESOPHAGEAL MANOMETRY (EM);  Surgeon: Pedro Earls, MD;  [facility];  Service: Endoscopy;  Laterality: N/A;  . Dilation and curettage of uterus  many yrs ago    x 2  . Total knee arthroplasty Left 08/10/2012    Procedure:  TOTAL KNEE ARTHROPLASTY;  Surgeon: Mauri Pole, MD;  [facility];  Service: Orthopedics;  Laterality: Left;  . Joint replacement      left  . Open surgical repair of gluteal tendon Right 12/28/2012    Procedure: RIGHT GLUTEAL TENDON REPAIR;  Surgeon: Mauri Pole, MD;  [facility];  Service: Orthopedics;  Laterality: Right;    There were no vitals filed for this visit.  Visit Diagnosis:  Midline low back pain without sciatica  Difficulty walking  Weakness of both hips      Subjective Assessment - 07/28/15 1058    Subjective She reports that she is very sore in the ribs and chest.   Currently in Pain? Yes   Pain Score 6    Pain Location Rib cage   Pain Orientation Left   Aggravating Factors  the fall really hurt me   Pain Relieving Factors the heat feels good                         [facility] Adult PT Treatment/Exercise - 07/28/15 0001    High Level Balance   High Level Balance Activities Side stepping;Backward walking;Marching forwards;Tandem walking   High Level Balance Comments sit to stand activities,  trying to not use hand   Lumbar Exercises: Aerobic   Elliptical R=5, I=5 x 2 minutes   UBE (Upper Arm Bike) NuStep Level 5 x 6 minutes   Knee/Hip Exercises: Stretches   Passive Hamstring Stretch 3 reps;20 seconds   Hip Flexor Stretch 3 reps;20 seconds   Piriformis Stretch 30 seconds;3 reps   Other Knee/Hip Stretches IR and adductor stretches   Knee/Hip Exercises: Machines for Strengthening   Cybex Knee Extension 10# 2x10   Cybex Knee Flexion 35# 2x10   Cybex Leg Press 30# 2x15, then single leg 20 # 2x10 each   Hip Cybex 8" step ups   Knee/Hip Exercises: Supine   Other Supine Knee/Hip Exercises standing hip abduction and extension 3# eachj with help   Moist Heat Therapy   Number Minutes Moist Heat 15 Minutes   Moist Heat Location Lumbar Spine   Electrical Stimulation   Electrical Stimulation Location low back   Electrical Stimulation Action  IFC   Electrical Stimulation Parameters supine   Electrical Stimulation Goals Pain                  PT Short Term Goals - 07/28/15 1134    PT SHORT TERM GOAL #1   Title independent with initial HEP   Status Achieved           PT Long Term Goals - 07/11/15 1131    PT LONG TERM GOAL #1   Title decrease Tug time to 15 seconds   Time 12   Period Weeks   Status New   PT LONG TERM GOAL #2   Title decrease pain 50%   Time 12   Period Weeks   Status New   PT LONG TERM GOAL #3   Title walk with a SPC x 200 feet   Time 12   Period Weeks   Status New   PT LONG TERM GOAL #4   Title increase right hip abduction to 15 degrees   Time 12   Period Weeks   Status New               Plan - 07/28/15 1133    Clinical Impression Statement Continues to have increased rib pain.  Worked hard today and the elliptical really got her winded   PT Next Visit Plan continue to address gluteus medius and maximus   Consulted and Agree with Plan of Care Patient        Problem List Patient Active Problem List   Diagnosis Date Noted  . Postoperative anemia due to acute blood loss 12/30/2014  . Essential hypertension, benign 07/04/2014  . Accelerated hypertension 05/05/2014  . Wheezing 03/30/2014  . Pulmonary embolism ([place]) 10/14/2013  . Acute respiratory failure ([place]) 10/14/2013  . Chest pain 10/14/2013  . Anemia 10/14/2013  . PE (pulmonary embolism) 10/14/2013  . Urinary frequency 04/07/2013  . Urinary incontinence 04/07/2013  . Right gluteus tear 12/28/2012  . Generalized anxiety disorder 08/19/2012  . Unspecified constipation 08/19/2012  . GERD (gastroesophageal reflux disease) 08/19/2012  . Overweight (BMI 25.0-29.9) 08/11/2012  . Expected blood loss anemia 08/11/2012  . Knee pain, left 03/31/2012  . Depression 09/10/2011  . S/P Nissen fundoplication (without gastrostomy tube) procedure 07/26/2011  . Chronic cough 01/09/2011  . RESTLESS LEG SYNDROME 05/05/2008   . HYPERLIPIDEMIA 09/14/2007  . Osteopenia 09/14/2007    Sumner Boast., PT 07/28/2015, 11:35 AM  [place] [address] [place], [place], [address]  Phone: [phone]   Fax:  [phone]  Name: Felicia Cruz MRN: [id number] Date of Birth: 08-19-1940

## 2015-07-31 ENCOUNTER — Encounter: Payer: Self-pay | Admitting: Physical Therapy

## 2015-07-31 ENCOUNTER — Ambulatory Visit: Payer: Medicare Other | Admitting: Physical Therapy

## 2015-07-31 DIAGNOSIS — M545 Low back pain, unspecified: Secondary | ICD-10-CM

## 2015-07-31 DIAGNOSIS — R29898 Other symptoms and signs involving the musculoskeletal system: Secondary | ICD-10-CM

## 2015-07-31 DIAGNOSIS — M21372 Foot drop, left foot: Secondary | ICD-10-CM | POA: Diagnosis not present

## 2015-07-31 DIAGNOSIS — R262 Difficulty in walking, not elsewhere classified: Secondary | ICD-10-CM | POA: Diagnosis not present

## 2015-07-31 DIAGNOSIS — M6289 Other specified disorders of muscle: Secondary | ICD-10-CM | POA: Diagnosis not present

## 2015-07-31 NOTE — Telephone Encounter (Signed)
Dr Todd notified. 

## 2015-07-31 NOTE — Therapy (Signed)
[address] [place], [place], [address] Phone: [phone]   Fax:  [phone]  Physical Therapy Treatment  Patient Details  Name: Felicia Cruz MRN: [id number] Date of Birth: 10/31/1940 Referring Provider: Harold Hedge  Encounter Date: 07/31/2015      PT End of Session - 07/31/15 1434    Visit Number 6   Date for PT Re-Evaluation 09/08/15   PT Start Time [id number]   PT Stop Time 1503   PT Time Calculation (min) 58 min   Activity Tolerance Patient tolerated treatment well   Behavior During Therapy [facility] for tasks assessed/performed      Past Medical History  Diagnosis Date  . RLS (restless legs syndrome)   . Esophageal stricture 2010  . Osteopenia   . Hyperlipemia   . Arthritis   . Kidney stones   . GERD (gastroesophageal reflux disease)   . Substance abuse     RECOVERING ALCOLHOLIC  . Alcoholism ([place])     HAS NOT HAD [place]  . Cataract   . 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 Hiatal hernia   . DDD (degenerative disc disease), lumbar   . Chronic back pain     Past Surgical History  Procedure Laterality Date  . Lumbar disc surgery  2008  . Facial cosmetic surgery      eyes, nose  . Abdominoplasty    . Liposuction  15 to 20 yrs ago  . Surgery for kidney stones    . Right index finger surgery  06/19/11    AT [facility]  . Laparoscopic nissen fundoplication  [id number]    Procedure: LAPAROSCOPIC NISSEN FUNDOPLICATION;  Surgeon: Pedro Earls, MD;  [facility];  Service: General;  Laterality: N/A;  Laparoscopic Nissen repair of hiatal hernia  with lighted bougie  . Esophageal manometry  02/17/2012    Procedure: ESOPHAGEAL MANOMETRY (EM);  Surgeon: Pedro Earls, MD;  [facility];  Service: Endoscopy;  Laterality: N/A;  . Dilation and curettage of uterus  many yrs ago    x 2  . Total knee arthroplasty Left 08/10/2012    Procedure:  TOTAL KNEE ARTHROPLASTY;  Surgeon: Mauri Pole, MD;  [facility];  Service: Orthopedics;  Laterality: Left;  . Joint replacement      left  . Open surgical repair of gluteal tendon Right 12/28/2012    Procedure: RIGHT GLUTEAL TENDON REPAIR;  Surgeon: Mauri Pole, MD;  [facility];  Service: Orthopedics;  Laterality: Right;    There were no vitals filed for this visit.  Visit Diagnosis:  Midline low back pain without sciatica  Difficulty walking  Weakness of both hips  Foot drop, left      Subjective Assessment - 07/31/15 1411    Subjective My ribs are still a little sore but getting better   Currently in Pain? Yes   Pain Score 3    Pain Location Rib cage   Pain Relieving Factors treatment and time            [facility] PT Assessment - 07/31/15 0001    AROM   Overall AROM Comments lumbar ROM decreased 25%                     [facility] Adult PT Treatment/Exercise - 07/31/15 0001    High Level Balance   High Level Balance Activities Side stepping;Backward walking;Marching  forwards;Tandem walking   High Level Balance Comments sit to stand activities, trying to not use hand   Lumbar Exercises: Aerobic   Elliptical R=5, I=5 x 2 minutes   UBE (Upper Arm Bike) NuStep Level 5 x 6 minutes   Knee/Hip Exercises: Stretches   Passive Hamstring Stretch 3 reps;20 seconds   Hip Flexor Stretch 3 reps;20 seconds   Piriformis Stretch 30 seconds;3 reps   Other Knee/Hip Stretches IR and adductor stretches   Knee/Hip Exercises: Machines for Strengthening   Cybex Leg Press 50# 3x10, then single leg 30#   Knee/Hip Exercises: Sidelying   Hip ABduction 3 sets;10 reps   Clams 2x15 each side   Other Sidelying Knee/Hip Exercises hip IR with ball b/n knees and 2# on ankles, sit fit ankle exercises   Other Sidelying Knee/Hip Exercises feet on ball K2C, rotation and bridges, isometric abdominal ball crunch, 15# seated rows and lats   Moist Heat Therapy   Number Minutes Moist  Heat 15 Minutes   Moist Heat Location Lumbar Spine   Electrical Stimulation   Electrical Stimulation Location low back   Electrical Stimulation Action IFC   Electrical Stimulation Parameters supine   Electrical Stimulation Goals Pain                  PT Short Term Goals - 07/28/15 1134    PT SHORT TERM GOAL #1   Title independent with initial HEP   Status Achieved           PT Long Term Goals - 07/11/15 1131    PT LONG TERM GOAL #1   Title decrease Tug time to 15 seconds   Time 12   Period Weeks   Status New   PT LONG TERM GOAL #2   Title decrease pain 50%   Time 12   Period Weeks   Status New   PT LONG TERM GOAL #3   Title walk with a SPC x 200 feet   Time 12   Period Weeks   Status New   PT LONG TERM GOAL #4   Title increase right hip abduction to 15 degrees   Time 12   Period Weeks   Status New               Plan - 07/31/15 1435    Clinical Impression Statement The gluteus medius remains very weak.  She has to use a lot of compensation to lift leg in sidelying.  Needs cues to limit this and work the correct mms.   PT Next Visit Plan continue to address gluteus medius and maximus   Consulted and Agree with Plan of Care Patient        Problem List Patient Active Problem List   Diagnosis Date Noted  . Postoperative anemia due to acute blood loss 12/30/2014  . Essential hypertension, benign 07/04/2014  . Accelerated hypertension 05/05/2014  . Wheezing 03/30/2014  . Pulmonary embolism ([place]) 10/14/2013  . Acute respiratory failure ([place]) 10/14/2013  . Chest pain 10/14/2013  . Anemia 10/14/2013  . PE (pulmonary embolism) 10/14/2013  . Urinary frequency 04/07/2013  . Urinary incontinence 04/07/2013  . Right gluteus tear 12/28/2012  . Generalized anxiety disorder 08/19/2012  . Unspecified constipation 08/19/2012  . GERD (gastroesophageal reflux disease) 08/19/2012  . Overweight (BMI 25.0-29.9) 08/11/2012  . Expected blood loss anemia  08/11/2012  . Knee pain, left 03/31/2012  . Depression 09/10/2011  . S/P Nissen fundoplication (without gastrostomy tube) procedure 07/26/2011  . Chronic cough  01/09/2011  . RESTLESS LEG SYNDROME 05/05/2008  . HYPERLIPIDEMIA 09/14/2007  . Osteopenia 09/14/2007    Sumner Boast., PT 07/31/2015, 2:48 PM  [place] [address] [place], [place], [address] Phone: [phone]   Fax:  [phone]  Name: FATIM ARDINGER MRN: [id number] Date of Birth: 13-Jun-1940

## 2015-08-03 ENCOUNTER — Encounter: Payer: Self-pay | Admitting: Physical Therapy

## 2015-08-03 ENCOUNTER — Ambulatory Visit: Payer: Medicare Other | Attending: Neurological Surgery | Admitting: Physical Therapy

## 2015-08-03 DIAGNOSIS — M6289 Other specified disorders of muscle: Secondary | ICD-10-CM | POA: Insufficient documentation

## 2015-08-03 DIAGNOSIS — M545 Low back pain, unspecified: Secondary | ICD-10-CM

## 2015-08-03 DIAGNOSIS — R29898 Other symptoms and signs involving the musculoskeletal system: Secondary | ICD-10-CM

## 2015-08-03 DIAGNOSIS — R262 Difficulty in walking, not elsewhere classified: Secondary | ICD-10-CM | POA: Insufficient documentation

## 2015-08-03 DIAGNOSIS — M21372 Foot drop, left foot: Secondary | ICD-10-CM | POA: Insufficient documentation

## 2015-08-03 NOTE — Therapy (Signed)
[place] [address] [address], [place], [address] Phone: [phone]   Fax:  [phone]  Physical Therapy Treatment  Patient Details  Name: Felicia Cruz MRN: [id number] Date of Birth: 05-19-1941 Referring Provider: Harold Hedge  Encounter Date: 08/03/2015      PT End of Session - 08/03/15 1522    Visit Number 7   Date for PT Re-Evaluation 09/08/15   PT Start Time 1343   PT Stop Time 1445   PT Time Calculation (min) 62 min   Activity Tolerance Patient tolerated treatment well   Behavior During Therapy [facility] for tasks assessed/performed      Past Medical History  Diagnosis Date  . RLS (restless legs syndrome)   . Esophageal stricture 2010  . Osteopenia   . Hyperlipemia   . Arthritis   . Kidney stones   . GERD (gastroesophageal reflux disease)   . Substance abuse     RECOVERING ALCOLHOLIC  . Alcoholism ([place])     HAS NOT HAD [place]  . Cataract   . 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 Hiatal hernia   . DDD (degenerative disc disease), lumbar   . Chronic back pain     Past Surgical History  Procedure Laterality Date  . Lumbar disc surgery  2008  . Facial cosmetic surgery      eyes, nose  . Abdominoplasty    . Liposuction  15 to 20 yrs ago  . Surgery for kidney stones    . Right index finger surgery  06/19/11    AT [facility]  . Laparoscopic nissen fundoplication  6/81/2751    Procedure: LAPAROSCOPIC NISSEN FUNDOPLICATION;  Surgeon: Pedro Earls, MD;  [facility];  Service: General;  Laterality: N/A;  Laparoscopic Nissen repair of hiatal hernia  with lighted bougie  . Esophageal manometry  02/17/2012    Procedure: ESOPHAGEAL MANOMETRY (EM);  Surgeon: Pedro Earls, MD;  [facility];  Service: Endoscopy;  Laterality: N/A;  . Dilation and curettage of uterus  many yrs ago    x 2  . Total knee arthroplasty Left 08/10/2012    Procedure:  TOTAL KNEE ARTHROPLASTY;  Surgeon: Mauri Pole, MD;  [facility];  Service: Orthopedics;  Laterality: Left;  . Joint replacement      left  . Open surgical repair of gluteal tendon Right 12/28/2012    Procedure: RIGHT GLUTEAL TENDON REPAIR;  Surgeon: Mauri Pole, MD;  [facility];  Service: Orthopedics;  Laterality: Right;    There were no vitals filed for this visit.  Visit Diagnosis:  Midline low back pain without sciatica  Difficulty walking  Weakness of both hips  Foot drop, left      Subjective Assessment - 08/03/15 1345    Subjective I am still very sore in the left flank/ribs area some of the exercises we did I could feel a pulling   Currently in Pain? Yes   Pain Score 4    Pain Orientation Left            [facility] PT Assessment - 08/03/15 0001    High Level Balance   High Level Balance Activities Side stepping;Backward walking;Marching forwards;Tandem walking   High Level Balance Comments resisted gait, side to side and fwd   Timed Up and Go Test   Normal TUG (seconds) 18  [facility] Adult PT Treatment/Exercise - 08/03/15 0001    Lumbar Exercises: Aerobic   Elliptical R=5, I=5 x 2 minutes   UBE (Upper Arm Bike) NuStep Level 5 x 6 minutes, did not use arms today   Knee/Hip Exercises: Stretches   Other Knee/Hip Stretches IR and adductor stretches   Knee/Hip Exercises: Machines for Strengthening   Cybex Knee Extension 10# 2x15   Cybex Knee Flexion 35# 2x15   Cybex Leg Press 70# both legs 2x15, then single legs with 20#   Hip Cybex 8" step ups   Knee/Hip Exercises: Supine   Other Supine Knee/Hip Exercises hip IR/ER 2 sets 10 yellow tband    Knee/Hip Exercises: Sidelying   Clams 2x15 each side   Other Sidelying Knee/Hip Exercises hip IR with ball b/n knees and 2# on ankles, sit fit ankle exercises   Moist Heat Therapy   Number Minutes Moist Heat 15 Minutes   Moist Heat Location Lumbar Spine   Electrical Stimulation    Electrical Stimulation Location low back   Electrical Stimulation Action IFC   Electrical Stimulation Parameters supine   Electrical Stimulation Goals Pain                  PT Short Term Goals - 07/28/15 1134    PT SHORT TERM GOAL #1   Title independent with initial HEP   Status Achieved           PT Long Term Goals - 08/03/15 1526    PT LONG TERM GOAL #1   Title decrease Tug time to 15 seconds   Status Partially Met   PT LONG TERM GOAL #3   Title walk with a SPC x 200 feet   Status Partially Met               Plan - 08/03/15 1522    Clinical Impression Statement Patient had two instances of the left toe catching while walking.  She called when she left and had a fall in the building reporting that she caught her left foot and went down.  She deinied injury and was able to get up and walk without difficulty.   PT Next Visit Plan assess the effectiveness of an AFO   Consulted and Agree with Plan of Care Patient        Problem List Patient Active Problem List   Diagnosis Date Noted  . Postoperative anemia due to acute blood loss 12/30/2014  . Essential hypertension, benign 07/04/2014  . Accelerated hypertension 05/05/2014  . Wheezing 03/30/2014  . Pulmonary embolism ([place]) 10/14/2013  . Acute respiratory failure ([place]) 10/14/2013  . Chest pain 10/14/2013  . Anemia 10/14/2013  . PE (pulmonary embolism) 10/14/2013  . Urinary frequency 04/07/2013  . Urinary incontinence 04/07/2013  . Right gluteus tear 12/28/2012  . Generalized anxiety disorder 08/19/2012  . Unspecified constipation 08/19/2012  . GERD (gastroesophageal reflux disease) 08/19/2012  . Overweight (BMI 25.0-29.9) 08/11/2012  . Expected blood loss anemia 08/11/2012  . Knee pain, left 03/31/2012  . Depression 09/10/2011  . S/P Nissen fundoplication (without gastrostomy tube) procedure 07/26/2011  . Chronic cough 01/09/2011  . RESTLESS LEG SYNDROME 05/05/2008  . HYPERLIPIDEMIA 09/14/2007   . Osteopenia 09/14/2007    Sumner Boast., PT 08/03/2015, 3:27 PM  [place] [address] [place], [place], [address] Phone: [phone]   Fax:  [phone]  Name: Felicia Cruz MRN: [id number] Date of Birth: 10-Jan-1941

## 2015-08-09 ENCOUNTER — Ambulatory Visit: Payer: Medicare Other | Admitting: Physical Therapy

## 2015-08-09 ENCOUNTER — Encounter: Payer: Self-pay | Admitting: Physical Therapy

## 2015-08-09 DIAGNOSIS — M545 Low back pain, unspecified: Secondary | ICD-10-CM

## 2015-08-09 DIAGNOSIS — M21372 Foot drop, left foot: Secondary | ICD-10-CM

## 2015-08-09 DIAGNOSIS — R29898 Other symptoms and signs involving the musculoskeletal system: Secondary | ICD-10-CM

## 2015-08-09 DIAGNOSIS — R262 Difficulty in walking, not elsewhere classified: Secondary | ICD-10-CM

## 2015-08-09 DIAGNOSIS — M6289 Other specified disorders of muscle: Secondary | ICD-10-CM | POA: Diagnosis not present

## 2015-08-09 NOTE — Therapy (Signed)
[place], [place], [address] Phone: [phone]   Fax:  [phone]  Physical Therapy Treatment  Patient Details  Name: Felicia Cruz MRN: [id number] Date of Birth: 11-11-1940 Referring Provider: Harold Hedge  Encounter Date: 08/09/2015      PT End of Session - 08/09/15 1153    Visit Number 8   Date for PT Re-Evaluation 09/08/15   PT Start Time 1057   PT Stop Time 1200   PT Time Calculation (min) 63 min   Activity Tolerance Patient tolerated treatment well   Behavior During Therapy [facility] for tasks assessed/performed      Past Medical History  Diagnosis Date  . RLS (restless legs syndrome)   . Esophageal stricture 2010  . Osteopenia   . Hyperlipemia   . Arthritis   . Kidney stones   . GERD (gastroesophageal reflux disease)   . Substance abuse     RECOVERING ALCOLHOLIC  . Alcoholism ([place])     HAS NOT HAD [place]  . Cataract   . 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 Hiatal hernia   . DDD (degenerative disc disease), lumbar   . Chronic back pain     Past Surgical History  Procedure Laterality Date  . Lumbar disc surgery  2008  . Facial cosmetic surgery      eyes, nose  . Abdominoplasty    . Liposuction  15 to 20 yrs ago  . Surgery for kidney stones    . Right index finger surgery  06/19/11    AT [facility]  . Laparoscopic nissen fundoplication  8/56/3149    Procedure: LAPAROSCOPIC NISSEN FUNDOPLICATION;  Surgeon: Pedro Earls, MD;  [facility];  Service: General;  Laterality: N/A;  Laparoscopic Nissen repair of hiatal hernia  with lighted bougie  . Esophageal manometry  02/17/2012    Procedure: ESOPHAGEAL MANOMETRY (EM);  Surgeon: Pedro Earls, MD;  [facility];  Service: Endoscopy;  Laterality: N/A;  . Dilation and curettage of uterus  many yrs ago    x 2  . Total knee arthroplasty Left 08/10/2012    Procedure:  TOTAL KNEE ARTHROPLASTY;  Surgeon: Mauri Pole, MD;  [facility];  Service: Orthopedics;  Laterality: Left;  . Joint replacement      left  . Open surgical repair of gluteal tendon Right 12/28/2012    Procedure: RIGHT GLUTEAL TENDON REPAIR;  Surgeon: Mauri Pole, MD;  [facility];  Service: Orthopedics;  Laterality: Right;    There were no vitals filed for this visit.  Visit Diagnosis:  Midline low back pain without sciatica  Difficulty walking  Weakness of both hips  Foot drop, left      Subjective Assessment - 08/09/15 1148    Subjective Some soreness in the right thigh.  No falls.   Currently in Pain? Yes   Pain Score 4    Pain Location Back   Pain Orientation Lower   Pain Descriptors / Indicators Aching                         [facility] Adult PT Treatment/Exercise - 08/09/15 0001    Ambulation/Gait   Gait Comments gait training with FWW, she needed to have the walker raised up and instructions to have her body within the walker base.  She was leaning very far forward and had  the walker way out in front, we also uses an AFO on the left and used a SPC to walk with an off the shelf AFO that I had to decrease toe drag and the catching of the foot that has caused most of her falls.   Lumbar Exercises: Aerobic   Elliptical R=5, I=5 x 3 minutes   Knee/Hip Exercises: Stretches   Passive Hamstring Stretch 3 reps;20 seconds   Quad Stretch 3 reps;30 seconds   Hip Flexor Stretch 3 reps;20 seconds   Piriformis Stretch 30 seconds;3 reps   Knee/Hip Exercises: Machines for Strengthening   Cybex Knee Extension 10# 2x15   Cybex Knee Flexion 35# 2x15   Cybex Leg Press 70# both legs 2x15, then single legs with 20#   Knee/Hip Exercises: Sidelying   Clams 2x15 each side   Other Sidelying Knee/Hip Exercises hip IR with ball b/n knees and 2# on ankles, sit fit ankle exercises   Moist Heat Therapy   Number Minutes Moist Heat 15 Minutes   Moist Heat Location  Lumbar Spine   Electrical Stimulation   Electrical Stimulation Location low back   Electrical Stimulation Action IFC   Electrical Stimulation Parameters supine   Electrical Stimulation Goals Pain                  PT Short Term Goals - 07/28/15 1134    PT SHORT TERM GOAL #1   Title independent with initial HEP   Status Achieved           PT Long Term Goals - 08/09/15 1155    PT LONG TERM GOAL #1   Title decrease Tug time to 15 seconds   Status On-going   PT LONG TERM GOAL #2   Title decrease pain 50%   Status On-going   PT LONG TERM GOAL #3   Title walk with a SPC x 200 feet   Status Partially Met   PT LONG TERM GOAL #4   Title increase right hip abduction to 15 degrees   Status On-going               Plan - 08/09/15 1154    Clinical Impression Statement We tried an off the shelf AFO on the left foot today in the clinic, went over safety with walker and with SPC, felt that the walker was safer.  AFO definitely helped.   PT Next Visit Plan Continue to work on safety and fall prevention with AFO, as well as trying to increase functional strength of the hips   Consulted and Agree with Plan of Care Patient        Problem List Patient Active Problem List   Diagnosis Date Noted  . Postoperative anemia due to acute blood loss 12/30/2014  . Essential hypertension, benign 07/04/2014  . Accelerated hypertension 05/05/2014  . Wheezing 03/30/2014  . Pulmonary embolism ([place]) 10/14/2013  . Acute respiratory failure ([place]) 10/14/2013  . Chest pain 10/14/2013  . Anemia 10/14/2013  . PE (pulmonary embolism) 10/14/2013  . Urinary frequency 04/07/2013  . Urinary incontinence 04/07/2013  . Right gluteus tear 12/28/2012  . Generalized anxiety disorder 08/19/2012  . Unspecified constipation 08/19/2012  . GERD (gastroesophageal reflux disease) 08/19/2012  . Overweight (BMI 25.0-29.9) 08/11/2012  . Expected blood loss anemia 08/11/2012  . Knee pain, left  03/31/2012  . Depression 09/10/2011  . S/P Nissen fundoplication (without gastrostomy tube) procedure 07/26/2011  . Chronic cough 01/09/2011  . RESTLESS LEG SYNDROME 05/05/2008  . HYPERLIPIDEMIA 09/14/2007  .  Osteopenia 09/14/2007    Sumner Boast., PT 08/09/2015, 12:07 PM  [place] [address] [place], [place], [address] Phone: [phone]   Fax:  [phone]  Name: Felicia Cruz MRN: [id number] Date of Birth: 03/04/1941

## 2015-08-11 ENCOUNTER — Ambulatory Visit: Payer: Medicare Other | Admitting: Physical Therapy

## 2015-08-11 ENCOUNTER — Encounter: Payer: Self-pay | Admitting: Physical Therapy

## 2015-08-11 DIAGNOSIS — M545 Low back pain, unspecified: Secondary | ICD-10-CM

## 2015-08-11 DIAGNOSIS — R262 Difficulty in walking, not elsewhere classified: Secondary | ICD-10-CM | POA: Diagnosis not present

## 2015-08-11 DIAGNOSIS — M6289 Other specified disorders of muscle: Secondary | ICD-10-CM | POA: Diagnosis not present

## 2015-08-11 DIAGNOSIS — M21372 Foot drop, left foot: Secondary | ICD-10-CM | POA: Diagnosis not present

## 2015-08-11 DIAGNOSIS — R29898 Other symptoms and signs involving the musculoskeletal system: Secondary | ICD-10-CM

## 2015-08-11 NOTE — Therapy (Signed)
[place], [place], [address] Phone: [phone]   Fax:  [phone]  Physical Therapy Treatment  Patient Details  Name: Felicia Cruz MRN: [id number] Date of Birth: 18-Oct-1940 Referring Provider: Harold Hedge  Encounter Date: 08/11/2015      PT End of Session - 08/11/15 1154    Visit Number 9   Date for PT Re-Evaluation 09/08/15   PT Start Time 9024   PT Stop Time 1150   PT Time Calculation (min) 63 min   Activity Tolerance Patient tolerated treatment well   Behavior During Therapy [facility] for tasks assessed/performed      Past Medical History  Diagnosis Date  . RLS (restless legs syndrome)   . Esophageal stricture 2010  . Osteopenia   . Hyperlipemia   . Arthritis   . Kidney stones   . GERD (gastroesophageal reflux disease)   . Substance abuse     RECOVERING ALCOLHOLIC  . Alcoholism ([place])     HAS NOT HAD [place]  . Cataract   . 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 Hiatal hernia   . DDD (degenerative disc disease), lumbar   . Chronic back pain     Past Surgical History  Procedure Laterality Date  . Lumbar disc surgery  2008  . Facial cosmetic surgery      eyes, nose  . Abdominoplasty    . Liposuction  15 to 20 yrs ago  . Surgery for kidney stones    . Right index finger surgery  06/19/11    AT [facility]  . Laparoscopic nissen fundoplication  0/97/3532    Procedure: LAPAROSCOPIC NISSEN FUNDOPLICATION;  Surgeon: Pedro Earls, MD;  [facility];  Service: General;  Laterality: N/A;  Laparoscopic Nissen repair of hiatal hernia  with lighted bougie  . Esophageal manometry  02/17/2012    Procedure: ESOPHAGEAL MANOMETRY (EM);  Surgeon: Pedro Earls, MD;  [facility];  Service: Endoscopy;  Laterality: N/A;  . Dilation and curettage of uterus  many yrs ago    x 2  . Total knee arthroplasty Left 08/10/2012    Procedure:  TOTAL KNEE ARTHROPLASTY;  Surgeon: Mauri Pole, MD;  [facility];  Service: Orthopedics;  Laterality: Left;  . Joint replacement      left  . Open surgical repair of gluteal tendon Right 12/28/2012    Procedure: RIGHT GLUTEAL TENDON REPAIR;  Surgeon: Mauri Pole, MD;  [facility];  Service: Orthopedics;  Laterality: Right;    There were no vitals filed for this visit.  Visit Diagnosis:  Midline low back pain without sciatica  Difficulty walking  Weakness of both hips  Foot drop, left      Subjective Assessment - 08/11/15 1146    Subjective Patient reports that she has used the AFO and likes it, she reports feeling a little safer, she bought a rollator walker.   Currently in Pain? Yes   Pain Score 3    Pain Location Back                         [facility] Adult PT Treatment/Exercise - 08/11/15 0001    High Level Balance   High Level Balance Activities Side stepping;Backward walking;Marching forwards;Tandem walking   Lumbar Exercises: Aerobic   Elliptical R=5, I=5 x 3 minutes   UBE (Upper Arm Bike) NuStep Level  5 x 6 minutes, did not use arms today   Knee/Hip Exercises: Stretches   Passive Hamstring Stretch 3 reps;20 seconds   Knee/Hip Exercises: Machines for Strengthening   Cybex Knee Extension 10# 2x15   Cybex Knee Flexion 35# 2x15   Cybex Leg Press 70# both legs 2x15, then single legs with 20#   Hip Cybex 8" step ups   Knee/Hip Exercises: Supine   Other Supine Knee/Hip Exercises hip IR/ER 2 sets 10 yellow tband    Knee/Hip Exercises: Sidelying   Clams 2x15 each side   Other Sidelying Knee/Hip Exercises bridges then bridges with single leg, opposite leg up on PT shoulder to limit its use   Moist Heat Therapy   Number Minutes Moist Heat 15 Minutes   Moist Heat Location Lumbar Spine   Electrical Stimulation   Electrical Stimulation Location low back   Electrical Stimulation Action IFC   Electrical Stimulation Parameters supine   Electrical  Stimulation Goals Pain                  PT Short Term Goals - 07/28/15 1134    PT SHORT TERM GOAL #1   Title independent with initial HEP   Status Achieved           PT Long Term Goals - 08/09/15 1155    PT LONG TERM GOAL #1   Title decrease Tug time to 15 seconds   Status On-going   PT LONG TERM GOAL #2   Title decrease pain 50%   Status On-going   PT LONG TERM GOAL #3   Title walk with a SPC x 200 feet   Status Partially Met   PT LONG TERM GOAL #4   Title increase right hip abduction to 15 degrees   Status On-going               Plan - 08/11/15 1154    Clinical Impression Statement AFO seems to help her, she bought and new rollator and I needed to adjust it to make it higher to fit her better for better posture.  She had better abduction today when doing supine hip abduction with sliding board under the heels   PT Next Visit Plan Continue to work on safety and fall prevention with AFO, as well as trying to increase functional strength of the hips   Consulted and Agree with Plan of Care Patient        Problem List Patient Active Problem List   Diagnosis Date Noted  . Postoperative anemia due to acute blood loss 12/30/2014  . Essential hypertension, benign 07/04/2014  . Accelerated hypertension 05/05/2014  . Wheezing 03/30/2014  . Pulmonary embolism ([place]) 10/14/2013  . Acute respiratory failure ([place]) 10/14/2013  . Chest pain 10/14/2013  . Anemia 10/14/2013  . PE (pulmonary embolism) 10/14/2013  . Urinary frequency 04/07/2013  . Urinary incontinence 04/07/2013  . Right gluteus tear 12/28/2012  . Generalized anxiety disorder 08/19/2012  . Unspecified constipation 08/19/2012  . GERD (gastroesophageal reflux disease) 08/19/2012  . Overweight (BMI 25.0-29.9) 08/11/2012  . Expected blood loss anemia 08/11/2012  . Knee pain, left 03/31/2012  . Depression 09/10/2011  . S/P Nissen fundoplication (without gastrostomy tube) procedure 07/26/2011  .  Chronic cough 01/09/2011  . RESTLESS LEG SYNDROME 05/05/2008  . HYPERLIPIDEMIA 09/14/2007  . Osteopenia 09/14/2007    Sumner Boast., PT 08/11/2015, 11:57 AM  [address] [place], [place], [address] Phone: [phone]  Fax:  [phone]  Name: ERLINE SIDDOWAY MRN: [id number] Date of Birth: [birth year]

## 2015-08-14 ENCOUNTER — Encounter: Payer: Self-pay | Admitting: Physical Therapy

## 2015-08-14 ENCOUNTER — Ambulatory Visit: Payer: Medicare Other | Admitting: Physical Therapy

## 2015-08-14 DIAGNOSIS — R262 Difficulty in walking, not elsewhere classified: Secondary | ICD-10-CM

## 2015-08-14 DIAGNOSIS — M21372 Foot drop, left foot: Secondary | ICD-10-CM

## 2015-08-14 DIAGNOSIS — M545 Low back pain, unspecified: Secondary | ICD-10-CM

## 2015-08-14 DIAGNOSIS — M6289 Other specified disorders of muscle: Secondary | ICD-10-CM | POA: Diagnosis not present

## 2015-08-14 DIAGNOSIS — R29898 Other symptoms and signs involving the musculoskeletal system: Secondary | ICD-10-CM

## 2015-08-14 NOTE — Therapy (Signed)
[place], [place], [address] Phone: [phone]   Fax:  [phone]  Physical Therapy Treatment  Patient Details  Name: Felicia Cruz MRN: [id number] Date of Birth: 27-Aug-1940 Referring Provider: Harold Hedge  Encounter Date: 08/14/2015      PT End of Session - 08/14/15 1314    Visit Number 10   Date for PT Re-Evaluation 09/08/15   PT Start Time 0923   PT Stop Time 1015   PT Time Calculation (min) 52 min   Activity Tolerance Patient tolerated treatment well   Behavior During Therapy [facility] for tasks assessed/performed      Past Medical History  Diagnosis Date  . RLS (restless legs syndrome)   . Esophageal stricture 2010  . Osteopenia   . Hyperlipemia   . Arthritis   . Kidney stones   . GERD (gastroesophageal reflux disease)   . Substance abuse     RECOVERING ALCOLHOLIC  . Alcoholism ([place])     HAS NOT HAD [place]  . Cataract   . 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 Hiatal hernia   . DDD (degenerative disc disease), lumbar   . Chronic back pain     Past Surgical History  Procedure Laterality Date  . Lumbar disc surgery  2008  . Facial cosmetic surgery      eyes, nose  . Abdominoplasty    . Liposuction  15 to 20 yrs ago  . Surgery for kidney stones    . Right index finger surgery  06/19/11    AT [facility]  . Laparoscopic nissen fundoplication  6/50/3546    Procedure: LAPAROSCOPIC NISSEN FUNDOPLICATION;  Surgeon: Pedro Earls, MD;  [facility];  Service: General;  Laterality: N/A;  Laparoscopic Nissen repair of hiatal hernia  with lighted bougie  . Esophageal manometry  02/17/2012    Procedure: ESOPHAGEAL MANOMETRY (EM);  Surgeon: Pedro Earls, MD;  [facility];  Service: Endoscopy;  Laterality: N/A;  . Dilation and curettage of uterus  many yrs ago    x 2  . 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 Joint replacement      left  . Open surgical repair of gluteal tendon Right 12/28/2012    Procedure: RIGHT GLUTEAL TENDON REPAIR;  Surgeon: Mauri Pole, MD;  [facility];  Service: Orthopedics;  Laterality: Right;    There were no vitals filed for this visit.  Visit Diagnosis:  Midline low back pain without sciatica  Difficulty walking  Weakness of both hips  Foot drop, left      Subjective Assessment - 08/14/15 0930    Subjective Continues to report that the AFO helps and that she thinks it will reduce her catching her toe.  No falls over the weekend   Currently in Pain? Yes   Pain Score 2    Pain Location Back   Pain Orientation Lower                         [facility] Adult PT Treatment/Exercise - 08/14/15 0001    High Level Balance   High Level Balance Activities Side stepping;Backward walking;Marching forwards;Tandem walking   Lumbar Exercises: Aerobic   Elliptical R=6, I=10 x 3 minutes   UBE (  Upper Arm Bike) NuStep Level 5 x 6 minutes, did not use arms today   Knee/Hip Exercises: Stretches   Lobbyist 3 reps;30 seconds   Hip Flexor Stretch 3 reps;20 seconds   Other Knee/Hip Stretches IR and adductor stretches   Knee/Hip Exercises: Machines for Strengthening   Cybex Knee Extension 10# 2x15   Cybex Knee Flexion 35# 2x15   Cybex Leg Press 70# both legs 2x15, then single legs with 20#   Hip Cybex 8" step ups   Knee/Hip Exercises: Supine   Other Supine Knee/Hip Exercises hip IR/ER 2 sets 10 yellow tband    Knee/Hip Exercises: Sidelying   Clams 2x15 each side   Other Sidelying Knee/Hip Exercises bridges then bridges with single leg, opposite leg up on PT shoulder to limit its use   Ankle Exercises: Seated   Other Seated Ankle Exercises red tband ankle DF                  PT Short Term Goals - 07/28/15 1134     PT SHORT TERM GOAL #1   Title independent with initial HEP   Status Achieved           PT Long Term Goals - 08/09/15 1155    PT LONG TERM GOAL #1   Title decrease Tug time to 15 seconds   Status On-going   PT LONG TERM GOAL #2   Title decrease pain 50%   Status On-going   PT LONG TERM GOAL #3   Title walk with a SPC x 200 feet   Status Partially Met   PT LONG TERM GOAL #4   Title increase right hip abduction to 15 degrees   Status On-going               Plan - 09/12/2015 1317    Clinical Impression Statement Without a device to walk with her gait is very poor with inability to control trendelenberg sway.   PT Next Visit Plan Continue to work on safety and fall prevention with AFO, as well as trying to increase functional strength of the hips   Consulted and Agree with Plan of Care Patient          G-Codes - 09-12-15 1318    Functional Assessment Tool Used Foto 55% limitation   Functional Limitation Mobility: Walking and moving around   Mobility: Walking and Moving Around Current Status [phone]) At least 40 percent but less than 60 percent impaired, limited or restricted   Mobility: Walking and Moving Around Goal Status [phone]) At least 40 percent but less than 60 percent impaired, limited or restricted      Problem List Patient Active Problem List   Diagnosis Date Noted  . Postoperative anemia due to acute blood loss 12/30/2014  . Essential hypertension, benign 07/04/2014  . Accelerated hypertension 05/05/2014  . Wheezing 03/30/2014  . Pulmonary embolism ([facility]) 10/14/2013  . Acute respiratory failure ([facility]) 10/14/2013  . Chest pain 10/14/2013  . Anemia 10/14/2013  . PE (pulmonary embolism) 10/14/2013  . Urinary frequency 04/07/2013  . Urinary incontinence 04/07/2013  . Right gluteus tear 12/28/2012  . Generalized anxiety disorder 08/19/2012  . Unspecified constipation 08/19/2012  . GERD (gastroesophageal reflux disease) 08/19/2012  . Overweight (BMI  25.0-29.9) 08/11/2012  . Expected blood loss anemia 08/11/2012  . Knee pain, left 03/31/2012  . Depression 09/10/2011  . S/P Nissen fundoplication (without gastrostomy tube) procedure 07/26/2011  . Chronic cough 01/09/2011  . RESTLESS LEG SYNDROME 05/05/2008  .  HYPERLIPIDEMIA 09/14/2007  . Osteopenia 09/14/2007    Sumner Boast., PT 08/14/2015, 1:19 PM  [place] [address] [place], [place], [address] Phone: [phone]   Fax:  [phone]  Name: REKISHA WELLING MRN: [id number] Date of Birth: 1940-09-21

## 2015-08-15 ENCOUNTER — Ambulatory Visit (INDEPENDENT_AMBULATORY_CARE_PROVIDER_SITE_OTHER): Payer: Medicare Other | Admitting: Family Medicine

## 2015-08-15 ENCOUNTER — Ambulatory Visit (INDEPENDENT_AMBULATORY_CARE_PROVIDER_SITE_OTHER)
Admission: RE | Admit: 2015-08-15 | Discharge: 2015-08-15 | Disposition: A | Discharge status: Home / Self Care | Payer: Medicare Other | Source: Ambulatory Visit | Attending: Family Medicine | Admitting: Family Medicine

## 2015-08-15 ENCOUNTER — Other Ambulatory Visit: Payer: Self-pay | Admitting: Family Medicine

## 2015-08-15 ENCOUNTER — Encounter: Payer: Self-pay | Admitting: Family Medicine

## 2015-08-15 VITALS — BP 104/70 | Temp 97.3°F | Wt 183.0 lb

## 2015-08-15 DIAGNOSIS — I10 Essential (primary) hypertension: Secondary | ICD-10-CM | POA: Diagnosis not present

## 2015-08-15 DIAGNOSIS — M79604 Pain in right leg: Secondary | ICD-10-CM

## 2015-08-15 DIAGNOSIS — M79661 Pain in right lower leg: Secondary | ICD-10-CM | POA: Diagnosis not present

## 2015-08-15 DIAGNOSIS — M25561 Pain in right knee: Secondary | ICD-10-CM | POA: Diagnosis not present

## 2015-08-15 NOTE — Progress Notes (Signed)
   Subjective:    Patient ID: Felicia Cruz, female    DOB: 11/14/1940, 75 y.o.   MRN: [id number]  HPI [place] is a 75 year old widowed female nonsmoker who comes in today for evaluation of 2 problems  She has a history of hypertension his been on Hyzaar 100-12 0.5 daily and comes in today for follow-up. She says her blood pressures at home and been normal. She forgot to bring her cuff in her documentation. She says her systolics usually in the 123456 to 0000000 range diastolics in the 80 range. BP here today 104/70. She states she is not lightheaded when she stands up  She wants x-rays of her right leg. She said she fell at home in December on her right knee. Since that time her right leg is been sore. She describes as a crampy pain and its worst it to 6, sometimes pain comes and goes. It'll hurt for an hour to him go away sometimes it hurts all day long. It does not bother her sleep. The pain is decreased by the pain medication which is tramadol 50 mg 2-3 times daily but she doesn't want to "live on pain pills"  The pain is increased by walking and decreased by sitting or lying down. She now uses a 4. walker, she can ambulate with a cane alone.  She's due to go back to see your musculoskeletal folks at [facility] the 28th of this month   Review of Systems    negative Objective:   Physical Exam  Well-developed well-nourished female no acute distress vital signs stable she's afebrile examination right lower extremity shows some palpable tenderness in the thigh and the calf no edema. Ankle is normal. She points to the tib-fib area knee and the right femur as a source for pain. Pulses normal no edema      Assessment & Plan:  Right knee lower extremity and upper extremity pain for the past 3 months secondary to fall at home.......... unresolved with conservative therapy.........Marland Kitchen begin workup with x-rays  Hypertension at goal......... continue current therapy

## 2015-08-15 NOTE — Progress Notes (Signed)
Pre visit review using our clinic review tool, if applicable. No additional management support is needed unless otherwise documented below in the visit note. 

## 2015-08-15 NOTE — Patient Instructions (Addendum)
Go to the main office on [address] for your x-rays today. I will call you the report ASAP  Continue current blood pressure medication  Check your blood pressure Monday Wednesday Friday be sure it stays normal.............Marland Kitchen goal 140/90 or less

## 2015-08-18 ENCOUNTER — Ambulatory Visit: Payer: Medicare Other | Admitting: Physical Therapy

## 2015-08-18 ENCOUNTER — Encounter: Payer: Self-pay | Admitting: Physical Therapy

## 2015-08-18 DIAGNOSIS — R29898 Other symptoms and signs involving the musculoskeletal system: Secondary | ICD-10-CM

## 2015-08-18 DIAGNOSIS — R262 Difficulty in walking, not elsewhere classified: Secondary | ICD-10-CM

## 2015-08-18 DIAGNOSIS — M545 Low back pain, unspecified: Secondary | ICD-10-CM

## 2015-08-18 DIAGNOSIS — M6289 Other specified disorders of muscle: Secondary | ICD-10-CM | POA: Diagnosis not present

## 2015-08-18 DIAGNOSIS — M21372 Foot drop, left foot: Secondary | ICD-10-CM

## 2015-08-18 NOTE — Therapy (Signed)
[address] [place], [place], [address] Phone: [phone]   Fax:  [phone]  Physical Therapy Treatment  Patient Details  Name: Felicia Cruz MRN: [id number] Date of Birth: [birth year] Referring Provider: Harold Hedge  Encounter Date: 08/18/2015      PT End of Session - 08/18/15 1130    Visit Number 11   Date for PT Re-Evaluation 09/08/15   PT Start Time 1045   PT Stop Time 1130   PT Time Calculation (min) 45 min   Activity Tolerance Patient tolerated treatment well   Behavior During Therapy [facility] for tasks assessed/performed      Past Medical History  Diagnosis Date  . RLS (restless legs syndrome)   . Esophageal stricture 2010  . Osteopenia   . Hyperlipemia   . Arthritis   . Kidney stones   . GERD (gastroesophageal reflux disease)   . Substance abuse     RECOVERING ALCOLHOLIC  . Alcoholism ([place])     HAS NOT HAD [place]  . Cataract   . 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 Hiatal hernia   . DDD (degenerative disc disease), lumbar   . Chronic back pain     Past Surgical History  Procedure Laterality Date  . Lumbar disc surgery  2008  . Facial cosmetic surgery      eyes, nose  . Abdominoplasty    . Liposuction  15 to 20 yrs ago  . Surgery for kidney stones    . Right index finger surgery  06/19/11    AT [facility]  . Laparoscopic nissen fundoplication  8/92/1194    Procedure: LAPAROSCOPIC NISSEN FUNDOPLICATION;  Surgeon: Pedro Earls, MD;  [facility];  Service: General;  Laterality: N/A;  Laparoscopic Nissen repair of hiatal hernia  with lighted bougie  . Esophageal manometry  02/17/2012    Procedure: ESOPHAGEAL MANOMETRY (EM);  Surgeon: Pedro Earls, MD;  [facility];  Service: Endoscopy;  Laterality: N/A;  . Dilation and curettage of uterus  many yrs ago    x 2  . 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 Joint replacement      left  . Open surgical repair of gluteal tendon Right 12/28/2012    Procedure: RIGHT GLUTEAL TENDON REPAIR;  Surgeon: Mauri Pole, MD;  [facility];  Service: Orthopedics;  Laterality: Right;    There were no vitals filed for this visit.  Visit Diagnosis:  Midline low back pain without sciatica  Difficulty walking  Weakness of both hips  Foot drop, left      Subjective Assessment - 08/18/15 1049    Subjective Had recent x-rays of the hip and knee, they show some mild degenerative changes and spurring.  she reports that she is walkiing better no using the FWW and AFO on the left   Currently in Pain? Yes   Pain Score 2    Pain Location Back   Pain Orientation Lower   Pain Descriptors / Indicators Aching   Aggravating Factors  standing or sitting too long   Pain Relieving Factors treatment, rest            [facility] PT Assessment - 08/18/15 0001    Timed Up and Go Test   Normal TUG (seconds) 16  [place] Adult PT Treatment/Exercise - 08/18/15 0001    Ambulation/Gait   Gait Comments worked on gait with the AFO and with a lot of cues to decrase the trendelenberg style gait   High Level Balance   High Level Balance Activities Side stepping;Backward walking;Marching forwards;Tandem walking   Lumbar Exercises: Aerobic   Elliptical R=6, I=10 x 4 minutes   UBE (Upper Arm Bike) NuStep Level 6 x 5 minutes, did not use arms today   Knee/Hip Exercises: Stretches   Sports administrator 3 reps;30 seconds   Hip Flexor Stretch 3 reps;20 seconds   Knee/Hip Exercises: Machines for Strengthening   Cybex Knee Extension 10# 2x15   Cybex Knee Flexion 35# 2x15   Cybex Leg Press 70# both legs 2x15, then single legs with 20#   Hip Cybex 8" step ups   Other Machine tanding 3# hip abduction with verbal and tactile  cues to decrease trunk lean as well as to keep foot forward to not compensate   Knee/Hip Exercises: Supine   Other Supine Knee/Hip Exercises hip IR/ER 2 sets 10 yellow tband    Ankle Exercises: Seated   Other Seated Ankle Exercises red tband ankle DF                  PT Short Term Goals - 07/28/15 1134    PT SHORT TERM GOAL #1   Title independent with initial HEP   Status Achieved           PT Long Term Goals - 08/18/15 1134    PT LONG TERM GOAL #1   Title decrease Tug time to 15 seconds   Status On-going   PT LONG TERM GOAL #2   Title decrease pain 50%   Status Partially Met   PT LONG TERM GOAL #3   Title walk with a SPC x 200 feet   Status Achieved   PT LONG TERM GOAL #4   Title increase right hip abduction to 15 degrees   Status On-going               Plan - 08/18/15 1131    Clinical Impression Statement She is overall much safer with the RW and the AFO with her gait.  She has difficulty with the hip abduction and needs continueous cues to decresae the compensations   PT Next Visit Plan Continue to work on safety and fall prevention with AFO, as well as trying to increase functional strength of the hips   Consulted and Agree with Plan of Care Patient        Problem List Patient Active Problem List   Diagnosis Date Noted  . Right leg pain 08/15/2015  . Postoperative anemia due to acute blood loss 12/30/2014  . Essential hypertension, benign 07/04/2014  . Accelerated hypertension 05/05/2014  . Wheezing 03/30/2014  . Pulmonary embolism ([place]) 10/14/2013  . Acute respiratory failure ([place]) 10/14/2013  . Chest pain 10/14/2013  . Anemia 10/14/2013  . PE (pulmonary embolism) 10/14/2013  . Urinary frequency 04/07/2013  . Urinary incontinence 04/07/2013  . Right gluteus tear 12/28/2012  . Generalized anxiety disorder 08/19/2012  . Unspecified constipation 08/19/2012  . GERD (gastroesophageal reflux disease) 08/19/2012  . Overweight (BMI  25.0-29.9) 08/11/2012  . Expected blood loss anemia 08/11/2012  . Knee pain, left 03/31/2012  . Depression 09/10/2011  . S/P Nissen fundoplication (without gastrostomy tube) procedure 07/26/2011  . Chronic cough 01/09/2011  . RESTLESS LEG SYNDROME 05/05/2008  . HYPERLIPIDEMIA 09/14/2007  . Osteopenia 09/14/2007  Sumner Boast., PT 08/18/2015, 11:35 AM  [address] [place], [place], [address] Phone: [phone]   Fax:  [phone]  Name: Felicia Cruz MRN: [id number] Date of Birth: 11-19-1940

## 2015-08-21 ENCOUNTER — Encounter: Payer: Self-pay | Admitting: Physical Therapy

## 2015-08-21 ENCOUNTER — Ambulatory Visit: Payer: Medicare Other | Admitting: Physical Therapy

## 2015-08-21 DIAGNOSIS — R262 Difficulty in walking, not elsewhere classified: Secondary | ICD-10-CM | POA: Diagnosis not present

## 2015-08-21 DIAGNOSIS — R29898 Other symptoms and signs involving the musculoskeletal system: Secondary | ICD-10-CM

## 2015-08-21 DIAGNOSIS — M21372 Foot drop, left foot: Secondary | ICD-10-CM

## 2015-08-21 DIAGNOSIS — M6289 Other specified disorders of muscle: Secondary | ICD-10-CM | POA: Diagnosis not present

## 2015-08-21 DIAGNOSIS — M545 Low back pain, unspecified: Secondary | ICD-10-CM

## 2015-08-21 NOTE — Therapy (Signed)
[facility] [address] [place], [place], [address] Phone: [phone]   Fax:  [phone]  Physical Therapy Treatment  Patient Details  Name: Felicia Cruz MRN: [id number] Date of Birth: Dec 21, 1940 Referring Provider: Arloa Koh  Encounter Date: 08/21/2015      PT End of Session - 08/21/15 0834    Visit Number 12   Date for PT Re-Evaluation 09/08/15   PT Start Time 0750   PT Stop Time 0838   PT Time Calculation (min) 48 min   Activity Tolerance Patient tolerated treatment well   Behavior During Therapy [facility] for tasks assessed/performed      Past Medical History  Diagnosis Date  . RLS (restless legs syndrome)   . Esophageal stricture 2010  . Osteopenia   . Hyperlipemia   . Arthritis   . Kidney stones   . GERD (gastroesophageal reflux disease)   . Substance abuse     RECOVERING ALCOLHOLIC  . Alcoholism ([facility])     HAS NOT HAD DRINK SINCE 1995  . Cataract   . 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 Hiatal hernia   . DDD (degenerative disc disease), lumbar   . Chronic back pain     Past Surgical History  Procedure Laterality Date  . Lumbar disc surgery  2008  . Facial cosmetic surgery      eyes, nose  . Abdominoplasty    . Liposuction  15 to 20 yrs ago  . Surgery for kidney stones    . Right index finger surgery  06/19/11    AT [facility]  . Laparoscopic nissen fundoplication  06/26/2011    Procedure: LAPAROSCOPIC NISSEN FUNDOPLICATION;  Surgeon: Valarie Merino, MD;  [facility];  Service: General;  Laterality: N/A;  Laparoscopic Nissen repair of hiatal hernia  with lighted bougie  . Esophageal manometry  02/17/2012    Procedure: ESOPHAGEAL MANOMETRY (EM);  Surgeon: Valarie Merino, MD;  [facility];  Service: Endoscopy;  Laterality: N/A;  . Dilation and curettage of uterus  many yrs ago    x 2  . Total knee arthroplasty Left 08/10/2012     Procedure: TOTAL KNEE ARTHROPLASTY;  Surgeon: Shelda Pal, MD;  [facility];  Service: Orthopedics;  Laterality: Left;  . Joint replacement      left  . Open surgical repair of gluteal tendon Right 12/28/2012    Procedure: RIGHT GLUTEAL TENDON REPAIR;  Surgeon: Shelda Pal, MD;  [facility];  Service: Orthopedics;  Laterality: Right;    There were no vitals filed for this visit.  Visit Diagnosis:  Midline low back pain without sciatica  Difficulty walking  Weakness of both hips  Foot drop, left      Subjective Assessment - 08/21/15 0759    Subjective Reports doing well over the weekend.  No issues.   Currently in Pain? Yes   Pain Score 2    Pain Location Back                         [facility] Adult PT Treatment/Exercise - 08/21/15 0001    Ambulation/Gait   Gait Comments worked on gait with the AFO and with a lot of cues to decrase the trendelenberg style gait   High Level Balance   High Level Balance Activities Side stepping;Backward walking;Marching forwards;Tandem walking   Lumbar Exercises: Aerobic   Elliptical R=6,  I=10 x 4 minutes   Knee/Hip Exercises: Stretches   Sports administrator 3 reps;30 seconds   Hip Flexor Stretch 3 reps;20 seconds   Knee/Hip Exercises: Machines for Strengthening   Cybex Knee Extension 10# 2x15   Cybex Knee Flexion 35# 2x15   Cybex Leg Press 70# both legs 2x15, then single legs with 20#   Hip Cybex 8" step ups   Other Machine standing 3# hip abduction with verbal and tactile cues to decrease trunk lean as well as to keep foot forward to not compensate   Knee/Hip Exercises: Sidelying   Clams 2x15 each side   Other Sidelying Knee/Hip Exercises bridges then bridges with single leg, opposite leg up on PT shoulder to limit its use                  PT Short Term Goals - 07/28/15 1134    PT SHORT TERM GOAL #1   Title independent with initial HEP   Status Achieved           PT Long Term Goals - 08/18/15  1134    PT LONG TERM GOAL #1   Title decrease Tug time to 15 seconds   Status On-going   PT LONG TERM GOAL #2   Title decrease pain 50%   Status Partially Met   PT LONG TERM GOAL #3   Title walk with a SPC x 200 feet   Status Achieved   PT LONG TERM GOAL #4   Title increase right hip abduction to 15 degrees   Status On-going               Plan - 08/21/15 0835    Clinical Impression Statement Still struggling with the weakness of the hips, the AFO is helping but the gait is still very poor with the trendelenberg gait pattern.   PT Next Visit Plan Continue to work on safety and fall prevention with AFO, as well as trying to increase functional strength of the hips   Consulted and Agree with Plan of Care Patient        Problem List Patient Active Problem List   Diagnosis Date Noted  . Right leg pain 08/15/2015  . Postoperative anemia due to acute blood loss 12/30/2014  . Essential hypertension, benign 07/04/2014  . Accelerated hypertension 05/05/2014  . Wheezing 03/30/2014  . Pulmonary embolism ([place]) 10/14/2013  . Acute respiratory failure ([place]) 10/14/2013  . Chest pain 10/14/2013  . Anemia 10/14/2013  . PE (pulmonary embolism) 10/14/2013  . Urinary frequency 04/07/2013  . Urinary incontinence 04/07/2013  . Right gluteus tear 12/28/2012  . Generalized anxiety disorder 08/19/2012  . Unspecified constipation 08/19/2012  . GERD (gastroesophageal reflux disease) 08/19/2012  . Overweight (BMI 25.0-29.9) 08/11/2012  . Expected blood loss anemia 08/11/2012  . Knee pain, left 03/31/2012  . Depression 09/10/2011  . S/P Nissen fundoplication (without gastrostomy tube) procedure 07/26/2011  . Chronic cough 01/09/2011  . RESTLESS LEG SYNDROME 05/05/2008  . HYPERLIPIDEMIA 09/14/2007  . Osteopenia 09/14/2007    Sumner Boast., PT 08/21/2015, 8:37 AM  [address] [place], [place],  [address] Phone: [phone]   Fax:  [phone]  Name: Felicia Cruz MRN: [id number] Date of Birth: 03/26/1941

## 2015-08-28 ENCOUNTER — Ambulatory Visit: Payer: Medicare Other | Admitting: Physical Therapy

## 2015-08-28 ENCOUNTER — Encounter: Payer: Self-pay | Admitting: Physical Therapy

## 2015-08-28 DIAGNOSIS — R29898 Other symptoms and signs involving the musculoskeletal system: Secondary | ICD-10-CM

## 2015-08-28 DIAGNOSIS — R262 Difficulty in walking, not elsewhere classified: Secondary | ICD-10-CM

## 2015-08-28 DIAGNOSIS — M545 Low back pain, unspecified: Secondary | ICD-10-CM

## 2015-08-28 DIAGNOSIS — M21372 Foot drop, left foot: Secondary | ICD-10-CM

## 2015-08-28 DIAGNOSIS — M6289 Other specified disorders of muscle: Secondary | ICD-10-CM | POA: Diagnosis not present

## 2015-08-28 NOTE — Therapy (Signed)
[address] [place], [place], [address] Phone: [phone]   Fax:  [phone]  Physical Therapy Treatment  Patient Details  Name: Felicia Cruz MRN: [id number] Date of Birth: 11/20/1940 Referring Provider: Harold Hedge  Encounter Date: 08/28/2015      PT End of Session - 08/28/15 1011    Visit Number 13   Date for PT Re-Evaluation 09/08/15   PT Start Time 0923   PT Stop Time 1014   PT Time Calculation (min) 51 min   Activity Tolerance Patient tolerated treatment well   Behavior During Therapy [facility] for tasks assessed/performed      Past Medical History  Diagnosis Date  . RLS (restless legs syndrome)   . Esophageal stricture 2010  . Osteopenia   . Hyperlipemia   . Arthritis   . Kidney stones   . GERD (gastroesophageal reflux disease)   . Substance abuse     RECOVERING ALCOLHOLIC  . Alcoholism ([place])     HAS NOT HAD [place]  . Cataract   . 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 Hiatal hernia   . DDD (degenerative disc disease), lumbar   . Chronic back pain     Past Surgical History  Procedure Laterality Date  . Lumbar disc surgery  2008  . Facial cosmetic surgery      eyes, nose  . Abdominoplasty    . Liposuction  15 to 20 yrs ago  . Surgery for kidney stones    . Right index finger surgery  06/19/11    AT [facility]  . Laparoscopic nissen fundoplication  4/70/9628    Procedure: LAPAROSCOPIC NISSEN FUNDOPLICATION;  Surgeon: Pedro Earls, MD;  [facility];  Service: General;  Laterality: N/A;  Laparoscopic Nissen repair of hiatal hernia  with lighted bougie  . Esophageal manometry  02/17/2012    Procedure: ESOPHAGEAL MANOMETRY (EM);  Surgeon: Pedro Earls, MD;  [facility];  Service: Endoscopy;  Laterality: N/A;  . Dilation and curettage of uterus  many yrs ago    x 2  . 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 Joint replacement      left  . Open surgical repair of gluteal tendon Right 12/28/2012    Procedure: RIGHT GLUTEAL TENDON REPAIR;  Surgeon: Mauri Pole, MD;  [facility];  Service: Orthopedics;  Laterality: Right;    There were no vitals filed for this visit.  Visit Diagnosis:  Midline low back pain without sciatica  Difficulty walking  Weakness of both hips  Foot drop, left      Subjective Assessment - 08/28/15 0929    Subjective I was able to go out a few times this past weekend and was not too tired and felt like I walked pretty good.   Currently in Pain? No/denies                         [facility] Adult PT Treatment/Exercise - 08/28/15 0001    High Level Balance   High Level Balance Activities Side stepping;Backward walking;Marching forwards;Tandem walking   Lumbar Exercises: Aerobic   Elliptical R=6, I=10 x 4 minutes   UBE (Upper Arm Bike) NuStep Level 6 x 5 minutes, did not use arms today  Knee/Hip Exercises: Stretches   Sports administrator 3 reps;30 seconds   Hip Flexor Stretch 3 reps;20 seconds   Knee/Hip Exercises: Machines for Strengthening   Cybex Knee Extension 10# 2x15   Cybex Knee Flexion 35# 2x15   Cybex Leg Press 70# both legs 2x15, then single legs with 20#   Hip Cybex 8" step ups   Other Machine standing 3# hip abduction with verbal and tactile cues to decrease trunk lean as well as to keep foot forward to not compensate   Knee/Hip Exercises: Sidelying   Other Sidelying Knee/Hip Exercises bridges then bridges with single leg, opposite leg up on PT shoulder to limit its use                  PT Short Term Goals - 07/28/15 1134    PT SHORT TERM GOAL #1   Title independent with initial HEP   Status Achieved           PT Long Term Goals - 08/18/15 1134    PT LONG TERM GOAL #1   Title decrease Tug  time to 15 seconds   Status On-going   PT LONG TERM GOAL #2   Title decrease pain 50%   Status Partially Met   PT LONG TERM GOAL #3   Title walk with a SPC x 200 feet   Status Achieved   PT LONG TERM GOAL #4   Title increase right hip abduction to 15 degrees   Status On-going               Plan - 08/28/15 1012    Clinical Impression Statement Able to do more with the hip, still will externally rotate the hip to compensate with hip flexor as the gluteus medius is weak   PT Next Visit Plan Continue to work on safety and fall prevention with AFO, as well as trying to increase functional strength of the hips   Consulted and Agree with Plan of Care Patient        Problem List Patient Active Problem List   Diagnosis Date Noted  . Right leg pain 08/15/2015  . Postoperative anemia due to acute blood loss 12/30/2014  . Essential hypertension, benign 07/04/2014  . Accelerated hypertension 05/05/2014  . Wheezing 03/30/2014  . Pulmonary embolism ([place]) 10/14/2013  . Acute respiratory failure ([place]) 10/14/2013  . Chest pain 10/14/2013  . Anemia 10/14/2013  . PE (pulmonary embolism) 10/14/2013  . Urinary frequency 04/07/2013  . Urinary incontinence 04/07/2013  . Right gluteus tear 12/28/2012  . Generalized anxiety disorder 08/19/2012  . Unspecified constipation 08/19/2012  . GERD (gastroesophageal reflux disease) 08/19/2012  . Overweight (BMI 25.0-29.9) 08/11/2012  . Expected blood loss anemia 08/11/2012  . Knee pain, left 03/31/2012  . Depression 09/10/2011  . S/P Nissen fundoplication (without gastrostomy tube) procedure 07/26/2011  . Chronic cough 01/09/2011  . RESTLESS LEG SYNDROME 05/05/2008  . HYPERLIPIDEMIA 09/14/2007  . Osteopenia 09/14/2007    Felicia Boast., PT 08/28/2015, 10:14 AM  [address] [place], [place], [address] Phone: [phone]   Fax:  [phone]  Name: Felicia Cruz MRN: [id number] Date of Birth: 02/19/1941

## 2015-08-29 DIAGNOSIS — R260 Ataxic gait: Secondary | ICD-10-CM | POA: Diagnosis not present

## 2015-08-29 DIAGNOSIS — G629 Polyneuropathy, unspecified: Secondary | ICD-10-CM | POA: Diagnosis not present

## 2015-08-29 DIAGNOSIS — R5382 Chronic fatigue, unspecified: Secondary | ICD-10-CM | POA: Diagnosis not present

## 2015-08-31 ENCOUNTER — Encounter: Payer: Self-pay | Admitting: Physical Therapy

## 2015-08-31 ENCOUNTER — Ambulatory Visit: Payer: Medicare Other | Admitting: Physical Therapy

## 2015-08-31 DIAGNOSIS — M545 Low back pain, unspecified: Secondary | ICD-10-CM

## 2015-08-31 DIAGNOSIS — R262 Difficulty in walking, not elsewhere classified: Secondary | ICD-10-CM | POA: Diagnosis not present

## 2015-08-31 DIAGNOSIS — R29898 Other symptoms and signs involving the musculoskeletal system: Secondary | ICD-10-CM

## 2015-08-31 DIAGNOSIS — M21372 Foot drop, left foot: Secondary | ICD-10-CM | POA: Diagnosis not present

## 2015-08-31 DIAGNOSIS — M6289 Other specified disorders of muscle: Secondary | ICD-10-CM | POA: Diagnosis not present

## 2015-08-31 NOTE — Therapy (Signed)
[place] [address] [place], [place], [address] Phone: [phone]   Fax:  [phone]  Physical Therapy Treatment  Patient Details  Name: Felicia Cruz MRN: [id number] Date of Birth: November 22, 1940 Referring Provider: Harold Hedge  Encounter Date: 08/31/2015      PT End of Session - 08/31/15 1331    Visit Number 14   Date for PT Re-Evaluation 09/08/15   PT Start Time [id number]   PT Stop Time 1347   PT Time Calculation (min) 49 min   Activity Tolerance Patient tolerated treatment well   Behavior During Therapy [facility] for tasks assessed/performed      Past Medical History  Diagnosis Date  . RLS (restless legs syndrome)   . Esophageal stricture 2010  . Osteopenia   . Hyperlipemia   . Arthritis   . Kidney stones   . GERD (gastroesophageal reflux disease)   . Substance abuse     RECOVERING ALCOLHOLIC  . Alcoholism ([place])     HAS NOT HAD [place]  . Cataract   . 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 Hiatal hernia   . DDD (degenerative disc disease), lumbar   . Chronic back pain     Past Surgical History  Procedure Laterality Date  . Lumbar disc surgery  2008  . Facial cosmetic surgery      eyes, nose  . Abdominoplasty    . Liposuction  15 to 20 yrs ago  . Surgery for kidney stones    . Right index finger surgery  06/19/11    AT [facility]  . Laparoscopic nissen fundoplication  [id number]    Procedure: LAPAROSCOPIC NISSEN FUNDOPLICATION;  Surgeon: Pedro Earls, MD;  [facility];  Service: General;  Laterality: N/A;  Laparoscopic Nissen repair of hiatal hernia  with lighted bougie  . Esophageal manometry  02/17/2012    Procedure: ESOPHAGEAL MANOMETRY (EM);  Surgeon: Pedro Earls, MD;  [facility];  Service: Endoscopy;  Laterality: N/A;  . Dilation and curettage of uterus  many yrs ago    x 2  . Total knee arthroplasty Left 08/10/2012     Procedure: TOTAL KNEE ARTHROPLASTY;  Surgeon: Mauri Pole, MD;  [facility];  Service: Orthopedics;  Laterality: Left;  . Joint replacement      left  . Open surgical repair of gluteal tendon Right 12/28/2012    Procedure: RIGHT GLUTEAL TENDON REPAIR;  Surgeon: Mauri Pole, MD;  [facility];  Service: Orthopedics;  Laterality: Right;    There were no vitals filed for this visit.  Visit Diagnosis:  Midline low back pain without sciatica  Difficulty walking  Weakness of both hips      Subjective Assessment - 08/31/15 1305    Subjective Patient reports that she saw the rehab MD yesterday and that he is not sure why she cannot walk better.  She reports that he is going to look at blood work and a possible new MRI, "he is unsure why my hip does not work"   Currently in Pain? No/denies            [facility] PT Assessment - 08/31/15 0001    Timed Up and Go Test   Normal TUG (seconds) 16                     [facility] Adult PT Treatment/Exercise - 08/31/15 0001  Lumbar Exercises: Aerobic   Elliptical R=6, I=10 x 4 minutes   UBE (Upper Arm Bike) NuStep Level 6 x 5 minutes, did not use arms today   Knee/Hip Exercises: Stretches   Sports administrator 3 reps;30 seconds   Hip Flexor Stretch 3 reps;20 seconds   Knee/Hip Exercises: Machines for Strengthening   Cybex Knee Extension 5# 2x10 single legs only   Cybex Knee Flexion 20# 2x10  single legs only   Cybex Leg Press 30# single legs only   Hip Cybex 8" step ups   Knee/Hip Exercises: Sidelying   Hip ABduction 3 sets;10 reps   Hip ABduction Limitations with assit of red tband for abduction   Clams 2x15 each side   Other Sidelying Knee/Hip Exercises bridges then bridges with single leg, opposite leg up on PT shoulder to limit its use                  PT Short Term Goals - 07/28/15 1134    PT SHORT TERM GOAL #1   Title independent with initial HEP   Status Achieved           PT Long Term Goals -  08/31/15 1340    PT LONG TERM GOAL #1   Title decrease Tug time to 15 seconds   Status On-going               Plan - 08/31/15 1333    Clinical Impression Statement Tried to really focus on single leg activities to decreased the assist and really get mm to work.  She was very worn out after this today.  One MD has said he does not know why the hip is not working and that we should continue to focus on the hip abduction strength, today at the end of session in left side lying she dould not hold right hip in abduction as she was too tired and weak.   PT Next Visit Plan continue to focus on single leg exercises to focus on the mms   Consulted and Agree with Plan of Care Patient        Problem List Patient Active Problem List   Diagnosis Date Noted  . Right leg pain 08/15/2015  . Postoperative anemia due to acute blood loss 12/30/2014  . Essential hypertension, benign 07/04/2014  . Accelerated hypertension 05/05/2014  . Wheezing 03/30/2014  . Pulmonary embolism ([place]) 10/14/2013  . Acute respiratory failure ([place]) 10/14/2013  . Chest pain 10/14/2013  . Anemia 10/14/2013  . PE (pulmonary embolism) 10/14/2013  . Urinary frequency 04/07/2013  . Urinary incontinence 04/07/2013  . Right gluteus tear 12/28/2012  . Generalized anxiety disorder 08/19/2012  . Unspecified constipation 08/19/2012  . GERD (gastroesophageal reflux disease) 08/19/2012  . Overweight (BMI 25.0-29.9) 08/11/2012  . Expected blood loss anemia 08/11/2012  . Knee pain, left 03/31/2012  . Depression 09/10/2011  . S/P Nissen fundoplication (without gastrostomy tube) procedure 07/26/2011  . Chronic cough 01/09/2011  . RESTLESS LEG SYNDROME 05/05/2008  . HYPERLIPIDEMIA 09/14/2007  . Osteopenia 09/14/2007    Sumner Boast., PT 08/31/2015, 1:41 PM  [place] [address] [place], [place], [address] Phone: [phone]   Fax:   [phone]  Name: LOKELANI FASICK MRN: [id number] Date of Birth: 06-06-1940

## 2015-09-05 ENCOUNTER — Ambulatory Visit: Payer: Medicare Other | Attending: Neurological Surgery | Admitting: Physical Therapy

## 2015-09-05 ENCOUNTER — Encounter: Payer: Self-pay | Admitting: Physical Therapy

## 2015-09-05 DIAGNOSIS — M6281 Muscle weakness (generalized): Secondary | ICD-10-CM | POA: Insufficient documentation

## 2015-09-05 DIAGNOSIS — M545 Low back pain, unspecified: Secondary | ICD-10-CM

## 2015-09-05 DIAGNOSIS — M6289 Other specified disorders of muscle: Secondary | ICD-10-CM | POA: Diagnosis not present

## 2015-09-05 DIAGNOSIS — R262 Difficulty in walking, not elsewhere classified: Secondary | ICD-10-CM | POA: Insufficient documentation

## 2015-09-05 DIAGNOSIS — M21372 Foot drop, left foot: Secondary | ICD-10-CM | POA: Diagnosis not present

## 2015-09-05 NOTE — Therapy (Signed)
[place], [place], [address] Phone: [phone]   Fax:  [phone]  Physical Therapy Treatment  Patient Details  Name: Felicia Cruz MRN: [id number] Date of Birth: 12-16-1940 Referring Provider: Harold Hedge  Encounter Date: 09/05/2015      PT End of Session - 09/05/15 1425    Visit Number 15   Date for PT Re-Evaluation 09/08/15   PT Start Time 1350   PT Stop Time 1435   PT Time Calculation (min) 45 min   Activity Tolerance Patient tolerated treatment well   Behavior During Therapy [facility] for tasks assessed/performed      Past Medical History  Diagnosis Date  . RLS (restless legs syndrome)   . Esophageal stricture 2010  . Osteopenia   . Hyperlipemia   . Arthritis   . Kidney stones   . GERD (gastroesophageal reflux disease)   . Substance abuse     RECOVERING ALCOLHOLIC  . Alcoholism ([place])     HAS NOT HAD [place]  . Cataract   . 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 Hiatal hernia   . DDD (degenerative disc disease), lumbar   . Chronic back pain     Past Surgical History  Procedure Laterality Date  . Lumbar disc surgery  2008  . Facial cosmetic surgery      eyes, nose  . Abdominoplasty    . Liposuction  15 to 20 yrs ago  . Surgery for kidney stones    . Right index finger surgery  06/19/11    AT [facility]  . Laparoscopic nissen fundoplication  [id number]    Procedure: LAPAROSCOPIC NISSEN FUNDOPLICATION;  Surgeon: Pedro Earls, MD;  [facility];  Service: General;  Laterality: N/A;  Laparoscopic Nissen repair of hiatal hernia  with lighted bougie  . Esophageal manometry  02/17/2012    Procedure: ESOPHAGEAL MANOMETRY (EM);  Surgeon: Pedro Earls, MD;  [facility];  Service: Endoscopy;  Laterality: N/A;  . Dilation and curettage of uterus  many yrs ago    x 2  . Total knee arthroplasty Left 08/10/2012    Procedure:  TOTAL KNEE ARTHROPLASTY;  Surgeon: Mauri Pole, MD;  [facility];  Service: Orthopedics;  Laterality: Left;  . Joint replacement      left  . Open surgical repair of gluteal tendon Right 12/28/2012    Procedure: RIGHT GLUTEAL TENDON REPAIR;  Surgeon: Mauri Pole, MD;  [facility];  Service: Orthopedics;  Laterality: Right;    There were no vitals filed for this visit.  Visit Diagnosis:  Midline low back pain without sciatica  Difficulty walking  Weakness of both hips      Subjective Assessment - 09/05/15 1355    Subjective I have been doing pretty good.  No falls.  Have not heard back from MD about blood work.   Currently in Pain? No/denies                         [facility] Adult PT Treatment/Exercise - 09/05/15 0001    Knee/Hip Exercises: Stretches   Quad Stretch 3 reps;30 seconds   Hip Flexor Stretch 3 reps;20 seconds   Other Knee/Hip Stretches IR and adductor stretches   Knee/Hip Exercises: Supine   Bridges with [employer] 20 reps   Bridges with Clamshell 20 reps   Single Leg Bridge 20  reps   Knee/Hip Exercises: Sidelying   Hip ABduction 3 sets;10 reps   Hip ABduction Limitations with assit of red tband for abduction   Clams 2x15 each side   Other Sidelying Knee/Hip Exercises prone hip extension with assist   Other Sidelying Knee/Hip Exercises bridges then bridges with single leg, opposite leg up on PT shoulder to limit its use                  PT Short Term Goals - 07/28/15 1134    PT SHORT TERM GOAL #1   Title independent with initial HEP   Status Achieved           PT Long Term Goals - 08/31/15 1340    PT LONG TERM GOAL #1   Title decrease Tug time to 15 seconds   Status On-going               Plan - 09/05/15 1425    Clinical Impression Statement Today we focused more on mat exercises, this really seemed to wear her out with hip strength and flexibility.  The hip flexor is very tight.   PT Next Visit Plan  continue to focus on the single leg exercises and may go to the mat exercises to really focus on this.  Write renewal   Consulted and Agree with Plan of Care Patient        Problem List Patient Active Problem List   Diagnosis Date Noted  . Right leg pain 08/15/2015  . Postoperative anemia due to acute blood loss 12/30/2014  . Essential hypertension, benign 07/04/2014  . Accelerated hypertension 05/05/2014  . Wheezing 03/30/2014  . Pulmonary embolism ([place]) 10/14/2013  . Acute respiratory failure ([place]) 10/14/2013  . Chest pain 10/14/2013  . Anemia 10/14/2013  . PE (pulmonary embolism) 10/14/2013  . Urinary frequency 04/07/2013  . Urinary incontinence 04/07/2013  . Right gluteus tear 12/28/2012  . Generalized anxiety disorder 08/19/2012  . Unspecified constipation 08/19/2012  . GERD (gastroesophageal reflux disease) 08/19/2012  . Overweight (BMI 25.0-29.9) 08/11/2012  . Expected blood loss anemia 08/11/2012  . Knee pain, left 03/31/2012  . Depression 09/10/2011  . S/P Nissen fundoplication (without gastrostomy tube) procedure 07/26/2011  . Chronic cough 01/09/2011  . RESTLESS LEG SYNDROME 05/05/2008  . HYPERLIPIDEMIA 09/14/2007  . Osteopenia 09/14/2007    Sumner Boast., PT 09/05/2015, 2:28 PM  [address] [place], [place], [address] Phone: [phone]   Fax:  [phone]  Name: Felicia Cruz MRN: [id number] Date of Birth: [birth year]

## 2015-09-07 ENCOUNTER — Encounter: Payer: Self-pay | Admitting: Physical Therapy

## 2015-09-07 ENCOUNTER — Ambulatory Visit: Payer: Medicare Other | Admitting: Physical Therapy

## 2015-09-07 DIAGNOSIS — M6281 Muscle weakness (generalized): Secondary | ICD-10-CM | POA: Diagnosis not present

## 2015-09-07 DIAGNOSIS — M21372 Foot drop, left foot: Secondary | ICD-10-CM

## 2015-09-07 DIAGNOSIS — R262 Difficulty in walking, not elsewhere classified: Secondary | ICD-10-CM | POA: Diagnosis not present

## 2015-09-07 DIAGNOSIS — M6289 Other specified disorders of muscle: Secondary | ICD-10-CM | POA: Diagnosis not present

## 2015-09-07 DIAGNOSIS — M545 Low back pain, unspecified: Secondary | ICD-10-CM

## 2015-09-07 NOTE — Therapy (Signed)
[place], [place], [address] Phone: [phone]   Fax:  [phone]  Physical Therapy Treatment  Patient Details  Name: Felicia Cruz MRN: [id number] Date of Birth: 10-17-1940 Referring Provider: Harold Hedge  Encounter Date: 09/07/2015      PT End of Session - 09/07/15 1321    Visit Number 16   Date for PT Re-Evaluation 10/07/15   PT Start Time 1300   PT Stop Time 1348   PT Time Calculation (min) 48 min   Activity Tolerance Patient tolerated treatment well   Behavior During Therapy [facility] for tasks assessed/performed      Past Medical History  Diagnosis Date  . RLS (restless legs syndrome)   . Esophageal stricture 2010  . Osteopenia   . Hyperlipemia   . Arthritis   . Kidney stones   . GERD (gastroesophageal reflux disease)   . Substance abuse     RECOVERING ALCOLHOLIC  . Alcoholism ([place])     HAS NOT HAD [place]  . Cataract   . 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 Hiatal hernia   . DDD (degenerative disc disease), lumbar   . Chronic back pain     Past Surgical History  Procedure Laterality Date  . Lumbar disc surgery  2008  . Facial cosmetic surgery      eyes, nose  . Abdominoplasty    . Liposuction  15 to 20 yrs ago  . Surgery for kidney stones    . Right index finger surgery  06/19/11    AT [facility]  . Laparoscopic nissen fundoplication  [id number]    Procedure: LAPAROSCOPIC NISSEN FUNDOPLICATION;  Surgeon: Pedro Earls, MD;  [facility];  Service: General;  Laterality: N/A;  Laparoscopic Nissen repair of hiatal hernia  with lighted bougie  . Esophageal manometry  02/17/2012    Procedure: ESOPHAGEAL MANOMETRY (EM);  Surgeon: Pedro Earls, MD;  [facility];  Service: Endoscopy;  Laterality: N/A;  . Dilation and curettage of uterus  many yrs ago    x 2  . Total knee arthroplasty Left 08/10/2012    Procedure:  TOTAL KNEE ARTHROPLASTY;  Surgeon: Mauri Pole, MD;  [facility];  Service: Orthopedics;  Laterality: Left;  . Joint replacement      left  . Open surgical repair of gluteal tendon Right 12/28/2012    Procedure: RIGHT GLUTEAL TENDON REPAIR;  Surgeon: Mauri Pole, MD;  [facility];  Service: Orthopedics;  Laterality: Right;    There were no vitals filed for this visit.  Visit Diagnosis:  Midline low back pain without sciatica  Difficulty walking  Foot drop, left  Muscle weakness (generalized)      Subjective Assessment - 09/07/15 1254    Subjective I was really sore from the waist down after the last visit.  But feeling really good today.   Currently in Pain? No/denies                         [facility] Adult PT Treatment/Exercise - 09/07/15 0001    Knee/Hip Exercises: Stretches   Quad Stretch 3 reps;30 seconds   Hip Flexor Stretch 3 reps;20 seconds   Other Knee/Hip Stretches IR and adductor stretches   Knee/Hip Exercises: Supine   Bridges with [employer] 20 reps   Bridges with Clamshell 20 reps   Single Leg  Bridge 20 reps   Knee/Hip Exercises: Sidelying   Hip ABduction 3 sets;10 reps   Hip ABduction Limitations with assit of red tband for abduction   Clams 2x15 each side   Other Sidelying Knee/Hip Exercises prone hip extension with assist   Other Sidelying Knee/Hip Exercises bridges then bridges with single leg, opposite leg up on PT shoulder to limit its use                  PT Short Term Goals - 07/28/15 1134    PT SHORT TERM GOAL #1   Title independent with initial HEP   Status Achieved           PT Long Term Goals - 09/07/15 1326    PT LONG TERM GOAL #1   Title decrease Tug time to 15 seconds   Status On-going   PT LONG TERM GOAL #2   Title decrease pain 50%   Status Achieved               Plan - 09/07/15 1323    Clinical Impression Statement Ther focus on the mat exercises really seems to make her  concentrate on the motions and she gets better motoions and it really wears her out.  This seems to be helping her get the hip moving better with less compensation.   Pt will benefit from skilled therapeutic intervention in order to improve on the following deficits Abnormal gait;Decreased activity tolerance;Decreased balance;Decreased mobility;Decreased range of motion;Difficulty walking;Decreased strength;Increased muscle spasms;Pain   PT Next Visit Plan continue to focus on the single leg exercises and may go to the mat exercises to really focus on this.  Write renewal   Consulted and Agree with Plan of Care Patient        Problem List Patient Active Problem List   Diagnosis Date Noted  . Right leg pain 08/15/2015  . Postoperative anemia due to acute blood loss 12/30/2014  . Essential hypertension, benign 07/04/2014  . Accelerated hypertension 05/05/2014  . Wheezing 03/30/2014  . Pulmonary embolism ([place]) 10/14/2013  . Acute respiratory failure ([place]) 10/14/2013  . Chest pain 10/14/2013  . Anemia 10/14/2013  . PE (pulmonary embolism) 10/14/2013  . Urinary frequency 04/07/2013  . Urinary incontinence 04/07/2013  . Right gluteus tear 12/28/2012  . Generalized anxiety disorder 08/19/2012  . Unspecified constipation 08/19/2012  . GERD (gastroesophageal reflux disease) 08/19/2012  . Overweight (BMI 25.0-29.9) 08/11/2012  . Expected blood loss anemia 08/11/2012  . Knee pain, left 03/31/2012  . Depression 09/10/2011  . S/P Nissen fundoplication (without gastrostomy tube) procedure 07/26/2011  . Chronic cough 01/09/2011  . RESTLESS LEG SYNDROME 05/05/2008  . HYPERLIPIDEMIA 09/14/2007  . Osteopenia 09/14/2007    Sumner Boast., PT 09/07/2015, 1:29 PM  [address] [place], [place], [address] Phone: [phone]   Fax:  [phone]  Name: Felicia Cruz MRN: [id number] Date of Birth: 10-11-1940

## 2015-09-07 NOTE — Addendum Note (Signed)
Addended by: Sumner Boast on: 09/07/2015 01:02 PM   Modules accepted: Orders

## 2015-09-10 ENCOUNTER — Other Ambulatory Visit: Payer: Self-pay | Admitting: Family Medicine

## 2015-09-13 NOTE — Addendum Note (Signed)
Addended by: Sumner Boast on: 09/13/2015 11:27 AM   Modules accepted: Orders

## 2015-09-18 ENCOUNTER — Encounter: Payer: Self-pay | Admitting: Physical Therapy

## 2015-09-18 ENCOUNTER — Ambulatory Visit: Payer: Medicare Other | Admitting: Physical Therapy

## 2015-09-18 DIAGNOSIS — R262 Difficulty in walking, not elsewhere classified: Secondary | ICD-10-CM

## 2015-09-18 DIAGNOSIS — M545 Low back pain: Secondary | ICD-10-CM | POA: Diagnosis not present

## 2015-09-18 DIAGNOSIS — M21372 Foot drop, left foot: Secondary | ICD-10-CM | POA: Diagnosis not present

## 2015-09-18 DIAGNOSIS — M6281 Muscle weakness (generalized): Secondary | ICD-10-CM | POA: Diagnosis not present

## 2015-09-18 DIAGNOSIS — M6289 Other specified disorders of muscle: Secondary | ICD-10-CM | POA: Diagnosis not present

## 2015-09-18 NOTE — Therapy (Signed)
[place], [place], [address] Phone: [phone]   Fax:  [phone]  Physical Therapy Treatment  Patient Details  Name: Felicia Cruz MRN: [id number] Date of Birth: 05-23-1941 Referring Provider: Harold Hedge  Encounter Date: 09/18/2015      PT End of Session - 09/18/15 1348    Visit Number 17   Date for PT Re-Evaluation 10/07/15   PT Start Time 1308   PT Stop Time 1353   PT Time Calculation (min) 45 min   Activity Tolerance Patient tolerated treatment well   Behavior During Therapy [facility] for tasks assessed/performed      Past Medical History  Diagnosis Date  . RLS (restless legs syndrome)   . Esophageal stricture 2010  . Osteopenia   . Hyperlipemia   . Arthritis   . Kidney stones   . GERD (gastroesophageal reflux disease)   . Substance abuse     RECOVERING ALCOLHOLIC  . Alcoholism ([place])     HAS NOT HAD [place]  . Cataract   . 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 Hiatal hernia   . DDD (degenerative disc disease), lumbar   . Chronic back pain     Past Surgical History  Procedure Laterality Date  . Lumbar disc surgery  2008  . Facial cosmetic surgery      eyes, nose  . Abdominoplasty    . Liposuction  15 to 20 yrs ago  . Surgery for kidney stones    . Right index finger surgery  06/19/11    AT [facility]  . Laparoscopic nissen fundoplication  [id number]    Procedure: LAPAROSCOPIC NISSEN FUNDOPLICATION;  Surgeon: Pedro Earls, MD;  [facility];  Service: General;  Laterality: N/A;  Laparoscopic Nissen repair of hiatal hernia  with lighted bougie  . Esophageal manometry  02/17/2012    Procedure: ESOPHAGEAL MANOMETRY (EM);  Surgeon: Pedro Earls, MD;  [facility];  Service: Endoscopy;  Laterality: N/A;  . Dilation and curettage of uterus  many yrs ago    x 2  . Total knee arthroplasty Left 08/10/2012     Procedure: TOTAL KNEE ARTHROPLASTY;  Surgeon: Mauri Pole, MD;  [facility];  Service: Orthopedics;  Laterality: Left;  . Joint replacement      left  . Open surgical repair of gluteal tendon Right 12/28/2012    Procedure: RIGHT GLUTEAL TENDON REPAIR;  Surgeon: Mauri Pole, MD;  [facility];  Service: Orthopedics;  Laterality: Right;    There were no vitals filed for this visit.      Subjective Assessment - 09/18/15 1319    Subjective Patient reports that she was able to go to the beach, did not walk on the beach just went and looked and did okay, no falls and just really tired   Currently in Pain? No/denies                         [facility] Adult PT Treatment/Exercise - 09/18/15 0001    Lumbar Exercises: Aerobic   UBE (Upper Arm Bike) NuStep Level 6 x 5 minutes, did not use arms today   Knee/Hip Exercises: Stretches   Sports administrator 3 reps;30 seconds   Hip Flexor Stretch 3 reps;20 seconds   Other Knee/Hip Stretches IR and adductor stretches   Knee/Hip Exercises: Machines for Strengthening   Cybex Leg  Press 30# single legs only   Hip Cybex 8" step ups   Other Machine standing 3# hip abduction with verbal and tactile cues to decrease trunk lean as well as to keep foot forward to not compensate   Knee/Hip Exercises: Supine   Bridges with [employer] 20 reps   Bridges with Clamshell 20 reps   Single Leg Bridge 20 reps   Knee/Hip Exercises: Sidelying   Hip ABduction 3 sets;10 reps   Hip ABduction Limitations with assit of red tband for abduction   Clams 2x15 each side   Other Sidelying Knee/Hip Exercises prone hip extension with assist   Other Sidelying Knee/Hip Exercises bridges then bridges with single leg, opposite leg up on PT shoulder to limit its use                  PT Short Term Goals - 07/28/15 1134    PT SHORT TERM GOAL #1   Title independent with initial HEP   Status Achieved           PT Long Term Goals - 09/07/15 1326     PT LONG TERM GOAL #1   Title decrease Tug time to 15 seconds   Status On-going   PT LONG TERM GOAL #2   Title decrease pain 50%   Status Achieved               Plan - 09/18/15 1349    Clinical Impression Statement Hip is slowly starting to get stronger, she still has significant give of the hip with stance phase of gait.   PT Next Visit Plan continue to focus on the single leg exercises and may go to the mat exercises to really focus on this.    Consulted and Agree with Plan of Care Patient      Patient will benefit from skilled therapeutic intervention in order to improve the following deficits and impairments:  Abnormal gait, Decreased activity tolerance, Decreased balance, Decreased mobility, Decreased range of motion, Difficulty walking, Decreased strength, Increased muscle spasms, Pain  Visit Diagnosis: Difficulty walking  Muscle weakness (generalized)     Problem List Patient Active Problem List   Diagnosis Date Noted  . Right leg pain 08/15/2015  . Postoperative anemia due to acute blood loss 12/30/2014  . Essential hypertension, benign 07/04/2014  . Accelerated hypertension 05/05/2014  . Wheezing 03/30/2014  . Pulmonary embolism ([place]) 10/14/2013  . Acute respiratory failure ([place]) 10/14/2013  . Chest pain 10/14/2013  . Anemia 10/14/2013  . PE (pulmonary embolism) 10/14/2013  . Urinary frequency 04/07/2013  . Urinary incontinence 04/07/2013  . Right gluteus tear 12/28/2012  . Generalized anxiety disorder 08/19/2012  . Unspecified constipation 08/19/2012  . GERD (gastroesophageal reflux disease) 08/19/2012  . Overweight (BMI 25.0-29.9) 08/11/2012  . Expected blood loss anemia 08/11/2012  . Knee pain, left 03/31/2012  . Depression 09/10/2011  . S/P Nissen fundoplication (without gastrostomy tube) procedure 07/26/2011  . Chronic cough 01/09/2011  . RESTLESS LEG SYNDROME 05/05/2008  . HYPERLIPIDEMIA 09/14/2007  . Osteopenia 09/14/2007     Sumner Boast., PT 09/18/2015, 1:52 PM  [address] [place], [place], [address] Phone: [phone]   Fax:  [phone]  Name: ONICA GERGEL MRN: [id number] Date of Birth: Sep 20, 1940

## 2015-09-20 ENCOUNTER — Encounter: Payer: Self-pay | Admitting: Physical Therapy

## 2015-09-20 ENCOUNTER — Ambulatory Visit: Payer: Medicare Other | Admitting: Physical Therapy

## 2015-09-20 DIAGNOSIS — M6281 Muscle weakness (generalized): Secondary | ICD-10-CM | POA: Diagnosis not present

## 2015-09-20 DIAGNOSIS — M6289 Other specified disorders of muscle: Secondary | ICD-10-CM | POA: Diagnosis not present

## 2015-09-20 DIAGNOSIS — R262 Difficulty in walking, not elsewhere classified: Secondary | ICD-10-CM

## 2015-09-20 DIAGNOSIS — M545 Low back pain: Secondary | ICD-10-CM | POA: Diagnosis not present

## 2015-09-20 DIAGNOSIS — M21372 Foot drop, left foot: Secondary | ICD-10-CM | POA: Diagnosis not present

## 2015-09-20 NOTE — Therapy (Signed)
[place], [place], [address] Phone: [phone]   Fax:  [phone]  Physical Therapy Treatment  Patient Details  Name: Felicia Cruz MRN: [id number] Date of Birth: 01-20-1941 Referring Provider: Harold Hedge  Encounter Date: 09/20/2015      PT End of Session - 09/20/15 1337    Visit Number 18   Date for PT Re-Evaluation 10/07/15   PT Start Time 1250   PT Stop Time 1338   PT Time Calculation (min) 48 min   Activity Tolerance Patient tolerated treatment well   Behavior During Therapy [facility] for tasks assessed/performed      Past Medical History  Diagnosis Date  . RLS (restless legs syndrome)   . Esophageal stricture 2010  . Osteopenia   . Hyperlipemia   . Arthritis   . Kidney stones   . GERD (gastroesophageal reflux disease)   . Substance abuse     RECOVERING ALCOLHOLIC  . Alcoholism ([place])     HAS NOT HAD [place]  . Cataract   . 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 Hiatal hernia   . DDD (degenerative disc disease), lumbar   . Chronic back pain     Past Surgical History  Procedure Laterality Date  . Lumbar disc surgery  2008  . Facial cosmetic surgery      eyes, nose  . Abdominoplasty    . Liposuction  15 to 20 yrs ago  . Surgery for kidney stones    . Right index finger surgery  06/19/11    AT [facility]  . Laparoscopic nissen fundoplication  [id number]    Procedure: LAPAROSCOPIC NISSEN FUNDOPLICATION;  Surgeon: Pedro Earls, MD;  [facility];  Service: General;  Laterality: N/A;  Laparoscopic Nissen repair of hiatal hernia  with lighted bougie  . Esophageal manometry  02/17/2012    Procedure: ESOPHAGEAL MANOMETRY (EM);  Surgeon: Pedro Earls, MD;  [facility];  Service: Endoscopy;  Laterality: N/A;  . Dilation and curettage of uterus  many yrs ago    x 2  . Total knee arthroplasty Left 08/10/2012     Procedure: TOTAL KNEE ARTHROPLASTY;  Surgeon: Mauri Pole, MD;  [facility];  Service: Orthopedics;  Laterality: Left;  . Joint replacement      left  . Open surgical repair of gluteal tendon Right 12/28/2012    Procedure: RIGHT GLUTEAL TENDON REPAIR;  Surgeon: Mauri Pole, MD;  [facility];  Service: Orthopedics;  Laterality: Right;    There were no vitals filed for this visit.      Subjective Assessment - 09/20/15 1255    Subjective I still get very tired and sore after the exercises.  No falls   Currently in Pain? No/denies            [facility] PT Assessment - 09/20/15 0001    Ambulation/Gait   Gait Comments we have decided to stick with use of the 4 WW due to her falls, she is independent with this with minimal to no compensatory patterns at this time.  When we go to a single point cane she is good but the toe catching increases her risk for falls,                     [facility] Adult PT Treatment/Exercise - 09/20/15 0001    Lumbar Exercises: Aerobic   Elliptical  R=6, I=10 x 4 minutes   Knee/Hip Exercises: Stretches   Sports administrator 3 reps;30 seconds   Hip Flexor Stretch 3 reps;20 seconds   Other Knee/Hip Stretches IR and adductor stretches   Knee/Hip Exercises: Machines for Strengthening   Cybex Leg Press 30# single legs only   Hip Cybex 8" step ups, really focused on slow and controlled right LE step up and controlled step down   Other Machine standing 3# hip abduction with verbal and tactile cues to decrease trunk lean as well as to keep foot forward to not compensate   Knee/Hip Exercises: Supine   Bridges with [employer] 20 reps   Single Leg Bridge 20 reps   Knee/Hip Exercises: Sidelying   Hip ABduction 3 sets;10 reps   Hip ABduction Limitations with assit of red tband for abduction   Other Sidelying Knee/Hip Exercises prone hip extension with assist   Other Sidelying Knee/Hip Exercises bridges then bridges with single leg, opposite leg up on PT  shoulder to limit its use                  PT Short Term Goals - 07/28/15 1134    PT SHORT TERM GOAL #1   Title independent with initial HEP   Status Achieved           PT Long Term Goals - 09/20/15 1345    PT LONG TERM GOAL #2   Title decrease pain 50%   Status Achieved   PT LONG TERM GOAL #3   Title walk with a SPC x 200 feet   Status Achieved   PT LONG TERM GOAL #4   Title increase right hip abduction to 15 degrees   Status Achieved               Plan - 09/20/15 1338    Clinical Impression Statement She is demonstrating good independence with less compensation and since we returned to using only the walker she has had no falls or stumbles.   PT Next Visit Plan work on an independent gym program and how she can do abduction at home and watch compensatory patterns, looking to get her independent with this and possible D/C   Consulted and Agree with Plan of Care Patient      Patient will benefit from skilled therapeutic intervention in order to improve the following deficits and impairments:  Abnormal gait, Decreased activity tolerance, Decreased balance, Decreased mobility, Decreased range of motion, Difficulty walking, Decreased strength, Increased muscle spasms, Pain  Visit Diagnosis: Muscle weakness (generalized)  Difficulty walking     Problem List Patient Active Problem List   Diagnosis Date Noted  . Right leg pain 08/15/2015  . Postoperative anemia due to acute blood loss 12/30/2014  . Essential hypertension, benign 07/04/2014  . Accelerated hypertension 05/05/2014  . Wheezing 03/30/2014  . Pulmonary embolism ([place]) 10/14/2013  . Acute respiratory failure ([place]) 10/14/2013  . Chest pain 10/14/2013  . Anemia 10/14/2013  . PE (pulmonary embolism) 10/14/2013  . Urinary frequency 04/07/2013  . Urinary incontinence 04/07/2013  . Right gluteus tear 12/28/2012  . Generalized anxiety disorder 08/19/2012  . Unspecified constipation 08/19/2012   . GERD (gastroesophageal reflux disease) 08/19/2012  . Overweight (BMI 25.0-29.9) 08/11/2012  . Expected blood loss anemia 08/11/2012  . Knee pain, left 03/31/2012  . Depression 09/10/2011  . S/P Nissen fundoplication (without gastrostomy tube) procedure 07/26/2011  . Chronic cough 01/09/2011  . RESTLESS LEG SYNDROME 05/05/2008  . HYPERLIPIDEMIA 09/14/2007  . Osteopenia  09/14/2007    Sumner Boast., PT 09/20/2015, 1:46 PM  [place] [address] [place], [place], [address] Phone: [phone]   Fax:  [phone]  Name: SHREENA EILAND MRN: [id number] Date of Birth: 10/05/1940

## 2015-09-25 ENCOUNTER — Encounter: Payer: Self-pay | Admitting: Physical Therapy

## 2015-09-25 ENCOUNTER — Ambulatory Visit: Payer: Medicare Other | Admitting: Physical Therapy

## 2015-09-25 DIAGNOSIS — M6281 Muscle weakness (generalized): Secondary | ICD-10-CM

## 2015-09-25 DIAGNOSIS — R262 Difficulty in walking, not elsewhere classified: Secondary | ICD-10-CM

## 2015-09-25 DIAGNOSIS — M545 Low back pain: Secondary | ICD-10-CM | POA: Diagnosis not present

## 2015-09-25 DIAGNOSIS — M6289 Other specified disorders of muscle: Secondary | ICD-10-CM | POA: Diagnosis not present

## 2015-09-25 DIAGNOSIS — M21372 Foot drop, left foot: Secondary | ICD-10-CM | POA: Diagnosis not present

## 2015-09-25 NOTE — Therapy (Signed)
[place] [place], [place], [address] Phone: [phone]   Fax:  [phone]  Physical Therapy Treatment  Patient Details  Name: Felicia Cruz MRN: [id number] Date of Birth: May 17, 1941 Referring Provider: Harold Hedge  Encounter Date: 09/25/2015      PT End of Session - 09/25/15 0952    Visit Number 19   Date for PT Re-Evaluation 10/07/15   PT Start Time 0915   PT Stop Time 1010   PT Time Calculation (min) 55 min   Activity Tolerance Patient tolerated treatment well   Behavior During Therapy [facility] for tasks assessed/performed      Past Medical History  Diagnosis Date  . RLS (restless legs syndrome)   . Esophageal stricture 2010  . Osteopenia   . Hyperlipemia   . Arthritis   . Kidney stones   . GERD (gastroesophageal reflux disease)   . Substance abuse     RECOVERING ALCOLHOLIC  . Alcoholism ([place])     HAS NOT HAD [place]  . Cataract   . 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 Hiatal hernia   . DDD (degenerative disc disease), lumbar   . Chronic back pain     Past Surgical History  Procedure Laterality Date  . Lumbar disc surgery  2008  . Facial cosmetic surgery      eyes, nose  . Abdominoplasty    . Liposuction  15 to 20 yrs ago  . Surgery for kidney stones    . Right index finger surgery  06/19/11    AT [facility]  . Laparoscopic nissen fundoplication  [id number]    Procedure: LAPAROSCOPIC NISSEN FUNDOPLICATION;  Surgeon: Pedro Earls, MD;  [facility];  Service: General;  Laterality: N/A;  Laparoscopic Nissen repair of hiatal hernia  with lighted bougie  . Esophageal manometry  02/17/2012    Procedure: ESOPHAGEAL MANOMETRY (EM);  Surgeon: Pedro Earls, MD;  [facility];  Service: Endoscopy;  Laterality: N/A;  . Dilation and curettage of uterus  many yrs ago    x 2  . 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 Joint replacement      left  . Open surgical repair of gluteal tendon Right 12/28/2012    Procedure: RIGHT GLUTEAL TENDON REPAIR;  Surgeon: Mauri Pole, MD;  [facility];  Service: Orthopedics;  Laterality: Right;    There were no vitals filed for this visit.      Subjective Assessment - 09/25/15 0938    Subjective Went away for the weekend, but it rained so I did not do anything.  No stumbles/falls   Currently in Pain? No/denies                         [facility] Adult PT Treatment/Exercise - 09/25/15 0001    Lumbar Exercises: Aerobic   Elliptical R=6, I=10 x 4 minutes   Knee/Hip Exercises: Stretches   Sports administrator 3 reps;30 seconds   Hip Flexor Stretch 3 reps;20 seconds   Other Knee/Hip Stretches IR and adductor stretches   Knee/Hip Exercises: Machines for Strengthening   Cybex Leg Press 40# single legs, with some assist   Hip Cybex 8" step ups, really focused on slow and controlled right LE step up  and controlled step down   Other Machine standing 3# hip abduction with verbal and tactile cues to decrease trunk lean as well as to keep foot forward to not compensate   Knee/Hip Exercises: Supine   Bridges with [employer] 20 reps   Bridges with Clamshell 20 reps   Single Leg Bridge 20 reps   Knee/Hip Exercises: Sidelying   Hip ABduction 3 sets;10 reps   Hip ABduction Limitations with assit of red tband for abduction   Other Sidelying Knee/Hip Exercises prone hip extension with assist                  PT Short Term Goals - 07/28/15 1134    PT SHORT TERM GOAL #1   Title independent with initial HEP   Status Achieved           PT Long Term Goals - 09/20/15 1345    PT LONG TERM GOAL #2   Title decrease pain 50%   Status Achieved   PT LONG TERM GOAL #3   Title walk with a SPC x 200 feet   Status Achieved   PT  LONG TERM GOAL #4   Title increase right hip abduction to 15 degrees   Status Achieved               Plan - 09/25/15 0952    Clinical Impression Statement Continues to have some increased ability with hip abduction , however as stated previously we will continue to recommend her using the walker for safety   PT Next Visit Plan work on an independent gym program and how she can do abduction at home and watch compensatory patterns, looking to get her independent with this and possible D/C   Consulted and Agree with Plan of Care Patient      Patient will benefit from skilled therapeutic intervention in order to improve the following deficits and impairments:     Visit Diagnosis: Muscle weakness (generalized)  Difficulty walking     Problem List Patient Active Problem List   Diagnosis Date Noted  . Right leg pain 08/15/2015  . Postoperative anemia due to acute blood loss 12/30/2014  . Essential hypertension, benign 07/04/2014  . Accelerated hypertension 05/05/2014  . Wheezing 03/30/2014  . Pulmonary embolism ([place]) 10/14/2013  . Acute respiratory failure ([place]) 10/14/2013  . Chest pain 10/14/2013  . Anemia 10/14/2013  . PE (pulmonary embolism) 10/14/2013  . Urinary frequency 04/07/2013  . Urinary incontinence 04/07/2013  . Right gluteus tear 12/28/2012  . Generalized anxiety disorder 08/19/2012  . Unspecified constipation 08/19/2012  . GERD (gastroesophageal reflux disease) 08/19/2012  . Overweight (BMI 25.0-29.9) 08/11/2012  . Expected blood loss anemia 08/11/2012  . Knee pain, left 03/31/2012  . Depression 09/10/2011  . S/P Nissen fundoplication (without gastrostomy tube) procedure 07/26/2011  . Chronic cough 01/09/2011  . RESTLESS LEG SYNDROME 05/05/2008  . HYPERLIPIDEMIA 09/14/2007  . Osteopenia 09/14/2007    Sumner Boast., PT 09/25/2015, 10:06 AM  [place],  [place], [address] Phone: [phone]   Fax:  [phone]  Name: Felicia Cruz MRN: [id number] Date of Birth: 08-Apr-1941

## 2015-09-26 ENCOUNTER — Encounter: Payer: Self-pay | Admitting: Physical Therapy

## 2015-09-26 ENCOUNTER — Ambulatory Visit: Payer: Medicare Other | Admitting: Physical Therapy

## 2015-09-26 DIAGNOSIS — R262 Difficulty in walking, not elsewhere classified: Secondary | ICD-10-CM | POA: Diagnosis not present

## 2015-09-26 DIAGNOSIS — M21372 Foot drop, left foot: Secondary | ICD-10-CM | POA: Diagnosis not present

## 2015-09-26 DIAGNOSIS — M545 Low back pain: Secondary | ICD-10-CM | POA: Diagnosis not present

## 2015-09-26 DIAGNOSIS — M6289 Other specified disorders of muscle: Secondary | ICD-10-CM | POA: Diagnosis not present

## 2015-09-26 DIAGNOSIS — M6281 Muscle weakness (generalized): Secondary | ICD-10-CM | POA: Diagnosis not present

## 2015-09-26 NOTE — Therapy (Signed)
[facility] [address] [place] [address] [place], [place], [address] Phone: [phone]   Fax:  [phone]  Physical Therapy Treatment  Patient Details  Name: Felicia Cruz MRN: [id number] Date of Birth: 1940-07-01 Referring Provider: Arloa Koh  Encounter Date: 09/26/2015      PT End of Session - 09/26/15 1134    Visit Number 20   Date for PT Re-Evaluation 10/07/15   PT Start Time 0920   PT Stop Time 1015   PT Time Calculation (min) 55 min   Activity Tolerance Patient tolerated treatment well   Behavior During Therapy [facility] for tasks assessed/performed      Past Medical History  Diagnosis Date  . RLS (restless legs syndrome)   . Esophageal stricture 2010  . Osteopenia   . Hyperlipemia   . Arthritis   . Kidney stones   . GERD (gastroesophageal reflux disease)   . Substance abuse     RECOVERING ALCOLHOLIC  . Alcoholism ([facility])     HAS NOT HAD DRINK SINCE 1995  . Cataract   . 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 Hiatal hernia   . DDD (degenerative disc disease), lumbar   . Chronic back pain     Past Surgical History  Procedure Laterality Date  . Lumbar disc surgery  2008  . Facial cosmetic surgery      eyes, nose  . Abdominoplasty    . Liposuction  15 to 20 yrs ago  . Surgery for kidney stones    . Right index finger surgery  06/19/11    AT [facility]  . Laparoscopic nissen fundoplication  06/26/2011    Procedure: LAPAROSCOPIC NISSEN FUNDOPLICATION;  Surgeon: Valarie Merino, MD;  [facility];  Service: General;  Laterality: N/A;  Laparoscopic Nissen repair of hiatal hernia  with lighted bougie  . Esophageal manometry  02/17/2012    Procedure: ESOPHAGEAL MANOMETRY (EM);  Surgeon: Valarie Merino, MD;  [facility];  Service: Endoscopy;  Laterality: N/A;  . Dilation and curettage of uterus  many yrs ago    x 2  . Total knee arthroplasty Left 08/10/2012     Procedure: TOTAL KNEE ARTHROPLASTY;  Surgeon: Shelda Pal, MD;  [facility];  Service: Orthopedics;  Laterality: Left;  . Joint replacement      left  . Open surgical repair of gluteal tendon Right 12/28/2012    Procedure: RIGHT GLUTEAL TENDON REPAIR;  Surgeon: Shelda Pal, MD;  [facility];  Service: Orthopedics;  Laterality: Right;    There were no vitals filed for this visit.      Subjective Assessment - 09/26/15 0945    Subjective I am feeling better about exercising at the gym where I live   Currently in Pain? No/denies            [facility] PT Assessment - 09/26/15 0001    Timed Up and Go Test   Normal TUG (seconds) 15                     [facility] Adult PT Treatment/Exercise - 09/26/15 0001    Self-Care   Self-Care Other Self-Care Comments   Other Self-Care Comments  Patient brought in pix of her gym and we wnet over what to do and not do, what to be cautious of, weights, reps and time, how to slowly progress   Lumbar Exercises: Aerobic   Elliptical  R=6, I=10 x 4 minutes   Knee/Hip Exercises: Stretches   Sports administrator 3 reps;30 seconds   Hip Flexor Stretch 3 reps;20 seconds   Other Knee/Hip Stretches IR and adductor stretches   Knee/Hip Exercises: Supine   Bridges with [employer] 20 reps   Bridges with Clamshell 20 reps   Single Leg Bridge 20 reps   Knee/Hip Exercises: Sidelying   Other Sidelying Knee/Hip Exercises prone hip extension with assist                  PT Short Term Goals - 07/28/15 1134    PT SHORT TERM GOAL #1   Title independent with initial HEP   Status Achieved           PT Long Term Goals - 09/26/15 1136    PT LONG TERM GOAL #1   Title decrease Tug time to 15 seconds   Status Achieved   PT LONG TERM GOAL #2   Title decrease pain 50%   Status Achieved   PT LONG TERM GOAL #3   Title walk with a SPC x 200 feet   Status Achieved   PT LONG TERM GOAL #4   Title increase right hip abduction to 15 degrees    Status Achieved               Plan - 09/26/15 1135    Clinical Impression Statement Patient has done well with our program, she is at a level that we were at prior to the start of the year when she had the fall and regressed.  Still with difficulty walking, but is very safe with a 4WW.  Has ability to lift right leg into abduction in right side lying.  Patient able to verbalize understanding of what we went over for gym safety   PT Next Visit Plan will D/C   Consulted and Agree with Plan of Care Patient      Patient will benefit from skilled therapeutic intervention in order to improve the following deficits and impairments:  Abnormal gait, Decreased activity tolerance, Decreased balance, Decreased mobility, Decreased range of motion, Difficulty walking, Decreased strength, Increased muscle spasms, Pain  Visit Diagnosis: Muscle weakness (generalized)  Difficulty walking     Problem List Patient Active Problem List   Diagnosis Date Noted  . Right leg pain 08/15/2015  . Postoperative anemia due to acute blood loss 12/30/2014  . Essential hypertension, benign 07/04/2014  . Accelerated hypertension 05/05/2014  . Wheezing 03/30/2014  . Pulmonary embolism ([place]) 10/14/2013  . Acute respiratory failure ([place]) 10/14/2013  . Chest pain 10/14/2013  . Anemia 10/14/2013  . PE (pulmonary embolism) 10/14/2013  . Urinary frequency 04/07/2013  . Urinary incontinence 04/07/2013  . Right gluteus tear 12/28/2012  . Generalized anxiety disorder 08/19/2012  . Unspecified constipation 08/19/2012  . GERD (gastroesophageal reflux disease) 08/19/2012  . Overweight (BMI 25.0-29.9) 08/11/2012  . Expected blood loss anemia 08/11/2012  . Knee pain, left 03/31/2012  . Depression 09/10/2011  . S/P Nissen fundoplication (without gastrostomy tube) procedure 07/26/2011  . Chronic cough 01/09/2011  . RESTLESS LEG SYNDROME 05/05/2008  . HYPERLIPIDEMIA 09/14/2007  . Osteopenia 09/14/2007   PHYSICAL THERAPY DISCHARGE SUMMARY   Plan: Patient agrees to discharge.  Patient goals were met Patient is being discharged due to meeting the stated rehab goals.  ?????       Sumner Boast., PT 09/26/2015, 11:40 AM  [place],  [place], [address] Phone: [phone]   Fax:  [phone]  Name: Felicia Cruz MRN: [id number] Date of Birth: Jan 26, 1941

## 2015-11-01 DIAGNOSIS — L570 Actinic keratosis: Secondary | ICD-10-CM | POA: Diagnosis not present

## 2015-11-01 DIAGNOSIS — Z85828 Personal history of other malignant neoplasm of skin: Secondary | ICD-10-CM | POA: Diagnosis not present

## 2015-11-01 DIAGNOSIS — L821 Other seborrheic keratosis: Secondary | ICD-10-CM | POA: Diagnosis not present

## 2015-11-01 DIAGNOSIS — D1801 Hemangioma of skin and subcutaneous tissue: Secondary | ICD-10-CM | POA: Diagnosis not present

## 2015-11-01 DIAGNOSIS — L820 Inflamed seborrheic keratosis: Secondary | ICD-10-CM | POA: Diagnosis not present

## 2015-12-13 ENCOUNTER — Other Ambulatory Visit: Payer: Self-pay | Admitting: Family Medicine

## 2015-12-14 DIAGNOSIS — J449 Chronic obstructive pulmonary disease, unspecified: Secondary | ICD-10-CM | POA: Diagnosis not present

## 2015-12-14 DIAGNOSIS — Z87891 Personal history of nicotine dependence: Secondary | ICD-10-CM | POA: Diagnosis not present

## 2015-12-14 DIAGNOSIS — Z79899 Other long term (current) drug therapy: Secondary | ICD-10-CM | POA: Diagnosis not present

## 2015-12-14 DIAGNOSIS — R5383 Other fatigue: Secondary | ICD-10-CM | POA: Diagnosis not present

## 2015-12-14 DIAGNOSIS — Z981 Arthrodesis status: Secondary | ICD-10-CM | POA: Diagnosis not present

## 2015-12-14 DIAGNOSIS — I10 Essential (primary) hypertension: Secondary | ICD-10-CM | POA: Diagnosis not present

## 2015-12-14 DIAGNOSIS — Z888 Allergy status to other drugs, medicaments and biological substances status: Secondary | ICD-10-CM | POA: Diagnosis not present

## 2015-12-14 DIAGNOSIS — Z880 Allergy status to penicillin: Secondary | ICD-10-CM | POA: Diagnosis not present

## 2016-01-03 ENCOUNTER — Telehealth: Payer: Self-pay | Admitting: Family Medicine

## 2016-01-05 ENCOUNTER — Other Ambulatory Visit: Payer: Self-pay | Admitting: Family Medicine

## 2016-01-05 ENCOUNTER — Ambulatory Visit (INDEPENDENT_AMBULATORY_CARE_PROVIDER_SITE_OTHER): Payer: Medicare Other | Admitting: Family Medicine

## 2016-01-05 ENCOUNTER — Encounter: Payer: Self-pay | Admitting: Family Medicine

## 2016-01-05 VITALS — BP 128/78 | HR 89 | Temp 98.0°F | Ht 66.0 in | Wt 187.4 lb

## 2016-01-05 DIAGNOSIS — G2581 Restless legs syndrome: Secondary | ICD-10-CM | POA: Diagnosis not present

## 2016-01-05 DIAGNOSIS — R5383 Other fatigue: Secondary | ICD-10-CM | POA: Diagnosis not present

## 2016-01-05 DIAGNOSIS — I10 Essential (primary) hypertension: Secondary | ICD-10-CM

## 2016-01-05 DIAGNOSIS — K219 Gastro-esophageal reflux disease without esophagitis: Secondary | ICD-10-CM

## 2016-01-05 LAB — BASIC METABOLIC PANEL
BUN: 19 mg/dL (ref 6–23)
CHLORIDE: 105 meq/L (ref 96–112)
CO2: 25 meq/L (ref 19–32)
Calcium: 9.8 mg/dL (ref 8.4–10.5)
Creatinine, Ser: 0.88 mg/dL (ref 0.40–1.20)
GFR: 66.6 mL/min (ref >60.00)
Glucose, Bld: 103 mg/dL — ABNORMAL HIGH (ref 70–99)
Potassium: 4.6 mEq/L (ref 3.5–5.1)
SODIUM: 140 meq/L (ref 135–145)

## 2016-01-05 LAB — CBC WITH DIFFERENTIAL/PLATELET
BASOS PCT: 0.6 % (ref 0.0–3.0)
Basophils Absolute: 0 10*3/uL (ref 0.0–0.1)
Eosinophils Absolute: 0.2 10*3/uL (ref 0.0–0.7)
Eosinophils Relative: 2.9 % (ref 0.0–5.0)
HEMATOCRIT: 36 % (ref 36.0–46.0)
Hemoglobin: 12 g/dL (ref 12.0–15.0)
LYMPHS PCT: 28.1 % (ref 12.0–46.0)
Lymphs Abs: 1.8 10*3/uL (ref 0.7–4.0)
MCHC: 33.5 g/dL (ref 30.0–36.0)
MCV: 83 fl (ref 78.0–100.0)
MONOS PCT: 6.2 % (ref 3.0–12.0)
Monocytes Absolute: 0.4 10*3/uL (ref 0.1–1.0)
NEUTROS ABS: 4 10*3/uL (ref 1.4–7.7)
Neutrophils Relative %: 62.2 % (ref 43.0–77.0)
PLATELETS: 398 10*3/uL (ref 150.0–400.0)
RBC: 4.34 Mil/uL (ref 3.87–5.11)
RDW: 15.6 % — AB (ref 11.5–15.5)
WBC: 6.4 10*3/uL (ref 4.0–10.5)

## 2016-01-05 LAB — VITAMIN B12: Vitamin B-12: 207 pg/mL — ABNORMAL LOW (ref 211–911)

## 2016-01-05 NOTE — Telephone Encounter (Signed)
error 

## 2016-01-05 NOTE — Progress Notes (Signed)
Pre visit review using our clinic review tool, if applicable. No additional management support is needed unless otherwise documented below in the visit note. 

## 2016-01-05 NOTE — Progress Notes (Signed)
Subjective:     Patient ID: Felicia Cruz, female   DOB: 1940/10/02, 75 y.o.   MRN: [id number]  HPI Patient seen with chief complaint of fatigue and decreased energy over several months. She reports that she saw her neurosurgeon over at [facility] and they suggest she get B12 shot. We did not see any prior history of B12 testing. In pulling up labs [facility] she did have vitamin D level of 26 recently with normal thyroid functions.  Her low energy has been going on for at least a year following her back surgery last June-2016. She has history of depression which is currently stable on Celexa. Appetite is good. She does have some impairment in mobility following her back surgery but is able to ambulate with a cane. Intermittent insomnia.  Denies any headaches, abdominal pain, dyspnea, chest pain, change in bowel habits, dysuria Medications reviewed. No recent changes. She does take several medications which might contribute to fatigue including Celexa, tramadol, and Requip Restless legs stable on Requip GERD controlled with Omeprazole.  No dysphagia.  Past Medical History:  Diagnosis Date  . Alcoholism ([place])    HAS NOT HAD [place]  . Arthritis   . Cataract   . Chronic back pain   . DDD (degenerative disc disease), lumbar   . Esophageal stricture 2010  . GERD (gastroesophageal reflux disease)   . Hiatal hernia   . Hyperlipemia   . Kidney stones   . Osteopenia   . 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 RLS (restless legs syndrome)   . Substance abuse    RECOVERING ALCOLHOLIC   Past Surgical History:  Procedure Laterality Date  . ABDOMINOPLASTY    . DILATION AND CURETTAGE OF UTERUS  many yrs ago   x 2  . ESOPHAGEAL MANOMETRY  02/17/2012   Procedure: ESOPHAGEAL MANOMETRY (EM);  Surgeon: Pedro Earls, MD;  [facility];  Service: Endoscopy;  Laterality: N/A;  . FACIAL COSMETIC SURGERY     eyes, nose  . JOINT REPLACEMENT     left  .  LAPAROSCOPIC NISSEN FUNDOPLICATION  [id number]   Procedure: LAPAROSCOPIC NISSEN FUNDOPLICATION;  Surgeon: Pedro Earls, MD;  [facility];  Service: General;  Laterality: N/A;  Laparoscopic Nissen repair of hiatal hernia  with lighted bougie  . LIPOSUCTION  15 to 20 yrs ago  . [place] SURGERY  2008  . OPEN SURGICAL REPAIR OF GLUTEAL TENDON Right 12/28/2012   Procedure: RIGHT GLUTEAL TENDON REPAIR;  Surgeon: Mauri Pole, MD;  [facility];  Service: Orthopedics;  Laterality: Right;  . RIGHT INDEX FINGER SURGERY  06/19/11   AT [facility]  . SURGERY FOR KIDNEY STONES    . 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   Procedure: TOTAL KNEE ARTHROPLASTY;  Surgeon: Mauri Pole, MD;  [facility];  Service: Orthopedics;  Laterality: Left;    reports that she quit smoking about 44 years ago. Her smoking use included Cigarettes. She has a 10.00 pack-year smoking history. She has never used smokeless tobacco. She reports that she does not drink alcohol or use drugs. family history includes Alzheimer's disease in her mother; Heart disease in her mother; Hip fracture in her mother; Hypertension in her mother; Stroke in her mother; Stroke (age of onset: 43) in her brother; Throat cancer in her father. Allergies  Allergen Reactions  . Gabapentin Other (See Comments)    Chest pain  . Lipitor [Atorvastatin]  Memory loss, confusion, hallucinations  . Lyrica [Pregabalin] Other (See Comments)    Water blisters on face  . Penicillins Hives, Shortness Of Breath and Itching  . Latex Other (See Comments)    Skin peeling (thumb)  . Methocarbamol Other (See Comments)    Blisters on face      Review of Systems  Constitutional: Positive for fatigue. Negative for appetite change, chills and fever.  Respiratory: Negative for shortness of breath.   Cardiovascular: Negative for chest pain.  Gastrointestinal: Negative for abdominal pain, blood in stool, nausea and vomiting.   Genitourinary: Negative for dysuria.  Neurological: Negative for dizziness, syncope and weakness.  Psychiatric/Behavioral: Negative for confusion and dysphoric mood.       Objective:   Physical Exam  Constitutional: She is oriented to person, place, and time. She appears well-developed and well-nourished.  HENT:  Mouth/Throat: Oropharynx is clear and moist.  Neck: Neck supple. No thyromegaly present.  Cardiovascular: Normal rate and regular rhythm.   Pulmonary/Chest: Effort normal and breath sounds normal. No respiratory distress. She has no wheezes. She has no rales.  Abdominal: Soft. There is no tenderness.  Musculoskeletal: She exhibits no edema.  Lymphadenopathy:    She has no cervical adenopathy.  Neurological: She is alert and oriented to person, place, and time. No cranial nerve deficit.  Skin: No rash noted.  Psychiatric: She has a normal mood and affect. Her behavior is normal. Judgment and thought content normal.       Assessment:     #1 fatigue. Likely multifactorial. Question related to decreased activity levels, medications, poor sleep quality. No suspicion for obstructive sleep apnea. Recent thyroid functions normal  #2 restless leg syndrome currently stable on Requip  #3 history of GERD controlled on low-dose omeprazole  #4 hypertension stable and controlled    Plan:     -Check further labs including CBC, chemistries, B12 level -Try to leave off tramadol is much as possible -sleep hygiene discussed  Eulas Post MD [facility]

## 2016-01-05 NOTE — Patient Instructions (Signed)
Fatigue  Fatigue is feeling tired all of the time, a lack of energy, or a lack of motivation. Occasional or mild fatigue is often a normal response to activity or life in general. However, long-lasting (chronic) or extreme fatigue may indicate an underlying medical condition.  HOME CARE INSTRUCTIONS   Watch your fatigue for any changes. The following actions may help to lessen any discomfort you are feeling:  · Talk to your health care provider about how much sleep you need each night. Try to get the required amount every night.  · Take medicines only as directed by your health care provider.  · Eat a healthy and nutritious diet. Ask your health care provider if you need help changing your diet.  · Drink enough fluid to keep your urine clear or pale yellow.  · Practice ways of relaxing, such as yoga, meditation, massage therapy, or acupuncture.  · Exercise regularly.    · Change situations that cause you stress. Try to keep your work and personal routine reasonable.  · Do not abuse illegal drugs.  · Limit alcohol intake to no more than 1 drink per day for nonpregnant women and 2 drinks per day for men. One drink equals 12 ounces of beer, 5 ounces of wine, or 1½ ounces of hard liquor.  · Take a multivitamin, if directed by your health care provider.  SEEK MEDICAL CARE IF:   · Your fatigue does not get better.  · You have a fever.    · You have unintentional weight loss or gain.  · You have headaches.    · You have difficulty:      Falling asleep.    Sleeping throughout the night.  · You feel angry, guilty, anxious, or sad.     · You are unable to have a bowel movement (constipation).    · You skin is dry.     · Your legs or another part of your body is swollen.    SEEK IMMEDIATE MEDICAL CARE IF:   · You feel confused.    · Your vision is blurry.  · You feel faint or pass out.    · You have a severe headache.    · You have severe abdominal, pelvic, or back pain.    · You have chest pain, shortness of breath, or an  irregular or fast heartbeat.    · You are unable to urinate or you urinate less than normal.    · You develop abnormal bleeding, such as bleeding from the rectum, vagina, nose, lungs, or nipples.  · You vomit blood.     · You have thoughts about harming yourself or committing suicide.    · You are worried that you might harm someone else.       This information is not intended to replace advice given to you by your health care provider. Make sure you discuss any questions you have with your health care provider.     Document Released: 03/17/2007 Document Revised: 06/10/2014 Document Reviewed: 09/21/2013  Elsevier Interactive Patient Education ©2016 Elsevier Inc.

## 2016-01-09 ENCOUNTER — Telehealth: Payer: Self-pay | Admitting: Family Medicine

## 2016-01-09 ENCOUNTER — Telehealth: Payer: Self-pay

## 2016-01-09 NOTE — Telephone Encounter (Signed)
Felicia Cruz pt returned your call and would like to have a call back.

## 2016-01-09 NOTE — Telephone Encounter (Signed)
Left Second message for pt to call back.

## 2016-01-09 NOTE — Telephone Encounter (Signed)
I have left a message with this pt to call back for her lab results. I accidentally closed the lab results so when she calls back the annotations need to be documented on this basket message. Looks like she needs to start B12 injections. This is a Felicia Cruz pt so her injections will be scheduled with you if she is willing to get them. Let me know if I can help. If she does not call back today then I will try her again tomorrow. Thanks.

## 2016-01-11 NOTE — Telephone Encounter (Signed)
Left 3rd message

## 2016-01-14 ENCOUNTER — Other Ambulatory Visit: Payer: Self-pay | Admitting: Family Medicine

## 2016-01-16 ENCOUNTER — Ambulatory Visit (INDEPENDENT_AMBULATORY_CARE_PROVIDER_SITE_OTHER): Payer: Medicare Other | Admitting: Family Medicine

## 2016-01-16 ENCOUNTER — Telehealth: Payer: Self-pay | Admitting: Family Medicine

## 2016-01-16 DIAGNOSIS — E538 Deficiency of other specified B group vitamins: Secondary | ICD-10-CM | POA: Diagnosis not present

## 2016-01-16 MED ORDER — CYANOCOBALAMIN 1000 MCG/ML IJ SOLN
1000.0000 ug | Freq: Once | INTRAMUSCULAR | Status: AC
  Administered 2016-01-16: 1000 ug via INTRAMUSCULAR

## 2016-01-16 NOTE — Telephone Encounter (Signed)
Pt is aware of results. I have her scheduled today for her B12 injection.

## 2016-01-16 NOTE — Telephone Encounter (Signed)
PT Would like blood work result

## 2016-01-22 ENCOUNTER — Telehealth: Payer: Self-pay | Admitting: *Deleted

## 2016-01-22 ENCOUNTER — Ambulatory Visit (INDEPENDENT_AMBULATORY_CARE_PROVIDER_SITE_OTHER): Payer: Medicare Other | Admitting: Family Medicine

## 2016-01-22 DIAGNOSIS — E538 Deficiency of other specified B group vitamins: Secondary | ICD-10-CM

## 2016-01-22 MED ORDER — CYANOCOBALAMIN 1000 MCG/ML IJ SOLN
1000.0000 ug | Freq: Once | INTRAMUSCULAR | Status: AC
Start: 2016-01-22 — End: 2016-01-22
  Administered 2016-01-22: 1000 ug via INTRAMUSCULAR

## 2016-01-22 NOTE — Telephone Encounter (Signed)
When patient came in for B12 injection asked about being able [place] Dr. Elease Hashimoto felt that only one week of missing injection would not cause a decline in patients B12. Stated that if she is really concerned about it could take over the counter B12 [phone] mcg. Left my name and number if patient has any more questions.

## 2016-02-14 ENCOUNTER — Ambulatory Visit (INDEPENDENT_AMBULATORY_CARE_PROVIDER_SITE_OTHER): Payer: Medicare Other | Admitting: Emergency Medicine

## 2016-02-14 DIAGNOSIS — E538 Deficiency of other specified B group vitamins: Secondary | ICD-10-CM

## 2016-02-14 MED ORDER — CYANOCOBALAMIN 1000 MCG/ML IJ SOLN
1000.0000 ug | Freq: Once | INTRAMUSCULAR | Status: AC
Start: 2016-02-14 — End: 2016-02-14
  Administered 2016-02-14: 1000 ug via INTRAMUSCULAR

## 2016-02-29 ENCOUNTER — Other Ambulatory Visit: Payer: Self-pay | Admitting: Family Medicine

## 2016-03-04 ENCOUNTER — Encounter: Payer: Self-pay | Admitting: Family Medicine

## 2016-03-04 ENCOUNTER — Ambulatory Visit (INDEPENDENT_AMBULATORY_CARE_PROVIDER_SITE_OTHER): Payer: Medicare Other | Admitting: Family Medicine

## 2016-03-04 VITALS — BP 148/70 | HR 100 | Temp 98.0°F | Wt 187.6 lb

## 2016-03-04 DIAGNOSIS — E538 Deficiency of other specified B group vitamins: Secondary | ICD-10-CM

## 2016-03-04 DIAGNOSIS — Z23 Encounter for immunization: Secondary | ICD-10-CM

## 2016-03-04 MED ORDER — "SYRINGE/NEEDLE (DISP) 25G X 1"" 3 ML MISC": 0 refills | Status: DC

## 2016-03-04 MED ORDER — CYANOCOBALAMIN 1000 MCG/ML IJ SOLN: 1000.0000 ug | Freq: Once | INTRAMUSCULAR | 0 refills | Status: DC

## 2016-03-04 MED ORDER — CYANOCOBALAMIN 1000 MCG/ML IJ SOLN
1000.0000 ug | Freq: Once | INTRAMUSCULAR | Status: AC
  Administered 2016-03-04: 1000 ug via INTRAMUSCULAR

## 2016-03-04 NOTE — Patient Instructions (Signed)
Vitamin B12...........Marland Kitchen 1 mL monthly forever  I would recommend the [facility] office........Marland Kitchen Dr. Larose Kells.......Marland Kitchen Dr. Darrelyn Hillock,,,,,,,, for your next physical examination and get established for long-term care  It's been my honor and pleasure to be your doctor all these years. Thanks again for come in to see me. Good luck in the future

## 2016-03-04 NOTE — Progress Notes (Signed)
Pre visit review using our clinic review tool, if applicable. No additional management support is needed unless otherwise documented below in the visit note. 

## 2016-03-04 NOTE — Progress Notes (Signed)
Felicia Cruz is a 75 year old widowed female nonsmoker who comes in today for follow-up of B12 deficiency  She was seen here this past summer by Dr. Darnell Level B for fatigue. She was found to have a low B12 level. She's been given a cc of B12 4 over the past 6 weeks. She's here today for follow-up. I informed her she can give herself her B12 at home.  We discussed her overall health. She's due for physical examination this fall. She recently moved to [facility] and is close to our new [employer]. She would like to switch their for convenience and because I'm not hear very much  Physical examination  Vital signs stable she's afebrile  Problem #1 anemia secondary to B12 deficiency........ B12 1 mL monthly forever

## 2016-03-05 ENCOUNTER — Telehealth: Payer: Self-pay | Admitting: Family Medicine

## 2016-03-05 NOTE — Telephone Encounter (Signed)
Pt would like to transition from Dr. Sherren Mocha to Dr. Etter Sjogren Cheri Rous. Will provider accept pt?    CB: [id number]

## 2016-03-05 NOTE — Telephone Encounter (Signed)
yes

## 2016-03-12 ENCOUNTER — Other Ambulatory Visit: Payer: Self-pay | Admitting: Family Medicine

## 2016-03-20 ENCOUNTER — Telehealth: Payer: Self-pay | Admitting: Family Medicine

## 2016-03-20 NOTE — Telephone Encounter (Signed)
Pt stated that the doctor that you referred her to is no longer taking new pts they gave her names of two others and would like to run them by Dr. Sherren Mocha.

## 2016-03-22 NOTE — Telephone Encounter (Signed)
Called and left voicemail for pt to return call to office.  

## 2016-03-27 NOTE — Telephone Encounter (Signed)
Called and left voicemail for pt to return call to office.  

## 2016-04-01 NOTE — Telephone Encounter (Signed)
Called and spoke with pt informing pt that from the notes its looks Dr. Cheri Rous accepting request on 03/05/16. Pt vebalized understanding. Nothing further needed at this time.

## 2016-04-01 NOTE — Telephone Encounter (Signed)
[place] pt returned your call.

## 2016-04-02 ENCOUNTER — Other Ambulatory Visit: Payer: Self-pay | Admitting: Family Medicine

## 2016-04-04 NOTE — Telephone Encounter (Signed)
Called and left message for pt to return call to office. Last visit Dr. Sherren Mocha reccommended pt establish with a new PCP was just calling to clarify and inform pt that new PCP would need to refill further refills.

## 2016-04-05 NOTE — Telephone Encounter (Signed)
Patient scheduled for 06/13/2016 with Dr. Carollee Herter.

## 2016-04-12 ENCOUNTER — Other Ambulatory Visit: Payer: Self-pay | Admitting: Family Medicine

## 2016-04-30 ENCOUNTER — Other Ambulatory Visit: Payer: Self-pay

## 2016-04-30 ENCOUNTER — Other Ambulatory Visit: Payer: Self-pay | Admitting: Family Medicine

## 2016-04-30 MED ORDER — CITALOPRAM HYDROBROMIDE 20 MG PO TABS: ORAL_TABLET | ORAL | 3 refills | Status: DC

## 2016-05-14 ENCOUNTER — Other Ambulatory Visit: Payer: Self-pay | Admitting: Family Medicine

## 2016-05-15 ENCOUNTER — Telehealth: Payer: Self-pay | Admitting: Family Medicine

## 2016-05-16 ENCOUNTER — Other Ambulatory Visit: Payer: Self-pay | Admitting: Family Medicine

## 2016-05-16 NOTE — Telephone Encounter (Signed)
Pharmacy did not receive pt's rx traMADol (ULTRAM) 50 MG tablet  Can you resend? [facility], [address]

## 2016-05-16 NOTE — Telephone Encounter (Signed)
Pt calling at the pharmacy and the tramadol is not there.  I spoke with [place] and she is going to resend the Rx pt is aware.

## 2016-05-17 NOTE — Telephone Encounter (Signed)
Prescription re-faxed to pharmacy.

## 2016-06-08 ENCOUNTER — Other Ambulatory Visit: Payer: Self-pay | Admitting: Family Medicine

## 2016-06-10 ENCOUNTER — Telehealth: Payer: Self-pay | Admitting: Emergency Medicine

## 2016-06-10 NOTE — Telephone Encounter (Signed)
Left message for pt in reagarding pt prescription is ready for pickup

## 2016-06-11 ENCOUNTER — Telehealth: Payer: Self-pay | Admitting: *Deleted

## 2016-06-11 NOTE — Telephone Encounter (Signed)
Scheduled 06/13/16 @2 

## 2016-06-13 ENCOUNTER — Ambulatory Visit (INDEPENDENT_AMBULATORY_CARE_PROVIDER_SITE_OTHER): Payer: Medicare Other | Admitting: Family Medicine

## 2016-06-13 ENCOUNTER — Encounter: Payer: Self-pay | Admitting: Family Medicine

## 2016-06-13 VITALS — BP 126/72 | HR 97 | Temp 97.8°F | Resp 16 | Ht 66.0 in | Wt 191.4 lb

## 2016-06-13 DIAGNOSIS — R32 Unspecified urinary incontinence: Secondary | ICD-10-CM | POA: Diagnosis not present

## 2016-06-13 DIAGNOSIS — K219 Gastro-esophageal reflux disease without esophagitis: Secondary | ICD-10-CM | POA: Diagnosis not present

## 2016-06-13 DIAGNOSIS — Z1231 Encounter for screening mammogram for malignant neoplasm of breast: Secondary | ICD-10-CM

## 2016-06-13 DIAGNOSIS — Z1239 Encounter for other screening for malignant neoplasm of breast: Secondary | ICD-10-CM

## 2016-06-13 DIAGNOSIS — M8580 Other specified disorders of bone density and structure, unspecified site: Secondary | ICD-10-CM

## 2016-06-13 DIAGNOSIS — E2839 Other primary ovarian failure: Secondary | ICD-10-CM

## 2016-06-13 DIAGNOSIS — G2581 Restless legs syndrome: Secondary | ICD-10-CM

## 2016-06-13 DIAGNOSIS — H6691 Otitis media, unspecified, right ear: Secondary | ICD-10-CM

## 2016-06-13 DIAGNOSIS — I10 Essential (primary) hypertension: Secondary | ICD-10-CM

## 2016-06-13 DIAGNOSIS — E538 Deficiency of other specified B group vitamins: Secondary | ICD-10-CM | POA: Diagnosis not present

## 2016-06-13 DIAGNOSIS — J069 Acute upper respiratory infection, unspecified: Secondary | ICD-10-CM

## 2016-06-13 MED ORDER — DOXYCYCLINE HYCLATE 100 MG PO TABS: 100.0000 mg | ORAL_TABLET | Freq: Two times a day (BID) | ORAL | 0 refills | Status: DC

## 2016-06-13 MED ORDER — TRAMADOL HCL 50 MG PO TABS: ORAL_TABLET | ORAL | 1 refills | Status: DC

## 2016-06-13 MED ORDER — ROPINIROLE HCL 1 MG PO TABS: 1.0000 mg | ORAL_TABLET | Freq: Every day | ORAL | 2 refills | Status: DC

## 2016-06-13 MED ORDER — MIRABEGRON ER 50 MG PO TB24: 50.0000 mg | ORAL_TABLET | Freq: Every day | ORAL | 5 refills | Status: DC

## 2016-06-13 MED ORDER — FLUTICASONE PROPIONATE 50 MCG/ACT NA SUSP: 2.0000 | Freq: Every day | NASAL | 6 refills | Status: DC

## 2016-06-13 MED ORDER — PANTOPRAZOLE SODIUM 40 MG PO TBEC: 40.0000 mg | DELAYED_RELEASE_TABLET | Freq: Every day | ORAL | 3 refills | Status: DC

## 2016-06-13 MED ORDER — LEVOCETIRIZINE DIHYDROCHLORIDE 5 MG PO TABS: 5.0000 mg | ORAL_TABLET | Freq: Every evening | ORAL | 5 refills | Status: DC

## 2016-06-13 NOTE — Progress Notes (Signed)
Subjective:   Felicia Cruz is a 76 y.o. female who presents for Medicare Annual (Subsequent) preventive examination.  Review of Systems:  No ROS.  Medicare Wellness Visit. Cardiac Risk Factors include: advanced age (>55men, >45 women);hypertension;dyslipidemia Sleep patterns: Takes lorazepam to sleep. Wakes 1-3 times to urinate. Sleeps at least 8 hrs per night. Feels rested. Home Safety/Smoke Alarms:  Feels safe in home. Smoke alarms in place. Emergency cords in apt. Living environment; residence and Firearm Safety: Lives in a retirement community. No guns. Seat Belt Safety/Bike Helmet: Wears seat belt.   Counseling:   Eye Exam- Dr. Jari Pigg annually. Dental- Dr. Raelyn Ensign every 6 months.   Female:   Pap- No longer screening per pt.       Mammo- Last 06/27/15: normal per pt. Done by [facility] per pt. Will request. Has appt next month. Dexa scan- Pt states she will do with mammogram next month. CCS- Last 06/08/14: Normal.No recall due to age per report.     Objective:     Vitals: BP 126/72 (BP Location: Right Arm, Cuff Size: Normal)   Pulse 97   Temp 97.8 F (36.6 C) (Oral)   Resp 16   Ht 5\' 6"  (1.676 m)   Wt 191 lb 6.4 oz (86.8 kg)   SpO2 97%   BMI 30.89 kg/m   Body mass index is 30.89 kg/m.   Tobacco History  Smoking Status  . Former Smoker  . Packs/day: 1.00  . Years: 10.00  . Types: Cigarettes  . Quit date: 06/04/1971  Smokeless Tobacco  . Never Used     Counseling given: No   Past Medical History:  Diagnosis Date  . Alcoholism ([place])    HAS NOT HAD [place]  . Arthritis   . B12 deficiency   . Cataract   . Chronic back pain   . DDD (degenerative disc disease), lumbar   . Esophageal stricture 2010  . GERD (gastroesophageal reflux disease)   . Hiatal hernia   . Hyperlipemia   . Kidney stones   . Osteopenia   . 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 RLS (restless legs syndrome)   . Substance abuse    RECOVERING ALCOLHOLIC    Past Surgical History:  Procedure Laterality Date  . ABDOMINOPLASTY    . DILATION AND CURETTAGE OF UTERUS  many yrs ago   x 2  . ESOPHAGEAL MANOMETRY  02/17/2012   Procedure: ESOPHAGEAL MANOMETRY (EM);  Surgeon: Pedro Earls, MD;  Location: [facility];  Service: Endoscopy;  Laterality: N/A;  . FACIAL COSMETIC SURGERY     eyes, nose  . JOINT REPLACEMENT     left  . LAPAROSCOPIC NISSEN FUNDOPLICATION  [id number]   Procedure: LAPAROSCOPIC NISSEN FUNDOPLICATION;  Surgeon: Pedro Earls, MD;  Location: [facility];  Service: General;  Laterality: N/A;  Laparoscopic Nissen repair of hiatal hernia  with lighted bougie  . LIPOSUCTION  15 to 20 yrs ago  . [place] SURGERY  2008  . OPEN SURGICAL REPAIR OF GLUTEAL TENDON Right 12/28/2012   Procedure: RIGHT GLUTEAL TENDON REPAIR;  Surgeon: Mauri Pole, MD;  Location: [facility];  Service: Orthopedics;  Laterality: Right;  . RIGHT INDEX FINGER SURGERY  06/19/11   AT [facility]  . SURGERY FOR KIDNEY STONES    . 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   Procedure: TOTAL KNEE ARTHROPLASTY;  Surgeon: Mauri Pole, MD;  Location: [facility];  Service: Orthopedics;  Laterality:  Left;   Family History  Problem Relation Age of Onset  . Throat cancer Father     Smoker  . Heart disease Mother   . Hypertension Mother   . Hip fracture Mother   . Alzheimer's disease Mother   . Stroke Mother   . Stroke Brother 65    smoker   History  Sexual Activity  . Sexual activity: Not on file    Outpatient Encounter Prescriptions as of 06/13/2016  Medication Sig  . albuterol (VENTOLIN HFA) 108 (90 BASE) MCG/ACT inhaler Inhale 2 puffs into the lungs every 4 (four) hours as needed for wheezing or shortness of breath (or cough).  . calcium-vitamin D (CALCIUM 500/D) 500-200 MG-UNIT tablet Take by mouth.  . citalopram (CELEXA) 20 MG tablet 1-1/2 tablets at bedtime  . cyanocobalamin (,VITAMIN B-12,) 1000 MCG/ML injection INJECT 1 ML INTO THE MUSCLE  ONCE A MONTH  . LORazepam (ATIVAN) 1 MG tablet TAKE 1 TABLET BY MOUTH EVERY NIGHT AT BEDTIME  . losartan-hydrochlorothiazide (HYZAAR) 100-12.5 MG tablet TAKE 1 TABLET BY MOUTH EVERY MORNING  . SYRINGE-NEEDLE, DISP, 3 ML (BD ECLIPSE SYRINGE) 25G X 1" 3 ML MISC Inject 78ml monthly of B122  . traMADol (ULTRAM) 50 MG tablet TAKE 1/2 TO 1 TABLET BY MOUTH TWICE DAILY AS NEEDED FOR SEVERE PAIN  . XARELTO 20 MG TABS tablet TAKE 1 TABLET(20 MG) BY MOUTH DAILY WITH DINNER  . [DISCONTINUED] omeprazole (PRILOSEC) 20 MG capsule TAKE 1 CAPSULE(20 MG) BY MOUTH TWICE DAILY  . [DISCONTINUED] oxybutynin (DITROPAN) 5 MG tablet TAKE 1 TABLET BY MOUTH DAILY IN THE MORNING  . [DISCONTINUED] rOPINIRole (REQUIP) 0.5 MG tablet TAKE 1 TABLET BY MOUTH EVERY NIGHT 2 HOURS BEFORE BEDTIME FOR 1 WEEK. INCREASE TO 2 TABLETS  . [DISCONTINUED] traMADol (ULTRAM) 50 MG tablet TAKE 1/2 TO 1 TABLET BY MOUTH TWICE DAILY AS NEEDED FOR SEVERE PAIN  . doxycycline (VIBRA-TABS) 100 MG tablet Take 1 tablet (100 mg total) by mouth 2 (two) times daily.  . fluticasone (FLONASE) 50 MCG/ACT nasal spray Place 2 sprays into both nostrils daily.  Marland Kitchen levocetirizine (XYZAL) 5 MG tablet Take 1 tablet (5 mg total) by mouth every evening.  . mirabegron ER (MYRBETRIQ) 50 MG TB24 tablet Take 1 tablet (50 mg total) by mouth daily.  . pantoprazole (PROTONIX) 40 MG tablet Take 1 tablet (40 mg total) by mouth daily.  Marland Kitchen rOPINIRole (REQUIP) 1 MG tablet Take 1 tablet (1 mg total) by mouth at bedtime.  . [DISCONTINUED] Rivaroxaban (XARELTO) 15 MG TABS tablet Take 15 mg by mouth 2 (two) times daily with a meal.  . [DISCONTINUED] traMADol (ULTRAM) 50 MG tablet TAKE 1/2 TO 1 TABLET BY MOUTH TWICE DAILY AS NEEDED FOR SEVERE PAIN   No facility-administered encounter medications on file as of 06/13/2016.     Activities of Daily Living In your present state of health, do you have any difficulty performing the following activities: 06/13/2016  Hearing? N  Vision? N    Difficulty concentrating or making decisions? N  Walking or climbing stairs? Y  Dressing or bathing? N  Doing errands, shopping? N  Preparing Food and eating ? N  Using the Toilet? N  In the past six months, have you accidently leaked urine? N  Do you have problems with loss of bowel control? N  Managing your Medications? N  Managing your Finances? N  Housekeeping or managing your Housekeeping? N  Some recent data might be hidden    Patient Care Team: Jory Ee  Sherren Mocha, MD as PCP - General Elsie Stain, MD (Pulmonary Disease)    Assessment:    Physical assessment deferred to PCP.  Exercise Activities and Dietary recommendations Current Exercise Habits: Home exercise routine (pilates leg exercises), Time (Minutes): 10, Frequency (Times/Week): 7, Weekly Exercise (Minutes/Week): 70, Intensity: Mild   Diet (meal preparation, eat out, water intake, caffeinated beverages, dairy products, fruits and vegetables): in general, a "healthy" diet  , well balanced  24 HOUR RECALL: Breakfast: Coffee (decaf) Lunch: Soup with rolls. Decaf coffee Dinner: Meat and vegetables. Water.  Drinks 4-5 glasses of water per day.   Goals    . Would like to lose 25lbs      Fall Risk Fall Risk  06/13/2016 06/13/2016 03/04/2016 06/21/2015 12/30/2014  Falls in the past year? Yes Yes Yes Yes No  Number falls in past yr: 2 or more 2 or more 2 or more 2 or more -  Injury with Fall? No No No No -  Risk for fall due to : Impaired balance/gait - - - -  Follow up Education provided;Falls prevention discussed - - - -   Depression Screen PHQ 2/9 Scores 06/13/2016 06/13/2016 03/04/2016 12/30/2014  PHQ - 2 Score 0 0 0 0     Cognitive Function MMSE - Mini Mental State Exam 06/13/2016  Orientation to time 5  Orientation to Place 5  Registration 3  Attention/ Calculation 5  Recall 3  Language- name 2 objects 2  Language- repeat 1  Language- follow 3 step command 3  Language- read & follow direction 1  Write a  sentence 1  Copy design 1  Total score 30        Immunization History  Administered Date(s) Administered  . Influenza Split 03/31/2012  . Influenza Whole 03/09/2009, 03/19/2010  . Influenza, High Dose Seasonal PF 04/07/2013, 03/16/2014, 02/08/2015, 03/04/2016  . Pneumococcal Conjugate-13 09/09/2013  . Pneumococcal Polysaccharide-23 03/10/2008  . Td 06/09/2006  . Zoster 06/26/2006   Screening Tests Health Maintenance  Topic Date Due  . DEXA SCAN  02/06/2006  . TETANUS/TDAP  06/13/2017 (Originally 06/09/2016)  . MAMMOGRAM  06/26/2016  . COLONOSCOPY  06/08/2024  . INFLUENZA VACCINE  Completed  . ZOSTAVAX  Completed  . PNA vac Low Risk Adult  Completed      Plan:     Schedule Bone Density with Mammogram as discussed.  Continue to eat heart healthy diet (full of fruits, vegetables, whole grains, lean protein, water--limit salt, fat, and sugar intake) and increase physical activity as tolerated. Continue doing brain stimulating activities (puzzles, reading, adult coloring books, staying active) to keep memory sharp.   During the course of the visit the patient was educated and counseled about the following appropriate screening and preventive services:   Vaccines to include Pneumoccal, Influenza, Hepatitis B, Td, Zostavax, HCV  Cardiovascular Disease  Colorectal cancer screening  Bone density screening  Diabetes screening  Glaucoma screening  Mammography/PAP  Nutrition counseling   Patient Instructions (the written plan) was given to the patient.   Shela Nevin, [place]  06/13/2016

## 2016-06-13 NOTE — Progress Notes (Signed)
Pre visit review using our clinic review tool, if applicable. No additional management support is needed unless otherwise documented below in the visit note. 

## 2016-06-13 NOTE — Patient Instructions (Addendum)
Schedule Bone Density with Mammogram as discussed.  Continue to eat heart healthy diet (full of fruits, vegetables, whole grains, lean protein, water--limit salt, fat, and sugar intake) and increase physical activity as tolerated. Continue doing brain stimulating activities (puzzles, reading, adult coloring books, staying active) to keep memory sharp.  Restless Legs Syndrome Introduction Restless legs syndrome is a condition that causes uncomfortable feelings or sensations in the legs, especially while sitting or lying down. The sensations usually cause an overwhelming urge to move the legs. The arms can also sometimes be affected. The condition can range from mild to severe. The symptoms often interfere with a person's ability to sleep. What are the causes? The cause of this condition is not known. What increases the risk? This condition is more likely to develop in:  People who are older than age 76.  Pregnant women. In general, restless legs syndrome is more common in women than in men.  People who have a family history of the condition.  People who have certain medical conditions, such as iron deficiency, kidney disease, Parkinson disease, or nerve damage.  People who take certain medicines, such as medicines for high blood pressure, nausea, colds, allergies, depression, and some heart conditions. What are the signs or symptoms? The main symptom of this condition is uncomfortable sensations in the legs. These sensations may be:  Described as pulling, tingling, prickling, throbbing, crawling, or burning.  Worse while you are sitting or lying down.  Worse during periods of rest or inactivity.  Worse at night, often interfering with your sleep.  Accompanied by a very strong urge to move your legs.  Temporarily relieved by movement of your legs. The sensations usually affect both sides of the body. The arms can also be affected, but this is rare. People who have this condition  often have tiredness during the day because of their lack of sleep at night. How is this diagnosed? This condition may be diagnosed based on your description of the symptoms. You may also have tests, including blood tests, to check for other conditions that may lead to your symptoms. In some cases, you may be asked to spend some time in a sleep lab so your sleeping can be monitored. How is this treated? Treatment for this condition is focused on managing the symptoms. Treatment may include:  Self-help and lifestyle changes.  Medicines. Follow these instructions at home:  Take medicines only as directed by your health care provider.  Try these methods to get temporary relief from the uncomfortable sensations:  Massage your legs.  Walk or stretch.  Take a cold or hot bath.  Practice good sleep habits. For example, go to bed and get up at the same time every day.  Exercise regularly.  Practice ways of relaxing, such as yoga or meditation.  Avoid caffeine and alcohol.  Do not use any tobacco products, including cigarettes, chewing tobacco, or electronic cigarettes. If you need help quitting, ask your health care provider.  Keep all follow-up visits as directed by your health care provider. This is important. Contact a health care provider if: Your symptoms do not improve with treatment, or they get worse. This information is not intended to replace advice given to you by your health care provider. Make sure you discuss any questions you have with your health care provider. Document Released: 05/10/2002 Document Revised: 10/26/2015 Document Reviewed: 05/16/2014  2017 Elsevier

## 2016-06-13 NOTE — Progress Notes (Signed)
Patient ID: Felicia Cruz, female    DOB: November 14, 1940  Age: 76 y.o. MRN: [id number]    Subjective:  Subjective  HPI Felicia Cruz presents to establish care   Review of Systems  Constitutional: Positive for fatigue. Negative for activity change, appetite change and unexpected weight change.  Respiratory: Negative for cough and shortness of breath.   Cardiovascular: Negative for chest pain and palpitations.  Psychiatric/Behavioral: Negative for behavioral problems and dysphoric mood. The patient is not nervous/anxious.     History Past Medical History:  Diagnosis Date  . Alcoholism ([place])    HAS NOT HAD [place]  . Arthritis   . B12 deficiency   . Cataract   . Chronic back pain   . DDD (degenerative disc disease), lumbar   . Esophageal stricture 2010  . GERD (gastroesophageal reflux disease)   . Hiatal hernia   . Hyperlipemia   . Kidney stones   . Osteopenia   . 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 RLS (restless legs syndrome)   . Substance abuse    RECOVERING ALCOLHOLIC    She has a past surgical history that includes Lumbar disc surgery (2008); Facial cosmetic surgery; Abdominoplasty; Liposuction (15 to 20 yrs ago); SURGERY FOR KIDNEY STONES; RIGHT INDEX FINGER SURGERY (06/19/11); Laparoscopic Nissen fundoplication ([id number]); Esophageal manometry (02/17/2012); Dilation and curettage of uterus (many yrs ago); Total knee arthroplasty (Left, 08/10/2012); Joint replacement; and Open surgical repair of gluteal tendon (Right, 12/28/2012).   Her family history includes Alzheimer's disease in her mother; Heart disease in her mother; Hip fracture in her mother; Hypertension in her mother; Stroke in her mother; Stroke (age of onset: 46) in her brother; Throat cancer in her father.She reports that she quit smoking about 45 years ago. Her smoking use included Cigarettes. She has a 10.00 pack-year smoking history. She has never used smokeless tobacco. She reports that  she does not drink alcohol or use drugs.  Current Outpatient Prescriptions on File Prior to Visit  Medication Sig Dispense Refill  . albuterol (VENTOLIN HFA) 108 (90 BASE) MCG/ACT inhaler Inhale 2 puffs into the lungs every 4 (four) hours as needed for wheezing or shortness of breath (or cough). 1 Inhaler 0  . calcium-vitamin D (CALCIUM 500/D) 500-200 MG-UNIT tablet Take by mouth.    . citalopram (CELEXA) 20 MG tablet 1-1/2 tablets at bedtime 150 tablet 3  . LORazepam (ATIVAN) 1 MG tablet TAKE 1 TABLET BY MOUTH EVERY NIGHT AT BEDTIME 90 tablet 3  . losartan-hydrochlorothiazide (HYZAAR) 100-12.5 MG tablet TAKE 1 TABLET BY MOUTH EVERY MORNING 30 tablet 3  . SYRINGE-NEEDLE, DISP, 3 ML (BD ECLIPSE SYRINGE) 25G X 1" 3 ML MISC Inject 86ml monthly of B122 30 each 0  . XARELTO 20 MG TABS tablet TAKE 1 TABLET(20 MG) BY MOUTH DAILY WITH DINNER 30 tablet 5   No current facility-administered medications on file prior to visit.      Objective:  Objective  Physical Exam  Constitutional: She is oriented to person, place, and time. She appears well-developed and well-nourished. No distress.  HENT:  Head: Normocephalic and atraumatic.  Right Ear: External ear normal.  Left Ear: External ear normal.  Nose: Nose normal.  Mouth/Throat: Oropharynx is clear and moist.  Eyes: Conjunctivae and EOM are normal. Pupils are equal, round, and reactive to light.  Neck: Normal range of motion. Neck supple. No JVD present. Carotid bruit is not present. No thyromegaly present.  Cardiovascular: Normal rate, regular rhythm  and normal heart sounds.   No murmur heard. Pulmonary/Chest: Effort normal and breath sounds normal. No respiratory distress. She has no wheezes. She has no rales. She exhibits no tenderness.  Abdominal: Soft. Bowel sounds are normal. She exhibits no distension and no mass. There is no tenderness. There is no rebound and no guarding.  Musculoskeletal: Normal range of motion. She exhibits no edema or  tenderness.  Neurological: She is alert and oriented to person, place, and time. She displays normal reflexes. No cranial nerve deficit. Coordination normal.  Skin: Skin is warm and dry. No rash noted. No erythema. No pallor.  Psychiatric: She has a normal mood and affect. Her behavior is normal. Judgment and thought content normal.  Nursing note and vitals reviewed.  BP 126/72 (BP Location: Right Arm, Cuff Size: Normal)   Pulse 97   Temp 97.8 F (36.6 C) (Oral)   Resp 16   Ht 5\' 6"  (1.676 m)   Wt 191 lb 6.4 oz (86.8 kg)   SpO2 97%   BMI 30.89 kg/m  Wt Readings from Last 3 Encounters:  06/13/16 191 lb 6.4 oz (86.8 kg)  03/04/16 187 lb 9.6 oz (85.1 kg)  01/05/16 187 lb 6.4 oz (85 kg)     Lab Results  Component Value Date   WBC 6.4 01/05/2016   HGB 12.0 01/05/2016   HCT 36.0 01/05/2016   PLT 398.0 01/05/2016   GLUCOSE 103 (H) 01/05/2016   CHOL 244 (H) 09/09/2013   TRIG 109.0 09/09/2013   HDL 92.30 09/09/2013   LDLDIRECT 144.6 09/10/2011   LDLCALC 130 (H) 09/09/2013   ALT 13 12/30/2014   AST 17 12/30/2014   NA 140 01/05/2016   K 4.6 01/05/2016   CL 105 01/05/2016   CREATININE 0.88 01/05/2016   BUN 19 01/05/2016   CO2 25 01/05/2016   TSH 0.36 09/09/2013   INR 1.27 10/18/2013   HGBA1C 5.7 06/02/2006    Dg Tibia/fibula Right  Result Date: 08/15/2015 CLINICAL DATA:  Fall at home in December. Pain in right lower extremity. EXAM: RIGHT TIBIA AND FIBULA - 2 VIEW COMPARISON:  None. FINDINGS: No acute bony abnormality. Specifically, no fracture, subluxation, or dislocation. Soft tissues are intact. IMPRESSION: No acute bony abnormality. Electronically Signed   By: Rolm Baptise M.D.   On: 08/15/2015 11:04   Dg Knee Complete 4 Views Right  Result Date: 08/15/2015 CLINICAL DATA:  Fall on right knee in December.  Persistent pain EXAM: RIGHT KNEE - COMPLETE 4+ VIEW COMPARISON:  None. FINDINGS: Mild degenerative changes with joint space narrowing and spurring, best seen in the  medial and patellofemoral compartments. No acute bony abnormality. Specifically, no fracture, subluxation, or dislocation. Soft tissues are intact. No joint effusion IMPRESSION: Degenerative changes.  No acute bony abnormality. Electronically Signed   By: Rolm Baptise M.D.   On: 08/15/2015 11:07   Dg Femur, Min 2 Views Right  Result Date: 08/15/2015 CLINICAL DATA:  Right leg pain. Fall on right side in December. Remote history of right hip surgery. EXAM: RIGHT FEMUR 2 VIEWS COMPARISON:  None. FINDINGS: Screws are noted within the greater trochanter region of the right proximal femur. Mild degenerative changes within the hip with joint space narrowing and spurring. No acute bony abnormality. Specifically, no fracture, subluxation, or dislocation. Soft tissues are intact. IMPRESSION: No acute bony abnormality. Electronically Signed   By: Rolm Baptise M.D.   On: 08/15/2015 11:07     Assessment & Plan:  Plan  I have discontinued Ms.  Persley's Rivaroxaban, oxybutynin, rOPINIRole, and omeprazole. I am also having her start on mirabegron ER, pantoprazole, rOPINIRole, doxycycline, fluticasone, and levocetirizine. Additionally, I am having her maintain her albuterol, losartan-hydrochlorothiazide, calcium-vitamin D, SYRINGE-NEEDLE (DISP) 3 ML, XARELTO, citalopram, LORazepam, cyanocobalamin, and traMADol.  Meds ordered this encounter  Medications  . cyanocobalamin (,VITAMIN B-12,) 1000 MCG/ML injection    Sig: INJECT 1 ML INTO THE MUSCLE ONCE A MONTH    Refill:  0  . mirabegron ER (MYRBETRIQ) 50 MG TB24 tablet    Sig: Take 1 tablet (50 mg total) by mouth daily.    Dispense:  30 tablet    Refill:  5  . pantoprazole (PROTONIX) 40 MG tablet    Sig: Take 1 tablet (40 mg total) by mouth daily.    Dispense:  30 tablet    Refill:  3  . traMADol (ULTRAM) 50 MG tablet    Sig: TAKE 1/2 TO 1 TABLET BY MOUTH TWICE DAILY AS NEEDED FOR SEVERE PAIN    Dispense:  60 tablet    Refill:  1  . rOPINIRole (REQUIP) 1 MG  tablet    Sig: Take 1 tablet (1 mg total) by mouth at bedtime.    Dispense:  30 tablet    Refill:  2  . doxycycline (VIBRA-TABS) 100 MG tablet    Sig: Take 1 tablet (100 mg total) by mouth 2 (two) times daily.    Dispense:  20 tablet    Refill:  0  . fluticasone (FLONASE) 50 MCG/ACT nasal spray    Sig: Place 2 sprays into both nostrils daily.    Dispense:  16 g    Refill:  6  . levocetirizine (XYZAL) 5 MG tablet    Sig: Take 1 tablet (5 mg total) by mouth every evening.    Dispense:  30 tablet    Refill:  5    Problem List Items Addressed This Visit      Unprioritized   Urinary incontinence - Primary   Relevant Medications   mirabegron ER (MYRBETRIQ) 50 MG TB24 tablet   GERD (gastroesophageal reflux disease)   Relevant Medications   pantoprazole (PROTONIX) 40 MG tablet   Osteopenia   Relevant Orders   DG Bone Density    Other Visit Diagnoses    Restless leg       Relevant Medications   traMADol (ULTRAM) 50 MG tablet   rOPINIRole (REQUIP) 1 MG tablet   Vitamin B 12 deficiency       Relevant Orders   Lipid panel   CBC with Differential/Platelet   Vitamin B12   POCT urinalysis dipstick   Essential hypertension       Relevant Orders   Lipid panel   CBC with Differential/Platelet   Vitamin B12   POCT urinalysis dipstick   Right otitis media, unspecified otitis media type       Relevant Medications   doxycycline (VIBRA-TABS) 100 MG tablet   Acute upper respiratory infection       Relevant Medications   fluticasone (FLONASE) 50 MCG/ACT nasal spray   levocetirizine (XYZAL) 5 MG tablet   Screening for breast cancer       Relevant Orders   MM SCREENING BREAST TOMO BILATERAL   Estrogen deficiency       Relevant Orders   DG Bone Density      Follow-up: Return in about 3 months (around 09/11/2016) for annual exam, fasting.  Ann Held, DO

## 2016-06-17 ENCOUNTER — Telehealth: Payer: Self-pay | Admitting: Family Medicine

## 2016-06-17 ENCOUNTER — Other Ambulatory Visit: Payer: Self-pay | Admitting: Family Medicine

## 2016-06-17 MED ORDER — LOSARTAN POTASSIUM-HCTZ 100-12.5 MG PO TABS: 1.0000 | ORAL_TABLET | Freq: Every morning | ORAL | 3 refills | Status: DC

## 2016-06-17 NOTE — Telephone Encounter (Signed)
Ativan was sent to pharmacy 06/10/13.  Hyzaar was sent to pharm. Today.  PC

## 2016-06-17 NOTE — Telephone Encounter (Signed)
Relation to [id number] Call back [place]: [facility] [place], [address] [phone] (Phone) [phone] (Fax)     Reason for call:  Patient requesting a refill LORazepam (ATIVAN) 1 MG tablet, losartan-hydrochlorothiazide (HYZAAR) 100-12.5 MG tablet  XARELTO 20 MG TABS tablet

## 2016-06-22 ENCOUNTER — Other Ambulatory Visit: Payer: Self-pay | Admitting: Family Medicine

## 2016-06-24 MED ORDER — LORAZEPAM 1 MG PO TABS: 1.0000 mg | ORAL_TABLET | Freq: Every day | ORAL | 0 refills | Status: DC

## 2016-06-24 NOTE — Telephone Encounter (Signed)
Water quality scientist for lorazepam/PCP signed/faxed to [facility] in [place]. Patient contacted and informed hardcopy faxed.

## 2016-06-24 NOTE — Addendum Note (Signed)
Addended by: Sharon Seller B on: 06/24/2016 10:48 AM   Modules accepted: Orders

## 2016-06-24 NOTE — Telephone Encounter (Signed)
Appears lorazepam was sent in on 06/10/2016 by Dr. Sherren Mocha.  Looks like she was called to pickup hardcopy at the [place] office on 06/10/2016--although this medication can be faxed does not need to be picked up. So Advise if ok to refill as patient states she did not get as no longer sees Dr. Sherren Mocha

## 2016-06-24 NOTE — Telephone Encounter (Signed)
Ok to refill x 1  

## 2016-06-24 NOTE — Telephone Encounter (Signed)
Patient called stating that her Lorazepam was not sent in to the pharmacy. She states that Dr. Sherren Mocha did not send the prescription in because she is no longer seeing him. Please advise.   Phone: [phone]  Pharmacy: [facility], [address] AT [place]

## 2016-07-08 DIAGNOSIS — M85852 Other specified disorders of bone density and structure, left thigh: Secondary | ICD-10-CM | POA: Diagnosis not present

## 2016-07-08 DIAGNOSIS — Z1231 Encounter for screening mammogram for malignant neoplasm of breast: Secondary | ICD-10-CM | POA: Diagnosis not present

## 2016-07-08 DIAGNOSIS — M810 Age-related osteoporosis without current pathological fracture: Secondary | ICD-10-CM | POA: Diagnosis not present

## 2016-07-08 LAB — HM MAMMOGRAPHY

## 2016-07-08 LAB — HM DEXA SCAN

## 2016-08-20 ENCOUNTER — Telehealth: Payer: Self-pay

## 2016-08-20 NOTE — Telephone Encounter (Signed)
Spoke with pt about the interaction of the medication Celexa and Tramadol. Patient state she does not know if Celexa really work but the Tramadol does help with restless leg syndrome. Provider advised pt to taper off celexa, start with 1/2 tablet, and increase requip to 1.5 mg. Patient will follow up with in 2-3 weeks. Patient had no questions or concerns.

## 2016-08-26 ENCOUNTER — Encounter: Payer: Self-pay | Admitting: *Deleted

## 2016-08-26 ENCOUNTER — Telehealth: Payer: Self-pay | Admitting: *Deleted

## 2016-08-26 NOTE — Telephone Encounter (Signed)
Patient notified about bone density results.  She wants to talk with Dr. Etter Sjogren before she starts Prolia.

## 2016-09-13 ENCOUNTER — Other Ambulatory Visit: Payer: Self-pay | Admitting: Family Medicine

## 2016-09-13 DIAGNOSIS — R350 Frequency of micturition: Secondary | ICD-10-CM | POA: Diagnosis not present

## 2016-09-13 DIAGNOSIS — R3915 Urgency of urination: Secondary | ICD-10-CM | POA: Diagnosis not present

## 2016-09-13 NOTE — Telephone Encounter (Signed)
Requesting:   Lorazepam Contract   None UDS    NOne Last OV    06/13/2016-----future appt is on 10/08/2016 Last Refill    #90 no refills on 06/24/16  Please Advise

## 2016-09-17 ENCOUNTER — Telehealth: Payer: Self-pay | Admitting: Family Medicine

## 2016-09-17 ENCOUNTER — Other Ambulatory Visit: Payer: Self-pay | Admitting: Family Medicine

## 2016-09-17 DIAGNOSIS — G2581 Restless legs syndrome: Secondary | ICD-10-CM

## 2016-09-17 MED ORDER — LORAZEPAM 1 MG PO TABS: 1.0000 mg | ORAL_TABLET | Freq: Every day | ORAL | 0 refills | Status: DC

## 2016-09-17 NOTE — Telephone Encounter (Signed)
Ok to send #90 no refills

## 2016-09-17 NOTE — Telephone Encounter (Signed)
Requesting:    Lorazepam Contract    None UDS    None Last OV     06/13/2016------10/08/2016 Last Refill     #90 no refills on 06/24/2016  Please Advise

## 2016-09-17 NOTE — Telephone Encounter (Signed)
Phone in refill to [facility] in [place] Was ok'd by Debbrah Alar NP

## 2016-09-18 ENCOUNTER — Other Ambulatory Visit: Payer: Self-pay | Admitting: Family Medicine

## 2016-10-08 ENCOUNTER — Encounter: Payer: Self-pay | Admitting: Family Medicine

## 2016-10-08 ENCOUNTER — Ambulatory Visit (INDEPENDENT_AMBULATORY_CARE_PROVIDER_SITE_OTHER): Payer: Medicare Other | Admitting: Family Medicine

## 2016-10-08 VITALS — BP 122/68 | HR 105 | Temp 97.9°F | Resp 16 | Ht 66.0 in | Wt 192.6 lb

## 2016-10-08 DIAGNOSIS — E538 Deficiency of other specified B group vitamins: Secondary | ICD-10-CM

## 2016-10-08 DIAGNOSIS — I10 Essential (primary) hypertension: Secondary | ICD-10-CM

## 2016-10-08 DIAGNOSIS — E785 Hyperlipidemia, unspecified: Secondary | ICD-10-CM | POA: Diagnosis not present

## 2016-10-08 NOTE — Patient Instructions (Signed)

## 2016-10-08 NOTE — Assessment & Plan Note (Signed)
Tolerating statin, encouraged heart healthy diet, avoid trans fats, minimize simple carbs and saturated fats. Increase exercise as tolerated 

## 2016-10-08 NOTE — Assessment & Plan Note (Signed)
Well controlled, no changes to meds. Encouraged heart healthy diet such as the DASH diet and exercise as tolerated.  °

## 2016-10-08 NOTE — Progress Notes (Signed)
Patient ID: Felicia Cruz, female   DOB: 11-23-1940, 76 y.o.   MRN: [id number]     Subjective:  I acted as a Education administrator for Dr. Carollee Herter.  Guerry Bruin, [place]   Patient ID: Felicia Cruz, female    DOB: 05-14-1941, 76 y.o.   MRN: [id number]  Chief Complaint  Patient presents with  . Hypertension  . Hyperlipidemia    HPI Patient is in today for follow up blood pressure and cholesterol. Has been doing well on current treatment.   Past Medical History:  Diagnosis Date  . Alcoholism ([place])    HAS NOT HAD [facility]  . Arthritis   . B12 deficiency   . Cataract   . Chronic back pain   . DDD (degenerative disc disease), lumbar   . Esophageal stricture 2010  . GERD (gastroesophageal reflux disease)   . Hiatal hernia   . Hyperlipemia   . Kidney stones   . Osteopenia   . 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 RLS (restless legs syndrome)   . Substance abuse    RECOVERING ALCOLHOLIC    Past Surgical History:  Procedure Laterality Date  . ABDOMINOPLASTY    . DILATION AND CURETTAGE OF UTERUS  many yrs ago   x 2  . ESOPHAGEAL MANOMETRY  02/17/2012   Procedure: ESOPHAGEAL MANOMETRY (EM);  Surgeon: Pedro Earls, MD;  Location: [facility];  Service: Endoscopy;  Laterality: N/A;  . FACIAL COSMETIC SURGERY     eyes, nose  . JOINT REPLACEMENT     left  . LAPAROSCOPIC NISSEN FUNDOPLICATION  7/59/1638   Procedure: LAPAROSCOPIC NISSEN FUNDOPLICATION;  Surgeon: Pedro Earls, MD;  Location: [facility];  Service: General;  Laterality: N/A;  Laparoscopic Nissen repair of hiatal hernia  with lighted bougie  . LIPOSUCTION  15 to 20 yrs ago  . [place] SURGERY  2008  . OPEN SURGICAL REPAIR OF GLUTEAL TENDON Right 12/28/2012   Procedure: RIGHT GLUTEAL TENDON REPAIR;  Surgeon: Mauri Pole, MD;  Location: [facility];  Service: Orthopedics;  Laterality: Right;  . RIGHT INDEX FINGER SURGERY  06/19/11   AT [facility]  . SURGERY FOR KIDNEY STONES      . 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   Procedure: TOTAL KNEE ARTHROPLASTY;  Surgeon: Mauri Pole, MD;  Location: [facility];  Service: Orthopedics;  Laterality: Left;    Family History  Problem Relation Age of Onset  . Throat cancer Father     Smoker  . Heart disease Mother   . Hypertension Mother   . Hip fracture Mother   . Alzheimer's disease Mother   . Stroke Mother   . Stroke Brother 14    smoker    Social History   Social History  . Marital status: Widowed    Spouse name: N/A  . Number of children: 0  . Years of education: N/A   Occupational History  . Retired    Social History Main Topics  . Smoking status: Former Smoker    Packs/day: 1.00    Years: 10.00    Types: Cigarettes    Quit date: 06/04/1971  . Smokeless tobacco: Never Used  . Alcohol use No     Comment: RECOVERING ALCOLHOLIC  . Drug use: No  . Sexual activity: Not on file   Other Topics Concern  . Not on file   Social History Narrative   Regular exercise-YES  Outpatient Medications Prior to Visit  Medication Sig Dispense Refill  . albuterol (VENTOLIN HFA) 108 (90 BASE) MCG/ACT inhaler Inhale 2 puffs into the lungs every 4 (four) hours as needed for wheezing or shortness of breath (or cough). 1 Inhaler 0  . calcium-vitamin D (CALCIUM 500/D) 500-200 MG-UNIT tablet Take by mouth.    . citalopram (CELEXA) 20 MG tablet 1-1/2 tablets at bedtime 150 tablet 3  . cyanocobalamin (,VITAMIN B-12,) 1000 MCG/ML injection INJECT 1 ML INTO THE MUSCLE ONCE A MONTH  0  . fluticasone (FLONASE) 50 MCG/ACT nasal spray Place 2 sprays into both nostrils daily. 16 g 6  . levocetirizine (XYZAL) 5 MG tablet Take 1 tablet (5 mg total) by mouth every evening. 30 tablet 5  . LORazepam (ATIVAN) 1 MG tablet Take 1 tablet (1 mg total) by mouth at bedtime. 90 tablet 0  . losartan-hydrochlorothiazide (HYZAAR) 100-12.5 MG tablet Take 1 tablet by mouth every morning. 30 tablet 3  . pantoprazole (PROTONIX) 40 MG tablet Take 1  tablet (40 mg total) by mouth daily. 30 tablet 3  . rOPINIRole (REQUIP) 1 MG tablet TAKE 1 TABLET(1 MG) BY MOUTH AT BEDTIME 30 tablet 0  . SYRINGE-NEEDLE, DISP, 3 ML (BD ECLIPSE SYRINGE) 25G X 1" 3 ML MISC Inject 43ml monthly of B122 30 each 0  . traMADol (ULTRAM) 50 MG tablet TAKE 1/2 TO 1 TABLET BY MOUTH TWICE DAILY AS NEEDED FOR SEVERE PAIN 60 tablet 1  . XARELTO 20 MG TABS tablet TAKE 1 TABLET(20 MG) BY MOUTH DAILY WITH DINNER 30 tablet 0  . mirabegron ER (MYRBETRIQ) 50 MG TB24 tablet Take 1 tablet (50 mg total) by mouth daily. 30 tablet 5  . doxycycline (VIBRA-TABS) 100 MG tablet Take 1 tablet (100 mg total) by mouth 2 (two) times daily. 20 tablet 0   No facility-administered medications prior to visit.     Allergies  Allergen Reactions  . Gabapentin Other (See Comments)    Chest pain  . Lipitor [Atorvastatin]     Memory loss, confusion, hallucinations  . Lyrica [Pregabalin] Other (See Comments)    Water blisters on face  . Penicillins Hives, Shortness Of Breath and Itching  . Acyclovir And Related   . Latex Other (See Comments)    Skin peeling (thumb)  . Methocarbamol Other (See Comments)    Blisters on face    Review of Systems  Constitutional: Negative for fever and malaise/fatigue.  HENT: Negative for congestion.   Eyes: Negative for blurred vision.  Respiratory: Negative for cough and shortness of breath.   Cardiovascular: Negative for chest pain, palpitations and leg swelling.  Gastrointestinal: Negative for vomiting.  Musculoskeletal: Negative for back pain.  Skin: Negative for rash.  Neurological: Negative for loss of consciousness and headaches.       Objective:    Physical Exam  Constitutional: She is oriented to person, place, and time. She appears well-developed and well-nourished. No distress.  HENT:  Head: Normocephalic and atraumatic.  Eyes: Conjunctivae are normal.  Neck: Normal range of motion. No thyromegaly present.  Cardiovascular: Normal rate  and regular rhythm.   Pulmonary/Chest: Effort normal and breath sounds normal. She has no wheezes.  Abdominal: Soft. Bowel sounds are normal. There is no tenderness.  Musculoskeletal: Normal range of motion. She exhibits no edema or deformity.  Neurological: She is alert and oriented to person, place, and time.  Skin: Skin is warm and dry. She is not diaphoretic.  Psychiatric: She has a normal mood and affect.  BP 122/68 (BP Location: Right Arm, Cuff Size: Normal)   Pulse (!) 105   Temp 97.9 F (36.6 C) (Oral)   Resp 16   Ht 5\' 6"  (1.676 m)   Wt 192 lb 9.6 oz (87.4 kg)   SpO2 96%   BMI 31.09 kg/m  Wt Readings from Last 3 Encounters:  10/08/16 192 lb 9.6 oz (87.4 kg)  06/13/16 191 lb 6.4 oz (86.8 kg)  03/04/16 187 lb 9.6 oz (85.1 kg)     Lab Results  Component Value Date   WBC 6.4 01/05/2016   HGB 12.0 01/05/2016   HCT 36.0 01/05/2016   PLT 398.0 01/05/2016   GLUCOSE 103 (H) 01/05/2016   CHOL 244 (H) 09/09/2013   TRIG 109.0 09/09/2013   HDL 92.30 09/09/2013   LDLDIRECT 144.6 09/10/2011   LDLCALC 130 (H) 09/09/2013   ALT 13 12/30/2014   AST 17 12/30/2014   NA 140 01/05/2016   K 4.6 01/05/2016   CL 105 01/05/2016   CREATININE 0.88 01/05/2016   BUN 19 01/05/2016   CO2 25 01/05/2016   TSH 0.36 09/09/2013   INR 1.27 10/18/2013   HGBA1C 5.7 06/02/2006    Lab Results  Component Value Date   TSH 0.36 09/09/2013   Lab Results  Component Value Date   WBC 6.4 01/05/2016   HGB 12.0 01/05/2016   HCT 36.0 01/05/2016   MCV 83.0 01/05/2016   PLT 398.0 01/05/2016   Lab Results  Component Value Date   NA 140 01/05/2016   K 4.6 01/05/2016   CO2 25 01/05/2016   GLUCOSE 103 (H) 01/05/2016   BUN 19 01/05/2016   CREATININE 0.88 01/05/2016   BILITOT 0.6 12/30/2014   ALKPHOS 83 12/30/2014   AST 17 12/30/2014   ALT 13 12/30/2014   PROT 7.4 12/30/2014   ALBUMIN 4.3 12/30/2014   CALCIUM 9.8 01/05/2016   GFR 66.60 01/05/2016   Lab Results  Component Value Date    CHOL 244 (H) 09/09/2013   Lab Results  Component Value Date   HDL 92.30 09/09/2013   Lab Results  Component Value Date   LDLCALC 130 (H) 09/09/2013   Lab Results  Component Value Date   TRIG 109.0 09/09/2013   Lab Results  Component Value Date   CHOLHDL 3 09/09/2013   Lab Results  Component Value Date   HGBA1C 5.7 06/02/2006       Assessment & Plan:   Problem List Items Addressed This Visit      Unprioritized   Essential hypertension, benign    Well controlled, no changes to meds. Encouraged heart healthy diet such as the DASH diet and exercise as tolerated.       Hyperlipidemia LDL goal <100    Tolerating statin, encouraged heart healthy diet, avoid trans fats, minimize simple carbs and saturated fats. Increase exercise as tolerated       Other Visit Diagnoses    Essential hypertension    -  Primary   Relevant Orders   Comprehensive metabolic panel   CBC with Differential/Platelet   Lipid panel   Vitamin B12   Vitamin B 12 deficiency       Relevant Orders   Vitamin B12      I have discontinued Ms. Mincy's mirabegron ER and doxycycline. I am also having her maintain her albuterol, calcium-vitamin D, SYRINGE-NEEDLE (DISP) 3 ML, citalopram, cyanocobalamin, pantoprazole, traMADol, fluticasone, levocetirizine, losartan-hydrochlorothiazide, LORazepam, rOPINIRole, XARELTO, and Solifenacin Succinate (VESICARE PO).  Meds ordered this encounter  Medications  .  Solifenacin Succinate (VESICARE PO)    Sig: Take 1 tablet by mouth daily.    CMA served as Education administrator during this visit. History, Physical and Plan performed by medical provider. Documentation and orders reviewed and attested to.  Ann Held, DO

## 2016-10-08 NOTE — Progress Notes (Signed)
Pre visit review using our clinic review tool, if applicable. No additional management support is needed unless otherwise documented below in the visit note. 

## 2016-10-09 ENCOUNTER — Other Ambulatory Visit: Payer: Self-pay | Admitting: Family Medicine

## 2016-10-11 ENCOUNTER — Other Ambulatory Visit (INDEPENDENT_AMBULATORY_CARE_PROVIDER_SITE_OTHER): Payer: Medicare Other

## 2016-10-11 DIAGNOSIS — E538 Deficiency of other specified B group vitamins: Secondary | ICD-10-CM | POA: Diagnosis not present

## 2016-10-11 DIAGNOSIS — I10 Essential (primary) hypertension: Secondary | ICD-10-CM | POA: Diagnosis not present

## 2016-10-11 LAB — CBC WITH DIFFERENTIAL/PLATELET
Basophils Absolute: 0.1 10*3/uL (ref 0.0–0.1)
Basophils Relative: 0.7 % (ref 0.0–3.0)
Eosinophils Absolute: 0.2 10*3/uL (ref 0.0–0.7)
Eosinophils Relative: 2.4 % (ref 0.0–5.0)
HCT: 36.4 % (ref 36.0–46.0)
Hemoglobin: 12 g/dL (ref 12.0–15.0)
Lymphocytes Relative: 19.3 % (ref 12.0–46.0)
Lymphs Abs: 1.6 10*3/uL (ref 0.7–4.0)
MCHC: 33 g/dL (ref 30.0–36.0)
MCV: 84.6 fl (ref 78.0–100.0)
Monocytes Absolute: 0.7 10*3/uL (ref 0.1–1.0)
Monocytes Relative: 8.8 % (ref 3.0–12.0)
Neutro Abs: 5.6 10*3/uL (ref 1.4–7.7)
Neutrophils Relative %: 68.8 % (ref 43.0–77.0)
Platelets: 384 10*3/uL (ref 150.0–400.0)
RBC: 4.31 Mil/uL (ref 3.87–5.11)
RDW: 15.4 % (ref 11.5–15.5)
WBC: 8.1 10*3/uL (ref 4.0–10.5)

## 2016-10-11 LAB — COMPREHENSIVE METABOLIC PANEL
ALT: 11 U/L (ref 0–35)
AST: 14 U/L (ref 0–37)
Albumin: 4.4 g/dL (ref 3.5–5.2)
Alkaline Phosphatase: 54 U/L (ref 39–117)
BUN: 12 mg/dL (ref 6–23)
CHLORIDE: 104 meq/L (ref 96–112)
CO2: 25 meq/L (ref 19–32)
CREATININE: 0.86 mg/dL (ref 0.40–1.20)
Calcium: 9.8 mg/dL (ref 8.4–10.5)
GFR: 68.25 mL/min (ref >60.00)
GLUCOSE: 90 mg/dL (ref 70–99)
POTASSIUM: 4.3 meq/L (ref 3.5–5.1)
SODIUM: 138 meq/L (ref 135–145)
Total Bilirubin: 0.5 mg/dL (ref 0.2–1.2)
Total Protein: 7.2 g/dL (ref 6.0–8.3)

## 2016-10-11 LAB — LIPID PANEL
Cholesterol: 212 mg/dL — ABNORMAL HIGH (ref 0–200)
HDL: 72.4 mg/dL (ref >39.00)
LDL Cholesterol: 111 mg/dL — ABNORMAL HIGH (ref 0–99)
NonHDL: 139.86
Total CHOL/HDL Ratio: 3
Triglycerides: 143 mg/dL (ref 0.0–149.0)
VLDL: 28.6 mg/dL (ref 0.0–40.0)

## 2016-10-11 LAB — VITAMIN B12: Vitamin B-12: 1309 pg/mL — ABNORMAL HIGH (ref 211–911)

## 2016-10-17 DIAGNOSIS — R3915 Urgency of urination: Secondary | ICD-10-CM | POA: Diagnosis not present

## 2016-10-17 DIAGNOSIS — R350 Frequency of micturition: Secondary | ICD-10-CM | POA: Diagnosis not present

## 2016-10-19 ENCOUNTER — Other Ambulatory Visit: Payer: Self-pay | Admitting: Family Medicine

## 2016-10-19 DIAGNOSIS — K219 Gastro-esophageal reflux disease without esophagitis: Secondary | ICD-10-CM

## 2016-10-22 ENCOUNTER — Other Ambulatory Visit: Payer: Self-pay | Admitting: Family Medicine

## 2016-10-22 DIAGNOSIS — G2581 Restless legs syndrome: Secondary | ICD-10-CM

## 2016-10-22 NOTE — Telephone Encounter (Signed)
Last refill for tramadol #60 with 1 on 06/13/2016 Last office visit on 10/08/2016 No uds/no contract

## 2016-10-22 NOTE — Telephone Encounter (Signed)
Faxed hardcopy for Tramadol to [facility] in [place] 

## 2016-10-28 IMAGING — CT CT CTA ABD/PEL W/CM AND/OR W/O CM
1 of 3 series · 11 of 32 positions shown, 17 images · IV contrast (OMNIPAQUE 350)
Comparison: None

CLINICAL DATA: Bilateral flank pain and difficulty urinating.
Evaluate right renal artery aneurysm.

EXAM:
CT ANGIOGRAPHY ABDOMEN AND PELVIS
TECHNIQUE: Multidetector CT imaging of the abdomen and pelvis was performed
using the standard protocol during bolus administration of
intravenous contrast. Multiplanar reconstructed images including
MIPs were obtained and reviewed to evaluate the vascular anatomy.
CONTRAST:  100 mL Omnipaque 350

[Series 8: venous 5.0 b30f · axial · portal-venous · 0.66mm/px · z∈[-361,+4]mm · 11 of 89 slices shown, 17 images]
[im 8/89  soft-tissue]
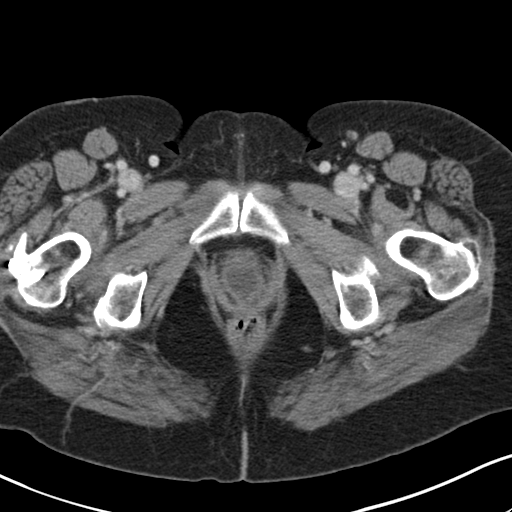
[im 8/89  bone]
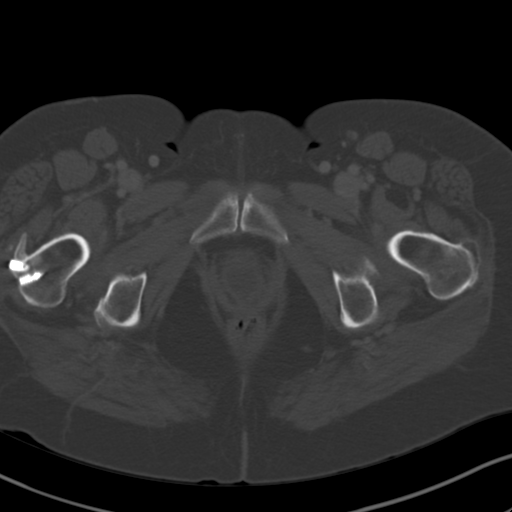
[im 15/89  soft-tissue]
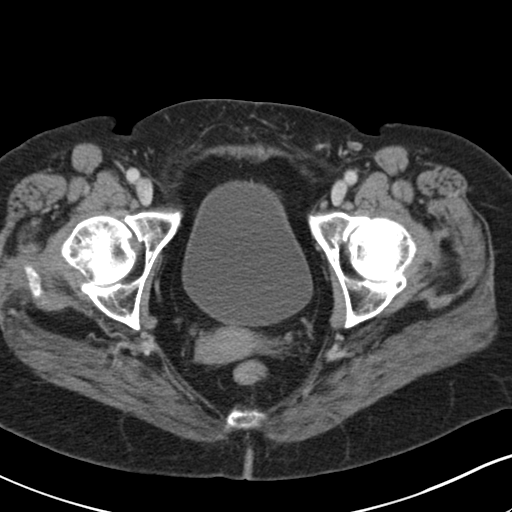
[im 23/89  soft-tissue]
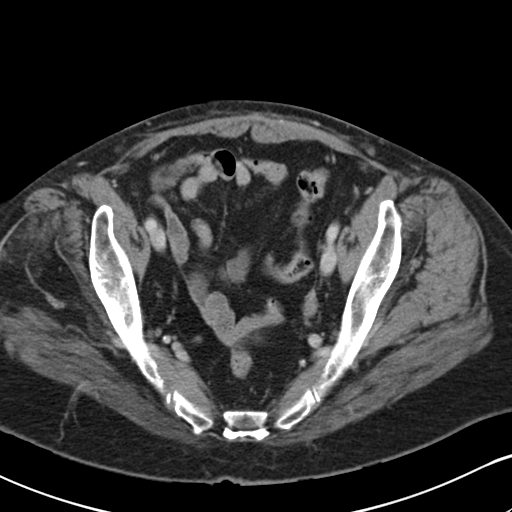
[im 30/89  soft-tissue]
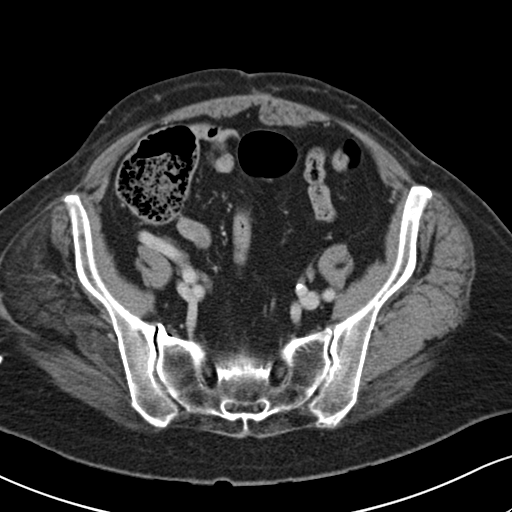
[im 37/89  soft-tissue]
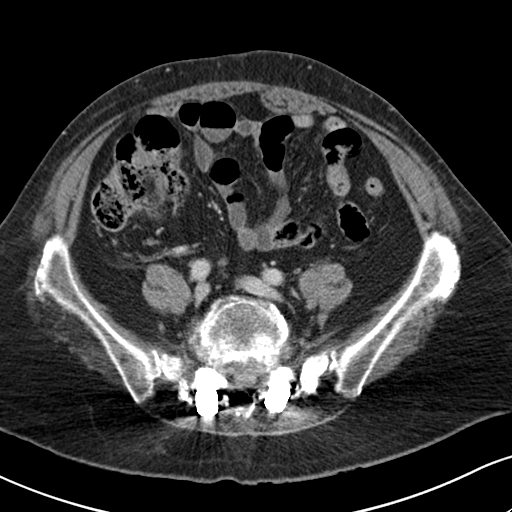
[im 45/89  soft-tissue]
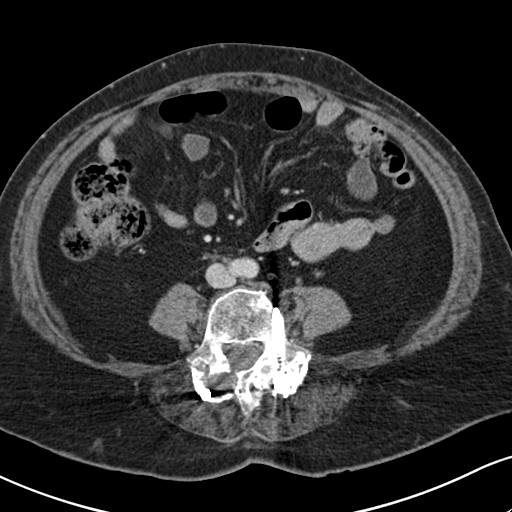
[im 52/89  soft-tissue]
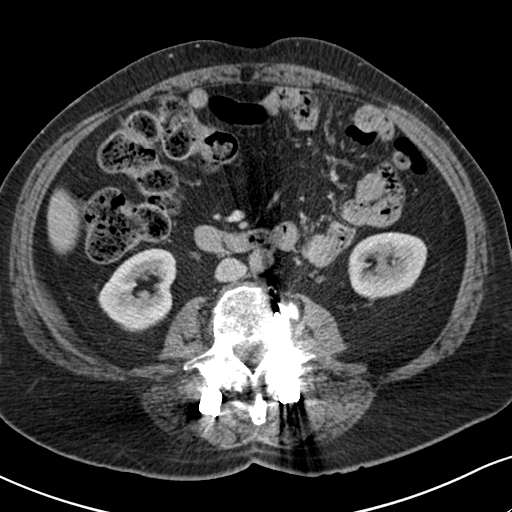
[im 59/89  soft-tissue]
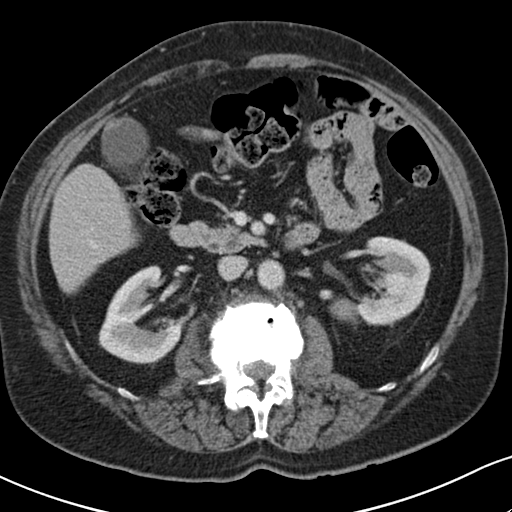
[im 59/89  lung]
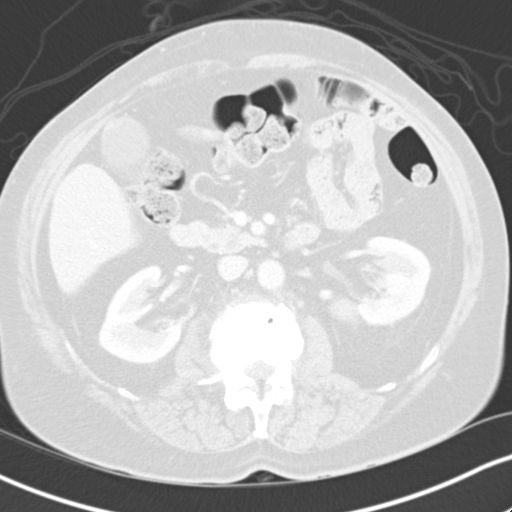
[im 67/89  soft-tissue]
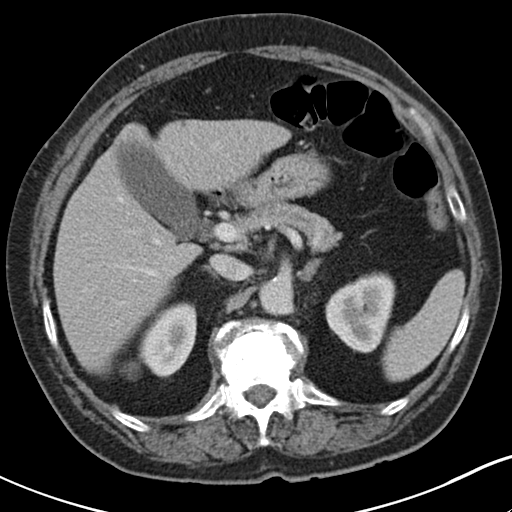
[im 67/89  lung]
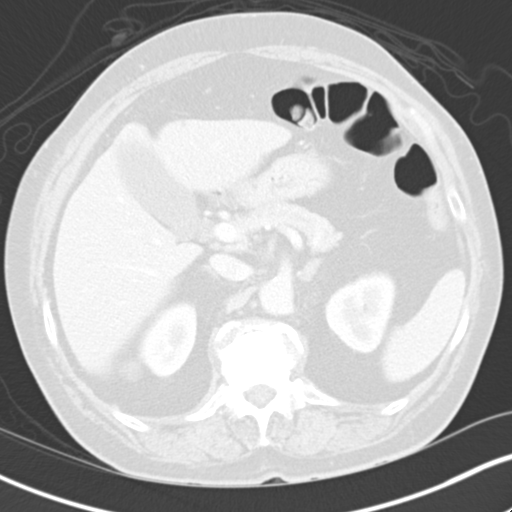
[im 67/89  bone]
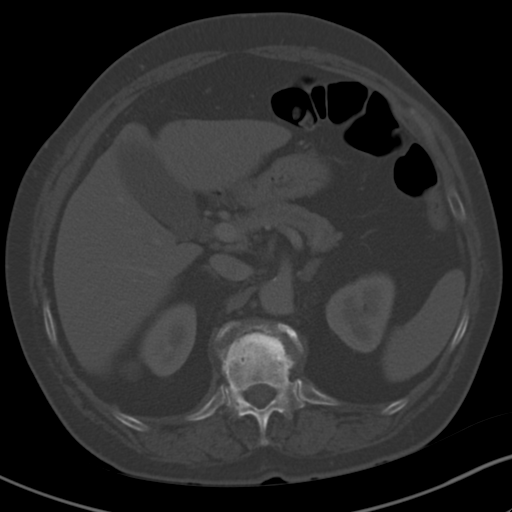
[im 74/89  soft-tissue]
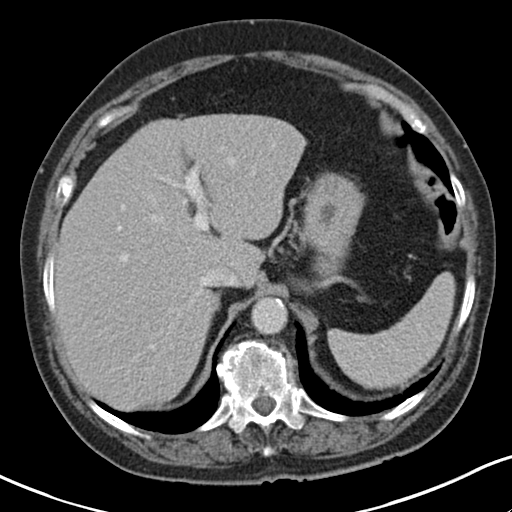
[im 74/89  lung]
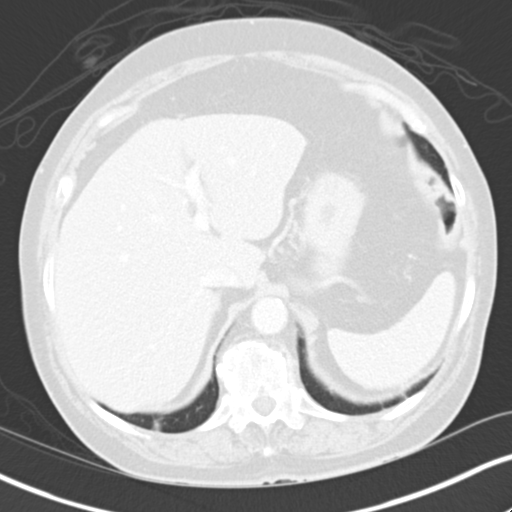
[im 81/89  soft-tissue]
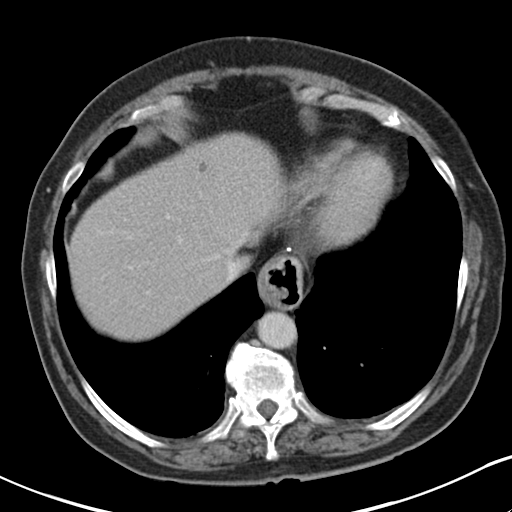
[im 81/89  lung]
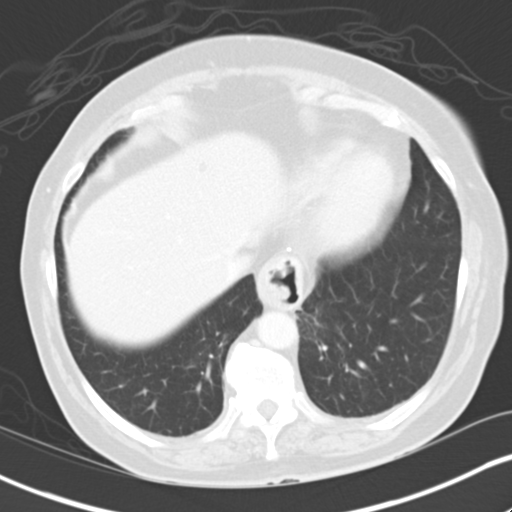

[11 of 32 positions shown; findings below may reference images not displayed]

FINDINGS: ARTERIAL FINDINGS:

Aorta: Normal caliber of the abdominal aorta without aneurysm or
dissection.

Celiac axis: Celiac trunk and main branches are widely patent.

Superior mesenteric: Superior mesenteric artery and main branches
are widely patent.

Left renal: Main left renal artery is widely patent. There is an
accessory inferior left renal artery. There is a small aneurysm
involving the left renal artery at the hilum and branching site.
This measures up to 1.3 cm. Unclear if this has significantly
changed.

Right renal: Single right renal artery. There is a bilobed aneurysm
involving the right renal artery at the hilum and the branching
site. This aneurysm has a small amount of peripheral thrombus.
Aneurysm measures 2.2 x 1.5 cm. This structure roughly measured up
to 2.2 cm on an outside MRI from 04/12/2013 and previous non
contrast CT from 03/07/2010. Prior examinations were not optimal for
evaluating this aneurysm but suspect there has been minimal change.

Inferior mesenteric: Proximal IMA is patent.

Left iliac: Left iliac arteries are patent without aneurysm or
significant plaque.

Right iliac: Right iliac arteries are patent without significant
plaque or aneurysm.

Venous findings: Iliac veins, IVC and renal veins are patent. Portal
venous system is patent.

Review of the MIP images confirms the above findings.

NONVASCULAR FINDINGS:

Lung bases are clear. Small moderate-sized hiatal hernia.
Calcifications or high-density material near the GE junction. Mild
dilatation of the pancreatic duct without an underlying pancreatic
lesion. Again noted are multiple low-density structures in the liver
which are suggestive for cysts. Gallbladder is mildly distended.
Normal appearance of the left kidney andadrenal glands. Evidence for
right renal cysts. Again noted is a heterogeneous structure in the
right kidney upper pole with some fat attenuation. This structure
measures up to 2.3 cm and measured up to 2.4 cm in 8744. Findings
are most compatible with a stable angiomyolipoma. No significant
free fluid or lymphadenopathy. Difficult to exclude is small uterine
fibroids. Normal appearance of the urinary bladder. No gross
abnormality to the adnexa tissue. No acute abnormality in the small
or large bowel. Surgical hardware in the proximal right femur. Plate
and screw fixation from L3-S1. Severe endplate changes at T12-L2.
IMPRESSION: Aneurysm of the right renal artery at the hilum and branching site.
This aneurysm roughly measures up to 2.2 cm and there has been
minimal change since 8744. Recommend non emergent vascular surgery
consultation for management and surveillance. In addition, there is
a small aneurysm involving the left renal artery, measuring up to
1.3 cm.

Stable fat attenuating lesion along the right kidney upper pole.
Findings are most compatible with a stable angiomyolipoma.

Hepatic and renal cysts.

## 2016-11-05 ENCOUNTER — Other Ambulatory Visit: Payer: Self-pay | Admitting: Family Medicine

## 2016-11-05 MED ORDER — "SYRINGE/NEEDLE (DISP) 25G X 1"" 3 ML MISC": 0 refills | Status: DC

## 2016-11-05 NOTE — Telephone Encounter (Signed)
Refill done.  

## 2016-11-05 NOTE — Telephone Encounter (Signed)
°  Relation to HY:WVPX Call back [place]: [facility] [place], [address] AT [address] [phone] (Phone) [phone] (Fax)     Reason for call:  Patient requesting a refill SYRINGE-NEEDLE, DISP, 3 ML (BD ECLIPSE SYRINGE) 25G X 1" 3 ML MISC

## 2016-11-07 DIAGNOSIS — M25551 Pain in right hip: Secondary | ICD-10-CM | POA: Diagnosis not present

## 2016-11-07 DIAGNOSIS — M76891 Other specified enthesopathies of right lower limb, excluding foot: Secondary | ICD-10-CM | POA: Diagnosis not present

## 2016-11-13 ENCOUNTER — Other Ambulatory Visit: Payer: Self-pay | Admitting: Family Medicine

## 2016-11-26 DIAGNOSIS — L570 Actinic keratosis: Secondary | ICD-10-CM | POA: Diagnosis not present

## 2016-11-26 DIAGNOSIS — L821 Other seborrheic keratosis: Secondary | ICD-10-CM | POA: Diagnosis not present

## 2016-11-26 DIAGNOSIS — Z85828 Personal history of other malignant neoplasm of skin: Secondary | ICD-10-CM | POA: Diagnosis not present

## 2016-11-26 DIAGNOSIS — L858 Other specified epidermal thickening: Secondary | ICD-10-CM | POA: Diagnosis not present

## 2016-11-26 DIAGNOSIS — D485 Neoplasm of uncertain behavior of skin: Secondary | ICD-10-CM | POA: Diagnosis not present

## 2016-11-27 ENCOUNTER — Other Ambulatory Visit: Payer: Self-pay | Admitting: Family Medicine

## 2016-11-27 DIAGNOSIS — G2581 Restless legs syndrome: Secondary | ICD-10-CM

## 2016-12-11 ENCOUNTER — Other Ambulatory Visit: Payer: Self-pay | Admitting: Family

## 2016-12-11 ENCOUNTER — Other Ambulatory Visit: Payer: Self-pay | Admitting: Family Medicine

## 2016-12-11 DIAGNOSIS — R32 Unspecified urinary incontinence: Secondary | ICD-10-CM

## 2016-12-12 NOTE — Telephone Encounter (Signed)
The medication that are requested for refill today. Pt says that they are not due for refill but she would like to know if they can be filled sooner because she will be going out of town?

## 2016-12-12 NOTE — Telephone Encounter (Signed)
We can post date but I don't know if that will help.   Pharmacy won't fill it early

## 2016-12-12 NOTE — Telephone Encounter (Signed)
Last office visit 10/08/2016 Refill #90 no refills on 09/17/2016

## 2016-12-13 DIAGNOSIS — Z87891 Personal history of nicotine dependence: Secondary | ICD-10-CM | POA: Diagnosis not present

## 2016-12-13 DIAGNOSIS — G8929 Other chronic pain: Secondary | ICD-10-CM | POA: Diagnosis not present

## 2016-12-13 DIAGNOSIS — M545 Low back pain: Secondary | ICD-10-CM | POA: Diagnosis not present

## 2016-12-13 DIAGNOSIS — Z981 Arthrodesis status: Secondary | ICD-10-CM | POA: Diagnosis not present

## 2016-12-13 DIAGNOSIS — Z9181 History of falling: Secondary | ICD-10-CM | POA: Diagnosis not present

## 2016-12-13 DIAGNOSIS — M438X6 Other specified deforming dorsopathies, lumbar region: Secondary | ICD-10-CM | POA: Diagnosis not present

## 2016-12-13 NOTE — Telephone Encounter (Signed)
Patient informed of PCP instructions regarding medication/she still wanted to get script sent in and put fill date for December 17, 2016/PCP signed and faxed to [facility] in [place] on [address]

## 2016-12-30 ENCOUNTER — Other Ambulatory Visit: Payer: Self-pay | Admitting: Family Medicine

## 2016-12-30 DIAGNOSIS — J069 Acute upper respiratory infection, unspecified: Secondary | ICD-10-CM

## 2016-12-30 DIAGNOSIS — G2581 Restless legs syndrome: Secondary | ICD-10-CM

## 2016-12-30 NOTE — Telephone Encounter (Signed)
Requesting:   tramadol Contract   none UDS   none Last OV    10/08/2016 Last Refill    #60 on 10/22/2016  Please Advise

## 2016-12-31 NOTE — Telephone Encounter (Signed)
Faxed hardcopy for Tramadol to [facility] in [place]

## 2017-01-01 DIAGNOSIS — Z981 Arthrodesis status: Secondary | ICD-10-CM | POA: Insufficient documentation

## 2017-01-06 DIAGNOSIS — R278 Other lack of coordination: Secondary | ICD-10-CM | POA: Diagnosis not present

## 2017-01-06 DIAGNOSIS — M76891 Other specified enthesopathies of right lower limb, excluding foot: Secondary | ICD-10-CM | POA: Diagnosis not present

## 2017-01-06 DIAGNOSIS — M545 Low back pain: Secondary | ICD-10-CM | POA: Diagnosis not present

## 2017-01-06 DIAGNOSIS — Z9181 History of falling: Secondary | ICD-10-CM | POA: Diagnosis not present

## 2017-01-06 DIAGNOSIS — R2689 Other abnormalities of gait and mobility: Secondary | ICD-10-CM | POA: Diagnosis not present

## 2017-01-08 DIAGNOSIS — M545 Low back pain: Secondary | ICD-10-CM | POA: Diagnosis not present

## 2017-01-08 DIAGNOSIS — R278 Other lack of coordination: Secondary | ICD-10-CM | POA: Diagnosis not present

## 2017-01-08 DIAGNOSIS — Z9181 History of falling: Secondary | ICD-10-CM | POA: Diagnosis not present

## 2017-01-08 DIAGNOSIS — R2689 Other abnormalities of gait and mobility: Secondary | ICD-10-CM | POA: Diagnosis not present

## 2017-01-08 DIAGNOSIS — M76891 Other specified enthesopathies of right lower limb, excluding foot: Secondary | ICD-10-CM | POA: Diagnosis not present

## 2017-01-10 DIAGNOSIS — R2689 Other abnormalities of gait and mobility: Secondary | ICD-10-CM | POA: Diagnosis not present

## 2017-01-10 DIAGNOSIS — R278 Other lack of coordination: Secondary | ICD-10-CM | POA: Diagnosis not present

## 2017-01-10 DIAGNOSIS — M545 Low back pain: Secondary | ICD-10-CM | POA: Diagnosis not present

## 2017-01-10 DIAGNOSIS — M76891 Other specified enthesopathies of right lower limb, excluding foot: Secondary | ICD-10-CM | POA: Diagnosis not present

## 2017-01-10 DIAGNOSIS — Z9181 History of falling: Secondary | ICD-10-CM | POA: Diagnosis not present

## 2017-01-14 DIAGNOSIS — R2689 Other abnormalities of gait and mobility: Secondary | ICD-10-CM | POA: Diagnosis not present

## 2017-01-14 DIAGNOSIS — M76891 Other specified enthesopathies of right lower limb, excluding foot: Secondary | ICD-10-CM | POA: Diagnosis not present

## 2017-01-14 DIAGNOSIS — Z9181 History of falling: Secondary | ICD-10-CM | POA: Diagnosis not present

## 2017-01-14 DIAGNOSIS — R278 Other lack of coordination: Secondary | ICD-10-CM | POA: Diagnosis not present

## 2017-01-14 DIAGNOSIS — M545 Low back pain: Secondary | ICD-10-CM | POA: Diagnosis not present

## 2017-01-16 ENCOUNTER — Other Ambulatory Visit: Payer: Self-pay | Admitting: Family Medicine

## 2017-01-16 DIAGNOSIS — M76891 Other specified enthesopathies of right lower limb, excluding foot: Secondary | ICD-10-CM | POA: Diagnosis not present

## 2017-01-16 DIAGNOSIS — Z9181 History of falling: Secondary | ICD-10-CM | POA: Diagnosis not present

## 2017-01-16 DIAGNOSIS — R2689 Other abnormalities of gait and mobility: Secondary | ICD-10-CM | POA: Diagnosis not present

## 2017-01-16 DIAGNOSIS — M545 Low back pain: Secondary | ICD-10-CM | POA: Diagnosis not present

## 2017-01-16 DIAGNOSIS — R32 Unspecified urinary incontinence: Secondary | ICD-10-CM

## 2017-01-16 DIAGNOSIS — K219 Gastro-esophageal reflux disease without esophagitis: Secondary | ICD-10-CM

## 2017-01-16 DIAGNOSIS — R278 Other lack of coordination: Secondary | ICD-10-CM | POA: Diagnosis not present

## 2017-01-17 DIAGNOSIS — M545 Low back pain: Secondary | ICD-10-CM | POA: Diagnosis not present

## 2017-01-17 DIAGNOSIS — Z9181 History of falling: Secondary | ICD-10-CM | POA: Diagnosis not present

## 2017-01-17 DIAGNOSIS — M76891 Other specified enthesopathies of right lower limb, excluding foot: Secondary | ICD-10-CM | POA: Diagnosis not present

## 2017-01-17 DIAGNOSIS — R2689 Other abnormalities of gait and mobility: Secondary | ICD-10-CM | POA: Diagnosis not present

## 2017-01-17 DIAGNOSIS — R278 Other lack of coordination: Secondary | ICD-10-CM | POA: Diagnosis not present

## 2017-01-31 ENCOUNTER — Other Ambulatory Visit: Payer: Self-pay | Admitting: Family Medicine

## 2017-01-31 DIAGNOSIS — J069 Acute upper respiratory infection, unspecified: Secondary | ICD-10-CM

## 2017-01-31 DIAGNOSIS — G2581 Restless legs syndrome: Secondary | ICD-10-CM

## 2017-01-31 NOTE — Telephone Encounter (Signed)
Requesting: Tramadol Contract: None UDS: None Last OV: 5.8.18 Next OV:  Will need appt in Oct Last Refill: 7.31.18   Please advise

## 2017-01-31 NOTE — Telephone Encounter (Signed)
Pt says that she is going out of town and need her medications filled as soon as possible.

## 2017-01-31 NOTE — Telephone Encounter (Signed)
Faxed signed Rx for Tramadol to pharm/thx dmf

## 2017-02-12 DIAGNOSIS — R278 Other lack of coordination: Secondary | ICD-10-CM | POA: Diagnosis not present

## 2017-02-12 DIAGNOSIS — G8929 Other chronic pain: Secondary | ICD-10-CM | POA: Diagnosis not present

## 2017-02-12 DIAGNOSIS — R2689 Other abnormalities of gait and mobility: Secondary | ICD-10-CM | POA: Diagnosis not present

## 2017-02-12 DIAGNOSIS — M545 Low back pain: Secondary | ICD-10-CM | POA: Diagnosis not present

## 2017-02-12 DIAGNOSIS — Z9181 History of falling: Secondary | ICD-10-CM | POA: Diagnosis not present

## 2017-02-16 ENCOUNTER — Other Ambulatory Visit: Payer: Self-pay | Admitting: Family Medicine

## 2017-02-16 DIAGNOSIS — R32 Unspecified urinary incontinence: Secondary | ICD-10-CM

## 2017-02-17 DIAGNOSIS — G8929 Other chronic pain: Secondary | ICD-10-CM | POA: Diagnosis not present

## 2017-02-17 DIAGNOSIS — Z9181 History of falling: Secondary | ICD-10-CM | POA: Diagnosis not present

## 2017-02-17 DIAGNOSIS — M545 Low back pain: Secondary | ICD-10-CM | POA: Diagnosis not present

## 2017-02-17 DIAGNOSIS — R2689 Other abnormalities of gait and mobility: Secondary | ICD-10-CM | POA: Diagnosis not present

## 2017-02-17 DIAGNOSIS — R278 Other lack of coordination: Secondary | ICD-10-CM | POA: Diagnosis not present

## 2017-02-17 NOTE — Telephone Encounter (Signed)
Please advise on refill request Last ov 10/08/16 No pending OV Both last filled 01/16/17

## 2017-02-19 DIAGNOSIS — Z9181 History of falling: Secondary | ICD-10-CM | POA: Diagnosis not present

## 2017-02-19 DIAGNOSIS — M545 Low back pain: Secondary | ICD-10-CM | POA: Diagnosis not present

## 2017-02-19 DIAGNOSIS — G8929 Other chronic pain: Secondary | ICD-10-CM | POA: Diagnosis not present

## 2017-02-19 DIAGNOSIS — R278 Other lack of coordination: Secondary | ICD-10-CM | POA: Diagnosis not present

## 2017-02-19 DIAGNOSIS — R2689 Other abnormalities of gait and mobility: Secondary | ICD-10-CM | POA: Diagnosis not present

## 2017-02-19 NOTE — Telephone Encounter (Signed)
meds approved will close message

## 2017-03-05 ENCOUNTER — Other Ambulatory Visit: Payer: Self-pay | Admitting: Family Medicine

## 2017-03-07 NOTE — Telephone Encounter (Signed)
This refill came to dr Sherren Mocha in error, please advice.

## 2017-03-18 ENCOUNTER — Other Ambulatory Visit: Payer: Self-pay | Admitting: Family Medicine

## 2017-03-18 DIAGNOSIS — R32 Unspecified urinary incontinence: Secondary | ICD-10-CM

## 2017-03-18 DIAGNOSIS — Z23 Encounter for immunization: Secondary | ICD-10-CM | POA: Diagnosis not present

## 2017-03-19 NOTE — Telephone Encounter (Signed)
Cherelle Midkiff Self [phone]  XARELTO 20 MG TABS tablet   Aishia called to say she is completely out of this medicine. Please call and advice.

## 2017-03-26 ENCOUNTER — Telehealth: Payer: Self-pay

## 2017-03-26 NOTE — Telephone Encounter (Signed)
Copied from [place] [id number]. Topic: Inquiry >> Mar 24, 2017 10:43 AM Arloa Koh D wrote: Reason for CRM: XARELTO concern of side effects question. Pt states she heard of lawsuits of this medication causing internal bleeding. Pt wants to know if provider wants to change to a new blood thinner medication.

## 2017-03-27 NOTE — Telephone Encounter (Signed)
Called left message to call back 

## 2017-03-27 NOTE — Telephone Encounter (Signed)
There are side effects but similar side effects with all the anticoagulants---- looking at the notes when she had the PE she was supposed to only be on it for 6 months --- did someone else tell her she should stay on it longer.  We may be able to stop it and just take aspirin daily.

## 2017-03-28 NOTE — Telephone Encounter (Signed)
Called left message to call back 

## 2017-03-31 NOTE — Telephone Encounter (Signed)
Called left message to call back 

## 2017-04-02 ENCOUNTER — Other Ambulatory Visit: Payer: Self-pay | Admitting: Family Medicine

## 2017-04-02 DIAGNOSIS — G2581 Restless legs syndrome: Secondary | ICD-10-CM

## 2017-04-03 NOTE — Telephone Encounter (Signed)
I have attempted several days to reach this patient but she does not answer/or return my phone calls.

## 2017-04-03 NOTE — Telephone Encounter (Signed)
Please send her a letter that she needs to call the office

## 2017-04-04 ENCOUNTER — Other Ambulatory Visit: Payer: Self-pay

## 2017-04-04 DIAGNOSIS — Z7901 Long term (current) use of anticoagulants: Secondary | ICD-10-CM

## 2017-04-04 NOTE — Telephone Encounter (Signed)
Called and spoke with Pt, she states she looked up the side effects and symptoms of xarelto and pending lawsuits and no longer wishes to take the medication. Pt stated she was told by surgeon who performed surgery for past PE informed her that she would be on blood thinners "for the rest of her life" and states she would feel better if she had a better option. After consulting with PCP a referral to hematology will be placed to she can go over all options and find a regimen that works best for Pt.

## 2017-04-07 NOTE — Telephone Encounter (Signed)
Rx sent in as requested via FAX

## 2017-04-07 NOTE — Telephone Encounter (Signed)
Patient's initial request was on 10/31. Patient would like a refill on Tramadol send to [facility]. Please advise patient if this refill cannot be done.

## 2017-04-09 ENCOUNTER — Other Ambulatory Visit: Payer: Self-pay | Admitting: Family Medicine

## 2017-04-10 ENCOUNTER — Other Ambulatory Visit: Payer: Self-pay | Admitting: Family Medicine

## 2017-04-10 NOTE — Telephone Encounter (Signed)
Rx was routed to dr Sherren Mocha by error, pt needs refill  please Advise.

## 2017-04-14 ENCOUNTER — Other Ambulatory Visit: Payer: Self-pay | Admitting: Family Medicine

## 2017-04-14 DIAGNOSIS — K219 Gastro-esophageal reflux disease without esophagitis: Secondary | ICD-10-CM

## 2017-04-14 DIAGNOSIS — J069 Acute upper respiratory infection, unspecified: Secondary | ICD-10-CM

## 2017-04-20 ENCOUNTER — Other Ambulatory Visit: Payer: Self-pay | Admitting: Family Medicine

## 2017-04-20 DIAGNOSIS — R32 Unspecified urinary incontinence: Secondary | ICD-10-CM

## 2017-05-08 ENCOUNTER — Other Ambulatory Visit (HOSPITAL_BASED_OUTPATIENT_CLINIC_OR_DEPARTMENT_OTHER): Payer: Medicare Other

## 2017-05-08 ENCOUNTER — Ambulatory Visit (HOSPITAL_BASED_OUTPATIENT_CLINIC_OR_DEPARTMENT_OTHER): Payer: Medicare Other | Admitting: Hematology & Oncology

## 2017-05-08 ENCOUNTER — Encounter: Payer: Self-pay | Admitting: Hematology & Oncology

## 2017-05-08 ENCOUNTER — Other Ambulatory Visit: Payer: Self-pay

## 2017-05-08 VITALS — BP 132/64 | HR 80 | Temp 97.7°F | Resp 16 | Wt 201.0 lb

## 2017-05-08 DIAGNOSIS — M79605 Pain in left leg: Secondary | ICD-10-CM | POA: Diagnosis not present

## 2017-05-08 DIAGNOSIS — Z7901 Long term (current) use of anticoagulants: Secondary | ICD-10-CM

## 2017-05-08 DIAGNOSIS — I2601 Septic pulmonary embolism with acute cor pulmonale: Secondary | ICD-10-CM

## 2017-05-08 DIAGNOSIS — Z86711 Personal history of pulmonary embolism: Secondary | ICD-10-CM

## 2017-05-08 DIAGNOSIS — D501 Sideropenic dysphagia: Secondary | ICD-10-CM

## 2017-05-08 DIAGNOSIS — F411 Generalized anxiety disorder: Secondary | ICD-10-CM

## 2017-05-08 LAB — CMP (CANCER CENTER ONLY)
ALK PHOS: 61 U/L (ref 26–84)
ALT: 25 U/L (ref 10–47)
AST: 22 U/L (ref 11–38)
Albumin: 3.8 g/dL (ref 3.3–5.5)
BILIRUBIN TOTAL: 0.6 mg/dL (ref 0.20–1.60)
BUN: 15 mg/dL (ref 7–22)
CO2: 27 mEq/L (ref 18–33)
CREATININE: 1.2 mg/dL (ref 0.6–1.2)
Calcium: 9.6 mg/dL (ref 8.0–10.3)
Chloride: 104 mEq/L (ref 98–108)
GLUCOSE: 99 mg/dL (ref 73–118)
Potassium: 4.7 mEq/L (ref 3.3–4.7)
Sodium: 144 mEq/L (ref 128–145)
TOTAL PROTEIN: 7.1 g/dL (ref 6.4–8.1)

## 2017-05-08 LAB — CBC WITH DIFFERENTIAL (CANCER CENTER ONLY)
BASO#: 0.1 10*3/uL (ref 0.0–0.2)
BASO%: 0.8 % (ref 0.0–2.0)
EOS%: 3.6 % (ref 0.0–7.0)
Eosinophils Absolute: 0.2 10*3/uL (ref 0.0–0.5)
HCT: 37.2 % (ref 34.8–46.6)
HGB: 12.4 g/dL (ref 11.6–15.9)
LYMPH#: 1.8 10*3/uL (ref 0.9–3.3)
LYMPH%: 28.4 % (ref 14.0–48.0)
MCH: 29.4 pg (ref 26.0–34.0)
MCHC: 33.3 g/dL (ref 32.0–36.0)
MCV: 88 fL (ref 81–101)
MONO#: 0.6 10*3/uL (ref 0.1–0.9)
MONO%: 9.4 % (ref 0.0–13.0)
NEUT#: 3.7 10*3/uL (ref 1.5–6.5)
NEUT%: 57.8 % (ref 39.6–80.0)
PLATELETS: 312 10*3/uL (ref 145–400)
RBC: 4.22 10*6/uL (ref 3.70–5.32)
RDW: 15.9 % — AB (ref 11.1–15.7)
WBC: 6.4 10*3/uL (ref 3.9–10.0)

## 2017-05-08 MED ORDER — APIXABAN 5 MG PO TABS: 5.0000 mg | ORAL_TABLET | Freq: Two times a day (BID) | ORAL | 12 refills | Status: DC

## 2017-05-08 NOTE — Progress Notes (Signed)
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 Follow-up  : I have been on Xarelto and I would like to get off and try something different.  HPI: Felicia Cruz is a very nice 76 year old white female.  She has had a history of past surgery.  She had her first pulmonary embolism back in 2015.  This is after hip surgery.  She is on Xarelto for 6 months.  In June of this year, she had back surgery.  This was at [facility].  This was an incredibly extensive back surgery.  Apparently, 3 days later, she had shortness of breath.  She was found to have a massive pulmonary embolism.  She subsequently underwent bilateral ultrasound-guided thrombin lysis with IVC filter.  This was done on 12/07/2016.  She apparently had no problems with this.  She was then started on Xarelto.  She has been reading a lot about the bleeding issues with Xarelto.  She wants to get off Xarelto and try something different.  She has not had any follow-up studies after her pulmonary emboli.  She is having some pain in the left leg.  She uses a walker.  She apparently had a back surgery and a nerve was cut that caused her weakness in her left leg.  She gets around very carefully.  She has no family history of thromboembolic disease.  She has had 4 children.  She had no problems with blood clots during pregnancy.  She used to smoke but this was about 30 years ago.  She does not have diabetes.  She has not been on estrogens in the postmenopausal state.  She just feels tired.  She just feels worn out.  We were asked to see her to try to help with managing her anticoagulation and getting her off Xarelto  Currently, her performance status is ECOG 2.   Past Medical History:  Diagnosis Date  . Alcoholism ([place])    HAS NOT HAD [place]  . Arthritis   . B12 deficiency   . Cataract   . Chronic back pain   . DDD  (degenerative disc disease), lumbar   . Esophageal stricture 2010  . GERD (gastroesophageal reflux disease)   . Hiatal hernia   . Hyperlipemia   . Kidney stones   . Osteopenia   . 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 RLS (restless legs syndrome)   . 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 ABDOMINOPLASTY    . DILATION AND CURETTAGE OF UTERUS  many yrs ago   x 2  . ESOPHAGEAL MANOMETRY  02/17/2012   Procedure: ESOPHAGEAL MANOMETRY (EM);  Surgeon: Pedro Earls, MD;  Location: [facility];  Service: Endoscopy;  Laterality: N/A;  . FACIAL COSMETIC SURGERY     eyes, nose  . JOINT REPLACEMENT     left  . LAPAROSCOPIC NISSEN FUNDOPLICATION  10/11/2583   Procedure: LAPAROSCOPIC NISSEN FUNDOPLICATION;  Surgeon: Pedro Earls, MD;  Location: [facility];  Service: General;  Laterality: N/A;  Laparoscopic Nissen repair of hiatal hernia  with lighted bougie  . LIPOSUCTION  15 to 20 yrs ago  . [place] SURGERY  2008  . OPEN SURGICAL REPAIR OF GLUTEAL TENDON Right 12/28/2012   Procedure: RIGHT GLUTEAL TENDON REPAIR;  Surgeon: Mauri Pole, MD;  Location: [facility];  Service: Orthopedics;  Laterality: Right;  . RIGHT INDEX FINGER SURGERY  06/19/11   AT [facility]  . SURGERY FOR KIDNEY STONES    . 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   Procedure: TOTAL KNEE ARTHROPLASTY;  Surgeon: Mauri Pole, MD;  Location: [facility];  Service: Orthopedics;  Laterality: Left;  :   Current Outpatient Medications:  .  albuterol (VENTOLIN HFA) 108 (90 BASE) MCG/ACT inhaler, Inhale 2 puffs into the lungs every 4 (four) hours as needed for wheezing or shortness of breath (or cough)., Disp: 1 Inhaler, Rfl: 0 .  calcium-vitamin D (CALCIUM 500/D) 500-200 MG-UNIT tablet, Take by mouth., Disp: , Rfl:  .  citalopram (CELEXA) 20 MG tablet, 1-1/2 tablets at bedtime, Disp: 150 tablet, Rfl: 3 .  cyanocobalamin (,VITAMIN  B-12,) 1000 MCG/ML injection, INJECT 1 ML INTO THE MUSCLE ONCE A MONTH, Disp: , Rfl: 0 .  fluticasone (FLONASE) 50 MCG/ACT nasal spray, Place 2 sprays into both nostrils daily., Disp: 16 g, Rfl: 6 .  levocetirizine (XYZAL) 5 MG tablet, TAKE 1 TABLET(5 MG) BY MOUTH EVERY EVENING, Disp: 30 tablet, Rfl: 0 .  LORazepam (ATIVAN) 1 MG tablet, TAKE 1 TABLET BY MOUTH EVERY NIGHT AT BEDTIME, Disp: 90 tablet, Rfl: 0 .  losartan-hydrochlorothiazide (HYZAAR) 100-12.5 MG tablet, TAKE 1 TABLET BY MOUTH EVERY MORNING, Disp: 90 tablet, Rfl: 3 .  MYRBETRIQ 50 MG TB24 tablet, TAKE 1 TABLET BY MOUTH EVERY DAY, Disp: 30 tablet, Rfl: 0 .  pantoprazole (PROTONIX) 40 MG tablet, TAKE 1 TABLET(40 MG) BY MOUTH DAILY, Disp: 90 tablet, Rfl: 0 .  rOPINIRole (REQUIP) 1 MG tablet, Take 1 tablet (1 mg total) by mouth at bedtime., Disp: 30 tablet, Rfl: 5 .  Solifenacin Succinate (VESICARE PO), Take 1 tablet by mouth daily., Disp: , Rfl:  .  SYRINGE-NEEDLE, DISP, 3 ML (BD ECLIPSE SYRINGE) 25G X 1" 3 ML MISC, Inject 48ml monthly of B12, Disp: 30 each, Rfl: 0 .  traMADol (ULTRAM) 50 MG tablet, TAKE 1/2-1 TABLET BY MOUTH TWICE DAILY AS NEEDED FOR SEVERE PAIN, Disp: 60 tablet, Rfl: 0 .  XARELTO 20 MG TABS tablet, TAKE 1 TABLET BY MOUTH EVERY DAY WITH DINNER, Disp: 30 tablet, Rfl: 1:  :  Allergies  Allergen Reactions  . Atorvastatin Other (See Comments)    Memory loss, confusion, hallucinations Amnesia  . Gabapentin Other (See Comments)    Chest pain  . Lyrica [Pregabalin] Other (See Comments)    Water blisters on face  . Penicillins Hives, Shortness Of Breath, Itching and Anaphylaxis  . Acyclovir And Related   . Famciclovir Other (See Comments)  . Latex Other (See Comments) and Rash    Skin peeling (thumb)  . Methocarbamol Other (See Comments)    Blisters on face  :  Family History  Problem Relation Age of Onset  . Throat cancer Father        Smoker  . Heart disease Mother   . Hypertension Mother   . Hip fracture  Mother   . Alzheimer's disease Mother   . Stroke Mother   . Stroke Brother 10       smoker  :  Social History   Socioeconomic History  . Marital status: Widowed    Spouse name: Not on file  . Number of children: 0  . Years of education: Not on file  . Highest education level: Not on file  Social Needs  . Financial resource strain: Not on file  . Food insecurity -  worry: Not on file  . Food insecurity - inability: Not on file  . Transportation needs - medical: Not on file  . Transportation needs - non-medical: Not on file  Occupational History  . Occupation: Retired  Tobacco Use  . Smoking status: Former Smoker    Packs/day: 1.00    Years: 10.00    Pack years: 10.00    Types: Cigarettes    Last attempt to quit: 06/04/1971    Years since quitting: 45.9  . Smokeless tobacco: Never Used  Substance and Sexual Activity  . Alcohol use: No    Alcohol/week: 0.0 oz    Comment: RECOVERING ALCOLHOLIC  . Drug use: No  . Sexual activity: Not on file  Other Topics Concern  . Not on file  Social History Narrative   Regular exercise-YES  :  Pertinent items are noted in HPI.  Exam: Fairly well-developed and well-nourished white female in no obvious distress.  Vital signs show a temperature of 97.7.  Pulse 80.  Blood pressure 132/64.  Weight is 201 pounds.  Head neck exam shows no ocular or oral lesions.  There are no palpable cervical or supra clavicular lymph nodes.  Lungs are clear bilaterally.  No friction rubs noted.  She has no wheezes.  She has good air movement bilaterally.  Cardiac exam regular rate and rhythm with no murmurs, rubs or bruits.  Abdomen is soft.  She has good bowel sounds.  There is no fluid wave.  There is no palpable liver or spleen tip.  Back exam shows a laminectomy scar in the mid to lower thoracic/lumbar spine.  This is well-healed.  Extremities shows stockings on her lower legs.  She may have some slight nonpitting edema of the left lower leg.  No obvious  venous cord is noted in the legs.  She has negative Homans sign bilaterally.  Skin exam shows no rashes, ecchymoses or petechia.  Neurological exam shows no focal neurological deficits. @IPVITALS @   Recent Labs    05/08/17 1415  WBC 6.4  HGB 12.4  HCT 37.2  PLT 312   Recent Labs    05/08/17 1415  NA 144  K 4.7  CL 104  CO2 27  GLUCOSE 99  BUN 15  CREATININE 1.2  CALCIUM 9.6    Blood smear review: None  Pathology: None    Assessment and Plan: Felicia Cruz is a 76 year old white female.  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  Both were associated with surgery.  I am somewhat surprised that she is still on anticoagulation.  She has an IVC filter in place.  However, she was told that she needed lifelong anticoagulation.  She is worried about on Xarelto.  I think that Xarelto would be safe.  However, she would just not have "peace of mind" because of Xarelto.  As such, we will see about getting her onto Eliquis.  I really would like to get a CT angiogram of her chest and Doppler of her left leg to make sure that there is no residual thromboembolic disease.  I would not think that she has a hypercoagulable state.  However, we probably do need to check for this.  I cannot find any outside risk factors that she has that would lead to her having a pulmonary embolism.  I spent about 45 minutes with her.  She is very nice.  She is very well educated.  We talked quite a bit.  I will call in  the Eliquis for her.  We will see about the CT angiogram of the chest and Doppler next week.  I would like to see her back in about 6 weeks.  I want to make sure that she is doing okay on the Eliquis.

## 2017-05-14 ENCOUNTER — Telehealth: Payer: Self-pay | Admitting: Family Medicine

## 2017-05-14 DIAGNOSIS — R32 Unspecified urinary incontinence: Secondary | ICD-10-CM

## 2017-05-14 DIAGNOSIS — J069 Acute upper respiratory infection, unspecified: Secondary | ICD-10-CM

## 2017-05-14 DIAGNOSIS — G2581 Restless legs syndrome: Secondary | ICD-10-CM

## 2017-05-14 DIAGNOSIS — Z79899 Other long term (current) drug therapy: Secondary | ICD-10-CM

## 2017-05-21 ENCOUNTER — Ambulatory Visit (HOSPITAL_BASED_OUTPATIENT_CLINIC_OR_DEPARTMENT_OTHER): Payer: Medicare Other

## 2017-05-21 ENCOUNTER — Ambulatory Visit (HOSPITAL_BASED_OUTPATIENT_CLINIC_OR_DEPARTMENT_OTHER): Admission: RE | Admit: 2017-05-21 | Payer: Medicare Other | Source: Ambulatory Visit

## 2017-05-21 ENCOUNTER — Other Ambulatory Visit: Payer: Self-pay | Admitting: Family Medicine

## 2017-05-21 DIAGNOSIS — G2581 Restless legs syndrome: Secondary | ICD-10-CM

## 2017-05-21 DIAGNOSIS — J069 Acute upper respiratory infection, unspecified: Secondary | ICD-10-CM

## 2017-05-21 NOTE — Telephone Encounter (Signed)
Dr Carollee Herter:  Last tramadol RX: 04/07/17, #60 Last OV: 10/08/16 Next OV:  Was due 04/2017 and is past due now UDS:  Not on file CSC: Not on file CSR:  No discrepancies identified  Please advise?   Felicia Cruz-- Pt is past due for follow up with Dr Carollee Herter.  Please call pt to schedule appt soon. Thanks!

## 2017-05-21 NOTE — Telephone Encounter (Signed)
Called pt and left message to sechedule a visit to receive any further refills.

## 2017-05-22 NOTE — Telephone Encounter (Signed)
Left detailed message on machine must have UDS and sign contract.  Rx ready for pickup

## 2017-05-22 NOTE — Addendum Note (Signed)
Addended by: Kem Boroughs D on: 05/22/2017 04:28 PM   Modules accepted: Orders

## 2017-05-22 NOTE — Telephone Encounter (Signed)
Need contract and uds Refill x1

## 2017-06-02 NOTE — Telephone Encounter (Signed)
Noted  

## 2017-06-02 NOTE — Telephone Encounter (Signed)
Pt says that she will be in on Friday to sign contract. Pt says that if an appt is needed for medication to get a 90 day supply, pt would like to just get a 30 day. Pt says that she has been out of her medication.

## 2017-06-06 ENCOUNTER — Encounter (HOSPITAL_BASED_OUTPATIENT_CLINIC_OR_DEPARTMENT_OTHER): Payer: Self-pay

## 2017-06-06 ENCOUNTER — Ambulatory Visit (HOSPITAL_BASED_OUTPATIENT_CLINIC_OR_DEPARTMENT_OTHER)
Admission: RE | Admit: 2017-06-06 | Discharge: 2017-06-06 | Disposition: A | Discharge status: Home / Self Care | Payer: Medicare Other | Source: Ambulatory Visit | Attending: Hematology & Oncology | Admitting: Hematology & Oncology

## 2017-06-06 ENCOUNTER — Other Ambulatory Visit: Payer: Medicare Other

## 2017-06-06 DIAGNOSIS — R600 Localized edema: Secondary | ICD-10-CM | POA: Diagnosis not present

## 2017-06-06 DIAGNOSIS — Z79899 Other long term (current) drug therapy: Secondary | ICD-10-CM | POA: Diagnosis not present

## 2017-06-06 DIAGNOSIS — I2601 Septic pulmonary embolism with acute cor pulmonale: Secondary | ICD-10-CM

## 2017-06-06 DIAGNOSIS — Z86711 Personal history of pulmonary embolism: Secondary | ICD-10-CM | POA: Diagnosis not present

## 2017-06-06 DIAGNOSIS — I2699 Other pulmonary embolism without acute cor pulmonale: Secondary | ICD-10-CM | POA: Diagnosis not present

## 2017-06-06 HISTORY — DX: Essential (primary) hypertension: I10

## 2017-06-06 HISTORY — DX: Unspecified asthma, uncomplicated: J45.909

## 2017-06-06 MED ORDER — IOPAMIDOL (ISOVUE-370) INJECTION 76%
100.0000 mL | Freq: Once | INTRAVENOUS | Status: AC | PRN
  Administered 2017-06-06: 100 mL via INTRAVENOUS

## 2017-06-09 ENCOUNTER — Telehealth: Payer: Self-pay | Admitting: *Deleted

## 2017-06-09 NOTE — Telephone Encounter (Addendum)
Patient is aware of results  ----- Message from Eliezer Bottom, NP sent at 06/09/2017 11:33 AM EST ----- Regarding: CT angio No evidence of PE :) Thanks! Sorry I didn't see the [place] and CT at the same time! ----- Message ----- From: Volanda Napoleon, MD Sent: 06/06/2017   4:54 PM To: Eliezer Bottom, NP    ----- Message ----- From: Interface, Rad Results In Sent: 06/06/2017   1:29 PM To: Volanda Napoleon, MD  [place], NP  P Onc Nurse Hp        No evidence of DVT :) Thank you!

## 2017-06-10 LAB — PAIN MGMT, PROFILE 8 W/CONF, U
6 ACETYLMORPHINE: NEGATIVE ng/mL (ref <10)
ALPHAHYDROXYMIDAZOLAM: NEGATIVE ng/mL (ref <50)
Alcohol Metabolites: NEGATIVE ng/mL (ref <500)
Alphahydroxyalprazolam: NEGATIVE ng/mL (ref <25)
Alphahydroxytriazolam: NEGATIVE ng/mL (ref <50)
Aminoclonazepam: NEGATIVE ng/mL (ref <25)
Amphetamines: NEGATIVE ng/mL (ref <500)
BENZODIAZEPINES: POSITIVE ng/mL — AB (ref <100)
Buprenorphine, Urine: NEGATIVE ng/mL (ref <5)
COCAINE METABOLITE: NEGATIVE ng/mL (ref <150)
CREATININE: 77.4 mg/dL
HYDROXYETHYLFLURAZEPAM: NEGATIVE ng/mL (ref <50)
Lorazepam: 564 ng/mL — ABNORMAL HIGH (ref <50)
MARIJUANA METABOLITE: NEGATIVE ng/mL (ref <20)
MDMA: NEGATIVE ng/mL (ref <500)
NORDIAZEPAM: NEGATIVE ng/mL (ref <50)
OPIATES: NEGATIVE ng/mL (ref <100)
OXIDANT: NEGATIVE ug/mL (ref <200)
Oxazepam: NEGATIVE ng/mL (ref <50)
Oxycodone: NEGATIVE ng/mL (ref <100)
PH: 6.21 (ref 4.5–9.0)
Temazepam: NEGATIVE ng/mL (ref <50)

## 2017-06-13 ENCOUNTER — Inpatient Hospital Stay: Payer: Medicare Other | Attending: Hematology & Oncology | Admitting: Hematology & Oncology

## 2017-06-13 ENCOUNTER — Inpatient Hospital Stay: Payer: Medicare Other

## 2017-06-13 ENCOUNTER — Encounter: Payer: Self-pay | Admitting: Hematology & Oncology

## 2017-06-13 ENCOUNTER — Other Ambulatory Visit: Payer: Self-pay | Admitting: *Deleted

## 2017-06-13 VITALS — BP 151/80 | HR 80 | Temp 98.1°F | Resp 18 | Wt 203.0 lb

## 2017-06-13 DIAGNOSIS — I739 Peripheral vascular disease, unspecified: Secondary | ICD-10-CM | POA: Diagnosis not present

## 2017-06-13 DIAGNOSIS — I2601 Septic pulmonary embolism with acute cor pulmonale: Secondary | ICD-10-CM

## 2017-06-13 DIAGNOSIS — I2602 Saddle embolus of pulmonary artery with acute cor pulmonale: Secondary | ICD-10-CM

## 2017-06-13 DIAGNOSIS — F411 Generalized anxiety disorder: Secondary | ICD-10-CM

## 2017-06-13 DIAGNOSIS — I2782 Chronic pulmonary embolism: Secondary | ICD-10-CM

## 2017-06-13 DIAGNOSIS — I2699 Other pulmonary embolism without acute cor pulmonale: Secondary | ICD-10-CM | POA: Diagnosis not present

## 2017-06-13 DIAGNOSIS — D501 Sideropenic dysphagia: Secondary | ICD-10-CM

## 2017-06-13 DIAGNOSIS — Z7901 Long term (current) use of anticoagulants: Secondary | ICD-10-CM | POA: Diagnosis not present

## 2017-06-13 DIAGNOSIS — Z86711 Personal history of pulmonary embolism: Secondary | ICD-10-CM | POA: Diagnosis not present

## 2017-06-13 LAB — CBC WITH DIFFERENTIAL (CANCER CENTER ONLY)
BASOS PCT: 1 %
Basophils Absolute: 0.1 10*3/uL (ref 0.0–0.1)
Eosinophils Absolute: 0.2 10*3/uL (ref 0.0–0.5)
Eosinophils Relative: 3 %
HEMATOCRIT: 38 % (ref 34.8–46.6)
HEMOGLOBIN: 12.7 g/dL (ref 11.6–15.9)
Lymphocytes Relative: 29 %
Lymphs Abs: 1.7 10*3/uL (ref 0.9–3.3)
MCH: 29.7 pg (ref 26.0–34.0)
MCHC: 33.4 g/dL (ref 32.0–36.0)
MCV: 89 fL (ref 81.0–101.0)
Monocytes Absolute: 0.5 10*3/uL (ref 0.1–0.9)
Monocytes Relative: 8 %
NEUTROS ABS: 3.6 10*3/uL (ref 1.5–6.5)
NEUTROS PCT: 59 %
Platelet Count: 348 10*3/uL (ref 145–400)
RBC: 4.27 MIL/uL (ref 3.70–5.32)
RDW: 15 % (ref 11.1–15.7)
WBC: 6 10*3/uL (ref 3.9–10.3)

## 2017-06-13 LAB — CMP (CANCER CENTER ONLY)
ALK PHOS: 61 U/L (ref 40–150)
ALT: 19 U/L (ref 0–55)
ANION GAP: 13 — AB (ref 3–11)
AST: 21 U/L (ref 5–34)
Albumin: 4.3 g/dL (ref 3.5–5.0)
BILIRUBIN TOTAL: 0.4 mg/dL (ref 0.2–1.2)
BUN: 15 mg/dL (ref 7–26)
CALCIUM: 9.7 mg/dL (ref 8.4–10.4)
CO2: 21 mmol/L — AB (ref 22–29)
Chloride: 104 mmol/L (ref 98–109)
Creatinine: 0.93 mg/dL (ref 0.60–1.10)
GFR, Estimated: 58 mL/min — ABNORMAL LOW (ref >60)
Glucose, Bld: 90 mg/dL (ref 70–140)
Potassium: 3.9 mmol/L (ref 3.3–4.7)
SODIUM: 138 mmol/L (ref 136–145)
TOTAL PROTEIN: 7.6 g/dL (ref 6.4–8.3)

## 2017-06-13 LAB — ANTITHROMBIN III: AntiThromb III Func: 97 % (ref 75–120)

## 2017-06-13 NOTE — Progress Notes (Signed)
Hematology and Oncology Follow Up Visit  Felicia Cruz [id number] 1940/12/05 77 y.o. 06/13/2017   Principle Diagnosis:   Recurrent pulmonary embolism-idiopathic Eliquis  Current Therapy:    Eliquis 5 mg p.o. twice daily     Interim History:  Felicia Cruz is back for follow-up.  We saw her in early December.  At that time, she had a problem with recurrent thromboembolic disease.  She had pulmonary emboli.  Her workup has been pretty much unremarkable.  She was switched over to Eliquis.  She enjoys Eliquis.  It is not causing any problems for her.  She has had no bleeding.  We did go ahead and repeat her CT angiogram.  This did not show any residual pulmonary emboli.  We also did a Doppler of her left leg.  This did not show any thrombus.  She has had no cough or shortness of breath.  She has had no rashes.  She has had no change in bowel or bladder habits.  There is been no headache.  She got through the Christmas and New Year's holiday without difficulty.  She is still has a neurological issues which occurred after her back surgery.  She gets around with a rolling walker.  Currently, her performance status is ECOG 1.  Medications:  Current Outpatient Medications:  .  albuterol (VENTOLIN HFA) 108 (90 BASE) MCG/ACT inhaler, Inhale 2 puffs into the lungs every 4 (four) hours as needed for wheezing or shortness of breath (or cough)., Disp: 1 Inhaler, Rfl: 0 .  apixaban (ELIQUIS) 5 MG TABS tablet, Take 1 tablet (5 mg total) by mouth 2 (two) times daily., Disp: 60 tablet, Rfl: 12 .  calcium-vitamin D (CALCIUM 500/D) 500-200 MG-UNIT tablet, Take by mouth., Disp: , Rfl:  .  citalopram (CELEXA) 20 MG tablet, 1-1/2 tablets at bedtime, Disp: 150 tablet, Rfl: 3 .  cyanocobalamin (,VITAMIN B-12,) 1000 MCG/ML injection, INJECT 1 ML INTO THE MUSCLE ONCE A MONTH, Disp: , Rfl: 0 .  fluticasone (FLONASE) 50 MCG/ACT nasal spray, Place 2 sprays into both nostrils daily., Disp: 16 g, Rfl: 6 .   levocetirizine (XYZAL) 5 MG tablet, TAKE 1 TABLET(5 MG) BY MOUTH EVERY EVENING, Disp: 30 tablet, Rfl: 0 .  LORazepam (ATIVAN) 1 MG tablet, TAKE 1 TABLET BY MOUTH EVERY NIGHT AT BEDTIME, Disp: 90 tablet, Rfl: 0 .  losartan-hydrochlorothiazide (HYZAAR) 100-12.5 MG tablet, TAKE 1 TABLET BY MOUTH EVERY MORNING, Disp: 90 tablet, Rfl: 3 .  MYRBETRIQ 50 MG TB24 tablet, TAKE 1 TABLET BY MOUTH EVERY DAY, Disp: 30 tablet, Rfl: 0 .  pantoprazole (PROTONIX) 40 MG tablet, TAKE 1 TABLET(40 MG) BY MOUTH DAILY, Disp: 90 tablet, Rfl: 0 .  rOPINIRole (REQUIP) 1 MG tablet, TAKE 1 TABLET(1 MG) BY MOUTH AT BEDTIME, Disp: 30 tablet, Rfl: 0 .  Solifenacin Succinate (VESICARE PO), Take 1 tablet by mouth daily., Disp: , Rfl:  .  SYRINGE-NEEDLE, DISP, 3 ML (BD ECLIPSE SYRINGE) 25G X 1" 3 ML MISC, Inject 50ml monthly of B12, Disp: 30 each, Rfl: 0 .  traMADol (ULTRAM) 50 MG tablet, TAKE 1/2 TO 1 TABLET BY MOUTH TWICE DAILY AS NEEDED FOR SEVERE PAIN, Disp: 60 tablet, Rfl: 0 .  XARELTO 20 MG TABS tablet, TK 1 T PO QD WITH DINNER, Disp: , Rfl: 1  Allergies:  Allergies  Allergen Reactions  . Atorvastatin Other (See Comments)    Memory loss, confusion, hallucinations Amnesia  . Gabapentin Other (See Comments)    Chest pain  . Lyrica [Pregabalin] Other (  See Comments)    Water blisters on face  . Penicillins Hives, Shortness Of Breath, Itching and Anaphylaxis  . Acyclovir And Related   . Famciclovir Other (See Comments)  . Latex Other (See Comments) and Rash    Skin peeling (thumb)  . Methocarbamol Other (See Comments)    Blisters on face    Past Medical History, Surgical history, Social history, and Family History were reviewed and updated.  Review of Systems: Review of Systems  Constitutional: Negative.   HENT:  Negative.   Eyes: Negative.   Respiratory: Negative.   Cardiovascular: Negative.   Gastrointestinal: Negative.   Endocrine: Negative.   Genitourinary: Negative.    Musculoskeletal: Positive for back  pain and gait problem.  Skin: Negative.   Neurological: Positive for gait problem.  Hematological: Negative.   Psychiatric/Behavioral: Negative.     Physical Exam:  weight is 203 lb (92.1 kg). Her oral temperature is 98.1 F (36.7 C). Her blood pressure is 151/80 (abnormal) and her pulse is 80. Her respiration is 18 and oxygen saturation is 97%.   Wt Readings from Last 3 Encounters:  06/13/17 203 lb (92.1 kg)  05/08/17 201 lb (91.2 kg)  10/08/16 192 lb 9.6 oz (87.4 kg)    Physical Exam  Constitutional: She is oriented to person, place, and time.  HENT:  Head: Normocephalic and atraumatic.  Mouth/Throat: Oropharynx is clear and moist.  Eyes: EOM are normal. Pupils are equal, round, and reactive to light.  Neck: Normal range of motion.  Cardiovascular: Normal rate, regular rhythm and normal heart sounds.  Pulmonary/Chest: Effort normal and breath sounds normal.  Abdominal: Soft. Bowel sounds are normal.  Musculoskeletal: Normal range of motion. She exhibits no edema, tenderness or deformity.  Lymphadenopathy:    She has no cervical adenopathy.  Neurological: She is alert and oriented to person, place, and time.  Skin: Skin is warm and dry. No rash noted. No erythema.  Psychiatric: She has a normal mood and affect. Her behavior is normal. Judgment and thought content normal.  Vitals reviewed.    Lab Results  Component Value Date   WBC 6.4 05/08/2017   HGB 12.4 05/08/2017   HCT 38.0 06/13/2017   MCV 89.0 06/13/2017   PLT 312 05/08/2017     Chemistry      Component Value Date/Time   NA 144 05/08/2017 1415   K 4.7 05/08/2017 1415   CL 104 05/08/2017 1415   CO2 27 05/08/2017 1415   BUN 15 05/08/2017 1415   CREATININE 1.2 05/08/2017 1415      Component Value Date/Time   CALCIUM 9.6 05/08/2017 1415   ALKPHOS 61 05/08/2017 1415   AST 22 05/08/2017 1415   ALT 25 05/08/2017 1415   BILITOT 0.60 05/08/2017 1415         Impression and Plan: Felicia Cruz is a 77 year  old white female with recurrent thromboembolic disease.  Again everything appears to be idiopathic from what I can tell.  She wants to stay on the Eliquis.  I do not have any problems with this.  From my point of view, I do still think were really adding much to her medical care right now.  As such, I told her that we can see her back as needed.  I do not see that we have to make any type of routine follow-up with her.  She becomes a long way to see [place].  It is nice to talk with her.    Rudell Cobb  Marin Olp, MD 1/11/20192:29 PM

## 2017-06-14 LAB — PROTEIN C ACTIVITY: Protein C Activity: 145 % (ref 73–180)

## 2017-06-14 LAB — PROTEIN S ACTIVITY: Protein S Activity: 118 % (ref 63–140)

## 2017-06-14 LAB — PROTEIN S, TOTAL: Protein S Ag, Total: 131 % (ref 60–150)

## 2017-06-15 LAB — BETA-2-GLYCOPROTEIN I ABS, IGG/M/A: Beta-2-Glycoprotein I IgM: <9 GPI IgM units (ref 0–32)

## 2017-06-15 LAB — PROTEIN C, TOTAL: Protein C, Total: 120 % (ref 60–150)

## 2017-06-16 ENCOUNTER — Telehealth: Payer: Self-pay | Admitting: *Deleted

## 2017-06-16 LAB — IRON AND TIBC
Iron: 66 ug/dL (ref 41–142)
Saturation Ratios: 16 % — ABNORMAL LOW (ref 21–57)
TIBC: 411 ug/dL (ref 236–444)
UIBC: 346 ug/dL

## 2017-06-16 LAB — FERRITIN: FERRITIN: 15 ng/mL (ref 9–269)

## 2017-06-16 LAB — LUPUS ANTICOAGULANT PANEL
DRVVT: 58.5 s — ABNORMAL HIGH (ref 0.0–47.0)
PTT Lupus Anticoagulant: 28.5 s (ref 0.0–51.9)

## 2017-06-16 LAB — TSH: TSH: 1.7 u[IU]/mL (ref 0.308–3.960)

## 2017-06-16 LAB — DRVVT CONFIRM: dRVVT Confirm: 0.9 ratio (ref 0.8–1.2)

## 2017-06-16 LAB — CARDIOLIPIN ANTIBODIES, IGG, IGM, IGA: Anticardiolipin IgA: <9 APL U/mL (ref 0–11)

## 2017-06-16 LAB — DRVVT MIX: dRVVT Mix: 50.1 s — ABNORMAL HIGH (ref 0.0–47.0)

## 2017-06-16 NOTE — Telephone Encounter (Signed)
-----   Message from Felicia Napoleon, MD sent at 06/13/2017  4:42 PM EST ----- Call - labs look great!!!  [name]

## 2017-06-18 LAB — FACTOR 5 LEIDEN

## 2017-06-19 LAB — PROTHROMBIN GENE MUTATION

## 2017-06-20 ENCOUNTER — Telehealth: Payer: Self-pay | Admitting: *Deleted

## 2017-06-20 NOTE — Telephone Encounter (Addendum)
Patient is aware of results. Appointment made.   ----- Message from Volanda Napoleon, MD sent at 06/19/2017  5:11 PM EST ----- Call - iron is low!!  A dose of IV Injectafer can make her feel better!!  Please set this up!!!  Lattie Haw, Rudell Cobb, MD  P Onc Nurse Hp        Call - all of the blood clotting studies are normal!!!! Felicia Cruz

## 2017-06-23 ENCOUNTER — Other Ambulatory Visit: Payer: Self-pay | Admitting: Family Medicine

## 2017-06-23 ENCOUNTER — Inpatient Hospital Stay: Payer: Medicare Other

## 2017-06-23 VITALS — BP 130/60 | HR 6 | Resp 16

## 2017-06-23 DIAGNOSIS — I2699 Other pulmonary embolism without acute cor pulmonale: Secondary | ICD-10-CM | POA: Diagnosis not present

## 2017-06-23 DIAGNOSIS — Z7901 Long term (current) use of anticoagulants: Secondary | ICD-10-CM | POA: Diagnosis not present

## 2017-06-23 DIAGNOSIS — D500 Iron deficiency anemia secondary to blood loss (chronic): Secondary | ICD-10-CM

## 2017-06-23 HISTORY — DX: Iron deficiency anemia secondary to blood loss (chronic): D50.0

## 2017-06-23 MED ORDER — SODIUM CHLORIDE 0.9 % IV SOLN
750.0000 mg | Freq: Once | INTRAVENOUS | Status: AC
  Administered 2017-06-23: 750 mg via INTRAVENOUS
  Filled 2017-06-23: qty 15

## 2017-06-23 NOTE — Patient Instructions (Signed)

## 2017-06-26 ENCOUNTER — Telehealth: Payer: Self-pay | Admitting: Family Medicine

## 2017-06-26 NOTE — Telephone Encounter (Signed)
Copied from [place]. Topic: Quick Communication - See Telephone Encounter >> Jun 26, 2017  3:10 PM Conception Chancy, NT wrote: CRM for notification. See Telephone encounter for:  06/26/17.  Pt is upset that her pharmacy requested a refill for her Lorazepam on 06/23/17 and it has not be refilled yet. Pt uses [facility] in [place]. Please advise.

## 2017-06-26 NOTE — Telephone Encounter (Signed)
Need more info

## 2017-06-27 ENCOUNTER — Other Ambulatory Visit: Payer: Self-pay | Admitting: Family Medicine

## 2017-06-27 MED ORDER — LORAZEPAM 1 MG PO TABS: 1.0000 mg | ORAL_TABLET | Freq: Every day | ORAL | 0 refills | Status: DC

## 2017-06-27 NOTE — Telephone Encounter (Signed)
Refill x1 

## 2017-06-27 NOTE — Telephone Encounter (Signed)
Left message on machine that rx was sent.

## 2017-06-27 NOTE — Telephone Encounter (Signed)
Patient requesting refill for lorazepam  Database ran and is in media  Last filled per database: 03/19/17 Last written: 03/19/17 Last ov: 10/08/16 Next ov: none Contract:06/06/17 UDS: next 06/06/18

## 2017-06-29 ENCOUNTER — Other Ambulatory Visit: Payer: Self-pay | Admitting: Family Medicine

## 2017-06-29 DIAGNOSIS — R32 Unspecified urinary incontinence: Secondary | ICD-10-CM

## 2017-06-29 DIAGNOSIS — G2581 Restless legs syndrome: Secondary | ICD-10-CM

## 2017-06-29 DIAGNOSIS — J069 Acute upper respiratory infection, unspecified: Secondary | ICD-10-CM

## 2017-07-01 ENCOUNTER — Telehealth: Payer: Self-pay | Admitting: *Deleted

## 2017-07-01 NOTE — Telephone Encounter (Signed)
Patient c/o constipation x 1 week. She has been taking Miralax daily without improvement. She'd like to know if there is anything else she can try.   Spoke with Dr Marin Olp. He would like patient to increase Miralax to twice daily. Patient understands instructions.

## 2017-07-09 DIAGNOSIS — Z1231 Encounter for screening mammogram for malignant neoplasm of breast: Secondary | ICD-10-CM | POA: Diagnosis not present

## 2017-07-09 LAB — HM MAMMOGRAPHY

## 2017-07-12 ENCOUNTER — Other Ambulatory Visit: Payer: Self-pay | Admitting: Family Medicine

## 2017-07-12 DIAGNOSIS — K219 Gastro-esophageal reflux disease without esophagitis: Secondary | ICD-10-CM

## 2017-07-18 ENCOUNTER — Encounter: Payer: Self-pay | Admitting: *Deleted

## 2017-07-25 ENCOUNTER — Other Ambulatory Visit: Payer: Self-pay | Admitting: Family Medicine

## 2017-07-25 DIAGNOSIS — G2581 Restless legs syndrome: Secondary | ICD-10-CM

## 2017-07-25 DIAGNOSIS — R32 Unspecified urinary incontinence: Secondary | ICD-10-CM

## 2017-07-25 DIAGNOSIS — J069 Acute upper respiratory infection, unspecified: Secondary | ICD-10-CM

## 2017-07-29 ENCOUNTER — Telehealth: Payer: Self-pay | Admitting: Family Medicine

## 2017-07-29 ENCOUNTER — Telehealth: Payer: Self-pay | Admitting: *Deleted

## 2017-07-29 NOTE — Telephone Encounter (Signed)
Pt requesting medications to be called in for more than 30 days at a time.   LOV: 10/08/16  PCP: Dr. Lawson Radar  Pharmacy: [facility] in [place]

## 2017-07-29 NOTE — Telephone Encounter (Signed)
Copied from [place] [phone]. Topic: Quick Communication - Rx Refill/Question >> Jul 29, 2017 10:07 AM Lysle Morales L, NT wrote: Medication: 1. losartan-hydrochlorothiazide  100-12.5 MG tablet,2.tramadol ultram 50 MG tablet 3levocetirizine XYZAL5 MG tablet ,4 ropinirole 1 MG tablet, 5  MYRBETRIQ 50 MG TB24 tablet   Has the patient contacted their pharmacy? yes  Agent: If no, request that the patient contact the pharmacy for the refill. Preferred Pharmacy with phone number or street name :[facility], [address] AT [facility] [address] [place] [phone] Phone [phone] (Fax Agent: Please be advised that RX refills may take up to 3 business days. We ask that you follow-up with your pharmacy. Patient would like these called in for more than 30 days at a time,

## 2017-07-29 NOTE — Telephone Encounter (Signed)
Copied from [place] [id number]. Topic: General - Other >> Jul 29, 2017 10:08 AM Felicia Cruz, NT wrote: Reason for CRM: Patient would like call from Dr Carollee Herter to understand why s it difficult to get her medication refill every month , please call her [phone]

## 2017-07-29 NOTE — Telephone Encounter (Signed)
Copied from [place] [phone]. Topic: Quick Communication - Rx Refill/Question >> Jul 29, 2017 10:07 AM Lysle Morales L, NT wrote: Medication: 1. losartan-hydrochlorothiazide HYZAAR 100-12.5 MG tablet,2. tramadol ultram 50 MG tablet ,3 levocetirizine XYZAL 5 MG tablet ,4rOPINIRole REQUIP 1 MG tablet, 5MYRBETRIQ 50 MG TB24 tablet   Has the patient contacted their pharmacy? yes  (Agent: If no, request that the patient contact the pharmacy for the refill. Preferred Pharmacy (with phone number or street name:[id number] [place], [address] AT [facility] [address] [place] [phone] Phone [phone] Fax Agent: Please be advised that RX refills may take up to 3 business days. We ask that you follow-up with your pharmacy. Patient would like these called in for more than 30 days at a time,if possible

## 2017-07-29 NOTE — Telephone Encounter (Signed)
Request for tramadol  Database ran and is in media 06/27/17   Last written: 05/22/17 Last ov: 10/08/16 Next ov: none  Contract: 06/06/18 UDS: 12/04/17

## 2017-07-30 NOTE — Telephone Encounter (Signed)
Patient notified that rx was sent in  

## 2017-07-30 NOTE — Telephone Encounter (Signed)
Patient called late yesterday explained to her how we refill rxs.  All rxs filled except Tramadol.  Will call patient to let her when it is refilled.

## 2017-08-13 DIAGNOSIS — Z85828 Personal history of other malignant neoplasm of skin: Secondary | ICD-10-CM | POA: Diagnosis not present

## 2017-08-13 DIAGNOSIS — L570 Actinic keratosis: Secondary | ICD-10-CM | POA: Diagnosis not present

## 2017-08-13 DIAGNOSIS — L821 Other seborrheic keratosis: Secondary | ICD-10-CM | POA: Diagnosis not present

## 2017-08-13 DIAGNOSIS — D0371 Melanoma in situ of right lower limb, including hip: Secondary | ICD-10-CM | POA: Diagnosis not present

## 2017-08-13 DIAGNOSIS — L820 Inflamed seborrheic keratosis: Secondary | ICD-10-CM | POA: Diagnosis not present

## 2017-08-13 DIAGNOSIS — D485 Neoplasm of uncertain behavior of skin: Secondary | ICD-10-CM | POA: Diagnosis not present

## 2017-08-13 DIAGNOSIS — D225 Melanocytic nevi of trunk: Secondary | ICD-10-CM | POA: Diagnosis not present

## 2017-08-24 ENCOUNTER — Other Ambulatory Visit: Payer: Self-pay | Admitting: Family Medicine

## 2017-08-24 DIAGNOSIS — R32 Unspecified urinary incontinence: Secondary | ICD-10-CM

## 2017-09-04 DIAGNOSIS — L988 Other specified disorders of the skin and subcutaneous tissue: Secondary | ICD-10-CM | POA: Diagnosis not present

## 2017-09-04 DIAGNOSIS — D0371 Melanoma in situ of right lower limb, including hip: Secondary | ICD-10-CM | POA: Diagnosis not present

## 2017-09-04 DIAGNOSIS — Z85828 Personal history of other malignant neoplasm of skin: Secondary | ICD-10-CM | POA: Diagnosis not present

## 2017-09-08 ENCOUNTER — Other Ambulatory Visit: Payer: Self-pay | Admitting: Family Medicine

## 2017-09-08 DIAGNOSIS — G2581 Restless legs syndrome: Secondary | ICD-10-CM

## 2017-09-09 NOTE — Telephone Encounter (Signed)
[facility] [address] requesting refill for tramadol  Database ran 07/29/17 and is in media  Last written: 07/29/17 Last ov: 10/08/16 Next ov: none Contract: 06/06/18 UDS: 12/14/17

## 2017-09-22 ENCOUNTER — Ambulatory Visit (INDEPENDENT_AMBULATORY_CARE_PROVIDER_SITE_OTHER): Payer: Medicare Other | Admitting: Family Medicine

## 2017-09-22 ENCOUNTER — Encounter: Payer: Self-pay | Admitting: Family Medicine

## 2017-09-22 VITALS — BP 110/70 | HR 91 | Temp 98.1°F | Resp 16 | Ht 66.0 in | Wt 198.0 lb

## 2017-09-22 DIAGNOSIS — G4700 Insomnia, unspecified: Secondary | ICD-10-CM | POA: Diagnosis not present

## 2017-09-22 DIAGNOSIS — E785 Hyperlipidemia, unspecified: Secondary | ICD-10-CM

## 2017-09-22 DIAGNOSIS — I10 Essential (primary) hypertension: Secondary | ICD-10-CM

## 2017-09-22 DIAGNOSIS — K219 Gastro-esophageal reflux disease without esophagitis: Secondary | ICD-10-CM

## 2017-09-22 LAB — COMPREHENSIVE METABOLIC PANEL
ALBUMIN: 4.4 g/dL (ref 3.5–5.2)
ALK PHOS: 45 U/L (ref 39–117)
ALT: 19 U/L (ref 0–35)
AST: 18 U/L (ref 0–37)
BUN: 16 mg/dL (ref 6–23)
CHLORIDE: 103 meq/L (ref 96–112)
CO2: 25 mEq/L (ref 19–32)
Calcium: 9.9 mg/dL (ref 8.4–10.5)
Creatinine, Ser: 0.85 mg/dL (ref 0.40–1.20)
GFR: 69 mL/min (ref >60.00)
Glucose, Bld: 98 mg/dL (ref 70–99)
POTASSIUM: 3.8 meq/L (ref 3.5–5.1)
Sodium: 139 mEq/L (ref 135–145)
TOTAL PROTEIN: 6.9 g/dL (ref 6.0–8.3)
Total Bilirubin: 0.4 mg/dL (ref 0.2–1.2)

## 2017-09-22 LAB — CBC WITH DIFFERENTIAL/PLATELET
BASOS PCT: 1.7 % (ref 0.0–3.0)
Basophils Absolute: 0.1 10*3/uL (ref 0.0–0.1)
EOS PCT: 4.9 % (ref 0.0–5.0)
Eosinophils Absolute: 0.2 10*3/uL (ref 0.0–0.7)
HCT: 39.4 % (ref 36.0–46.0)
HEMOGLOBIN: 13.6 g/dL (ref 12.0–15.0)
LYMPHS ABS: 1.7 10*3/uL (ref 0.7–4.0)
Lymphocytes Relative: 33.2 % (ref 12.0–46.0)
MCHC: 34.5 g/dL (ref 30.0–36.0)
MCV: 91.6 fl (ref 78.0–100.0)
MONOS PCT: 7.3 % (ref 3.0–12.0)
Monocytes Absolute: 0.4 10*3/uL (ref 0.1–1.0)
Neutro Abs: 2.7 10*3/uL (ref 1.4–7.7)
Neutrophils Relative %: 52.9 % (ref 43.0–77.0)
Platelets: 347 10*3/uL (ref 150.0–400.0)
RBC: 4.3 Mil/uL (ref 3.87–5.11)
RDW: 15.1 % (ref 11.5–15.5)
WBC: 5.1 10*3/uL (ref 4.0–10.5)

## 2017-09-22 LAB — LIPID PANEL
CHOLESTEROL: 247 mg/dL — AB (ref 0–200)
HDL: 62.9 mg/dL (ref >39.00)
LDL Cholesterol: 151 mg/dL — ABNORMAL HIGH (ref 0–99)
NonHDL: 184.26
TRIGLYCERIDES: 165 mg/dL — AB (ref 0.0–149.0)
Total CHOL/HDL Ratio: 4
VLDL: 33 mg/dL (ref 0.0–40.0)

## 2017-09-22 MED ORDER — LORAZEPAM 1 MG PO TABS: 1.0000 mg | ORAL_TABLET | Freq: Every day | ORAL | 0 refills | Status: DC

## 2017-09-22 NOTE — Assessment & Plan Note (Signed)
Encouraged heart healthy diet, increase exercise, avoid trans fats, consider a krill oil cap daily 

## 2017-09-22 NOTE — Progress Notes (Signed)
Patient ID: Felicia Cruz, female    DOB: 06/25/1940  Age: 77 y.o. MRN: [id number]    Subjective:  Subjective  HPI Felicia Cruz presents for f/u htn , cholesterol and med refills.  No complaints.     Review of Systems  Constitutional: Negative for chills and fever.  HENT: Negative for congestion and hearing loss.   Eyes: Negative for discharge.  Respiratory: Negative for cough and shortness of breath.   Cardiovascular: Negative for chest pain, palpitations and leg swelling.  Gastrointestinal: Negative for abdominal pain, blood in stool, constipation, diarrhea, nausea and vomiting.  Genitourinary: Negative for dysuria, frequency, hematuria and urgency.  Musculoskeletal: Negative for back pain and myalgias.  Skin: Negative for rash.  Allergic/Immunologic: Negative for environmental allergies.  Neurological: Negative for dizziness, weakness and headaches.  Hematological: Does not bruise/bleed easily.  Psychiatric/Behavioral: Negative for suicidal ideas. The patient is not nervous/anxious.     History Past Medical History:  Diagnosis Date  . Alcoholism ([place])    HAS NOT HAD [place]  . Arthritis   . Asthma   . B12 deficiency   . Cataract   . Chronic back pain   . DDD (degenerative disc disease), lumbar   . Esophageal stricture 2010  . GERD (gastroesophageal reflux disease)   . Hiatal hernia   . Hyperlipemia   . Hypertension   . Iron deficiency anemia due to chronic blood loss 06/23/2017  . Kidney stones   . Osteopenia   . 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 RLS (restless legs syndrome)   . 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   Her family history includes Alzheimer's disease in her mother; Heart disease in her mother; Hip fracture in her mother; Hypertension in her mother; Stroke in her mother; Stroke (age of onset: 63) in her brother; Throat cancer in her father.She reports that she quit smoking about 46 years ago. Her smoking use included cigarettes. She has a 10.00 pack-year smoking history. She has never used smokeless tobacco. She reports that she does not drink alcohol or use drugs.  Current Outpatient Medications on File Prior to Visit  Medication Sig Dispense Refill  . albuterol (VENTOLIN HFA) 108 (90 BASE) MCG/ACT inhaler Inhale 2 puffs into the lungs every 4 (four) hours as needed for wheezing or shortness of breath (or cough). 1 Inhaler 0  . apixaban (ELIQUIS) 5 MG TABS tablet Take 1 tablet (5 mg total) by mouth 2 (two) times daily. 60 tablet 12  . calcium-vitamin D (CALCIUM 500/D) 500-200 MG-UNIT tablet Take by mouth.    Marland Kitchen CANNABIDIOL PO Take 300 mg by mouth 2 (two) times daily. cbd oil    . citalopram (CELEXA) 20 MG tablet 1-1/2 tablets at bedtime 150 tablet 3  . cyanocobalamin (,VITAMIN B-12,) 1000 MCG/ML injection INJECT 1 ML INTO THE MUSCLE ONCE A MONTH  0  . fluticasone (FLONASE) 50 MCG/ACT nasal spray Place 2 sprays into both nostrils daily. 16 g 6  . levocetirizine (XYZAL) 5 MG tablet TAKE 1 TABLET BY MOUTH EVERY EVENING 90 tablet 0  .  losartan-hydrochlorothiazide (HYZAAR) 100-12.5 MG tablet TAKE 1 TABLET BY MOUTH EVERY MORNING 90 tablet 0  . MYRBETRIQ 50 MG TB24 tablet TAKE 1 TABLET BY MOUTH EVERY DAY 90 tablet 0  . pantoprazole (PROTONIX) 40 MG tablet TAKE 1 TABLET(40 MG) BY MOUTH DAILY 90 tablet 0  . rOPINIRole (REQUIP) 1 MG tablet TAKE 1 TABLET BY MOUTH EVERY NIGHT AT BEDTIME 90 tablet 0  . Solifenacin Succinate (VESICARE PO) Take 1 tablet  by mouth daily.    . SYRINGE-NEEDLE, DISP, 3 ML (BD ECLIPSE SYRINGE) 25G X 1" 3 ML MISC Inject 91ml monthly of B12 30 each 0  . traMADol (ULTRAM) 50 MG tablet TAKE 1/2 TO 1 TABLET BY MOUTH TWICE DAILY AS NEEDED FOR SEVERE PAIN 60 tablet 0   No current facility-administered medications on file prior to visit.      Objective:  Objective  Physical Exam  Constitutional: She is oriented to person, place, and time. She appears well-developed and well-nourished. No distress.  HENT:  Right Ear: External ear normal.  Left Ear: External ear normal.  Nose: Nose normal.  Mouth/Throat: Oropharynx is clear and moist.  Eyes: Pupils are equal, round, and reactive to light. EOM are normal.  Neck: Normal range of motion. Neck supple.  Cardiovascular: Normal rate, regular rhythm and normal heart sounds.  No murmur heard. Pulmonary/Chest: Effort normal and breath sounds normal. No respiratory distress. She has no wheezes. She has no rales. She exhibits no tenderness.  Neurological: She is alert and oriented to person, place, and time.  Psychiatric: She has a normal mood and affect. Her behavior is normal. Judgment and thought content normal.  Nursing note and vitals reviewed.  BP 110/70 (BP Location: Left Arm, Cuff Size: Normal)   Pulse 91   Temp 98.1 F (36.7 C) (Oral)   Resp 16   Ht 5\' 6"  (1.676 m)   Wt 198 lb (89.8 kg)   SpO2 93%   BMI 31.96 kg/m  Wt Readings from Last 3 Encounters:  09/22/17 198 lb (89.8 kg)  06/13/17 203 lb (92.1 kg)  05/08/17 201 lb (91.2 kg)     Lab Results  Component Value Date   WBC 5.1 09/22/2017   HGB 13.6 09/22/2017   HCT 39.4 09/22/2017   PLT 347.0 09/22/2017   GLUCOSE 98 09/22/2017   CHOL 247 (H) 09/22/2017   TRIG 165.0 (H) 09/22/2017   HDL 62.90 09/22/2017   LDLDIRECT 144.6 09/10/2011   LDLCALC 151 (H) 09/22/2017   ALT 19 09/22/2017   AST 18 09/22/2017   NA 139 09/22/2017   K 3.8 09/22/2017   CL 103 09/22/2017   CREATININE 0.85 09/22/2017   BUN  16 09/22/2017   CO2 25 09/22/2017   TSH 1.700 06/13/2017   INR 1.27 10/18/2013   HGBA1C 5.7 06/02/2006    Ct Angio Chest Pe W Or Wo Contrast  Result Date: 06/06/2017 CLINICAL DATA:  Evaluate for acute pulmonary embolus. EXAM: CT ANGIOGRAPHY CHEST WITH CONTRAST TECHNIQUE: Multidetector CT imaging of the chest was performed using the standard protocol during bolus administration of intravenous contrast. Multiplanar CT image reconstructions and MIPs were obtained to evaluate the vascular anatomy. CONTRAST:  176mL ISOVUE-370 IOPAMIDOL (ISOVUE-370) INJECTION 76% COMPARISON:  10/14/2013 FINDINGS: Cardiovascular: Satisfactory opacification of the pulmonary arteries to the segmental level. No evidence of pulmonary embolism. Normal heart size. No pericardial effusion. Mediastinum/Nodes: No enlarged mediastinal, hilar, or axillary lymph nodes. Small hiatal hernia. The trachea appears patent and is midline. Lungs/Pleura: No airspace consolidation or atelectasis.  No suspicious nodules or masses. Upper Abdomen: No acute abnormality. Musculoskeletal: Degenerative disc disease identified within the lower thoracic spine. Posterior hardware fixation within the lower thoracic/ lumbar spine noted. Review of the MIP images confirms the above findings. IMPRESSION: 1. No evidence for acute pulmonary emboli. 2. No acute cardiopulmonary abnormalities noted. Electronically Signed   By: Kerby Moors M.D.   On: 06/06/2017 13:26   US Venous Img Lower Unilateral Left  Result Date: 06/06/2017 CLINICAL DATA:  Left lower extremity pain and edema. History of prior DVT, pulmonary embolism and IVC filter placement. Evaluate for DVT. EXAM: LEFT LOWER EXTREMITY VENOUS DOPPLER ULTRASOUND TECHNIQUE: Gray-scale sonography with graded compression, as well as color Doppler and duplex ultrasound were performed to evaluate the lower extremity deep venous systems from the level of the common femoral vein and including the common femoral, femoral,  profunda femoral, popliteal and calf veins including the posterior tibial, peroneal and gastrocnemius veins when visible. The superficial great saphenous vein was also interrogated. Spectral Doppler was utilized to evaluate flow at rest and with distal augmentation maneuvers in the common femoral, femoral and popliteal veins. COMPARISON:  Chest CTA - 06/06/2017 FINDINGS: Contralateral Common Femoral Vein: Respiratory phasicity is normal and symmetric with the symptomatic side. No evidence of thrombus. Normal compressibility. Common Femoral Vein: No evidence of thrombus. Normal compressibility, respiratory phasicity and response to augmentation. Saphenofemoral Junction: No evidence of thrombus. Normal compressibility and flow on color Doppler imaging. Profunda Femoral Vein: No evidence of thrombus. Normal compressibility and flow on color Doppler imaging. Femoral Vein: No evidence of thrombus. Normal compressibility, respiratory phasicity and response to augmentation. Popliteal Vein: No evidence of thrombus. Normal compressibility, respiratory phasicity and response to augmentation. Calf Veins: No evidence of thrombus. Normal compressibility and flow on color Doppler imaging. Superficial Great Saphenous Vein: No evidence of thrombus. Normal compressibility. Venous Reflux:  None. Other Findings:  None. IMPRESSION: No evidence of DVT within the left lower extremity. Electronically Signed   By: Sandi Mariscal M.D.   On: 06/06/2017 14:53     Assessment & Plan:  Plan  I have discontinued Lawsyn L. Moen's XARELTO and XARELTO. I am also having her maintain her albuterol, calcium-vitamin D, citalopram, cyanocobalamin, fluticasone, Solifenacin Succinate (VESICARE PO), SYRINGE-NEEDLE (DISP) 3 ML, apixaban, pantoprazole, losartan-hydrochlorothiazide, levocetirizine, rOPINIRole, MYRBETRIQ, traMADol, CANNABIDIOL PO, and LORazepam.  Meds ordered this encounter  Medications  . LORazepam (ATIVAN) 1 MG tablet    Sig: Take 1  tablet (1 mg total) by mouth at bedtime.    Dispense:  90 tablet    Refill:  0    Problem List Items Addressed This Visit      Unprioritized   Essential hypertension, benign    Well controlled, no changes to meds. Encouraged heart healthy diet such as the DASH diet and exercise as tolerated.       GERD (gastroesophageal reflux disease)    Refill ppi stable      Hyperlipidemia LDL goal <100    Encouraged heart healthy diet, increase exercise, avoid trans fats, consider a krill oil cap daily       Other Visit Diagnoses    Essential hypertension    -  Primary   Relevant Orders   CBC with Differential/Platelet (Completed)   Comprehensive metabolic panel (Completed)   Lipid panel (Completed)   Insomnia, unspecified type       Relevant Medications   LORazepam (ATIVAN) 1 MG tablet      Follow-up: Return in about 6 months (around 03/24/2018)  for annual wellness visit with nurse.  Ann Held, DO

## 2017-09-22 NOTE — Patient Instructions (Signed)

## 2017-09-22 NOTE — Assessment & Plan Note (Signed)
Well controlled, no changes to meds. Encouraged heart healthy diet such as the DASH diet and exercise as tolerated.  °

## 2017-09-22 NOTE — Assessment & Plan Note (Signed)
Refill ppi stable

## 2017-09-26 ENCOUNTER — Other Ambulatory Visit: Payer: Self-pay | Admitting: Family Medicine

## 2017-09-29 ENCOUNTER — Telehealth: Payer: Self-pay | Admitting: Family Medicine

## 2017-09-29 NOTE — Telephone Encounter (Signed)
Copied from [place] [phone]. Topic: Quick Communication - Lab Results >> Sep 26, 2017 11:05 AM Bartholome Bill, RMA wrote: Called patient to inform them of 09/22/17 lab results. When patient returns call, triage nurse may disclose results. >> Sep 29, 2017  9:22 AM Conception Chancy, NT wrote: Please contact pt/

## 2017-09-29 NOTE — Telephone Encounter (Signed)
Patient notified of lab results

## 2017-10-20 ENCOUNTER — Other Ambulatory Visit: Payer: Self-pay | Admitting: Family Medicine

## 2017-10-20 DIAGNOSIS — G2581 Restless legs syndrome: Secondary | ICD-10-CM

## 2017-10-20 DIAGNOSIS — J069 Acute upper respiratory infection, unspecified: Secondary | ICD-10-CM

## 2017-10-20 DIAGNOSIS — R32 Unspecified urinary incontinence: Secondary | ICD-10-CM

## 2017-10-23 ENCOUNTER — Other Ambulatory Visit: Payer: Self-pay | Admitting: Family Medicine

## 2017-10-23 DIAGNOSIS — K219 Gastro-esophageal reflux disease without esophagitis: Secondary | ICD-10-CM

## 2017-11-03 ENCOUNTER — Other Ambulatory Visit: Payer: Self-pay | Admitting: Family Medicine

## 2017-11-03 DIAGNOSIS — G2581 Restless legs syndrome: Secondary | ICD-10-CM

## 2017-11-03 NOTE — Telephone Encounter (Signed)
Requesting: ULTRAM 50 MG Contract:06/06/17 UDS:06/06/17 Last OV:09/22/17 Next OV:03/23/18 Last Refill:09/09/17   Please advise

## 2017-11-22 ENCOUNTER — Other Ambulatory Visit: Payer: Self-pay | Admitting: Family Medicine

## 2017-12-11 DIAGNOSIS — Z981 Arthrodesis status: Secondary | ICD-10-CM | POA: Diagnosis not present

## 2017-12-11 DIAGNOSIS — M545 Low back pain: Secondary | ICD-10-CM | POA: Diagnosis not present

## 2017-12-11 DIAGNOSIS — Z4789 Encounter for other orthopedic aftercare: Secondary | ICD-10-CM | POA: Diagnosis not present

## 2017-12-16 DIAGNOSIS — Z961 Presence of intraocular lens: Secondary | ICD-10-CM | POA: Diagnosis not present

## 2017-12-16 DIAGNOSIS — H5212 Myopia, left eye: Secondary | ICD-10-CM | POA: Diagnosis not present

## 2017-12-23 ENCOUNTER — Other Ambulatory Visit: Payer: Self-pay | Admitting: Family Medicine

## 2017-12-23 DIAGNOSIS — G2581 Restless legs syndrome: Secondary | ICD-10-CM

## 2017-12-23 DIAGNOSIS — G4700 Insomnia, unspecified: Secondary | ICD-10-CM

## 2017-12-25 ENCOUNTER — Ambulatory Visit (INDEPENDENT_AMBULATORY_CARE_PROVIDER_SITE_OTHER): Payer: Medicare Other | Admitting: Podiatry

## 2017-12-25 ENCOUNTER — Encounter: Payer: Self-pay | Admitting: Podiatry

## 2017-12-25 DIAGNOSIS — L600 Ingrowing nail: Secondary | ICD-10-CM | POA: Diagnosis not present

## 2017-12-25 DIAGNOSIS — M79674 Pain in right toe(s): Secondary | ICD-10-CM

## 2017-12-25 DIAGNOSIS — G8929 Other chronic pain: Secondary | ICD-10-CM | POA: Diagnosis not present

## 2017-12-25 NOTE — Patient Instructions (Signed)

## 2017-12-25 NOTE — Telephone Encounter (Signed)
Pt is requesting refill on tramadol and lorazepam.   Last OV: 09/22/2017 Last Fill on tramadol: 11/03/2017 #60 and 0rf Last Fill on lorazepam: 09/22/2017 #90 and 2OO UDS: 06/06/2017 Low risk CSC: 06/06/2017

## 2017-12-25 NOTE — Progress Notes (Signed)
Subjective:    Patient ID: Felicia Cruz, female    DOB: 03-Feb-1941, 77 y.o.   MRN: [id number]  HPI  77 year old female presents the office today for concerns of a painful ingrown toenail to the right big toe, pointing the lateral aspect.  She states the area is been very painful with pressure in shoes.  She has had just the corner taken off today and she does not want the entire toenail taken off.  She denies any drainage or pus coming from the area.  She has no other concerns today.  Review of Systems  All other systems reviewed and are negative.  Past Medical History:  Diagnosis Date  . Alcoholism ([place])    HAS NOT HAD [place]  . Arthritis   . Asthma   . B12 deficiency   . Cataract   . Chronic back pain   . DDD (degenerative disc disease), lumbar   . Esophageal stricture 2010  . GERD (gastroesophageal reflux disease)   . Hiatal hernia   . Hyperlipemia   . Hypertension   . Iron deficiency anemia due to chronic blood loss 06/23/2017  . Kidney stones   . Osteopenia   . 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 RLS (restless legs syndrome)   . Substance abuse ([place])    RECOVERING ALCOLHOLIC    Past Surgical History:  Procedure Laterality Date  . ABDOMINOPLASTY    . DILATION AND CURETTAGE OF UTERUS  many yrs ago   x 2  . ESOPHAGEAL MANOMETRY  02/17/2012   Procedure: ESOPHAGEAL MANOMETRY (EM);  Surgeon: Pedro Earls, MD;  Location: [facility];  Service: Endoscopy;  Laterality: N/A;  . FACIAL COSMETIC SURGERY     eyes, nose  . JOINT REPLACEMENT     left  . LAPAROSCOPIC NISSEN FUNDOPLICATION  6/38/7564   Procedure: LAPAROSCOPIC NISSEN FUNDOPLICATION;  Surgeon: Pedro Earls, MD;  Location: [facility];  Service: General;  Laterality: N/A;  Laparoscopic Nissen repair of hiatal hernia  with lighted bougie  . LIPOSUCTION  15 to 20 yrs ago  . [place] SURGERY  2008  . OPEN SURGICAL REPAIR OF GLUTEAL TENDON Right 12/28/2012   Procedure: RIGHT GLUTEAL  TENDON REPAIR;  Surgeon: Mauri Pole, MD;  Location: [facility];  Service: Orthopedics;  Laterality: Right;  . RIGHT INDEX FINGER SURGERY  06/19/11   AT [facility] [place]  . SURGERY FOR KIDNEY STONES    . 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   Procedure: TOTAL KNEE ARTHROPLASTY;  Surgeon: Mauri Pole, MD;  Location: [facility];  Service: Orthopedics;  Laterality: Left;     Current Outpatient Medications:  .  albuterol (VENTOLIN HFA) 108 (90 BASE) MCG/ACT inhaler, Inhale 2 puffs into the lungs every 4 (four) hours as needed for wheezing or shortness of breath (or cough)., Disp: 1 Inhaler, Rfl: 0 .  apixaban (ELIQUIS) 5 MG TABS tablet, Take 1 tablet (5 mg total) by mouth 2 (two) times daily., Disp: 60 tablet, Rfl: 12 .  calcium-vitamin D (CALCIUM 500/D) 500-200 MG-UNIT tablet, Take by mouth., Disp: , Rfl:  .  CANNABIDIOL PO, Take 300 mg by mouth 2 (two) times daily. cbd oil, Disp: , Rfl:  .  citalopram (CELEXA) 20 MG tablet, 1-1/2 tablets at bedtime, Disp: 150 tablet, Rfl: 3 .  cyanocobalamin (,VITAMIN B-12,) 1000 MCG/ML injection, INJECT 1 ML INTO THE MUSCLE ONCE A MONTH, Disp: , Rfl: 0 .  fluticasone (FLONASE) 50 MCG/ACT nasal  spray, Place 2 sprays into both nostrils daily., Disp: 16 g, Rfl: 6 .  levocetirizine (XYZAL) 5 MG tablet, TAKE 1 TABLET BY MOUTH EVERY EVENING, Disp: 90 tablet, Rfl: 0 .  LORazepam (ATIVAN) 1 MG tablet, TAKE 1 TABLET(1 MG) BY MOUTH AT BEDTIME, Disp: 90 tablet, Rfl: 0 .  losartan-hydrochlorothiazide (HYZAAR) 100-12.5 MG tablet, TAKE 1 TABLET BY MOUTH EVERY MORNING, Disp: 90 tablet, Rfl: 0 .  MYRBETRIQ 50 MG TB24 tablet, TAKE 1 TABLET BY MOUTH EVERY DAY, Disp: 90 tablet, Rfl: 0 .  pantoprazole (PROTONIX) 40 MG tablet, TAKE 1 TABLET(40 MG) BY MOUTH DAILY, Disp: 90 tablet, Rfl: 0 .  rOPINIRole (REQUIP) 1 MG tablet, TAKE 1 TABLET BY MOUTH EVERY NIGHT AT BEDTIME, Disp: 90 tablet, Rfl: 0 .  Solifenacin Succinate (VESICARE PO), Take 1 tablet by mouth daily., Disp: ,  Rfl:  .  SYRINGE-NEEDLE, DISP, 3 ML (BD [employer]RA SYRINGE) 25G X 1" 3 ML MISC, INJECT 1 ML MONTHLY OF B12, Disp: 30 each, Rfl: 0 .  traMADol (ULTRAM) 50 MG tablet, TAKE 1/2 TO 1 TABLET BY MOUTH TWICE DAILY AS NEEDED FOR SEVERE PAIN, Disp: 60 tablet, Rfl: 0  Allergies  Allergen Reactions  . Atorvastatin Other (See Comments)    Memory loss, confusion, hallucinations Amnesia  . Gabapentin Other (See Comments)    Chest pain  . Lyrica [Pregabalin] Other (See Comments)    Water blisters on face  . Penicillins Hives, Shortness Of Breath, Itching and Anaphylaxis  . Acyclovir And Related   . Famciclovir Other (See Comments)  . Latex Other (See Comments) and Rash    Skin peeling (thumb)  . Methocarbamol Other (See Comments)    Blisters on face         Objective:   Physical Exam  General: AAO x3, NAD  Dermatological: Incurvation present to the lateral aspect of the right hallux toenail there is tenderness palpation of the nail corner.  There is faint edema from inflammation but there is no significant erythema there is no ascending cellulitis.  There is no fluctuation or crepitation or any malodor.  There is no drainage or pus identified today.  No open lesions identified otherwise.  Vascular: Dorsalis Pedis artery and Posterior Tibial artery pedal pulses are 2/4 bilateral with immedate capillary fill time.  There is no pain with calf compression, swelling, warmth, erythema.   Neruologic: Sensation decreased with Derrel Nip monofilament  Musculoskeletal: No gross boney pedal deformities bilateral. No pain, crepitus, or limitation noted with foot and ankle range of motion bilateral.  Gait: Uses walker.    Assessment & Plan:  77 year old female right lateral hallux symptomatic ingrown toenail -Treatment options discussed including all alternatives, risks, and complications -Etiology of symptoms were discussed -At this time, the patient is requesting partial nail removal with  chemical matricectomy to the symptomatic portion of the nail. Risks and complications were discussed with the patient for which they understand and written consent was obtained. Under sterile conditions a total of 3 mL of a mixture of 2% lidocaine plain and 0.5% Marcaine plain was infiltrated in a hallux block fashion. Once anesthetized, the skin was prepped in sterile fashion. A tourniquet was then applied. Next the lateral aspect of hallux nail border was then sharply excised making sure to remove the entire offending nail border. Once the nails were ensured to be removed area was debrided and the underlying skin was intact. There is no purulence identified in the procedure. Next phenol was then applied under standard conditions and  copiously irrigated. Silvadene was applied. A dry sterile dressing was applied. After application of the dressing the tourniquet was removed and there is found to be an immediate capillary refill time to the digit. The patient tolerated the procedure well any complications. Post procedure instructions were discussed the patient for which he verbally understood. Follow-up in one week for nail check or sooner if any problems are to arise. Discussed signs/symptoms of infection and directed to call the office immediately should any occur or go directly to the emergency room. In the meantime, encouraged to call the office with any questions, concerns, changes symptoms.  Trula Slade DPM

## 2017-12-28 DIAGNOSIS — L600 Ingrowing nail: Secondary | ICD-10-CM | POA: Insufficient documentation

## 2017-12-31 ENCOUNTER — Other Ambulatory Visit: Payer: Self-pay

## 2018-01-01 ENCOUNTER — Ambulatory Visit (INDEPENDENT_AMBULATORY_CARE_PROVIDER_SITE_OTHER): Payer: Self-pay

## 2018-01-01 DIAGNOSIS — L600 Ingrowing nail: Secondary | ICD-10-CM

## 2018-01-01 NOTE — Patient Instructions (Signed)
Epsom Salt Soak Instructions    IF SOAKING IS STILL NECESSARY, START THIS 2 WEEKS AFTER INITIAL PROCEDURE   Place 1/4 cup of epsom salts in a quart of warm tap water.  Soak your foot or feet in the solution for 20 minutes twice a day until you notice the area has dried and a scab has formed. Continue to apply other medications to the area as directed by the doctor such as polysporin, neosporin or cortisporin drops. Bandage on during the day and off at night to promote healing.  IF YOUR SKIN BECOMES IRRITATED WHILE USING THESE INSTRUCTIONS, IT IS OKAY TO SWITCH TO  WHITE VINEGAR AND WATER. Or you may use antibacterial soap and water to keep the toe clean  Monitor for any signs/symptoms of infection. Call the office immediately if any occur or go directly to the emergency room. Call with any questions/concerns.

## 2018-01-02 NOTE — Progress Notes (Signed)
Patient is here today for follow-up appointment, she had no avulsion of her right hallux nail lateral border.  Procedure was performed 12/25/2017.  She states she is not having any pain with it and has been very active lately.  She does state that she only soaked her toe one time and that she was confused about the post nail care instructions.  Patient also states that she is not bandaging her toe and currently came in wearing an enclosed shoe with no bandage on her toe.  Noted well-healing surgical site.  No erythema, no redness, scant amount of drainage.  Drainage appeared to be clear yellowish in nature.  No swelling at this time no other signs and symptoms of infection.  Discussed importance of soaking her toe and Epson salt soaks until there is no redness, no pain, no drainage.  I also discussed with her importance of hygiene and keeping the area clean.  She is to keep Neosporin and bandage on during the day and leave area open to air at night.  She is to follow-up with any acute symptom changes or if she has any questions and concerns instructions were gone over in great detail and written copy was given to the patient.Marland Kitchen

## 2018-01-15 ENCOUNTER — Other Ambulatory Visit: Payer: Self-pay | Admitting: Family Medicine

## 2018-01-15 DIAGNOSIS — J069 Acute upper respiratory infection, unspecified: Secondary | ICD-10-CM

## 2018-01-21 ENCOUNTER — Other Ambulatory Visit: Payer: Self-pay | Admitting: Family Medicine

## 2018-01-21 DIAGNOSIS — K219 Gastro-esophageal reflux disease without esophagitis: Secondary | ICD-10-CM

## 2018-01-21 DIAGNOSIS — G2581 Restless legs syndrome: Secondary | ICD-10-CM

## 2018-01-23 ENCOUNTER — Other Ambulatory Visit: Payer: Self-pay | Admitting: Family Medicine

## 2018-01-23 DIAGNOSIS — R32 Unspecified urinary incontinence: Secondary | ICD-10-CM

## 2018-01-26 ENCOUNTER — Telehealth: Payer: Self-pay | Admitting: *Deleted

## 2018-01-26 NOTE — Telephone Encounter (Signed)
Dr Nani Ravens and Dr Carollee Herter -- please review and advise?

## 2018-01-26 NOTE — Telephone Encounter (Signed)
Copied from [place] [id number]. Topic: Appointment Scheduling - Scheduling Inquiry for Clinic >> Jan 26, 2018  1:02 PM Burchel, Abbi R wrote: Reason for CRM:   Pt would like to transfer care from Dr Carollee Herter to Dr  Nani Ravens before her 10/21 appt.  Please call pt to sched new pt/TOC appt  Pt: [phone] >> Jan 26, 2018  1:14 PM Ahmed Prima L wrote: Patient said nevermind, she does not want to see a DO. >> Jan 26, 2018  1:17 PM Vernona Rieger wrote: Patient would like to transfer her care to a doctor that does everything ( this is all she kept saying ) she would like to transfer to " Luetta Nutting " @ [facility].

## 2018-01-26 NOTE — Telephone Encounter (Signed)
Dr Carollee Herter -- It appears pt is now requesting to transfer care to Felicia Cruz at [facility].

## 2018-01-26 NOTE — Telephone Encounter (Signed)
That is fine 

## 2018-01-26 NOTE — Telephone Encounter (Signed)
I am not sure I would be the best fit for her. Also, I am a DO.

## 2018-02-18 DIAGNOSIS — Z85820 Personal history of malignant melanoma of skin: Secondary | ICD-10-CM | POA: Diagnosis not present

## 2018-02-18 DIAGNOSIS — L821 Other seborrheic keratosis: Secondary | ICD-10-CM | POA: Diagnosis not present

## 2018-02-18 DIAGNOSIS — D225 Melanocytic nevi of trunk: Secondary | ICD-10-CM | POA: Diagnosis not present

## 2018-02-18 DIAGNOSIS — Z85828 Personal history of other malignant neoplasm of skin: Secondary | ICD-10-CM | POA: Diagnosis not present

## 2018-02-18 DIAGNOSIS — L570 Actinic keratosis: Secondary | ICD-10-CM | POA: Diagnosis not present

## 2018-02-23 ENCOUNTER — Other Ambulatory Visit: Payer: Self-pay | Admitting: Family Medicine

## 2018-02-23 DIAGNOSIS — G2581 Restless legs syndrome: Secondary | ICD-10-CM

## 2018-02-23 NOTE — Telephone Encounter (Signed)
Copied from [place] [id number]. Topic: General - Other >> Feb 23, 2018 10:30 AM Carolyn Stare wrote:  Pt call and ask to transfer to [facility] Derrick said she was not happy with Dr Carollee Herter

## 2018-02-23 NOTE — Telephone Encounter (Signed)
Pt has asked to tranfer to several different providers----  I already ok it

## 2018-02-25 ENCOUNTER — Other Ambulatory Visit: Payer: Self-pay | Admitting: Family Medicine

## 2018-02-25 DIAGNOSIS — G2581 Restless legs syndrome: Secondary | ICD-10-CM

## 2018-02-25 NOTE — Telephone Encounter (Signed)
Pt is requesting refill on tramadol.   Last OV: 09/22/2017, appt 03/23/2018 Last Fill: 12/25/2017 #60 and 0RF UDS: 06/06/2017 Low risk

## 2018-03-09 ENCOUNTER — Other Ambulatory Visit: Payer: Self-pay | Admitting: Family Medicine

## 2018-03-11 ENCOUNTER — Telehealth: Payer: Self-pay

## 2018-03-11 NOTE — Telephone Encounter (Signed)
She already called about this--- I already said it was ok

## 2018-03-11 NOTE — Telephone Encounter (Signed)
Noted. Sorry  

## 2018-03-11 NOTE — Telephone Encounter (Signed)
Please advise 

## 2018-03-11 NOTE — Telephone Encounter (Signed)
Copied from [place]. Topic: General - Other >> Mar 11, 2018  9:05 AM Leward Quan A wrote: Reason for CRM: Patient called to be establish care with Dr Abelino Derrick she was scheduled for an appointment on 03/24/18. Wanted to know if that is ok. She only stated that she will not be going back to see Dr Etter Sjogren anymore.

## 2018-03-12 NOTE — Telephone Encounter (Signed)
Hi Kalyn, Don't think that I would be a good fit for this patient. Would she like to see one of the other providers at [facility]?

## 2018-03-18 ENCOUNTER — Encounter: Payer: Self-pay | Admitting: Family Medicine

## 2018-03-18 ENCOUNTER — Ambulatory Visit (INDEPENDENT_AMBULATORY_CARE_PROVIDER_SITE_OTHER): Payer: Medicare Other | Admitting: Family Medicine

## 2018-03-18 VITALS — BP 138/88 | HR 97 | Temp 98.0°F | Ht 66.0 in | Wt 202.0 lb

## 2018-03-18 DIAGNOSIS — Z23 Encounter for immunization: Secondary | ICD-10-CM

## 2018-03-18 DIAGNOSIS — Z7689 Persons encountering health services in other specified circumstances: Secondary | ICD-10-CM

## 2018-03-18 DIAGNOSIS — D500 Iron deficiency anemia secondary to blood loss (chronic): Secondary | ICD-10-CM | POA: Diagnosis not present

## 2018-03-18 DIAGNOSIS — M810 Age-related osteoporosis without current pathological fracture: Secondary | ICD-10-CM | POA: Diagnosis not present

## 2018-03-18 DIAGNOSIS — E785 Hyperlipidemia, unspecified: Secondary | ICD-10-CM | POA: Diagnosis not present

## 2018-03-18 DIAGNOSIS — E538 Deficiency of other specified B group vitamins: Secondary | ICD-10-CM

## 2018-03-18 DIAGNOSIS — I10 Essential (primary) hypertension: Secondary | ICD-10-CM | POA: Diagnosis not present

## 2018-03-18 LAB — VITAMIN B12: VITAMIN B 12: 701 pg/mL (ref 211–911)

## 2018-03-18 NOTE — Patient Instructions (Signed)

## 2018-03-18 NOTE — Addendum Note (Signed)
Addended by: Doy Hutching on: 03/18/2018 02:13 PM   Modules accepted: Orders

## 2018-03-18 NOTE — Progress Notes (Signed)
Felicia Cruz is a 77 y.o. female here to establish care with our office.  She does not need any medication refills. She had labs in 10/2017 but does appear to be due for B12 level and iron panel to f/u after transfusion in 06/2017.  She has HTN, hyperlipidemia, osteoporosis, iron deficiency anemia, b12 deficiency. She had Dexa in 07/2016 which showed osteoporosis but was not started on med at that time. She takes calcium and Vit D.  She has no concerns today.  She would like flu and Tdap vaccines.  Past Medical History:  Diagnosis Date  . Alcoholism ([place])    HAS NOT HAD Edgar  . Arthritis   . Asthma   . B12 deficiency   . Cataract   . Chronic back pain   . DDD (degenerative disc disease), lumbar   . Esophageal stricture 2010  . GERD (gastroesophageal reflux disease)   . Hiatal hernia   . Hyperlipemia   . Hypertension   . Iron deficiency anemia due to chronic blood loss 06/23/2017  . Kidney stones   . Osteopenia   . 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 RLS (restless legs syndrome)   . 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 ABDOMINOPLASTY    . DILATION AND CURETTAGE OF UTERUS  many yrs ago   x 2  . ESOPHAGEAL MANOMETRY  02/17/2012   Procedure: ESOPHAGEAL MANOMETRY (EM);  Surgeon: Pedro Earls, MD;  Location: [facility];  Service: Endoscopy;  Laterality: N/A;  . FACIAL COSMETIC SURGERY     eyes, nose  . JOINT REPLACEMENT     left  . LAPAROSCOPIC NISSEN FUNDOPLICATION  3/47/4259   Procedure: LAPAROSCOPIC NISSEN FUNDOPLICATION;  Surgeon: Pedro Earls, MD;  Location: [facility];  Service: General;  Laterality: N/A;  Laparoscopic Nissen repair of hiatal hernia  with lighted bougie  . LIPOSUCTION  15 to 20 yrs ago  . [place] SURGERY  2008  . OPEN SURGICAL REPAIR OF GLUTEAL TENDON Right 12/28/2012   Procedure: RIGHT GLUTEAL TENDON REPAIR;  Surgeon: Mauri Pole, MD;  Location: [facility];  Service: Orthopedics;  Laterality: Right;  . RIGHT INDEX FINGER SURGERY  06/19/11   AT [facility]  . SURGERY FOR KIDNEY STONES    . 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   Procedure: TOTAL KNEE ARTHROPLASTY;  Surgeon: Mauri Pole, MD;  Location: [facility];  Service: Orthopedics;  Laterality: Left;   Social History   Socioeconomic History  . Marital status: Widowed    Spouse name: Not on file  . Number of children: 0  . Years of education: Not on file  . Highest education level: Not on file  Occupational History  . Occupation: Retired  Scientific laboratory technician  . Financial resource strain: Not on file  . Food insecurity:    Worry: Not on file    Inability: Not on file  . Transportation needs:    Medical: Not on file    Non-medical: Not on file  Tobacco Use  . Smoking status: Former Smoker    Packs/day: 1.00    Years: 10.00    Pack years: 10.00    Types: Cigarettes    Last attempt to quit: 06/04/1971    Years since quitting: 46.8  . Smokeless tobacco: Never Used  Substance and Sexual Activity  . Alcohol use: No    Alcohol/week: 0.0 standard  drinks    Comment: RECOVERING ALCOLHOLIC  . Drug use: No  . Sexual activity: Not on file  Lifestyle  . Physical activity:    Days per week: Not on file    Minutes per session: Not on file  . Stress: Not on file  Relationships  . Social connections:    Talks on phone: Not on file    Gets together: Not on file    Attends religious service: Not on file    Active member of club or organization: Not on file    Attends meetings of clubs or organizations: Not on file    Relationship status: Not on file  . Intimate partner violence:    Fear of current or ex partner: Not on file    Emotionally abused: Not on file    Physically abused: Not on file    Forced sexual activity: Not on file  Other Topics Concern  . Not on file  Social History Narrative   Regular exercise-YES   Family History  Problem Relation Age of Onset  .  Throat cancer Father        Smoker  . Heart disease Mother   . Hypertension Mother   . Hip fracture Mother   . Alzheimer's disease Mother   . Stroke Mother   . Stroke Brother 44       smoker   Immunization History  Administered Date(s) Administered  . Influenza Split 03/31/2012  . Influenza Whole 03/09/2009, 03/19/2010  . Influenza, High Dose Seasonal PF 04/07/2013, 03/16/2014, 02/08/2015, 03/04/2016  . Pneumococcal Conjugate-13 09/09/2013  . Pneumococcal Polysaccharide-23 03/10/2008  . Td 06/09/2006  . Zoster 06/26/2006  . Zoster Recombinat (Shingrix) 09/01/2017   Outpatient Encounter Medications as of 03/18/2018  Medication Sig Note  . albuterol (VENTOLIN HFA) 108 (90 BASE) MCG/ACT inhaler Inhale 2 puffs into the lungs every 4 (four) hours as needed for wheezing or shortness of breath (or cough).   Marland Kitchen apixaban (ELIQUIS) 5 MG TABS tablet Take 1 tablet (5 mg total) by mouth 2 (two) times daily.   . calcium-vitamin D (CALCIUM 500/D) 500-200 MG-UNIT tablet Take by mouth. 01/05/2016: Received from: [facility]: Take 1 tablet by mouth 2 times daily with meals.  Marland Kitchen CANNABIDIOL PO Take 300 mg by mouth 2 (two) times daily. cbd oil   . citalopram (CELEXA) 20 MG tablet 1-1/2 tablets at bedtime   . cyanocobalamin (,VITAMIN B-12,) 1000 MCG/ML injection INJECT 1 ML INTO THE MUSCLE ONCE A MONTH 06/13/2016: Received from: External Pharmacy  . cyanocobalamin (,VITAMIN B-12,) 1000 MCG/ML injection INJECT 1 ML IN THE MUSCLE EVERY MONTH   . fluticasone (FLONASE) 50 MCG/ACT nasal spray Place 2 sprays into both nostrils daily.   Marland Kitchen levocetirizine (XYZAL) 5 MG tablet TAKE 1 TABLET BY MOUTH EVERY EVENING   . LORazepam (ATIVAN) 1 MG tablet TAKE 1 TABLET(1 MG) BY MOUTH AT BEDTIME   . losartan-hydrochlorothiazide (HYZAAR) 100-12.5 MG tablet TAKE 1 TABLET BY MOUTH EVERY MORNING   . MYRBETRIQ 50 MG TB24 tablet TAKE 1 TABLET BY MOUTH EVERY DAY   . MYRBETRIQ 50 MG TB24 tablet TAKE  1 TABLET BY MOUTH EVERY DAY   . pantoprazole (PROTONIX) 40 MG tablet TAKE 1 TABLET(40 MG) BY MOUTH DAILY   . rOPINIRole (REQUIP) 1 MG tablet TAKE 1 TABLET BY MOUTH EVERY NIGHT AT BEDTIME   . Solifenacin Succinate (VESICARE PO) Take 1 tablet by mouth daily.   . SYRINGE-NEEDLE, DISP, 3 ML (BD INTEGRA SYRINGE)  25G X 1" 3 ML MISC INJECT 1 ML MONTHLY OF B12   . traMADol (ULTRAM) 50 MG tablet TAKE 1/2 TO 1 TABLET BY MOUTH TWICE DAILY AS NEEDED FOR SEVERE PAIN    No facility-administered encounter medications on file as of 03/18/2018.    Review of Systems  Constitutional: Positive for malaise/fatigue. Negative for chills, fever and weight loss.  HENT: Negative for congestion, ear pain, hearing loss and sore throat.   Eyes: Negative for blurred vision and double vision.  Respiratory: Negative for cough, shortness of breath and wheezing.   Cardiovascular: Negative for chest pain, palpitations and leg swelling.  Gastrointestinal: Negative for abdominal pain, constipation, diarrhea, heartburn, nausea and vomiting.  Genitourinary: Positive for frequency and urgency (chronic, unchanged from baseline). Negative for dysuria.  Musculoskeletal: Positive for back pain and joint pain. Negative for falls.  Skin: Negative for itching and rash.  Neurological: Negative for dizziness, tingling, tremors and headaches.  Psychiatric/Behavioral: Negative for depression. The patient is nervous/anxious.    Allergies  Allergen Reactions  . Atorvastatin Other (See Comments)    Memory loss, confusion, hallucinations Amnesia  . Gabapentin Other (See Comments)    Chest pain  . Lyrica [Pregabalin] Other (See Comments)    Water blisters on face  . Penicillins Hives, Shortness Of Breath, Itching and Anaphylaxis  . Acyclovir And Related   . Famciclovir Other (See Comments)  . Latex Other (See Comments) and Rash    Skin peeling (thumb)  . Methocarbamol Other (See Comments)    Blisters on face   BP 138/88 (BP  Location: Left Arm, Patient Position: Sitting, Cuff Size: Normal)   Pulse 97   Temp 98 F (36.7 C) (Oral)   Ht 5\' 6"  (1.676 m)   Wt 202 lb (91.6 kg)   SpO2 97%   BMI 32.60 kg/m   General appearance: alert, appears stated age and no distress Head: Normocephalic, without obvious abnormality, atraumatic, sinuses nontender to percussion Ears: normal TM's and external ear canals both ears Nose: Nares normal. Septum midline. Mucosa normal. No drainage or sinus tenderness. Throat: lips, mucosa, and tongue normal; teeth and gums normal Neck: no adenopathy, no JVD, supple, symmetrical, trachea midline and thyroid not enlarged, symmetric, no tenderness/mass/nodules Lungs: clear to auscultation bilaterally Heart: regular rate and rhythm and S1, S2 normal Abdomen: soft, non-tender; bowel sounds normal; no masses,  no organomegaly Extremities: extremities normal, atraumatic, no cyanosis or edema Pulses: 2+ and symmetric Skin: Skin color, texture, turgor normal. No rashes or lesions Neurologic: Grossly normal, ambulates with cane, gait very slow   A/P: 1. Essential hypertension, benign   2. Encounter to establish care with new doctor   3. Hyperlipidemia LDL goal <100   4. Iron deficiency anemia due to chronic blood loss   5. B12 deficiency   6. Osteoporosis without current pathological fracture, unspecified osteoporosis type   - BP at goal, cont low salt diet and current med/dosages - flu and tdap vaccines today, needs shingrix #2 - will check B12 and iron panel today - dexa scan ordered - not on bisphosphonate, does take calcium and Vit D - reviewed most recent FLP - pt cannot tolerate statin (mental status changes) and was told not to take fish oil d/t being on eliquis. Discussed some dietary modifications that could be made. She is not able to exercise d/t h/o back surgery and side effects from that.  - f/u q71mo or PRN

## 2018-03-19 LAB — IRON,TIBC AND FERRITIN PANEL
%SAT: 40 % (ref 16–45)
FERRITIN: 88 ng/mL (ref 16–288)
IRON: 139 ug/dL (ref 45–160)
TIBC: 345 ug/dL (ref 250–450)

## 2018-03-19 NOTE — Addendum Note (Signed)
Addended by: Doy Hutching on: 03/19/2018 03:07 PM   Modules accepted: Orders

## 2018-03-23 ENCOUNTER — Ambulatory Visit: Payer: Medicare Other | Admitting: *Deleted

## 2018-03-23 ENCOUNTER — Ambulatory Visit: Payer: Medicare Other | Admitting: Family Medicine

## 2018-03-24 ENCOUNTER — Ambulatory Visit: Payer: Medicare Other | Admitting: Family Medicine

## 2018-03-30 ENCOUNTER — Other Ambulatory Visit: Payer: Self-pay | Admitting: Family Medicine

## 2018-03-30 DIAGNOSIS — G4700 Insomnia, unspecified: Secondary | ICD-10-CM

## 2018-03-30 NOTE — Telephone Encounter (Signed)
Please advise 

## 2018-03-31 NOTE — Telephone Encounter (Signed)
Database ran and is on your desk for review.  Last filled per database: 12/25/17 Last written: 12/25/17 Last ov: 09/25/17 Next ov: due Contract: 06/06/18 UDS: due

## 2018-04-10 ENCOUNTER — Encounter: Payer: Self-pay | Admitting: Family Medicine

## 2018-04-10 ENCOUNTER — Ambulatory Visit (HOSPITAL_BASED_OUTPATIENT_CLINIC_OR_DEPARTMENT_OTHER)
Admission: RE | Admit: 2018-04-10 | Discharge: 2018-04-10 | Disposition: A | Discharge status: Home / Self Care | Payer: Medicare Other | Source: Ambulatory Visit | Attending: Family Medicine | Admitting: Family Medicine

## 2018-04-10 DIAGNOSIS — Z13820 Encounter for screening for osteoporosis: Secondary | ICD-10-CM | POA: Insufficient documentation

## 2018-04-10 DIAGNOSIS — M810 Age-related osteoporosis without current pathological fracture: Secondary | ICD-10-CM | POA: Insufficient documentation

## 2018-04-10 DIAGNOSIS — M85852 Other specified disorders of bone density and structure, left thigh: Secondary | ICD-10-CM | POA: Diagnosis not present

## 2018-04-10 DIAGNOSIS — Z780 Asymptomatic menopausal state: Secondary | ICD-10-CM | POA: Diagnosis not present

## 2018-04-21 ENCOUNTER — Other Ambulatory Visit: Payer: Self-pay | Admitting: Family Medicine

## 2018-04-21 DIAGNOSIS — R32 Unspecified urinary incontinence: Secondary | ICD-10-CM

## 2018-04-21 DIAGNOSIS — J069 Acute upper respiratory infection, unspecified: Secondary | ICD-10-CM

## 2018-04-21 DIAGNOSIS — G2581 Restless legs syndrome: Secondary | ICD-10-CM

## 2018-04-21 DIAGNOSIS — K219 Gastro-esophageal reflux disease without esophagitis: Secondary | ICD-10-CM

## 2018-05-06 ENCOUNTER — Other Ambulatory Visit: Payer: Self-pay | Admitting: Family Medicine

## 2018-05-06 DIAGNOSIS — G2581 Restless legs syndrome: Secondary | ICD-10-CM

## 2018-05-06 NOTE — Telephone Encounter (Signed)
Requested medication (s) are due for refill today: yes  Requested medication (s) are on the active medication list: yes  Last refill:  02/26/18 #60  Future visit scheduled: no  Notes to clinic:  Controlled substance   Requested Prescriptions  Pending Prescriptions Disp Refills   traMADol (ULTRAM) 50 MG tablet 60 tablet 0     Not Delegated - Analgesics:  Opioid Agonists Failed - 05/06/2018 10:59 AM      Failed - This refill cannot be delegated      Failed - Urine Drug Screen completed in last 360 days.      Passed - Valid encounter within last 6 months    Recent Outpatient Visits          1 month ago Essential hypertension, benign   [facility], [place], DO   7 months ago Essential hypertension   [facility], DO   1 year ago Essential hypertension   [facility] [place], DO   1 year ago Urinary incontinence, unspecified type   [facility], DO   2 years ago B12 deficiency   [facility], Jory Ee, MD

## 2018-05-06 NOTE — Telephone Encounter (Signed)
Copied from [place] [phone]. Topic: General - Other >> May 06, 2018 10:35 AM Lennox Solders wrote: Reason for CRM:pt needs refill on tramadol. [facility] [address] in [place]. This med will be new from dr Bryan Lemma

## 2018-05-08 ENCOUNTER — Other Ambulatory Visit: Payer: Self-pay | Admitting: Family Medicine

## 2018-05-08 DIAGNOSIS — G2581 Restless legs syndrome: Secondary | ICD-10-CM

## 2018-05-08 NOTE — Telephone Encounter (Signed)
Requesting: Tramadol Contract:yes WTG:RMBOB one  Last OV:03/18/18 Next OV:not scheduled  Last Refill:02/26/18 #[id number]    Please advise

## 2018-05-13 NOTE — Telephone Encounter (Signed)
Rx refilled as requested. At next OV, pt will need UDS and controlled substance agreement

## 2018-05-13 NOTE — Telephone Encounter (Signed)
Medication Ultram sent via fax to [facility] in [place]

## 2018-05-13 NOTE — Telephone Encounter (Signed)
Pt calling to check on the status of this.  States she has been waiting quite a while to get it.  Pt wants to know when this can be expected to be filled. Pt can be reached at [phone]

## 2018-05-21 ENCOUNTER — Other Ambulatory Visit: Payer: Self-pay | Admitting: Hematology & Oncology

## 2018-06-24 ENCOUNTER — Other Ambulatory Visit: Payer: Self-pay | Admitting: Family Medicine

## 2018-06-24 ENCOUNTER — Other Ambulatory Visit: Payer: Self-pay

## 2018-06-24 DIAGNOSIS — G4700 Insomnia, unspecified: Secondary | ICD-10-CM

## 2018-06-24 MED ORDER — HYDROCHLOROTHIAZIDE 12.5 MG PO TABS: 12.5000 mg | ORAL_TABLET | Freq: Every day | ORAL | 3 refills | Status: DC

## 2018-06-24 MED ORDER — LOSARTAN POTASSIUM 100 MG PO TABS: 100.0000 mg | ORAL_TABLET | Freq: Every day | ORAL | 3 refills | Status: DC

## 2018-06-26 NOTE — Telephone Encounter (Signed)
Last lorazepam RX: 03/31/18, #90 Last OV:  Next OV: UDS: CSC: CSR:

## 2018-07-02 ENCOUNTER — Other Ambulatory Visit: Payer: Self-pay | Admitting: Family Medicine

## 2018-07-02 DIAGNOSIS — G4700 Insomnia, unspecified: Secondary | ICD-10-CM

## 2018-07-02 MED ORDER — LORAZEPAM 1 MG PO TABS: ORAL_TABLET | ORAL | 0 refills | Status: DC

## 2018-07-02 NOTE — Telephone Encounter (Signed)
I will refill for 90 day supply but would like pt to come in prior to next refill for OV as she needs UDS and controlled substance agreement signed/on file.

## 2018-07-02 NOTE — Telephone Encounter (Signed)
Called pt she is aware that to get a new refill she will need to make an OV and have a substance agreement signed.

## 2018-07-02 NOTE — Telephone Encounter (Signed)
Copied from [place] [phone]. Topic: Quick Communication - Rx Refill/Question >> Jul 02, 2018  1:28 PM Blase Mess A wrote: Medication: LORazepam (ATIVAN) 1 MG tablet [[id number]]   Has the patient contacted their pharmacy? Yes  (Agent: If no, request that the patient contact the pharmacy for the refill.) (Agent: If yes, when and what did the pharmacy advise?)  Preferred Pharmacy (with phone number or street name): [id number] - [place], [address] AT [address] [phone] (Phone) [phone] (Fax)    Agent: Please be advised that RX refills may take up to 3 business days. We ask that you follow-up with your pharmacy.

## 2018-07-02 NOTE — Telephone Encounter (Signed)
Dr. Loletha Grayer please advise pt is requesting rx refill for Lorazepam (ativan) 1 mg 1 tab PO at [facility]. Last refilled 03/31/2018 by Dr. Etter Sjogren 90 tabs with no refills. You have never prescribed this medication.

## 2018-07-20 ENCOUNTER — Other Ambulatory Visit: Payer: Self-pay | Admitting: Family Medicine

## 2018-07-20 DIAGNOSIS — K219 Gastro-esophageal reflux disease without esophagitis: Secondary | ICD-10-CM

## 2018-07-20 DIAGNOSIS — J069 Acute upper respiratory infection, unspecified: Secondary | ICD-10-CM

## 2018-07-20 DIAGNOSIS — G2581 Restless legs syndrome: Secondary | ICD-10-CM

## 2018-07-20 DIAGNOSIS — R32 Unspecified urinary incontinence: Secondary | ICD-10-CM

## 2018-07-23 DIAGNOSIS — Z1231 Encounter for screening mammogram for malignant neoplasm of breast: Secondary | ICD-10-CM | POA: Diagnosis not present

## 2018-08-19 ENCOUNTER — Ambulatory Visit: Payer: Medicare Other | Admitting: *Deleted

## 2018-09-01 NOTE — Progress Notes (Signed)
I connected with Tashena on 09/02/18 at  2:00 PM EDT by an enabled telemedicine application and verified that I am speaking with the correct person using two identifiers.   Subjective:   Felicia Cruz is a 78 y.o. female who presents for Medicare Annual (Subsequent) preventive examination.  Enjoys readings.  Review of Systems: No ROS.  Medicare Wellness Visit. Additional risk factors are reflected in the social history. Cardiac Risk Factors include: advanced age (>55men, >41 women);dyslipidemia;hypertension Sleep patterns: lorazepam at bedtime. Home Safety/Smoke Alarms: Feels safe in home. Smoke alarms in place.  Lives in retirement community. [place].   Female:        Mammo- 07/09/17      Dexa scan-  04/10/18      CCS- No longer doing routine screening due to age. Last 06/08/14.     Objective:     Vitals: UTA. Virtual visit/ covid 19  Advanced Directives 09/02/2018 05/08/2017 06/13/2016 01/24/2015 04/26/2014 04/08/2014 10/15/2013  Does Patient Have a Medical Advance Directive? Yes Yes Yes Yes No No;Yes Patient has advance directive, copy in chart  Type of Advance Directive [place];Living will [place];Living will [place];Living will [place]  Does patient want to make changes to medical advance directive? No - Guardian declined - - No - Patient declined - - No change requested  Copy of [place] in Chart? No - copy requested - No - copy requested - - No - copy requested -  Would patient like information on creating a medical advance directive? - - - - No - patient declined information - -  Pre-existing out of facility DNR order (yellow form or pink MOST form) - - - - - - No    Tobacco Social History   Tobacco Use  Smoking Status Former Smoker  . Packs/day: 1.00  . Years: 10.00  . Pack years: 10.00   . Types: Cigarettes  . Last attempt to quit: 06/04/1971  . Years since quitting: 84.2  Smokeless Tobacco Never Used     Counseling given: Not Answered   Clinical Intake:     Pain : No/denies pain    Past Medical History:  Diagnosis Date  . Alcoholism ([place])    HAS NOT HAD [place]  . Arthritis   . Asthma   . B12 deficiency   . Cataract   . Chronic back pain   . DDD (degenerative disc disease), lumbar   . Esophageal stricture 2010  . GERD (gastroesophageal reflux disease)   . Hiatal hernia   . Hyperlipemia   . Hypertension   . Iron deficiency anemia due to chronic blood loss 06/23/2017  . Kidney stones   . Osteopenia   . 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 RLS (restless legs syndrome)   . 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 ABDOMINOPLASTY    . DILATION AND CURETTAGE OF UTERUS  many yrs ago   x 2  . ESOPHAGEAL MANOMETRY  02/17/2012   Procedure: ESOPHAGEAL MANOMETRY (EM);  Surgeon: Pedro Earls, MD;  Location: [facility];  Service: Endoscopy;  Laterality: N/A;  . FACIAL COSMETIC SURGERY     eyes, nose  . JOINT REPLACEMENT     left  . LAPAROSCOPIC NISSEN FUNDOPLICATION  7/61/9509   Procedure: LAPAROSCOPIC  NISSEN FUNDOPLICATION;  Surgeon: Pedro Earls, MD;  Location: [facility];  Service: General;  Laterality: N/A;  Laparoscopic Nissen repair of hiatal hernia  with lighted bougie  . LIPOSUCTION  15 to 20 yrs ago  . [place] SURGERY  2008  . OPEN SURGICAL REPAIR OF GLUTEAL TENDON Right 12/28/2012   Procedure: RIGHT GLUTEAL TENDON REPAIR;  Surgeon: Mauri Pole, MD;  Location: [facility];  Service: Orthopedics;  Laterality: Right;  . RIGHT INDEX FINGER SURGERY  06/19/11   AT [facility]  . SURGERY FOR KIDNEY STONES    . 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 Throat cancer Father        Smoker  . Heart disease Mother   . Hypertension Mother   . Hip fracture Mother   . Alzheimer's disease Mother   . Stroke Mother   . Stroke Brother 80       smoker   Social History   Socioeconomic History  . Marital status: Widowed    Spouse name: Not on file  . Number of children: 0  . Years of education: Not on file  . Highest education level: Not on file  Occupational History  . Occupation: Retired  Scientific laboratory technician  . Financial resource strain: Not on file  . Food insecurity:    Worry: Not on file    Inability: Not on file  . Transportation needs:    Medical: Not on file    Non-medical: Not on file  Tobacco Use  . Smoking status: Former Smoker    Packs/day: 1.00    Years: 10.00    Pack years: 10.00    Types: Cigarettes    Last attempt to quit: 06/04/1971    Years since quitting: 47.2  . Smokeless tobacco: Never Used  Substance and Sexual Activity  . Alcohol use: No    Alcohol/week: 0.0 standard drinks    Comment: RECOVERING ALCOLHOLIC  . Drug use: No  . Sexual activity: Not on file  Lifestyle  . Physical activity:    Days per week: Not on file    Minutes per session: Not on file  . Stress: Not on file  Relationships  . Social connections:    Talks on phone: Not on file    Gets together: Not on file    Attends religious service: Not on file    Active member of club or organization: Not on file    Attends meetings of clubs or organizations: Not on file    Relationship status: Not on file  Other Topics Concern  . Not on file  Social History Narrative   Regular exercise-YES    Outpatient Encounter Medications as of 09/02/2018  Medication Sig  . calcium-vitamin D (CALCIUM 500/D) 500-200 MG-UNIT tablet Take by mouth.  Marland Kitchen CANNABIDIOL PO Take 300 mg by mouth 2 (two) times daily. cbd oil  . citalopram (CELEXA) 20 MG tablet 1-1/2 tablets at bedtime  .  cyanocobalamin (,VITAMIN B-12,) 1000 MCG/ML injection INJECT 1 ML INTO THE MUSCLE ONCE A MONTH  . cyanocobalamin (,VITAMIN B-12,) 1000 MCG/ML injection INJECT 1 ML IN THE MUSCLE EVERY MONTH  . ELIQUIS 5 MG TABS tablet TAKE 1 TABLET(5 MG) BY MOUTH TWICE DAILY  . fluticasone (FLONASE) 50 MCG/ACT nasal spray Place 2 sprays into both nostrils  daily.  . hydrochlorothiazide (HYDRODIURIL) 12.5 MG tablet Take 1 tablet (12.5 mg total) by mouth daily.  Marland Kitchen levocetirizine (XYZAL) 5 MG tablet TAKE 1 TABLET BY MOUTH EVERY EVENING  . LORazepam (ATIVAN) 1 MG tablet TAKE 1 TABLET(1 MG) BY MOUTH AT BEDTIME  . losartan (COZAAR) 100 MG tablet Take 1 tablet (100 mg total) by mouth daily.  Marland Kitchen MYRBETRIQ 50 MG TB24 tablet TAKE 1 TABLET BY MOUTH EVERY DAY  . pantoprazole (PROTONIX) 40 MG tablet TAKE 1 TABLET(40 MG) BY MOUTH DAILY  . rOPINIRole (REQUIP) 1 MG tablet TAKE 1 TABLET BY MOUTH EVERY NIGHT AT BEDTIME  . SYRINGE-NEEDLE, DISP, 3 ML (BD INTEGRA SYRINGE) 25G X 1" 3 ML MISC INJECT 1 ML MONTHLY OF B12  . traMADol (ULTRAM) 50 MG tablet TAKE 1/2 TO 1 TABLET BY MOUTH TWICE DAILY AS NEEDED FOR SEVERE PAIN  . albuterol (VENTOLIN HFA) 108 (90 BASE) MCG/ACT inhaler Inhale 2 puffs into the lungs every 4 (four) hours as needed for wheezing or shortness of breath (or cough). (Patient not taking: Reported on 09/02/2018)  . [DISCONTINUED] losartan-hydrochlorothiazide (HYZAAR) 100-12.5 MG tablet TAKE 1 TABLET BY MOUTH EVERY MORNING  . [DISCONTINUED] MYRBETRIQ 50 MG TB24 tablet TAKE 1 TABLET BY MOUTH EVERY DAY   No facility-administered encounter medications on file as of 09/02/2018.     Activities of Daily Living In your present state of health, do you have any difficulty performing the following activities: 09/02/2018  Hearing? N  Vision? N  Comment wears readers. hx cataract sx  Difficulty concentrating or making decisions? N  Walking or climbing stairs? N  Comment uses walker  Dressing or bathing? N  Doing errands, shopping? N   Preparing Food and eating ? N  Using the Toilet? N  In the past six months, have you accidently leaked urine? Y  Do you have problems with loss of bowel control? N  Managing your Medications? N  Managing your Finances? N  Housekeeping or managing your Housekeeping? N  Some recent data might be hidden    Patient Care Team: Ronnald Nian, DO as PCP - General (Family Medicine) Griselda Miner, MD as Consulting Physician (Dermatology) Volanda Napoleon, MD as Consulting Physician (Oncology)    Assessment:   This is a routine wellness examination for Breelynn. Physical assessment deferred to PCP.  Exercise Activities and Dietary recommendations Current Exercise Habits: The patient does not participate in regular exercise at present, Type of exercise: stretching, Time (Minutes): 10, Frequency (Times/Week): 7, Weekly Exercise (Minutes/Week): 70, Intensity: Mild, Exercise limited by: None identified Diet (meal preparation, eat out, water intake, caffeinated beverages, dairy products, fruits and vegetables): well balanced, on average, 2-3 meals per day. Drinks a lot of water.      Goals    . Continue to eat a healthy diet and drink plenty of water.       Fall Risk Fall Risk  09/02/2018 12/31/2017 06/13/2016 06/13/2016 03/04/2016  Falls in the past year? 0 No Yes Yes Yes  Comment - Emmi Telephone Survey: data to providers prior to load - - -  Number falls in past yr: - - 2 or more 2 or more 2 or more  Injury with Fall? - - No No No  Risk for fall due to : - - Impaired balance/gait - -  Follow up - - Education provided;Falls prevention discussed - -   Depression Screen PHQ 2/9 Scores 09/02/2018 06/13/2016 06/13/2016 03/04/2016  PHQ - 2 Score 0 0 0 0  Cognitive Function Ad8 score reviewed for issues:  Issues making decisions:no  Less interest in hobbies / activities:no  Repeats questions, stories (family complaining):no  Trouble using ordinary gadgets (microwave, computer,  phone):no  Forgets the month or year: no  Mismanaging finances: no  Remembering appts:no Daily problems with thinking and/or memory:no Ad8 score is=0   MMSE - Mini Mental State Exam 06/13/2016  Orientation to time 5  Orientation to Place 5  Registration 3  Attention/ Calculation 5  Recall 3  Language- name 2 objects 2  Language- repeat 1  Language- follow 3 step command 3  Language- read & follow direction 1  Write a sentence 1  Copy design 1  Total score 30        Immunization History  Administered Date(s) Administered  . Influenza Split 03/31/2012  . Influenza Whole 03/09/2009, 03/19/2010  . Influenza, High Dose Seasonal PF 04/07/2013, 03/16/2014, 02/08/2015, 03/04/2016, 03/18/2018  . Pneumococcal Conjugate-13 09/09/2013  . Pneumococcal Polysaccharide-23 03/10/2008  . Td 06/09/2006  . Tdap 03/18/2018  . Zoster 06/26/2006  . Zoster Recombinat (Shingrix) 09/01/2017   Screening Tests Health Maintenance  Topic Date Due  . MAMMOGRAM  07/09/2018  . INFLUENZA VACCINE  01/02/2019  . DEXA SCAN  04/10/2020  . TETANUS/TDAP  03/18/2028  . PNA vac Low Risk Adult  Completed       Plan:    Please schedule your next medicare wellness visit with me in 1 yr.  Continue to eat heart healthy diet (full of fruits, vegetables, whole grains, lean protein, water--limit salt, fat, and sugar intake) and increase physical activity as tolerated.  Continue doing brain stimulating activities (puzzles, reading, adult coloring books, staying active) to keep memory sharp.   Bring a copy of your living will and/or healthcare power of attorney to your next office visit.     I have personally reviewed and noted the following in the patient's chart:   . Medical and social history . Use of alcohol, tobacco or illicit drugs  . Current medications and supplements . Functional ability and status . Nutritional status . Physical activity . Advanced directives . List of other physicians .  Hospitalizations, surgeries, and ER visits in previous 12 months . Vitals . Screenings to include cognitive, depression, and falls . Referrals and appointments  In addition, I have reviewed and discussed with patient certain preventive protocols, quality metrics, and best practice recommendations. A written personalized care plan for preventive services as well as general preventive health recommendations were provided to patient.     Shela Nevin, [place]  09/02/2018

## 2018-09-02 ENCOUNTER — Encounter: Payer: Self-pay | Admitting: *Deleted

## 2018-09-02 ENCOUNTER — Other Ambulatory Visit: Payer: Self-pay | Admitting: Family Medicine

## 2018-09-02 ENCOUNTER — Ambulatory Visit (INDEPENDENT_AMBULATORY_CARE_PROVIDER_SITE_OTHER): Payer: Medicare Other | Admitting: *Deleted

## 2018-09-02 DIAGNOSIS — Z0000 Encounter for general adult medical examination without abnormal findings: Secondary | ICD-10-CM | POA: Diagnosis not present

## 2018-09-02 DIAGNOSIS — G2581 Restless legs syndrome: Secondary | ICD-10-CM

## 2018-09-02 MED ORDER — TRAMADOL HCL 50 MG PO TABS: ORAL_TABLET | ORAL | 0 refills | Status: DC

## 2018-09-02 NOTE — Patient Instructions (Signed)
Please schedule your next medicare wellness visit with me in 1 yr.  Continue to eat heart healthy diet (full of fruits, vegetables, whole grains, lean protein, water--limit salt, fat, and sugar intake) and increase physical activity as tolerated.  Continue doing brain stimulating activities (puzzles, reading, adult coloring books, staying active) to keep memory sharp.   Bring a copy of your living will and/or healthcare power of attorney to your next office visit.   Felicia Cruz , Thank you for taking time to come for your Medicare Wellness Visit. I appreciate your ongoing commitment to your health goals. Please review the following plan we discussed and let me know if I can assist you in the future.   These are the goals we discussed: Goals    . Continue to eat a healthy diet and drink plenty of water.       This is a list of the screening recommended for you and due dates:  Health Maintenance  Topic Date Due  . Mammogram  07/09/2018  . Flu Shot  01/02/2019  . DEXA scan (bone density measurement)  04/10/2020  . Tetanus Vaccine  03/18/2028  . Pneumonia vaccines  Completed    Health Maintenance After Age 52 After age 74, you are at a higher risk for certain long-term diseases and infections as well as injuries from falls. Falls are a major cause of broken bones and head injuries in people who are older than age 56. Getting regular preventive care can help to keep you healthy and well. Preventive care includes getting regular testing and making lifestyle changes as recommended by your health care provider. Talk with your health care provider about:  Which screenings and tests you should have. A screening is a test that checks for a disease when you have no symptoms.  A diet and exercise plan that is right for you. What should I know about screenings and tests to prevent falls? Screening and testing are the best ways to find a health problem early. Early diagnosis and treatment give  you the best chance of managing medical conditions that are common after age 92. Certain conditions and lifestyle choices may make you more likely to have a fall. Your health care provider may recommend:  Regular vision checks. Poor vision and conditions such as cataracts can make you more likely to have a fall. If you wear glasses, make sure to get your prescription updated if your vision changes.  Medicine review. Work with your health care provider to regularly review all of the medicines you are taking, including over-the-counter medicines. Ask your health care provider about any side effects that may make you more likely to have a fall. Tell your health care provider if any medicines that you take make you feel dizzy or sleepy.  Osteoporosis screening. Osteoporosis is a condition that causes the bones to get weaker. This can make the bones weak and cause them to break more easily.  Blood pressure screening. Blood pressure changes and medicines to control blood pressure can make you feel dizzy.  Strength and balance checks. Your health care provider may recommend certain tests to check your strength and balance while standing, walking, or changing positions.  Foot health exam. Foot pain and numbness, as well as not wearing proper footwear, can make you more likely to have a fall.  Depression screening. You may be more likely to have a fall if you have a fear of falling, feel emotionally low, or feel unable to do activities that  you used to do.  Alcohol use screening. Using too much alcohol can affect your balance and may make you more likely to have a fall. What actions can I take to lower my risk of falls? General instructions  Talk with your health care provider about your risks for falling. Tell your health care provider if: ? You fall. Be sure to tell your health care provider about all falls, even ones that seem minor. ? You feel dizzy, sleepy, or off-balance.  Take over-the-counter  and prescription medicines only as told by your health care provider. These include any supplements.  Eat a healthy diet and maintain a healthy weight. A healthy diet includes low-fat dairy products, low-fat (lean) meats, and fiber from whole grains, beans, and lots of fruits and vegetables. Home safety  Remove any tripping hazards, such as rugs, cords, and clutter.  Install safety equipment such as grab bars in bathrooms and safety rails on stairs.  Keep rooms and walkways well-lit. Activity   Follow a regular exercise program to stay fit. This will help you maintain your balance. Ask your health care provider what types of exercise are appropriate for you.  If you need a cane or walker, use it as recommended by your health care provider.  Wear supportive shoes that have nonskid soles. Lifestyle  Do not drink alcohol if your health care provider tells you not to drink.  If you drink alcohol, limit how much you have: ? 0-1 drink a day for women. ? 0-2 drinks a day for men.  Be aware of how much alcohol is in your drink. In the [place] one drink equals one typical bottle of beer (12 oz), one-half glass of wine (5 oz), or one shot of hard liquor (1 oz).  Do not use any products that contain nicotine or tobacco, such as cigarettes and e-cigarettes. If you need help quitting, ask your health care provider. Summary  Having a healthy lifestyle and getting preventive care can help to protect your health and wellness after age 20.  Screening and testing are the best way to find a health problem early and help you avoid having a fall. Early diagnosis and treatment give you the best chance for managing medical conditions that are more common for people who are older than age 26.  Falls are a major cause of broken bones and head injuries in people who are older than age 18. Take precautions to prevent a fall at home.  Work with your health care provider to learn what changes you can make to  improve your health and wellness and to prevent falls. This information is not intended to replace advice given to you by your health care provider. Make sure you discuss any questions you have with your health care provider. Document Released: 04/02/2017 Document Revised: 04/02/2017 Document Reviewed: 04/02/2017 Elsevier Interactive Patient Education  2019 [employer].

## 2018-09-09 NOTE — Progress Notes (Signed)
Reviewed and agree with AWV findings and documentation as completed by Naaman Plummer, RN.  Keep annual AWV for next year and routing f/u with PCP as scheduled.

## 2018-09-28 ENCOUNTER — Other Ambulatory Visit: Payer: Self-pay | Admitting: Family Medicine

## 2018-09-28 DIAGNOSIS — G4700 Insomnia, unspecified: Secondary | ICD-10-CM

## 2018-09-28 NOTE — Telephone Encounter (Signed)
Dr. Loletha Grayer please advise pt last OV was 03/18/18 last refill 1/30/202 #90 no refills

## 2018-09-30 ENCOUNTER — Ambulatory Visit (INDEPENDENT_AMBULATORY_CARE_PROVIDER_SITE_OTHER): Payer: Medicare Other | Admitting: Family Medicine

## 2018-09-30 ENCOUNTER — Encounter: Payer: Self-pay | Admitting: Family Medicine

## 2018-09-30 DIAGNOSIS — G4700 Insomnia, unspecified: Secondary | ICD-10-CM

## 2018-09-30 MED ORDER — LORAZEPAM 1 MG PO TABS: ORAL_TABLET | ORAL | 0 refills | Status: DC

## 2018-09-30 NOTE — Telephone Encounter (Signed)
Virtual visit 4/29

## 2018-09-30 NOTE — Telephone Encounter (Signed)
Please schedule virtual visit for today or tomorrow for controlled substance med f/u and refill

## 2018-09-30 NOTE — Progress Notes (Signed)
Virtual Visit via Video Note  I connected with Felicia Cruz on 09/30/18 at 10:00 AM EDT by a video enabled telemedicine application and verified that I am speaking with the correct person using two identifiers. Location patient: home Location provider: home office Persons participating in the virtual visit: patient, provider  I discussed the limitations of evaluation and management by telemedicine and the availability of in person appointments. The patient expressed understanding and agreed to proceed.  Chief Complaint  Patient presents with  . Medication Refill    med refill lorazepam /no vitals to give     HPI: Felicia Cruz is a 78 y.o. female for refill of her lorazepam 1mg  qHS. Pt has been on this med/dose for years for insomnia and come to our practice on this med. She has been taking it as prescribed without any side effects or issues. It is working well to help her sleep. She would like to continue taking it as prescribed.   Past Medical History:  Diagnosis Date  . Alcoholism ([place])    HAS NOT HAD [place]  . Arthritis   . Asthma   . B12 deficiency   . Cataract   . Chronic back pain   . DDD (degenerative disc disease), lumbar   . Esophageal stricture 2010  . GERD (gastroesophageal reflux disease)   . Hiatal hernia   . Hyperlipemia   . Hypertension   . Iron deficiency anemia due to chronic blood loss 06/23/2017  . Kidney stones   . Osteopenia   . 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 RLS (restless legs syndrome)   . 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 ABDOMINOPLASTY    . DILATION AND CURETTAGE OF UTERUS  many yrs ago   x 2  . ESOPHAGEAL MANOMETRY  02/17/2012   Procedure: ESOPHAGEAL MANOMETRY (EM);  Surgeon: Pedro Earls, MD;  [facility];  Service: Endoscopy;  Laterality: N/A;  . FACIAL COSMETIC SURGERY     eyes, nose  . JOINT REPLACEMENT     left  .  LAPAROSCOPIC NISSEN FUNDOPLICATION  01/13/7516   Procedure: LAPAROSCOPIC NISSEN FUNDOPLICATION;  Surgeon: Pedro Earls, MD;  [facility];  Service: General;  Laterality: N/A;  Laparoscopic Nissen repair of hiatal hernia  with lighted bougie  . LIPOSUCTION  15 to 20 yrs ago  . [place] SURGERY  2008  . OPEN SURGICAL REPAIR OF GLUTEAL TENDON Right 12/28/2012   Procedure: RIGHT GLUTEAL TENDON REPAIR;  Surgeon: Mauri Pole, MD;  [facility];  Service: Orthopedics;  Laterality: Right;  . RIGHT INDEX FINGER SURGERY  06/19/11   AT [facility]  . SURGERY FOR KIDNEY STONES    . 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   Procedure: TOTAL KNEE ARTHROPLASTY;  Surgeon: Mauri Pole, MD;  [facility];  Service: Orthopedics;  Laterality: Left;    Family History  Problem Relation Age of Onset  . Throat cancer Father        Smoker  . Heart disease Mother   . Hypertension Mother   . Hip fracture Mother   . Alzheimer's disease Mother   . Stroke Mother   . Stroke Brother 76       smoker    Social History   Tobacco Use  . Smoking status: Former Smoker    Packs/day: 1.00    Years: 10.00  Pack years: 10.00    Types: Cigarettes    Last attempt to quit: 06/04/1971    Years since quitting: 47.3  . Smokeless tobacco: Never Used  Substance Use Topics  . Alcohol use: No    Alcohol/week: 0.0 standard drinks    Comment: RECOVERING ALCOLHOLIC  . Drug use: No     Current Outpatient Medications:  .  albuterol (VENTOLIN HFA) 108 (90 BASE) MCG/ACT inhaler, Inhale 2 puffs into the lungs every 4 (four) hours as needed for wheezing or shortness of breath (or cough)., Disp: 1 Inhaler, Rfl: 0 .  calcium-vitamin D (CALCIUM 500/D) 500-200 MG-UNIT tablet, Take by mouth., Disp: , Rfl:  .  CANNABIDIOL PO, Take 300 mg by mouth 2 (two) times daily. cbd oil, Disp: , Rfl:  .  citalopram (CELEXA) 20 MG tablet, 1-1/2 tablets at bedtime, Disp: 150 tablet, Rfl: 3 .  cyanocobalamin  (,VITAMIN B-12,) 1000 MCG/ML injection, INJECT 1 ML INTO THE MUSCLE ONCE A MONTH, Disp: , Rfl: 0 .  cyanocobalamin (,VITAMIN B-12,) 1000 MCG/ML injection, INJECT 1 ML IN THE MUSCLE EVERY MONTH, Disp: 30 mL, Rfl: 0 .  ELIQUIS 5 MG TABS tablet, TAKE 1 TABLET(5 MG) BY MOUTH TWICE DAILY, Disp: 60 tablet, Rfl: 12 .  fluticasone (FLONASE) 50 MCG/ACT nasal spray, Place 2 sprays into both nostrils daily., Disp: 16 g, Rfl: 6 .  hydrochlorothiazide (HYDRODIURIL) 12.5 MG tablet, Take 1 tablet (12.5 mg total) by mouth daily., Disp: 90 tablet, Rfl: 3 .  levocetirizine (XYZAL) 5 MG tablet, TAKE 1 TABLET BY MOUTH EVERY EVENING, Disp: 90 tablet, Rfl: 0 .  LORazepam (ATIVAN) 1 MG tablet, TAKE 1 TABLET(1 MG) BY MOUTH AT BEDTIME, Disp: 90 tablet, Rfl: 0 .  losartan (COZAAR) 100 MG tablet, Take 1 tablet (100 mg total) by mouth daily., Disp: 90 tablet, Rfl: 3 .  MYRBETRIQ 50 MG TB24 tablet, TAKE 1 TABLET BY MOUTH EVERY DAY, Disp: 90 tablet, Rfl: 0 .  pantoprazole (PROTONIX) 40 MG tablet, TAKE 1 TABLET(40 MG) BY MOUTH DAILY, Disp: 90 tablet, Rfl: 0 .  rOPINIRole (REQUIP) 1 MG tablet, TAKE 1 TABLET BY MOUTH EVERY NIGHT AT BEDTIME, Disp: 90 tablet, Rfl: 0 .  SYRINGE-NEEDLE, DISP, 3 ML (BD INTEGRA SYRINGE) 25G X 1" 3 ML MISC, INJECT 1 ML MONTHLY OF B12, Disp: 30 each, Rfl: 0 .  traMADol (ULTRAM) 50 MG tablet, 1/2 to 1 tab po BID PRN, Disp: 60 tablet, Rfl: 0  Allergies  Allergen Reactions  . Atorvastatin Other (See Comments)    Memory loss, confusion, hallucinations Amnesia  . Gabapentin Other (See Comments)    Chest pain  . Lyrica [Pregabalin] Other (See Comments)    Water blisters on face  . Penicillins Hives, Shortness Of Breath, Itching and Anaphylaxis  . Acyclovir And Related   . Famciclovir Other (See Comments)  . Latex Other (See Comments) and Rash    Skin peeling (thumb)  . Methocarbamol Other (See Comments)    Blisters on face      ROS: See pertinent positives and negatives per  HPI.   EXAM:  VITALS per patient if applicable:  GENERAL: alert, oriented, appears well and in no acute distress  NECK: normal movements of the head and neck  LUNGS: on inspection no signs of respiratory distress, breathing rate appears normal, no obvious gross SOB, gasping or wheezing, no conversational dyspnea  CV: no obvious cyanosis  MS: moves all visible extremities without noticeable abnormality  PSYCH/NEURO: pleasant and cooperative, speech and thought processing grossly  intact   ASSESSMENT AND PLAN: 1. Insomnia, unspecified type - stable, well-controlled on current med and dose - PMP Aware reviewed and appropriate - at next in-person OV in 3 mo, pt will need UDS and signed controlled substance agreement Refill: - LORazepam (ATIVAN) 1 MG tablet; TAKE 1 TABLET(1 MG) BY MOUTH AT BEDTIME  Dispense: 90 tablet; Refill: 0 - f/u in 3 mo or sooner PRN    I discussed the assessment and treatment plan with the patient. The patient was provided an opportunity to ask questions and all were answered. The patient agreed with the plan and demonstrated an understanding of the instructions.   The patient was advised to call back or seek an in-person evaluation if the symptoms worsen or if the condition fails to improve as anticipated.   Letta Median, DO

## 2018-10-18 ENCOUNTER — Other Ambulatory Visit: Payer: Self-pay | Admitting: Family Medicine

## 2018-10-18 DIAGNOSIS — K219 Gastro-esophageal reflux disease without esophagitis: Secondary | ICD-10-CM

## 2018-10-18 DIAGNOSIS — G2581 Restless legs syndrome: Secondary | ICD-10-CM

## 2018-10-18 DIAGNOSIS — J069 Acute upper respiratory infection, unspecified: Secondary | ICD-10-CM

## 2018-10-27 ENCOUNTER — Other Ambulatory Visit: Payer: Self-pay | Admitting: Family Medicine

## 2018-10-27 DIAGNOSIS — J069 Acute upper respiratory infection, unspecified: Secondary | ICD-10-CM

## 2018-11-01 ENCOUNTER — Other Ambulatory Visit: Payer: Self-pay | Admitting: Family Medicine

## 2018-11-01 DIAGNOSIS — G2581 Restless legs syndrome: Secondary | ICD-10-CM

## 2018-11-23 ENCOUNTER — Other Ambulatory Visit: Payer: Self-pay | Admitting: Family Medicine

## 2018-11-23 DIAGNOSIS — K219 Gastro-esophageal reflux disease without esophagitis: Secondary | ICD-10-CM

## 2018-11-23 DIAGNOSIS — G2581 Restless legs syndrome: Secondary | ICD-10-CM

## 2018-11-24 NOTE — Telephone Encounter (Signed)
Dr.C please advise

## 2018-12-09 ENCOUNTER — Telehealth: Payer: Self-pay

## 2018-12-09 NOTE — Telephone Encounter (Signed)
Questions for Screening COVID-19  Symptom onset:n/a  Travel or Contacts: no  During this illness, did/does the patient experience any of the following symptoms? Fever >100.79F []   Yes [x]   No []   Unknown Subjective fever (felt feverish) []   Yes [x]   No []   Unknown Chills []   Yes [x]   No []   Unknown Muscle aches (myalgia) []   Yes [x]   No []   Unknown Runny nose (rhinorrhea) []   Yes [x]   No []   Unknown Sore throat []   Yes []   No []   Unknown Cough (new onset or worsening of chronic cough) []   Yes [x]   No []   Unknown Shortness of breath (dyspnea) []   Yes [x]   No []   Unknown Nausea or vomiting []   Yes [x]   No []   Unknown Headache []   Yes [x]   No []   Unknown Abdominal pain  []   Yes [x]   No []   Unknown Diarrhea (?3 loose/looser than normal stools/24hr period) []   Yes [x]   No []   Unknown Other, specify:  Patient risk factors: Smoker? []   Current []   Former []   Never If female, currently pregnant? []   Yes []   No  Patient Active Problem List   Diagnosis Date Noted  . Ingrown toenail 12/28/2017  . Iron deficiency anemia due to chronic blood loss 06/23/2017  . B12 deficiency 03/04/2016  . Essential hypertension, benign 07/04/2014  . Pulmonary embolism ([place]) 10/14/2013  . PE (pulmonary embolism) 10/14/2013  . Urinary incontinence 04/07/2013  . Right gluteus tear 12/28/2012  . Generalized anxiety disorder 08/19/2012  . GERD (gastroesophageal reflux disease) 08/19/2012  . Adult BMI 32.0-32.9 kg/sq m 08/11/2012  . Depression 09/10/2011  . S/P Nissen fundoplication (without gastrostomy tube) procedure 07/26/2011  . Chronic cough 01/09/2011  . RESTLESS LEG SYNDROME 05/05/2008  . Hyperlipidemia LDL goal <100 09/14/2007  . Osteoporosis 09/14/2007    Plan:  []   High risk for COVID-19 with red flags go to ED (with CP, SOB, weak/lightheaded, or fever > 101.5). Call ahead.  []   High risk for COVID-19 but stable. Inform provider and coordinate time for [facility] visit.   []   No red flags but URI  signs or symptoms okay for [facility] visit.

## 2018-12-10 ENCOUNTER — Encounter: Payer: Self-pay | Admitting: Family Medicine

## 2018-12-10 ENCOUNTER — Ambulatory Visit (INDEPENDENT_AMBULATORY_CARE_PROVIDER_SITE_OTHER): Payer: Medicare Other | Admitting: Family Medicine

## 2018-12-10 ENCOUNTER — Other Ambulatory Visit: Payer: Self-pay

## 2018-12-10 ENCOUNTER — Ambulatory Visit (INDEPENDENT_AMBULATORY_CARE_PROVIDER_SITE_OTHER): Payer: Medicare Other

## 2018-12-10 VITALS — BP 140/80 | HR 85 | Temp 98.0°F | Ht 66.0 in | Wt 206.8 lb

## 2018-12-10 DIAGNOSIS — R194 Change in bowel habit: Secondary | ICD-10-CM

## 2018-12-10 DIAGNOSIS — K219 Gastro-esophageal reflux disease without esophagitis: Secondary | ICD-10-CM | POA: Diagnosis not present

## 2018-12-10 DIAGNOSIS — K5900 Constipation, unspecified: Secondary | ICD-10-CM | POA: Diagnosis not present

## 2018-12-10 MED ORDER — DOCUSATE SODIUM 100 MG PO CAPS: 100.0000 mg | ORAL_CAPSULE | Freq: Two times a day (BID) | ORAL | 0 refills | Status: DC

## 2018-12-10 NOTE — Patient Instructions (Signed)
Take miralax 1 capful twice per day in 6oz of water Take colace 100mg  1 cap twice per day If no BM in 2-3 days, ok to take laxative x 2 on day 3 If no BM on day 5, call office  GI referral placed

## 2018-12-10 NOTE — Progress Notes (Signed)
Felicia Cruz is a 78 y.o. female  Chief Complaint  Patient presents with  . Pain    Stomach pain with anything that she eats/ if pt gets urge to do BM has to go right away or pt doesn't make it/ pt also states sometimes pt doesn't have BM 2-3 weeks/ 2 mo    HPI: ALIANA KREISCHER is a 78 y.o. female who complains of 2 mo h/o alternating constipation (1 BM every 2-3 wks) and multiple BMs per day. Stools are always softer "like pasta" than her normal stools and typically smaller in size. She believes her last sizable BM was 1 mo ago. She states at times she has fecal incontinence as the urge to have a BM "comes out of nowhere and is immediate".  She has taken a laxative PRN but states it takes 3 days or so for it to work and she is only having a small BM with it.  No blood in stool. No increased flatulence.  Pt states she is also having abdominal pain and bloating after most times she eats. Type and quantity of food does not change/affect symptoms. + early satiety.  No increased belching. She has rare heartburn symptoms which are not new or worse.  She stopped taking CBD oil about 2 mo ago.  Diet has not changed. Activity level has not changed. Weight has increased slightly since last OV with me.   Pt has a h/o GERD and esophageal stricture. She is s/p nissen fundoplication in 8144.  Last colonoscopy: 06/2014 [facility] Dr. [name] - no recall d/t age  Pt denies fever, chills, cough, SOB, CP. No n/v.   Past Medical History:  Diagnosis Date  . Alcoholism ([place])    HAS NOT HAD [place]  . Arthritis   . Asthma   . B12 deficiency   . Cataract   . Chronic back pain   . DDD (degenerative disc disease), lumbar   . Esophageal stricture 2010  . GERD (gastroesophageal reflux disease)   . Hiatal hernia   . Hyperlipemia   . Hypertension   . Iron deficiency anemia due to chronic blood loss 06/23/2017  . Kidney stones   . Osteopenia   . 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 RLS (restless legs syndrome)   . 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 ABDOMINOPLASTY    . DILATION AND CURETTAGE OF UTERUS  many yrs ago   x 2  . ESOPHAGEAL MANOMETRY  02/17/2012   Procedure: ESOPHAGEAL MANOMETRY (EM);  Surgeon: Pedro Earls, MD;  [facility];  Service: Endoscopy;  Laterality: N/A;  . FACIAL COSMETIC SURGERY     eyes, nose  . JOINT REPLACEMENT     left  . LAPAROSCOPIC NISSEN FUNDOPLICATION  01/18/5630   Procedure: LAPAROSCOPIC NISSEN FUNDOPLICATION;  Surgeon: Pedro Earls, MD;  [facility];  Service: General;  Laterality: N/A;  Laparoscopic Nissen repair of hiatal hernia  with lighted bougie  . LIPOSUCTION  15 to 20 yrs ago  . [place] SURGERY  2008  . OPEN SURGICAL REPAIR OF GLUTEAL TENDON Right 12/28/2012   Procedure: RIGHT GLUTEAL TENDON REPAIR;  Surgeon: Mauri Pole, MD;  [facility];  Service: Orthopedics;  Laterality: Right;  . RIGHT INDEX FINGER SURGERY  06/19/11   AT [facility]  . SURGERY FOR KIDNEY STONES    .  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   Procedure: TOTAL KNEE ARTHROPLASTY;  Surgeon: Mauri Pole, MD;  [facility];  Service: Orthopedics;  Laterality: Left;    Social History   Socioeconomic History  . Marital status: Widowed    Spouse name: Not on file  . Number of children: 0  . Years of education: Not on file  . Highest education level: Not on file  Occupational History  . Occupation: Retired  Scientific laboratory technician  . Financial resource strain: Not on file  . Food insecurity    Worry: Not on file    Inability: Not on file  . Transportation needs    Medical: Not on file    Non-medical: Not on file  Tobacco Use  . Smoking status: Former Smoker    Packs/day: 1.00    Years: 10.00    Pack years: 10.00    Types: Cigarettes    Quit date: 06/04/1971    Years since quitting: 47.5  . Smokeless tobacco: Never Used   Substance and Sexual Activity  . Alcohol use: No    Alcohol/week: 0.0 standard drinks    Comment: RECOVERING ALCOLHOLIC  . Drug use: No  . Sexual activity: Not on file  Lifestyle  . Physical activity    Days per week: Not on file    Minutes per session: Not on file  . Stress: Not on file  Relationships  . Social Herbalist on phone: Not on file    Gets together: Not on file    Attends religious service: Not on file    Active member of club or organization: Not on file    Attends meetings of clubs or organizations: Not on file    Relationship status: Not on file  . Intimate partner violence    Fear of current or ex partner: Not on file    Emotionally abused: Not on file    Physically abused: Not on file    Forced sexual activity: Not on file  Other Topics Concern  . Not on file  Social History Narrative   Regular exercise-YES    Family History  Problem Relation Age of Onset  . Throat cancer Father        Smoker  . Heart disease Mother   . Hypertension Mother   . Hip fracture Mother   . Alzheimer's disease Mother   . Stroke Mother   . Stroke Brother 45       smoker     Immunization History  Administered Date(s) Administered  . Influenza Split 03/31/2012  . Influenza Whole 03/09/2009, 03/19/2010  . Influenza, High Dose Seasonal PF 04/07/2013, 03/16/2014, 02/08/2015, 03/04/2016, 03/18/2018  . Pneumococcal Conjugate-13 09/09/2013  . Pneumococcal Polysaccharide-23 03/10/2008  . Td 06/09/2006  . Tdap 03/18/2018  . Zoster 06/26/2006  . Zoster Recombinat (Shingrix) 09/01/2017    Outpatient Encounter Medications as of 12/10/2018  Medication Sig Note  . albuterol (VENTOLIN HFA) 108 (90 BASE) MCG/ACT inhaler Inhale 2 puffs into the lungs every 4 (four) hours as needed for wheezing or shortness of breath (or cough).   . calcium-vitamin D (CALCIUM 500/D) 500-200 MG-UNIT tablet Take by mouth. 01/05/2016: Received from: [facility]: Take 1 tablet by mouth 2 times daily with meals.  . cyanocobalamin (,VITAMIN B-12,) 1000 MCG/ML injection INJECT 1 ML INTO THE MUSCLE ONCE A MONTH 06/13/2016: Received from: External Pharmacy  . cyanocobalamin (,VITAMIN B-12,) 1000 MCG/ML injection INJECT 1 ML IN THE  MUSCLE EVERY MONTH   . ELIQUIS 5 MG TABS tablet TAKE 1 TABLET(5 MG) BY MOUTH TWICE DAILY   . fluticasone (FLONASE) 50 MCG/ACT nasal spray Place 2 sprays into both nostrils daily.   . hydrochlorothiazide (HYDRODIURIL) 12.5 MG tablet Take 1 tablet (12.5 mg total) by mouth daily.   Marland Kitchen levocetirizine (XYZAL) 5 MG tablet TAKE 1 TABLET BY MOUTH EVERY EVENING   . LORazepam (ATIVAN) 1 MG tablet TAKE 1 TABLET(1 MG) BY MOUTH AT BEDTIME   . losartan (COZAAR) 100 MG tablet Take 1 tablet (100 mg total) by mouth daily.   Marland Kitchen MYRBETRIQ 50 MG TB24 tablet TAKE 1 TABLET BY MOUTH EVERY DAY   . pantoprazole (PROTONIX) 40 MG tablet TAKE 1 TABLET(40 MG) BY MOUTH DAILY   . rOPINIRole (REQUIP) 1 MG tablet TAKE 1 TABLET BY MOUTH EVERY NIGHT AT BEDTIME   . SYRINGE-NEEDLE, DISP, 3 ML (BD INTEGRA SYRINGE) 25G X 1" 3 ML MISC INJECT 1 ML MONTHLY OF B12   . traMADol (ULTRAM) 50 MG tablet 1/2 to 1 tab po BID PRN   . CANNABIDIOL PO Take 300 mg by mouth 2 (two) times daily. cbd oil   . citalopram (CELEXA) 20 MG tablet 1-1/2 tablets at bedtime (Patient not taking: Reported on 12/10/2018)    No facility-administered encounter medications on file as of 12/10/2018.      ROS: Pertinent positives and negatives noted in HPI. Remainder of ROS non-contributory    Allergies  Allergen Reactions  . Atorvastatin Other (See Comments)    Memory loss, confusion, hallucinations Amnesia  . Gabapentin Other (See Comments)    Chest pain  . Lyrica [Pregabalin] Other (See Comments)    Water blisters on face  . Penicillins Hives, Shortness Of Breath, Itching and Anaphylaxis  . Acyclovir And Related   . Famciclovir Other (See Comments)  . Latex Other (See Comments) and Rash     Skin peeling (thumb)  . Methocarbamol Other (See Comments)    Blisters on face    BP 140/80   Pulse 85   Temp 98 F (36.7 C) (Oral)   Ht 5\' 6"  (1.676 m)   Wt 206 lb 12.8 oz (93.8 kg)   SpO2 95%   BMI 33.38 kg/m   Wt Readings from Last 3 Encounters:  12/10/18 206 lb 12.8 oz (93.8 kg)  03/18/18 202 lb (91.6 kg)  09/22/17 198 lb (89.8 kg)     Physical Exam  Constitutional: She is oriented to person, place, and time. She appears well-developed and well-nourished. No distress.  Cardiovascular: Normal rate and regular rhythm.  Pulmonary/Chest: Effort normal and breath sounds normal.  Abdominal: Soft. Bowel sounds are normal. She exhibits distension (mild distention ). There is abdominal tenderness in the epigastric area. No hernia.  Neurological: She is alert and oriented to person, place, and time.     A/P:  1. Change in bowel habit - symptoms x 65mo, alternating constipation and multiple BM per day as well as change in stool caliber and consistency - well get abd flat plate to r/o significant constipation/impaction - pt will try miralax BID, colace BID x 2-3 days then can take 1-2 tab laxative if needed/no significant BM by that time Rx: - docusate sodium (COLACE) 100 MG capsule; Take 1 capsule (100 mg total) by mouth 2 (two) times daily.  Dispense: 60 capsule; Refill: 0 - pt has miralax and dulcolax at home - Ambulatory referral to Gastroenterology - DG Abd 2 Views  2. Gastroesophageal reflux disease, esophagitis  presence not specified - stable, controlled on current med - Ambulatory referral to Gastroenterology

## 2018-12-28 ENCOUNTER — Other Ambulatory Visit: Payer: Self-pay | Admitting: Family Medicine

## 2018-12-28 DIAGNOSIS — G4700 Insomnia, unspecified: Secondary | ICD-10-CM

## 2018-12-29 NOTE — Telephone Encounter (Signed)
Dr. Loletha Grayer please advise ativan last sent 09/30/2018 #90 no refills. Last OV for this was 4/29 no upcoming appt scheduled

## 2018-12-30 ENCOUNTER — Encounter: Payer: Self-pay | Admitting: Gastroenterology

## 2018-12-30 ENCOUNTER — Telehealth (INDEPENDENT_AMBULATORY_CARE_PROVIDER_SITE_OTHER): Payer: Medicare Other | Admitting: Gastroenterology

## 2018-12-30 ENCOUNTER — Other Ambulatory Visit: Payer: Self-pay

## 2018-12-30 VITALS — Ht 66.0 in | Wt 206.0 lb

## 2018-12-30 DIAGNOSIS — R159 Full incontinence of feces: Secondary | ICD-10-CM

## 2018-12-30 DIAGNOSIS — K219 Gastro-esophageal reflux disease without esophagitis: Secondary | ICD-10-CM | POA: Diagnosis not present

## 2018-12-30 DIAGNOSIS — R1310 Dysphagia, unspecified: Secondary | ICD-10-CM | POA: Diagnosis not present

## 2018-12-30 DIAGNOSIS — R195 Other fecal abnormalities: Secondary | ICD-10-CM

## 2018-12-30 DIAGNOSIS — R6881 Early satiety: Secondary | ICD-10-CM | POA: Diagnosis not present

## 2018-12-30 MED ORDER — NA SULFATE-K SULFATE-MG SULF 17.5-3.13-1.6 GM/177ML PO SOLN: 1.0000 | Freq: Once | ORAL | 0 refills | Status: AC

## 2018-12-30 NOTE — Patient Instructions (Signed)
If you are age 78 or older, your body mass index should be between 23-30. Your Body mass index is 33.25 kg/m. If this is out of the aforementioned range listed, please consider follow up with your Primary Care Provider.  If you are age 63 or younger, your body mass index should be between 19-25. Your Body mass index is 33.25 kg/m. If this is out of the aformentioned range listed, please consider follow up with your Primary Care Provider.   To help prevent the possible spread of infection to our patients, communities, and staff; we will be implementing the following measures:  As of now we are not allowing any visitors/family members to accompany you to any upcoming appointments with [facility]. If you have any concerns about this please contact our office to discuss prior to the appointment.   We have sent the following medications to your pharmacy for you to pick up at your convenience: Suprep  It has been recommended to you by your physician that you have a(n) EGD completed. Per your request, we did not schedule the procedure(s) today. Please contact our office at [phone] should you decide to have the procedure completed.   Your provider has requested that you go to the basement level for lab work at our [place] location ([address] [place] [address]) . Press "B" on the elevator. The lab is located at the first door on the left as you exit the elevator. You may go at whatever time is convienent for you. The current hours of operations are Monday- Friday 7:30am-4:30pm.  It was a pleasure to see you today!  Vito Cirigliano, D.O.

## 2018-12-30 NOTE — Progress Notes (Signed)
Chief Complaint: Change in bowel habits, GERD, early satiety, fecal incontinence  Referring Provider:     Ronnald Nian, DO  HPI:    Due to current restrictions/limitations of in-office visits due to the COVID-19 pandemic, this scheduled clinical appointment was converted to a telehealth virtual consultation using Doxim[employer].  -Time: 23 minutes -The patient did consent to this virtual visit and is aware of possible charges through their insurance for this visit.  -Names of all parties present: Felicia Cruz (patient), Gerrit Heck, DO, [facility] (physician) -Patient location: Home -Physician location: Office  Felicia Cruz is a 78 y.o. female referred to the Gastroenterology Clinic for evaluation of change in bowel habits.  She endorses a 33-month history of alternating diarrhea/constipation.  Can have 1 BM every 2 to 3 weeks, alternating with multiple small, soft, thin stools/day.  Stools described as soft and smaller in size than baseline.  Also with intermittent fecal incontinence.  Does endorse abdominal pain and bloating, typically postprandial.  Additionally with early satiety, but no associated weight loss, night sweats, fever, chills. She was seen by her PCM, Dr. Letta Median, for this earlier this month. Was started on Miralax BID, Colace BID, with improvement, but not back to baseline (baseline 1 formed stool without straining Q1-2 days).   Patient is otherwise without preceding exposures to include recent antibiotics, hospitalization, sick contacts, travel and denies new medications, supplements, OTCs.  Also with early satiety, described as eating 1/2 cup soup or half of a hamburger. No n/v.   Has a history of GERD and HH c/b esophageal stricture noted on EGD in 2010 (dilated with 16 mm Savary).  Index reflux symptoms characterized as heartburn and regurgitation.  S/p Nissen Fundoplication in 9702, but still with reflux sxs. Taking Protonix 40 mg/day and frequent  use of Tums (twice/week qhs). Intermittent dysphagia still occurs.   Endoscopic History: - Colonoscopy (06/2014, Dr. Olevia Perches): Normal -Esophageal Manometry (02/2012): Normal -BRAVO pH test (2012): JD score 1 on day 1 (normal), 21.2 on day 2 (elevated).  SAP positive for cough only -EGD (06/2008, Dr. Olevia Perches): Distal esophageal stricture dilated with a 16 mm Savary dilator with mucosal rent, 3 cm HH -Colonoscopy (04/2004, Dr. Olevia Perches): Normal.  Repeat in 10 years  Normal B12 and iron panel in 03/2018.  Normal CBC and CMP in 09/2016.  Past medical history, past surgical history, social history, family history, medications, and allergies reviewed in the chart and with patient.    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    RLS (restless legs syndrome)    Substance abuse ([place])    RECOVERING ALCOLHOLIC     Past Surgical History:  Procedure Laterality Date   ABDOMINOPLASTY     DILATION AND CURETTAGE OF UTERUS  many yrs ago   x 2   ESOPHAGEAL MANOMETRY  02/17/2012   Procedure: ESOPHAGEAL MANOMETRY (  EM);  Surgeon: Pedro Earls, MD;  Location: [facility];  Service: Endoscopy;  Laterality: N/A;   FACIAL COSMETIC SURGERY     eyes, nose   JOINT REPLACEMENT     left   LAPAROSCOPIC NISSEN FUNDOPLICATION  0/53/9767   Procedure: LAPAROSCOPIC NISSEN FUNDOPLICATION;  Surgeon: Pedro Earls, MD;  Location: [facility];  Service: General;  Laterality: N/A;  Laparoscopic Nissen repair of hiatal hernia  with lighted bougie   LIPOSUCTION  15 to 20  yrs ago   [place]  2008   OPEN SURGICAL REPAIR OF GLUTEAL TENDON Right 12/28/2012   Procedure: RIGHT GLUTEAL TENDON REPAIR;  Surgeon: Mauri Pole, MD;  Location: [facility];  Service: Orthopedics;  Laterality: Right;   RIGHT INDEX FINGER SURGERY  06/19/11   AT [facility]   SURGERY FOR KIDNEY STONES     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   Procedure: TOTAL KNEE ARTHROPLASTY;  Surgeon: Mauri Pole, MD;  Location: [facility];  Service: Orthopedics;  Laterality: Left;   Family History  Problem Relation Age of Onset   Throat cancer Father        Smoker   Heart disease Mother    Hypertension Mother    Hip fracture Mother    Alzheimer's disease Mother    Stroke Mother    Stroke Brother 52       smoker   Colon cancer Neg Hx    Social History   Tobacco Use   Smoking status: Former Smoker    Packs/day: 1.00    Years: 10.00    Pack years: 10.00    Types: Cigarettes    Quit date: 06/04/1971    Years since quitting: 47.6   Smokeless tobacco: Never Used  Substance Use Topics   Alcohol use: No    Alcohol/week: 0.0 standard drinks    Comment: RECOVERING ALCOLHOLIC   Drug use: No   Current Outpatient Medications  Medication Sig Dispense Refill   albuterol (VENTOLIN HFA) 108 (90 BASE) MCG/ACT inhaler Inhale 2 puffs into the lungs every 4 (four) hours as needed for wheezing or shortness of breath (or cough). 1 Inhaler 0   calcium-vitamin D (CALCIUM 500/D) 500-200 MG-UNIT tablet Take by mouth.     CANNABIDIOL PO Take 300 mg by mouth 2 (two) times daily. cbd oil     citalopram (CELEXA) 20 MG tablet 1-1/2 tablets at bedtime 150 tablet 3   cyanocobalamin (,VITAMIN B-12,) 1000 MCG/ML injection INJECT 1 ML INTO THE MUSCLE ONCE A MONTH  0   cyanocobalamin (,VITAMIN B-12,) 1000 MCG/ML injection INJECT 1 ML IN THE MUSCLE EVERY MONTH 30 mL 0   docusate sodium (COLACE) 100 MG capsule Take 1 capsule (100 mg total) by mouth 2 (two) times daily. 60  capsule 0   ELIQUIS 5 MG TABS tablet TAKE 1 TABLET(5 MG) BY MOUTH TWICE DAILY 60 tablet 12   fluticasone (FLONASE) 50 MCG/ACT nasal spray Place 2 sprays into both nostrils daily. 16 g 6   hydrochlorothiazide (HYDRODIURIL) 12.5 MG tablet Take 1 tablet (12.5 mg total) by mouth daily. 90 tablet 3   levocetirizine (XYZAL) 5 MG tablet TAKE 1 TABLET BY MOUTH EVERY EVENING 90 tablet 0   LORazepam (ATIVAN) 1 MG tablet TAKE 1 TABLET(1 MG) BY MOUTH AT BEDTIME 90 tablet 0   losartan (COZAAR) 100 MG tablet Take 1 tablet (100 mg total) by mouth daily. 90 tablet 3   MYRBETRIQ 50 MG TB24 tablet TAKE 1 TABLET BY MOUTH  EVERY DAY 90 tablet 0   pantoprazole (PROTONIX) 40 MG tablet TAKE 1 TABLET(40 MG) BY MOUTH DAILY 90 tablet 0   rOPINIRole (REQUIP) 1 MG tablet TAKE 1 TABLET BY MOUTH EVERY NIGHT AT BEDTIME 90 tablet 0   SYRINGE-NEEDLE, DISP, 3 ML (BD INTEGRA SYRINGE) 25G X 1" 3 ML MISC INJECT 1 ML MONTHLY OF B12 30 each 0   traMADol (ULTRAM) 50 MG tablet 1/2 to 1 tab po BID PRN 60 tablet 0   No current facility-administered medications for this visit.    Allergies  Allergen Reactions   Atorvastatin Other (See Comments)    Memory loss, confusion, hallucinations Amnesia   Gabapentin Other (See Comments)    Chest pain   Lyrica [Pregabalin] Other (See Comments)    Water blisters on face   Penicillins Hives, Shortness Of Breath, Itching and Anaphylaxis   Acyclovir And Related    Famciclovir Other (See Comments)   Latex Other (See Comments) and Rash    Skin peeling (thumb)   Methocarbamol Other (See Comments)    Blisters on face     Review of Systems: All systems reviewed and negative except where noted in HPI.     Physical Exam:    Complete physical exam not completed due to the nature of this telehealth communication.   Gen: Awake, alert, and oriented, and well communicative. HEENT: EOMI, non-icteric sclera, NCAT, MMM Neck: Normal movement of head and neck Pulm: No labored  breathing, speaking in full sentences without conversational dyspnea Derm: No apparent lesions or bruising in visible field MS: Moves all visible extremities without noticeable abnormality Psych: Pleasant, cooperative, normal speech, thought processing seemingly intact   ASSESSMENT AND PLAN;   1) GERD 2) History of hiatal hernia 3) History of Nissen fundoplication 4) Dysphagia 5) Early satiety  - Resume Protonix as prescribed for now -Resume antireflux lifestyle measures -EGD with esophageal dilation -Assess for wrap integrity, LES laxity, hernia recurrence at time of EGD -Gastric and duodenal biopsies  6) Change in bowel habits 7) Fecal Incontinence  - Check TSH and BMP -Offered colonoscopy for diagnostic and potentially therapeutic intent.  She would like to hold off on this for now - Plan for bowel prep to clear: For suspected constipation with overflow -Resume MiraLAX and Colace as currently prescribed with plan to titrate to effect, and can hopefully wean off as her baseline is 1 formed, soft stool without need for laxative/stool softeners previous to this - If no appreciable clinical improvement, plan for colonoscopy and/or ARM and/or Sitz marker study - Maintain adequate hydration as already doing  The indications, risks, and benefits of EGD were explained to the patient in detail. Risks include but are not limited to bleeding, perforation, adverse reaction to medications, and cardiopulmonary compromise. Sequelae include but are not limited to the possibility of surgery, hositalization, and mortality. The patient verbalized understanding and wished to proceed. All questions answered, referred to scheduler. Further recommendations pending results of the exam.     Lavena Bullion, DO, FACG  12/30/2018, 10:47 AM   Ronnald Nian, DO

## 2019-01-14 ENCOUNTER — Other Ambulatory Visit (INDEPENDENT_AMBULATORY_CARE_PROVIDER_SITE_OTHER): Payer: Medicare Other

## 2019-01-14 DIAGNOSIS — R159 Full incontinence of feces: Secondary | ICD-10-CM

## 2019-01-14 DIAGNOSIS — K219 Gastro-esophageal reflux disease without esophagitis: Secondary | ICD-10-CM | POA: Diagnosis not present

## 2019-01-14 DIAGNOSIS — R195 Other fecal abnormalities: Secondary | ICD-10-CM | POA: Diagnosis not present

## 2019-01-14 DIAGNOSIS — R1310 Dysphagia, unspecified: Secondary | ICD-10-CM

## 2019-01-14 DIAGNOSIS — R6881 Early satiety: Secondary | ICD-10-CM | POA: Diagnosis not present

## 2019-01-14 LAB — BASIC METABOLIC PANEL
BUN: 21 mg/dL (ref 6–23)
CO2: 27 mEq/L (ref 19–32)
Calcium: 9.5 mg/dL (ref 8.4–10.5)
Chloride: 101 mEq/L (ref 96–112)
Creatinine, Ser: 1.11 mg/dL (ref 0.40–1.20)
GFR: 47.55 mL/min — ABNORMAL LOW (ref >60.00)
Glucose, Bld: 101 mg/dL — ABNORMAL HIGH (ref 70–99)
Potassium: 4.1 mEq/L (ref 3.5–5.1)
Sodium: 138 mEq/L (ref 135–145)

## 2019-01-14 LAB — TSH: TSH: 1.16 u[IU]/mL (ref 0.35–4.50)

## 2019-01-15 ENCOUNTER — Telehealth: Payer: Self-pay

## 2019-01-15 NOTE — Telephone Encounter (Signed)
Called patient and informed her that she will not take her eliquis on 8/16 or 8/17. She can restart her eliquis on evening of 8/18. Patient said she understood instructions.

## 2019-01-15 NOTE — Telephone Encounter (Signed)
See phone note 01-15-2019. Patient informed.

## 2019-01-15 NOTE — Telephone Encounter (Signed)
Received fax from Dorris at [facility] requesting instructions for pt's Eliquis related to upcoming endoscopy.   Per Verbal Order Verified by Dr Marin Olp; pt to [facility] for TWO (2) DAYS prior (8/16 & 8/17) to endo. Pt can restart Eliquis the evening of the procedure on 8/18.   This information faxed & routed to [facility]. dph

## 2019-01-18 ENCOUNTER — Telehealth: Payer: Self-pay | Admitting: Gastroenterology

## 2019-01-18 NOTE — Telephone Encounter (Signed)

## 2019-01-18 NOTE — Telephone Encounter (Signed)
Pt returned call and answered "NO" to all of the Covid-19 questions

## 2019-01-19 ENCOUNTER — Ambulatory Visit (AMBULATORY_SURGERY_CENTER): Payer: Medicare Other | Admitting: Gastroenterology

## 2019-01-19 ENCOUNTER — Other Ambulatory Visit: Payer: Self-pay | Admitting: Family Medicine

## 2019-01-19 ENCOUNTER — Other Ambulatory Visit: Payer: Self-pay

## 2019-01-19 ENCOUNTER — Encounter: Payer: Self-pay | Admitting: Gastroenterology

## 2019-01-19 VITALS — BP 117/51 | HR 75 | Temp 97.8°F | Resp 21 | Ht 66.0 in | Wt 206.0 lb

## 2019-01-19 DIAGNOSIS — K449 Diaphragmatic hernia without obstruction or gangrene: Secondary | ICD-10-CM

## 2019-01-19 DIAGNOSIS — K648 Other hemorrhoids: Secondary | ICD-10-CM

## 2019-01-19 DIAGNOSIS — R194 Change in bowel habit: Secondary | ICD-10-CM | POA: Diagnosis not present

## 2019-01-19 DIAGNOSIS — R1310 Dysphagia, unspecified: Secondary | ICD-10-CM

## 2019-01-19 DIAGNOSIS — K219 Gastro-esophageal reflux disease without esophagitis: Secondary | ICD-10-CM | POA: Diagnosis not present

## 2019-01-19 DIAGNOSIS — D122 Benign neoplasm of ascending colon: Secondary | ICD-10-CM | POA: Diagnosis not present

## 2019-01-19 DIAGNOSIS — J069 Acute upper respiratory infection, unspecified: Secondary | ICD-10-CM

## 2019-01-19 DIAGNOSIS — K2970 Gastritis, unspecified, without bleeding: Secondary | ICD-10-CM

## 2019-01-19 DIAGNOSIS — D125 Benign neoplasm of sigmoid colon: Secondary | ICD-10-CM | POA: Diagnosis not present

## 2019-01-19 DIAGNOSIS — R32 Unspecified urinary incontinence: Secondary | ICD-10-CM

## 2019-01-19 DIAGNOSIS — Z9889 Other specified postprocedural states: Secondary | ICD-10-CM

## 2019-01-19 DIAGNOSIS — J45909 Unspecified asthma, uncomplicated: Secondary | ICD-10-CM | POA: Diagnosis not present

## 2019-01-19 DIAGNOSIS — K640 First degree hemorrhoids: Secondary | ICD-10-CM | POA: Diagnosis not present

## 2019-01-19 MED ORDER — SODIUM CHLORIDE 0.9 % IV SOLN: 500.0000 mL | Freq: Once | INTRAVENOUS | Status: DC

## 2019-01-19 NOTE — Progress Notes (Signed)
Report given to PACU, vss 

## 2019-01-19 NOTE — Op Note (Signed)
[place] Patient Name: Felicia Cruz Procedure Date: 01/19/2019 9:02 AM MRN: [id number] Endoscopist: Gerrit Heck , MD Age: 78 Referring MD:  Date of Birth: [birth year] Gender: Female Account #: [id number] Procedure:                Colonoscopy Indications:              Change in bowel habits, Diarrhea, Fecal incontinence Medicines:                Monitored Anesthesia Care Procedure:                Pre-Anesthesia Assessment:                           - Prior to the procedure, a History and Physical                            was performed, and patient medications and                            allergies were reviewed. The patient's tolerance of                            previous anesthesia was also reviewed. The risks                            and benefits of the procedure and the sedation                            options and risks were discussed with the patient.                            All questions were answered, and informed consent                            was obtained. Prior Anticoagulants: The patient has                            taken no previous anticoagulant or antiplatelet                            agents. ASA Grade Assessment: II - A patient with                            mild systemic disease. After reviewing the risks                            and benefits, the patient was deemed in                            satisfactory condition to undergo the procedure.                           After obtaining informed consent, the colonoscope  was passed under direct vision. Throughout the                            procedure, the patient's blood pressure, pulse, and                            oxygen saturations were monitored continuously. The                            Colonoscope was introduced through the anus and                            advanced to the the terminal ileum. The colonoscopy                            was  performed without difficulty. The patient                            tolerated the procedure well. The quality of the                            bowel preparation was adequate. The terminal ileum,                            ileocecal valve, appendiceal orifice, and rectum                            were photographed. Scope In: 9:13:10 AM Scope Out: 9:26:44 AM Scope Withdrawal Time: 0 hours 11 minutes 37 seconds  Total Procedure Duration: 0 hours 13 minutes 34 seconds  Findings:                 Skin tags were found on perianal exam.                           Two sessile polyps were found in the sigmoid colon                            and ascending colon. The polyps were 3 to 4 mm in                            size. These polyps were removed with a cold snare.                            Resection and retrieval were complete. Estimated                            blood loss was minimal.                           Normal mucosa was found in the entire colon.                            Biopsies for histology were taken with  a cold                            forceps from the right colon and left colon for                            evaluation of microscopic colitis. Estimated blood                            loss was minimal.                           Non-bleeding internal hemorrhoids were found during                            retroflexion. The hemorrhoids were small.                           The terminal ileum appeared normal. Complications:            No immediate complications. Estimated Blood Loss:     Estimated blood loss was minimal. Impression:               - Perianal skin tags found on perianal exam.                           - Two 3 to 4 mm polyps in the sigmoid colon and in                            the ascending colon, removed with a cold snare.                            Resected and retrieved.                           - Normal mucosa in the entire examined colon.                             Biopsied.                           - Non-bleeding internal hemorrhoids.                           - The examined portion of the ileum was normal. Recommendation:           - Patient has a contact number available for                            emergencies. The signs and symptoms of potential                            delayed complications were discussed with the                            patient. Return to normal activities tomorrow.  Written discharge instructions were provided to the                            patient.                           - Resume previous diet today.                           - Continue present medications.                           - Await pathology results.                           - Repeat colonoscopy for surveillance based on                            pathology results.                           - Return to GI clinic at appointment to be                            scheduled.                           - Use fiber, for example Citrucel, Fibercon, Konsyl                            or Metamucil.                           - Internal hemorrhoids were noted on this study and                            may be amenable to hemorrhoid band ligation. If you                            are interested in further treatment of these                            hemorrhoids with band ligation, please contact my                            clinic to set up an appointment for evaluation and                            treatment. Gerrit Heck, MD 01/19/2019 9:37:40 AM

## 2019-01-19 NOTE — Progress Notes (Signed)
Called to room to assist during endoscopic procedure.  Patient ID and intended procedure confirmed with present staff. Received instructions for my participation in the procedure from the performing physician.  

## 2019-01-19 NOTE — Progress Notes (Signed)
Pt's states no medical or surgical changes since previsit or office visit. 

## 2019-01-19 NOTE — Patient Instructions (Signed)
YOU HAD AN ENDOSCOPIC PROCEDURE TODAY AT THE [facility]:   Refer to the procedure report that was given to you for any specific questions about what was found during the examination.  If the procedure report does not answer your questions, please call your gastroenterologist to clarify.  If you requested that your care partner not be given the details of your procedure findings, then the procedure report has been included in a sealed envelope for you to review at your convenience later.  **Handouts given on polyps and hemorrhoids**  YOU SHOULD EXPECT: Some feelings of bloating in the abdomen. Passage of more gas than usual.  Walking can help get rid of the air that was put into your GI tract during the procedure and reduce the bloating. If you had a lower endoscopy (such as a colonoscopy or flexible sigmoidoscopy) you may notice spotting of blood in your stool or on the toilet paper. If you underwent a bowel prep for your procedure, you may not have a normal bowel movement for a few days.  Please Note:  You might notice some irritation and congestion in your nose or some drainage.  This is from the oxygen used during your procedure.  There is no need for concern and it should clear up in a day or so.  SYMPTOMS TO REPORT IMMEDIATELY:   Following lower endoscopy (colonoscopy or flexible sigmoidoscopy):  Excessive amounts of blood in the stool  Significant tenderness or worsening of abdominal pains  Swelling of the abdomen that is new, acute  Fever of 100F or higher   Following upper endoscopy (EGD)  Vomiting of blood or coffee ground material  New chest pain or pain under the shoulder blades  Painful or persistently difficult swallowing  New shortness of breath  Fever of 100F or higher  Black, tarry-looking stools  For urgent or emergent issues, a gastroenterologist can be reached at any hour by calling [phone].   DIET:  We do recommend a small meal at first, but  then you may proceed to your regular diet.  Drink plenty of fluids but you should avoid alcoholic beverages for 24 hours.  ACTIVITY:  You should plan to take it easy for the rest of today and you should NOT DRIVE or use heavy machinery until tomorrow (because of the sedation medicines used during the test).    FOLLOW UP: Our staff will call the number listed on your records 48-72 hours following your procedure to check on you and address any questions or concerns that you may have regarding the information given to you following your procedure. If we do not reach you, we will leave a message.  We will attempt to reach you two times.  During this call, we will ask if you have developed any symptoms of COVID 19. If you develop any symptoms (ie: fever, flu-like symptoms, shortness of breath, cough etc.) before then, please call [phone].  If you test positive for Covid 19 in the 2 weeks post procedure, please call and report this information to us.    If any biopsies were taken you will be contacted by phone or by letter within the next 1-3 weeks.  Please call us at [phone] if you have not heard about the biopsies in 3 weeks.    SIGNATURES/CONFIDENTIALITY: You and/or your care partner have signed paperwork which will be entered into your electronic medical record.  These signatures attest to the fact that that the information above on your After   Visit Summary has been reviewed and is understood.  Full responsibility of the confidentiality of this discharge information lies with you and/or your care-partner. 

## 2019-01-19 NOTE — Op Note (Signed)
[place] Patient Name: Felicia Cruz Procedure Date: 01/19/2019 9:03 AM MRN: [id number] Endoscopist: Gerrit Heck , MD Age: 78 Referring MD:  Date of Birth: 05-09-1941 Gender: Female Account #: [id number] Procedure:                Upper GI endoscopy Indications:              Dysphagia, Heartburn, Diarrhea/Change in bowel                            habits, Early satiety, History of hiatal hernia                            repair and Nissen Fundoplication in 7893, with                            subsequent reflux requiring daily PPI therapy. Medicines:                Monitored Anesthesia Care Procedure:                Pre-Anesthesia Assessment:                           - Prior to the procedure, a History and Physical                            was performed, and patient medications and                            allergies were reviewed. The patient's tolerance of                            previous anesthesia was also reviewed. The risks                            and benefits of the procedure and the sedation                            options and risks were discussed with the patient.                            All questions were answered, and informed consent                            was obtained. Prior Anticoagulants: The patient has                            taken no previous anticoagulant or antiplatelet                            agents. ASA Grade Assessment: II - A patient with                            mild systemic disease. After reviewing the risks  and benefits, the patient was deemed in                            satisfactory condition to undergo the procedure.                           After obtaining informed consent, the endoscope was                            passed under direct vision. Throughout the                            procedure, the patient's blood pressure, pulse, and                            oxygen saturations  were monitored continuously. The                            Endoscope was introduced through the mouth, and                            advanced to the second part of duodenum. The upper                            GI endoscopy was accomplished without difficulty.                            The patient tolerated the procedure well. Scope In: Scope Out: Findings:                 The upper third of the esophagus and middle third                            of the esophagus were normal.                           A 3 cm hiatal hernia was present. LES laxity noted                            on retroflexion consistent with loose wrap and                            hernia recurrence.                           Esophagogastric landmarks were identified: the                            Z-line was found at 35 cm, the gastroesophageal                            junction was found at 35 cm and the site of hiatal  narrowing was found at 38 cm from the incisors.                           Evidence of a Nissen fundoplication was found in                            the cardia. The wrap appeared loose. This was                            easily traversed.                           Scattered mild inflammation characterized by                            erythema was found in the gastric body, at the                            incisura and in the gastric antrum. Biopsies were                            taken with a cold forceps for Helicobacter pylori                            testing. Estimated blood loss was minimal.                           The duodenal bulb, first portion of the duodenum                            and second portion of the duodenum were normal.                            Biopsies for histology were taken with a cold                            forceps for evaluation of celiac disease. Estimated                            blood loss was minimal. Complications:             No immediate complications. Estimated Blood Loss:     Estimated blood loss was minimal. Impression:               - Normal upper third of esophagus and middle third                            of esophagus.                           - 3 cm hiatal hernia.                           - Esophagogastric landmarks identified.                           -  A Nissen fundoplication was found. The wrap                            appears loose.                           - Gastritis. Biopsied.                           - Normal duodenal bulb, first portion of the                            duodenum and second portion of the duodenum.                            Biopsied. Recommendation:           - Perform a colonoscopy today.                           - Continue present medications.                           - Return to GI clinic at appointment to be                            scheduled. Will discuss ongoing management options                            of reflux, to include medications vs referral back                            to surgery to discuss hernia repair and redo wrap.                           - Await pathology results. Gerrit Heck, MD 01/19/2019 9:34:07 AM

## 2019-01-21 ENCOUNTER — Telehealth: Payer: Self-pay

## 2019-01-21 NOTE — Telephone Encounter (Signed)
  Follow up Call-  Call back number 01/19/2019  Post procedure Call Back phone  # [phone]  Permission to leave phone message Yes  Some recent data might be hidden     Patient questions:  Do you have a fever, pain , or abdominal swelling? No. Pain Score  0 *  Have you tolerated food without any problems? Yes.    Have you been able to return to your normal activities? Yes.    Do you have any questions about your discharge instructions: Diet   No. Medications  No. Follow up visit  No.  Do you have questions or concerns about your Care? No.     Actions: * If pain score is 4 or above: No action needed, pain <4.   1. Have you developed a fever since your procedure? no  2.   Have you had an respiratory symptoms (SOB or cough) since your procedure? no  3.   Have you tested positive for COVID 19 since your procedure no  4.   Have you had any family members/close contacts diagnosed with the COVID 19 since your procedure?  no   If yes to any of these questions please route to Joylene John, RN and Alphonsa Gin, Therapist, sports.

## 2019-01-26 ENCOUNTER — Encounter: Payer: Self-pay | Admitting: Gastroenterology

## 2019-02-16 ENCOUNTER — Other Ambulatory Visit: Payer: Self-pay

## 2019-02-16 ENCOUNTER — Ambulatory Visit (INDEPENDENT_AMBULATORY_CARE_PROVIDER_SITE_OTHER): Payer: Medicare Other | Admitting: Gastroenterology

## 2019-02-16 ENCOUNTER — Encounter: Payer: Self-pay | Admitting: Gastroenterology

## 2019-02-16 VITALS — BP 134/72 | HR 89 | Temp 98.0°F | Ht 65.0 in | Wt 207.2 lb

## 2019-02-16 DIAGNOSIS — K449 Diaphragmatic hernia without obstruction or gangrene: Secondary | ICD-10-CM | POA: Diagnosis not present

## 2019-02-16 DIAGNOSIS — Z86010 Personal history of colonic polyps: Secondary | ICD-10-CM | POA: Diagnosis not present

## 2019-02-16 DIAGNOSIS — K219 Gastro-esophageal reflux disease without esophagitis: Secondary | ICD-10-CM

## 2019-02-16 DIAGNOSIS — K5900 Constipation, unspecified: Secondary | ICD-10-CM | POA: Diagnosis not present

## 2019-02-16 MED ORDER — ESOMEPRAZOLE MAGNESIUM 20 MG PO CPDR: 20.0000 mg | DELAYED_RELEASE_CAPSULE | Freq: Two times a day (BID) | ORAL | 5 refills | Status: DC

## 2019-02-16 NOTE — Progress Notes (Signed)
P  Chief Complaint:    GERD, change in bowel habits  GI History: 78 year old female with a longstanding history of reflux. Has a history of GERD and HH c/b esophageal stricture noted on EGD in 2010 (dilated with 16 mm Savary).  Index reflux symptoms characterized as heartburn and regurgitation.  S/p Nissen Fundoplication in 0000000, but still with reflux sxs. Taking Protonix 40 mg/day and frequent use of Tums (twice/week qhs). Intermittent dysphagia still occurs.   Repeat EGD in 01/2019 notable for loose Nissen with 3 cm HH.  Endoscopic History: -EGD (01/2019, Dr. Bryan Lemma): 3 cm HH, LES laxity with loose Nissen wrap, mild gastritis -Colonoscopy (01/2019, Dr. Bryan Lemma): 2 small tubular adenomas, otherwise normal (biopsies negative for [facility]), normal TI.  Repeat in 7 years - Colonoscopy (06/2014, Dr. Olevia Perches): Normal -Esophageal Manometry (02/2012): Normal -BRAVO pH test (2012): JD score 1 on day 1 (normal), 21.2 on day 2 (elevated).  SAP positive for cough only -EGD (06/2008, Dr. Olevia Perches): Distal esophageal stricture dilated with a 16 mm Savary dilator with mucosal rent, 3 cm HH -Colonoscopy (04/2004, Dr. Olevia Perches): Normal.  Repeat in 10 years  HPI:     Patient is a 78 y.o. female presenting to the Gastroenterology Clinic for follow-up.  Initially seen in 12/2018 with change in bowel habits.  Treated with bowel prep for suspected constipation with overflow diarrhea, then resumed MiraLAX and Colace, but has since weaned off all these meds.  Today, she states she has had significant clinical improvement. No longer with constipation or straining to to have BM. Occasional small stool, but no other sxs.  TSH and BMP were otherwise normal.    Colonoscopy with 2 small tubular adenomas, otherwise normal.  Biopsies negative for MC.  Reflux sxs relatively well controlled with Nexium BID.  EGD last month with 3 cm HH and loose Nissen wrap as above.  She does not want to pursue repeat HH repair and Nissen redo.    Review of systems:     No chest pain, no SOB, no fevers, no urinary sx   Past Medical History:  Diagnosis Date  . Alcoholism ([place])    HAS NOT HAD [place]  . Arthritis   . Asthma   . B12 deficiency   . Cataract   . Chronic back pain   . DDD (degenerative disc disease), lumbar   . Esophageal stricture 2010  . GERD (gastroesophageal reflux disease)   . Hiatal hernia   . Hyperlipemia   . Hypertension   . Iron deficiency anemia due to chronic blood loss 06/23/2017  . Kidney stones   . Osteopenia   . 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 RLS (restless legs syndrome)   . Substance abuse ([place])    RECOVERING ALCOLHOLIC    Patient's surgical history, family medical history, social history, medications and allergies were all reviewed in Epic    Current Outpatient Medications  Medication Sig Dispense Refill  . albuterol (VENTOLIN HFA) 108 (90 BASE) MCG/ACT inhaler Inhale 2 puffs into the lungs every 4 (four) hours as needed for wheezing or shortness of breath (or cough). 1 Inhaler 0  . calcium-vitamin D (CALCIUM 500/D) 500-200 MG-UNIT tablet Take by mouth.    Marland Kitchen CANNABIDIOL PO Take 300 mg by mouth 2 (two) times daily. cbd oil    . citalopram (CELEXA) 20 MG tablet 1-1/2 tablets at bedtime 150 tablet 3  . cyanocobalamin (,VITAMIN B-12,) 1000 MCG/ML injection INJECT 1 ML INTO THE  MUSCLE ONCE A MONTH  0  . cyanocobalamin (,VITAMIN B-12,) 1000 MCG/ML injection INJECT 1 ML IN THE MUSCLE EVERY MONTH 30 mL 0  . docusate sodium (COLACE) 100 MG capsule Take 1 capsule (100 mg total) by mouth 2 (two) times daily. 60 capsule 0  . ELIQUIS 5 MG TABS tablet TAKE 1 TABLET(5 MG) BY MOUTH TWICE DAILY 60 tablet 12  . fluticasone (FLONASE) 50 MCG/ACT nasal spray Place 2 sprays into both nostrils daily. 16 g 6  . hydrochlorothiazide (HYDRODIURIL) 12.5 MG tablet Take 1 tablet (12.5 mg total) by mouth daily. 90 tablet 3  . levocetirizine (XYZAL) 5 MG tablet TAKE 1 TABLET BY MOUTH  EVERY EVENING 90 tablet 0  . LORazepam (ATIVAN) 1 MG tablet TAKE 1 TABLET(1 MG) BY MOUTH AT BEDTIME 90 tablet 0  . losartan (COZAAR) 100 MG tablet Take 1 tablet (100 mg total) by mouth daily. 90 tablet 3  . MYRBETRIQ 50 MG TB24 tablet TAKE 1 TABLET BY MOUTH EVERY DAY 90 tablet 0  . pantoprazole (PROTONIX) 40 MG tablet TAKE 1 TABLET(40 MG) BY MOUTH DAILY 90 tablet 0  . rOPINIRole (REQUIP) 1 MG tablet TAKE 1 TABLET BY MOUTH EVERY NIGHT AT BEDTIME 90 tablet 0  . SYRINGE-NEEDLE, DISP, 3 ML (BD INTEGRA SYRINGE) 25G X 1" 3 ML MISC USE TO INJECT 1 ML MONTHLY OF B12 30 each 0  . traMADol (ULTRAM) 50 MG tablet 1/2 to 1 tab po BID PRN 60 tablet 0   No current facility-administered medications for this visit.     Physical Exam:     BP 134/72   Pulse 89   Temp 98 F (36.7 C)   Ht 5\' 5"  (1.651 m)   Wt 207 lb 4 oz (94 kg)   BMI 34.49 kg/m   GENERAL:  Pleasant female in NAD PSYCH: : Cooperative, normal affect EENT:  conjunctiva pink, mucous membranes moist, neck supple without masses CARDIAC:  RRR, no murmur heard, no peripheral edema PULM: Normal respiratory effort, lungs CTA bilaterally, no wheezing ABDOMEN:  Nondistended, soft, nontender. No obvious masses, no hepatomegaly,  normal bowel sounds SKIN:  turgor, no lesions seen Musculoskeletal:  Normal muscle tone, normal strength NEURO: Alert and oriented x 3, no focal neurologic deficits   IMPRESSION and PLAN:    1) GERD 2) Hiatal hernia 3) History of Nissen fundoplication -Reflux symptoms generally well controlled with current medications - Nexium 20 mg PO BID - Does not want to pursue HH repair/Nissen redo at this time -Dysphagia and early satiety resolved  4) Constipation - Resolved -Resume high-fiber diet with fiber supplement as needed  5) History of tubular adenomas: - Colonoscopy with 2 small (subcentimeter) tubular adenomas.  Consider repeat colonoscopy in 7 years pending overall clinical status  RTC prn           Lavena Bullion ,DO, FACG 02/16/2019, 10:17 AM

## 2019-02-16 NOTE — Patient Instructions (Signed)
If you are age 78 or older, your body mass index should be between 23-30. Your Body mass index is 34.49 kg/m. If this is out of the aforementioned range listed, please consider follow up with your Primary Care Provider.  If you are age 57 or younger, your body mass index should be between 19-25. Your Body mass index is 34.49 kg/m. If this is out of the aformentioned range listed, please consider follow up with your Primary Care Provider.   To help prevent the possible spread of infection to our patients, communities, and staff; we will be implementing the following measures:  As of now we are not allowing any visitors/family members to accompany you to any upcoming appointments with [facility]. If you have any concerns about this please contact our office to discuss prior to the appointment.   We have sent the following medications to your pharmacy for you to pick up at your convenience: Nexium 20mg  twice daily.  Follow-up with our office as needed.  It was a pleasure to see you today!  Vito Cirigliano, D.O.

## 2019-02-18 ENCOUNTER — Other Ambulatory Visit: Payer: Self-pay | Admitting: Family Medicine

## 2019-02-18 DIAGNOSIS — K219 Gastro-esophageal reflux disease without esophagitis: Secondary | ICD-10-CM

## 2019-02-18 DIAGNOSIS — G2581 Restless legs syndrome: Secondary | ICD-10-CM

## 2019-03-12 ENCOUNTER — Other Ambulatory Visit: Payer: Self-pay | Admitting: Family Medicine

## 2019-03-12 DIAGNOSIS — G2581 Restless legs syndrome: Secondary | ICD-10-CM

## 2019-03-17 ENCOUNTER — Other Ambulatory Visit: Payer: Self-pay | Admitting: Family Medicine

## 2019-03-17 DIAGNOSIS — L570 Actinic keratosis: Secondary | ICD-10-CM | POA: Diagnosis not present

## 2019-03-17 DIAGNOSIS — L821 Other seborrheic keratosis: Secondary | ICD-10-CM | POA: Diagnosis not present

## 2019-03-17 DIAGNOSIS — L820 Inflamed seborrheic keratosis: Secondary | ICD-10-CM | POA: Diagnosis not present

## 2019-03-17 DIAGNOSIS — Z85828 Personal history of other malignant neoplasm of skin: Secondary | ICD-10-CM | POA: Diagnosis not present

## 2019-03-17 DIAGNOSIS — D1801 Hemangioma of skin and subcutaneous tissue: Secondary | ICD-10-CM | POA: Diagnosis not present

## 2019-03-17 DIAGNOSIS — D225 Melanocytic nevi of trunk: Secondary | ICD-10-CM | POA: Diagnosis not present

## 2019-03-17 DIAGNOSIS — Z85820 Personal history of malignant melanoma of skin: Secondary | ICD-10-CM | POA: Diagnosis not present

## 2019-03-17 DIAGNOSIS — G2581 Restless legs syndrome: Secondary | ICD-10-CM

## 2019-03-17 DIAGNOSIS — L573 Poikiloderma of Civatte: Secondary | ICD-10-CM | POA: Diagnosis not present

## 2019-03-17 MED ORDER — TRAMADOL HCL 50 MG PO TABS: ORAL_TABLET | ORAL | 0 refills | Status: DC

## 2019-03-17 NOTE — Telephone Encounter (Signed)
Pt called in to check the status of her medication refill. Pt says that she is currently at the pharmacy waiting for her Rx. Pt says that it was called in on 03/12/19.     Call was dropped, please advise pt directly.

## 2019-03-18 DIAGNOSIS — M4030 Flatback syndrome, site unspecified: Secondary | ICD-10-CM | POA: Diagnosis not present

## 2019-03-18 DIAGNOSIS — M545 Low back pain: Secondary | ICD-10-CM | POA: Diagnosis not present

## 2019-03-18 DIAGNOSIS — M4712 Other spondylosis with myelopathy, cervical region: Secondary | ICD-10-CM | POA: Diagnosis not present

## 2019-03-18 DIAGNOSIS — G8929 Other chronic pain: Secondary | ICD-10-CM | POA: Diagnosis not present

## 2019-04-02 DIAGNOSIS — M5124 Other intervertebral disc displacement, thoracic region: Secondary | ICD-10-CM | POA: Diagnosis not present

## 2019-04-02 DIAGNOSIS — M545 Low back pain: Secondary | ICD-10-CM | POA: Diagnosis not present

## 2019-04-02 DIAGNOSIS — M4712 Other spondylosis with myelopathy, cervical region: Secondary | ICD-10-CM | POA: Diagnosis not present

## 2019-04-02 DIAGNOSIS — G8929 Other chronic pain: Secondary | ICD-10-CM | POA: Diagnosis not present

## 2019-04-02 DIAGNOSIS — M542 Cervicalgia: Secondary | ICD-10-CM | POA: Diagnosis not present

## 2019-04-02 DIAGNOSIS — S199XXA Unspecified injury of neck, initial encounter: Secondary | ICD-10-CM | POA: Diagnosis not present

## 2019-04-15 ENCOUNTER — Other Ambulatory Visit: Payer: Self-pay | Admitting: Family Medicine

## 2019-04-15 DIAGNOSIS — J069 Acute upper respiratory infection, unspecified: Secondary | ICD-10-CM

## 2019-04-15 DIAGNOSIS — R32 Unspecified urinary incontinence: Secondary | ICD-10-CM

## 2019-05-14 ENCOUNTER — Other Ambulatory Visit: Payer: Self-pay | Admitting: Family Medicine

## 2019-05-17 ENCOUNTER — Other Ambulatory Visit: Payer: Self-pay | Admitting: Family Medicine

## 2019-05-17 DIAGNOSIS — G2581 Restless legs syndrome: Secondary | ICD-10-CM

## 2019-05-17 DIAGNOSIS — K219 Gastro-esophageal reflux disease without esophagitis: Secondary | ICD-10-CM

## 2019-05-19 ENCOUNTER — Other Ambulatory Visit: Payer: Self-pay | Admitting: Family Medicine

## 2019-05-19 DIAGNOSIS — K219 Gastro-esophageal reflux disease without esophagitis: Secondary | ICD-10-CM

## 2019-05-19 DIAGNOSIS — G2581 Restless legs syndrome: Secondary | ICD-10-CM

## 2019-05-19 MED ORDER — ROPINIROLE HCL 1 MG PO TABS: 1.0000 mg | ORAL_TABLET | Freq: Every day | ORAL | 3 refills | Status: DC

## 2019-05-19 MED ORDER — PANTOPRAZOLE SODIUM 40 MG PO TBEC: 40.0000 mg | DELAYED_RELEASE_TABLET | Freq: Every day | ORAL | 3 refills | Status: DC

## 2019-06-08 ENCOUNTER — Other Ambulatory Visit: Payer: Self-pay | Admitting: Hematology & Oncology

## 2019-06-29 DIAGNOSIS — Z23 Encounter for immunization: Secondary | ICD-10-CM | POA: Diagnosis not present

## 2019-07-06 ENCOUNTER — Other Ambulatory Visit: Payer: Self-pay | Admitting: Family Medicine

## 2019-07-06 NOTE — Telephone Encounter (Signed)
Last OV 12/10/18 Last fill 06/24/18  #90/3 for both meds.

## 2019-07-10 ENCOUNTER — Other Ambulatory Visit: Payer: Self-pay | Admitting: Family Medicine

## 2019-07-10 DIAGNOSIS — J069 Acute upper respiratory infection, unspecified: Secondary | ICD-10-CM

## 2019-07-10 DIAGNOSIS — R32 Unspecified urinary incontinence: Secondary | ICD-10-CM

## 2019-07-27 DIAGNOSIS — Z23 Encounter for immunization: Secondary | ICD-10-CM | POA: Diagnosis not present

## 2019-08-08 ENCOUNTER — Other Ambulatory Visit: Payer: Self-pay | Admitting: Family Medicine

## 2019-08-08 DIAGNOSIS — J069 Acute upper respiratory infection, unspecified: Secondary | ICD-10-CM

## 2019-08-08 DIAGNOSIS — G2581 Restless legs syndrome: Secondary | ICD-10-CM

## 2019-08-09 NOTE — Telephone Encounter (Signed)
Requesting: Tramadol Contract: None UDS: None Last OV: 7.9.20 Next OV: 4.7.21 with [facility] for AW[facility] Last Refill: 10.14.2020   Please advise

## 2019-09-01 ENCOUNTER — Other Ambulatory Visit: Payer: Self-pay | Admitting: Family Medicine

## 2019-09-01 DIAGNOSIS — G2581 Restless legs syndrome: Secondary | ICD-10-CM

## 2019-09-07 NOTE — Progress Notes (Signed)
Nurse connected with patient 09/08/19 at  2:00 PM EDT by a telephone enabled telemedicine application and verified that I am speaking with the correct person using two identifiers. Patient stated full name and DOB. Patient gave permission to continue with virtual visit. Patient's location was at home and Nurse's location was at [place] office.   Subjective:   Felicia Cruz is a 79 y.o. female who presents for Medicare Annual/Subsequent preventive examination.  Enjoys reading.   Review of Systems:  Home Safety/Smoke Alarms: Feels safe in home. Smoke alarms in place.  Lives at [facility] some and has a Games developer in [place]. Uses walker.   Female:       Mammo- pt states scheduled next week.    Dexa scan- 04/20/18        CCS- No longer doing routine screening due to age.     Objective:    Vitals: Unable to assess. This visit is enabled though telemedicine due to Covid 19.   Advanced Directives 09/08/2019 09/02/2018 05/08/2017 06/13/2016 01/24/2015 04/26/2014 04/08/2014  Does Patient Have a Medical Advance Directive? Yes Yes Yes Yes Yes No No;Yes  Type of Paramedic of [place];Living will [place];Living will [place];Living will [place];Living will [place]  Does patient want to make changes to medical advance directive? No - Patient declined No - Guardian declined - - No - Patient declined - -  Copy of [place] in Chart? No - copy requested No - copy requested - No - copy requested - - No - copy requested  Would patient like information on creating a medical advance directive? - - - - - No - patient declined information -  Pre-existing out of facility DNR order (yellow form or pink MOST form) - - - - - - -    Tobacco Social History   Tobacco Use  Smoking Status Former Smoker  . Packs/day: 1.00  . Years:  10.00  . Pack years: 10.00  . Types: Cigarettes  . Quit date: 06/04/1971  . Years since quitting: 48.2  Smokeless Tobacco Never Used     Counseling given: Not Answered   Clinical Intake: Pain : No/denies pain     Past Medical History:  Diagnosis Date  . Alcoholism ([place])    HAS NOT HAD [place]  . Arthritis   . Asthma   . B12 deficiency   . Cataract   . Chronic back pain   . DDD (degenerative disc disease), lumbar   . Esophageal stricture 2010  . GERD (gastroesophageal reflux disease)   . Hiatal hernia   . Hyperlipemia   . Hypertension   . Iron deficiency anemia due to chronic blood loss 06/23/2017  . Kidney stones   . Osteopenia   . 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 RLS (restless legs syndrome)   . 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 ABDOMINOPLASTY    . DILATION AND CURETTAGE OF UTERUS  many yrs ago   x 2  . ESOPHAGEAL MANOMETRY  02/17/2012   Procedure: ESOPHAGEAL MANOMETRY (EM);  Surgeon: Pedro Earls, MD;  Location: [facility];  Service: Endoscopy;  Laterality: N/A;  . FACIAL COSMETIC SURGERY     eyes, nose  . JOINT REPLACEMENT     left  . LAPAROSCOPIC  NISSEN FUNDOPLICATION  [id number]   Procedure: LAPAROSCOPIC NISSEN FUNDOPLICATION;  Surgeon: Pedro Earls, MD;  Location: [facility];  Service: General;  Laterality: N/A;  Laparoscopic Nissen repair of hiatal hernia  with lighted bougie  . LIPOSUCTION  15 to 20 yrs ago  . [place] SURGERY  2008  . OPEN SURGICAL REPAIR OF GLUTEAL TENDON Right 12/28/2012   Procedure: RIGHT GLUTEAL TENDON REPAIR;  Surgeon: Mauri Pole, MD;  Location: [facility];  Service: Orthopedics;  Laterality: Right;  . RIGHT INDEX FINGER SURGERY  06/19/11   AT [facility]  . SURGERY FOR KIDNEY STONES    . 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 Throat cancer Father        Smoker  . Heart disease Mother   . Hypertension Mother   . Hip fracture Mother   . Alzheimer's disease Mother   . Stroke Mother   . Stroke Brother 20       smoker  . Colon cancer Neg Hx    Social History   Socioeconomic History  . Marital status: Widowed    Spouse name: Not on file  . Number of children: 0  . Years of education: Not on file  . Highest education level: Not on file  Occupational History  . Occupation: Retired  Tobacco Use  . Smoking status: Former Smoker    Packs/day: 1.00    Years: 10.00    Pack years: 10.00    Types: Cigarettes    Quit date: 06/04/1971    Years since quitting: 48.2  . Smokeless tobacco: Never Used  Substance and Sexual Activity  . Alcohol use: No    Alcohol/week: 0.0 standard drinks    Comment: RECOVERING ALCOLHOLIC  . Drug use: No  . Sexual activity: Not on file  Other Topics Concern  . Not on file  Social History Narrative   Regular exercise-YES   Social Determinants of Health   Financial Resource Strain:   . Difficulty of Paying Living Expenses:   Food Insecurity:   . Worried About Charity fundraiser in the Last Year:   . Arboriculturist in the Last Year:   Transportation Needs:   . Film/video editor (Medical):   Marland Kitchen Lack of Transportation (Non-Medical):   Physical Activity:   . Days of Exercise per Week:   . Minutes of Exercise per Session:   Stress:   . Feeling of Stress :   Social Connections:   . Frequency of Communication with Friends and Family:   . Frequency of Social Gatherings with Friends and Family:   . Attends Religious Services:   . Active Member of Clubs or Organizations:   . Attends Archivist Meetings:   Marland Kitchen Marital Status:     Outpatient Encounter Medications as of 09/08/2019  Medication Sig  . calcium-vitamin D (CALCIUM 500/D) 500-200 MG-UNIT tablet Take by mouth.    . citalopram (CELEXA) 20 MG tablet 1-1/2 tablets at bedtime  . ELIQUIS 5 MG TABS tablet TAKE 1 TABLET(5 MG) BY MOUTH TWICE DAILY  . esomeprazole (NEXIUM) 20 MG capsule Take 1 capsule (20 mg total) by mouth 2 (two) times daily before a meal.  . fluticasone (FLONASE) 50 MCG/ACT nasal spray Place 2 sprays into both nostrils daily.  Marland Kitchen levocetirizine (  XYZAL) 5 MG tablet TAKE 1 TABLET BY MOUTH EVERY EVENING  . LORazepam (ATIVAN) 1 MG tablet TAKE 1 TABLET(1 MG) BY MOUTH AT BEDTIME  . losartan-hydrochlorothiazide (HYZAAR) 100-12.5 MG tablet TAKE 1 TABLET BY MOUTH EVERY MORNING  . MYRBETRIQ 50 MG TB24 tablet TAKE 1 TABLET BY MOUTH DAILY  . pantoprazole (PROTONIX) 40 MG tablet Take 1 tablet (40 mg total) by mouth daily.  Marland Kitchen rOPINIRole (REQUIP) 1 MG tablet TAKE 1 TABLET BY MOUTH EVERY NIGHT AT BEDTIME  . traMADol (ULTRAM) 50 MG tablet TAKE 1/2 TO 1 TABLET BY MOUTH TWICE DAILY AS NEEDED  . albuterol (VENTOLIN HFA) 108 (90 BASE) MCG/ACT inhaler Inhale 2 puffs into the lungs every 4 (four) hours as needed for wheezing or shortness of breath (or cough). (Patient not taking: Reported on 09/08/2019)  . CANNABIDIOL PO Take 300 mg by mouth 2 (two) times daily. cbd oil  . cyanocobalamin (,VITAMIN B-12,) 1000 MCG/ML injection INJECT 1 ML INTO THE MUSCLE ONCE A MONTH  . cyanocobalamin (,VITAMIN B-12,) 1000 MCG/ML injection INJECT 1 ML IN THE MUSCLE EVERY MONTH (Patient not taking: Reported on 09/08/2019)  . docusate sodium (COLACE) 100 MG capsule Take 1 capsule (100 mg total) by mouth 2 (two) times daily. (Patient not taking: Reported on 09/08/2019)  . hydrochlorothiazide (HYDRODIURIL) 12.5 MG tablet TAKE 1 TABLET(12.5 MG) BY MOUTH DAILY (Patient not taking: Reported on 09/08/2019)  . losartan (COZAAR) 100 MG tablet TAKE 1 TABLET(100 MG) BY MOUTH DAILY (Patient not taking: Reported on 09/08/2019)  . SYRINGE-NEEDLE, DISP, 3 ML (BD INTEGRA SYRINGE) 25G X 1" 3 ML MISC USE TO INJECT 1 ML MONTHLY OF B12 (Patient not taking: Reported  on 09/08/2019)   No facility-administered encounter medications on file as of 09/08/2019.    Activities of Daily Living In your present state of health, do you have any difficulty performing the following activities: 09/08/2019  Hearing? N  Vision? N  Difficulty concentrating or making decisions? N  Walking or climbing stairs? Y  Dressing or bathing? N  Doing errands, shopping? N  Preparing Food and eating ? N  Using the Toilet? N  In the past six months, have you accidently leaked urine? N  Do you have problems with loss of bowel control? N  Managing your Medications? N  Managing your Finances? N  Housekeeping or managing your Housekeeping? N  Some recent data might be hidden    Patient Care Team: Ronnald Nian, DO as PCP - General (Family Medicine) Griselda Miner, MD as Consulting Physician (Dermatology) Volanda Napoleon, MD as Consulting Physician (Oncology)   Assessment:   This is a routine wellness examination for [place]. Physical assessment deferred to PCP.  Exercise Activities and Dietary recommendations Current Exercise Habits: The patient does not participate in regular exercise at present, Exercise limited by: None identified   Diet (meal preparation, eat out, water intake, caffeinated beverages, dairy products, fruits and vegetables): in general, a "healthy" diet  , well balanced   Goals    . Continue to eat a healthy diet and drink plenty of water.       Fall Risk Fall Risk  09/08/2019 09/02/2018 12/31/2017 06/13/2016 06/13/2016  Falls in the past year? 1 0 No Yes Yes  Comment - - Emmi Telephone Survey: data to providers prior to load - -  Number falls in past yr: 0 - - 2 or more 2 or more  Injury with Fall? 1 - - No No  Risk for fall due to : - - -  Impaired balance/gait -  Follow up Education provided;Falls prevention discussed - - Education provided;Falls prevention discussed -     Depression Screen PHQ 2/9 Scores 09/08/2019 09/02/2018 06/13/2016 06/13/2016  PHQ  - 2 Score 0 0 0 0    Cognitive Function Ad8 score reviewed for issues:  Issues making decisions:no  Less interest in hobbies / activities:no  Repeats questions, stories (family complaining):no  Trouble using ordinary gadgets (microwave, computer, phone):no  Forgets the month or year: no  Mismanaging finances: no  Remembering appts:no  Daily problems with thinking and/or memory:no Ad8 score is=0  MMSE - Mini Mental State Exam 06/13/2016  Orientation to time 5  Orientation to Place 5  Registration 3  Attention/ Calculation 5  Recall 3  Language- name 2 objects 2  Language- repeat 1  Language- follow 3 step command 3  Language- read & follow direction 1  Write a sentence 1  Copy design 1  Total score 30        Immunization History  Administered Date(s) Administered  . Influenza Split 03/31/2012  . Influenza Whole 03/09/2009, 03/19/2010  . Influenza, High Dose Seasonal PF 04/07/2013, 03/16/2014, 02/08/2015, 03/04/2016, 03/18/2018  . Pneumococcal Conjugate-13 09/09/2013  . Pneumococcal Polysaccharide-23 03/10/2008  . Td 06/09/2006  . Tdap 03/18/2018  . Zoster 06/26/2006  . Zoster Recombinat (Shingrix) 09/01/2017    Screening Tests Health Maintenance  Topic Date Due  . MAMMOGRAM  07/09/2018  . INFLUENZA VACCINE  01/02/2020  . DEXA SCAN  04/10/2020  . COLONOSCOPY  01/18/2026  . TETANUS/TDAP  03/18/2028  . PNA vac Low Risk Adult  Completed     Plan:    Please schedule your next medicare wellness visit with me in 1 yr.  Continue to eat heart healthy diet (full of fruits, vegetables, whole grains, lean protein, water--limit salt, fat, and sugar intake) and increase physical activity as tolerated.  Continue doing brain stimulating activities (puzzles, reading, adult coloring books, staying active) to keep memory sharp.   Bring a copy of your living will and/or healthcare power of attorney to your next office visit.  I have personally reviewed and noted the  following in the patient's chart:   . Medical and social history . Use of alcohol, tobacco or illicit drugs  . Current medications and supplements . Functional ability and status . Nutritional status . Physical activity . Advanced directives . List of other physicians . Hospitalizations, surgeries, and ER visits in previous 12 months . Vitals . Screenings to include cognitive, depression, and falls . Referrals and appointments  In addition, I have reviewed and discussed with patient certain preventive protocols, quality metrics, and best practice recommendations. A written personalized care plan for preventive services as well as general preventive health recommendations were provided to patient.     Shela Nevin, [place]  09/08/2019

## 2019-09-08 ENCOUNTER — Encounter: Payer: Self-pay | Admitting: *Deleted

## 2019-09-08 ENCOUNTER — Ambulatory Visit (INDEPENDENT_AMBULATORY_CARE_PROVIDER_SITE_OTHER): Payer: Medicare Other | Admitting: *Deleted

## 2019-09-08 DIAGNOSIS — Z0000 Encounter for general adult medical examination without abnormal findings: Secondary | ICD-10-CM

## 2019-09-08 NOTE — Patient Instructions (Signed)
Please schedule your next medicare wellness visit with me in 1 yr.  Continue to eat heart healthy diet (full of fruits, vegetables, whole grains, lean protein, water--limit salt, fat, and sugar intake) and increase physical activity as tolerated.  Continue doing brain stimulating activities (puzzles, reading, adult coloring books, staying active) to keep memory sharp.   Bring a copy of your living will and/or healthcare power of attorney to your next office visit.   Felicia Cruz , Thank you for taking time to come for your Medicare Wellness Visit. I appreciate your ongoing commitment to your health goals. Please review the following plan we discussed and let me know if I can assist you in the future.   These are the goals we discussed: Goals    . Continue to eat a healthy diet and drink plenty of water.       This is a list of the screening recommended for you and due dates:  Health Maintenance  Topic Date Due  . Mammogram  07/09/2018  . Flu Shot  01/02/2020  . DEXA scan (bone density measurement)  04/10/2020  . Colon Cancer Screening  01/18/2026  . Tetanus Vaccine  03/18/2028  . Pneumonia vaccines  Completed    Preventive Care 79 Years and Older, Female Preventive care refers to lifestyle choices and visits with your health care provider that can promote health and wellness. This includes:  A yearly physical exam. This is also called an annual well check.  Regular dental and eye exams.  Immunizations.  Screening for certain conditions.  Healthy lifestyle choices, such as diet and exercise. What can I expect for my preventive care visit? Physical exam Your health care provider will check:  Height and weight. These may be used to calculate body mass index (BMI), which is a measurement that tells if you are at a healthy weight.  Heart rate and blood pressure.  Your skin for abnormal spots. Counseling Your health care provider may ask you questions about:  Alcohol,  tobacco, and drug use.  Emotional well-being.  Home and relationship well-being.  Sexual activity.  Eating habits.  History of falls.  Memory and ability to understand (cognition).  Work and work Statistician.  Pregnancy and menstrual history. What immunizations do I need?  Influenza (flu) vaccine  This is recommended every year. Tetanus, diphtheria, and pertussis (Tdap) vaccine  You may need a Td booster every 10 years. Varicella (chickenpox) vaccine  You may need this vaccine if you have not already been vaccinated. Zoster (shingles) vaccine  You may need this after age 8. Pneumococcal conjugate (PCV13) vaccine  One dose is recommended after age 53. Pneumococcal polysaccharide (PPSV23) vaccine  One dose is recommended after age 7. Measles, mumps, and rubella (MMR) vaccine  You may need at least one dose of MMR if you were born in 1957 or later. You may also need a second dose. Meningococcal conjugate (MenACWY) vaccine  You may need this if you have certain conditions. Hepatitis A vaccine  You may need this if you have certain conditions or if you travel or work in places where you may be exposed to hepatitis A. Hepatitis B vaccine  You may need this if you have certain conditions or if you travel or work in places where you may be exposed to hepatitis B. Haemophilus influenzae type b (Hib) vaccine  You may need this if you have certain conditions. You may receive vaccines as individual doses or as more than one vaccine together in one  shot (combination vaccines). Talk with your health care provider about the risks and benefits of combination vaccines. What tests do I need? Blood tests  Lipid and cholesterol levels. These may be checked every 5 years, or more frequently depending on your overall health.  Hepatitis C test.  Hepatitis B test. Screening  Lung cancer screening. You may have this screening every year starting at age 33 if you have a  30-pack-year history of smoking and currently smoke or have quit within the past 15 years.  Colorectal cancer screening. All adults should have this screening starting at age 69 and continuing until age 34. Your health care provider may recommend screening at age 88 if you are at increased risk. You will have tests every 1-10 years, depending on your results and the type of screening test.  Diabetes screening. This is done by checking your blood sugar (glucose) after you have not eaten for a while (fasting). You may have this done every 1-3 years.  Mammogram. This may be done every 1-2 years. Talk with your health care provider about how often you should have regular mammograms.  BRCA-related cancer screening. This may be done if you have a family history of breast, ovarian, tubal, or peritoneal cancers. Other tests  Sexually transmitted disease (STD) testing.  Bone density scan. This is done to screen for osteoporosis. You may have this done starting at age 32. Follow these instructions at home: Eating and drinking  Eat a diet that includes fresh fruits and vegetables, whole grains, lean protein, and low-fat dairy products. Limit your intake of foods with high amounts of sugar, saturated fats, and salt.  Take vitamin and mineral supplements as recommended by your health care provider.  Do not drink alcohol if your health care provider tells you not to drink.  If you drink alcohol: ? Limit how much you have to 0-1 drink a day. ? Be aware of how much alcohol is in your drink. In the [place] one drink equals one 12 oz bottle of beer (355 mL), one 5 oz glass of wine (148 mL), or one 1 oz glass of hard liquor (44 mL). Lifestyle  Take daily care of your teeth and gums.  Stay active. Exercise for at least 30 minutes on 5 or more days each week.  Do not use any products that contain nicotine or tobacco, such as cigarettes, e-cigarettes, and chewing tobacco. If you need help quitting, ask your  health care provider.  If you are sexually active, practice safe sex. Use a condom or other form of protection in order to prevent STIs (sexually transmitted infections).  Talk with your health care provider about taking a low-dose aspirin or statin. What's next?  Go to your health care provider once a year for a well check visit.  Ask your health care provider how often you should have your eyes and teeth checked.  Stay up to date on all vaccines. This information is not intended to replace advice given to you by your health care provider. Make sure you discuss any questions you have with your health care provider. Document Revised: 05/14/2018 Document Reviewed: 05/14/2018 Elsevier Patient Education  2020 [employer].

## 2019-09-17 DIAGNOSIS — Z1231 Encounter for screening mammogram for malignant neoplasm of breast: Secondary | ICD-10-CM | POA: Diagnosis not present

## 2019-10-08 ENCOUNTER — Other Ambulatory Visit: Payer: Self-pay | Admitting: Family Medicine

## 2019-10-08 DIAGNOSIS — R32 Unspecified urinary incontinence: Secondary | ICD-10-CM

## 2019-10-08 NOTE — Telephone Encounter (Signed)
Last OV 12/10/18 Last fill 07/12/19  #90/0

## 2019-10-12 DIAGNOSIS — M1711 Unilateral primary osteoarthritis, right knee: Secondary | ICD-10-CM | POA: Diagnosis not present

## 2019-12-10 ENCOUNTER — Other Ambulatory Visit: Payer: Self-pay | Admitting: Family Medicine

## 2019-12-10 DIAGNOSIS — J069 Acute upper respiratory infection, unspecified: Secondary | ICD-10-CM

## 2020-01-06 ENCOUNTER — Other Ambulatory Visit: Payer: Self-pay | Admitting: Family Medicine

## 2020-01-06 DIAGNOSIS — R32 Unspecified urinary incontinence: Secondary | ICD-10-CM

## 2020-01-07 NOTE — Telephone Encounter (Signed)
Last OV 09/08/19 Last fill 10/08/19  #90/0

## 2020-01-11 ENCOUNTER — Other Ambulatory Visit: Payer: Self-pay | Admitting: Family Medicine

## 2020-01-11 DIAGNOSIS — G2581 Restless legs syndrome: Secondary | ICD-10-CM

## 2020-01-12 NOTE — Telephone Encounter (Signed)
Last OV 05/19/19 Tramadol last filled 08/11/19 #60 with 0

## 2020-03-22 DIAGNOSIS — Z85828 Personal history of other malignant neoplasm of skin: Secondary | ICD-10-CM | POA: Diagnosis not present

## 2020-03-22 DIAGNOSIS — L570 Actinic keratosis: Secondary | ICD-10-CM | POA: Diagnosis not present

## 2020-03-22 DIAGNOSIS — L820 Inflamed seborrheic keratosis: Secondary | ICD-10-CM | POA: Diagnosis not present

## 2020-03-22 DIAGNOSIS — L821 Other seborrheic keratosis: Secondary | ICD-10-CM | POA: Diagnosis not present

## 2020-03-22 DIAGNOSIS — D225 Melanocytic nevi of trunk: Secondary | ICD-10-CM | POA: Diagnosis not present

## 2020-03-22 DIAGNOSIS — D692 Other nonthrombocytopenic purpura: Secondary | ICD-10-CM | POA: Diagnosis not present

## 2020-03-22 DIAGNOSIS — Z85820 Personal history of malignant melanoma of skin: Secondary | ICD-10-CM | POA: Diagnosis not present

## 2020-03-28 DIAGNOSIS — Z23 Encounter for immunization: Secondary | ICD-10-CM | POA: Diagnosis not present

## 2020-04-05 ENCOUNTER — Other Ambulatory Visit: Payer: Self-pay | Admitting: Family Medicine

## 2020-04-05 DIAGNOSIS — R32 Unspecified urinary incontinence: Secondary | ICD-10-CM

## 2020-04-05 NOTE — Telephone Encounter (Signed)
Received a refill request for: Myrbetric 50 mg LR 01/07/20, #90, 0 rf LOV 02/16/19 FOV  None scheduled.  Please review and advise.   Thanks.  Dm/cma

## 2020-05-05 ENCOUNTER — Other Ambulatory Visit: Payer: Self-pay | Admitting: Gastroenterology

## 2020-05-24 ENCOUNTER — Other Ambulatory Visit: Payer: Self-pay | Admitting: Family Medicine

## 2020-07-03 ENCOUNTER — Telehealth: Payer: Self-pay | Admitting: Family Medicine

## 2020-07-03 DIAGNOSIS — G2581 Restless legs syndrome: Secondary | ICD-10-CM

## 2020-07-03 NOTE — Telephone Encounter (Signed)
Caller Name: Anthea Call back phone #: [phone]  MEDICATION(S): Tramadol Uses prn - has 15-20 left  Next OV 09/20/2020  Has the patient contacted their pharmacy? Yes.   IF NO, request that the patient contact the pharmacy for the refills in the future & the pharmacy will send an electronic request (except for controlled medications). IF YES, when and what did the pharmacy advise? Call your MD  Preferred Pharmacy: [facility]

## 2020-07-04 ENCOUNTER — Other Ambulatory Visit: Payer: Self-pay | Admitting: Hematology & Oncology

## 2020-07-04 NOTE — Telephone Encounter (Signed)
Tramadol 50 mg   LR 01/12/20, #60, 0 rf LOV 09/08/19 FOV  09/20/20  Please advise.   Thanks. Dm/cma

## 2020-07-05 MED ORDER — TRAMADOL HCL 50 MG PO TABS: 50.0000 mg | ORAL_TABLET | Freq: Two times a day (BID) | ORAL | 0 refills | Status: DC | PRN

## 2020-07-05 NOTE — Telephone Encounter (Signed)
Patient notified VIA phone that RX was sent to the pharmacy.  No questions. Dm/cma

## 2020-07-05 NOTE — Addendum Note (Signed)
Addended by: Ronnald Nian on: 07/05/2020 12:29 PM   Modules accepted: Orders

## 2020-07-05 NOTE — Telephone Encounter (Signed)
refilled 

## 2020-07-06 ENCOUNTER — Telehealth: Payer: Self-pay

## 2020-07-06 NOTE — Telephone Encounter (Signed)
PA for Tramadol 50 mg started through cover my meds.  Awaiting response. Dm/cma  Insurance information:  BIN# [id number] PCN# [id number] Group # [id number] ID#  [id number]  Key: [id number] - PA Case ID: [id number]

## 2020-07-07 ENCOUNTER — Telehealth: Payer: Self-pay | Admitting: Family Medicine

## 2020-07-07 NOTE — Telephone Encounter (Signed)
[facility] calling requesting to speak to nurse about PA, Call back #[phone], Reference# [id number]

## 2020-07-07 NOTE — Telephone Encounter (Signed)
Form for additional information received and filled out and faxed back to [facility] @ [phone].  Dm/cma

## 2020-07-12 DIAGNOSIS — H524 Presbyopia: Secondary | ICD-10-CM | POA: Diagnosis not present

## 2020-07-12 DIAGNOSIS — Z961 Presence of intraocular lens: Secondary | ICD-10-CM | POA: Diagnosis not present

## 2020-09-12 ENCOUNTER — Ambulatory Visit (INDEPENDENT_AMBULATORY_CARE_PROVIDER_SITE_OTHER): Payer: Medicare Other

## 2020-09-12 VITALS — Ht 66.0 in | Wt 207.0 lb

## 2020-09-12 DIAGNOSIS — Z0000 Encounter for general adult medical examination without abnormal findings: Secondary | ICD-10-CM | POA: Diagnosis not present

## 2020-09-12 NOTE — Progress Notes (Addendum)
Subjective:   Felicia Cruz is a 80 y.o. female who presents for Medicare Annual (Subsequent) preventive examination.  I connected with Zyanna today by telephone and verified that I am speaking with the correct person using two identifiers. Location patient: home Location provider: work Persons participating in the virtual visit: patient, Marine scientist.    I discussed the limitations, risks, security and privacy concerns of performing an evaluation and management service by telephone and the availability of in person appointments. I also discussed with the patient that there may be a patient responsible charge related to this service. The patient expressed understanding and verbally consented to this telephonic visit.    Interactive audio and video telecommunications were attempted between this provider and patient, however failed, due to patient having technical difficulties OR patient did not have access to video capability.  We continued and completed visit with audio only.  Some vital signs may be absent or patient reported.   Time Spent with patient on telephone encounter: 25 minutes   Review of Systems     Cardiac Risk Factors include: advanced age (>50men, >45 women);hypertension;dyslipidemia;sedentary lifestyle     Objective:    Today's Vitals   09/12/20 1328  Weight: 207 lb (93.9 kg)  Height: 5\' 6"  (1.676 m)  PainSc: 3    Body mass index is 33.41 kg/m.  Advanced Directives 09/12/2020 09/08/2019 09/02/2018 05/08/2017 06/13/2016 01/24/2015 04/26/2014  Does Patient Have a Medical Advance Directive? Yes Yes Yes Yes Yes Yes No  Type of Paramedic of [place];Living will [place];Living will [place];Living will [place];Living will [place];Living will [place] -  Does patient want to make changes to medical advance directive? - No - Patient declined No - Guardian  declined - - No - Patient declined -  Copy of [place] in Chart? No - copy requested No - copy requested No - copy requested - No - copy requested - -  Would patient like information on creating a medical advance directive? - - - - - - No - patient declined information  Pre-existing out of facility DNR order (yellow form or pink MOST form) - - - - - - -    Current Medications (verified) Outpatient Encounter Medications as of 09/12/2020  Medication Sig  . cholecalciferol (VITAMIN D3) 25 MCG (1000 UNIT) tablet Take 1,000 Units by mouth daily.  . cyanocobalamin (,VITAMIN B-12,) 1000 MCG/ML injection INJECT 1 ML INTO THE MUSCLE ONCE A MONTH  . diphenhydramine-acetaminophen (TYLENOL PM) 25-500 MG TABS tablet Take 1 tablet by mouth at bedtime as needed.  Marland Kitchen ELIQUIS 5 MG TABS tablet TAKE 1 TABLET(5 MG) BY MOUTH TWICE DAILY  . esomeprazole (NEXIUM) 20 MG capsule TAKE 1 CAPSULE BY MOUTH 2 TIMES DAILY BEFORE A MEAL.  Marland Kitchen levocetirizine (XYZAL) 5 MG tablet TAKE 1 TABLET BY MOUTH EVERY EVENING  . losartan-hydrochlorothiazide (HYZAAR) 100-12.5 MG tablet TAKE 1 TABLET BY MOUTH EVERY MORNING  . Melatonin 1 MG CAPS Take by mouth.  Marland Kitchen MYRBETRIQ 50 MG TB24 tablet TAKE 1 TABLET BY MOUTH DAILY  . rOPINIRole (REQUIP) 1 MG tablet TAKE 1 TABLET BY MOUTH EVERY NIGHT AT BEDTIME  . traMADol (ULTRAM) 50 MG tablet Take 1 tablet (50 mg total) by mouth every 12 (twelve) hours as needed.  Marland Kitchen albuterol (VENTOLIN HFA) 108 (90 BASE) MCG/ACT inhaler Inhale 2 puffs into the lungs every 4 (four) hours as needed for wheezing or  shortness of breath (or cough). (Patient not taking: No sig reported)  . calcium-vitamin D (OSCAL WITH D) 500-200 MG-UNIT tablet Take by mouth. (Patient not taking: Reported on 09/12/2020)  . CANNABIDIOL PO Take 300 mg by mouth 2 (two) times daily. cbd oil (Patient not taking: Reported on 09/12/2020)  . citalopram (CELEXA) 20 MG tablet 1-1/2 tablets at bedtime (Patient not taking: Reported on  09/12/2020)  . cyanocobalamin (,VITAMIN B-12,) 1000 MCG/ML injection INJECT 1 ML IN THE MUSCLE EVERY MONTH (Patient not taking: No sig reported)  . docusate sodium (COLACE) 100 MG capsule Take 1 capsule (100 mg total) by mouth 2 (two) times daily. (Patient not taking: No sig reported)  . fluticasone (FLONASE) 50 MCG/ACT nasal spray Place 2 sprays into both nostrils daily. (Patient not taking: Reported on 09/12/2020)  . hydrochlorothiazide (HYDRODIURIL) 12.5 MG tablet TAKE 1 TABLET(12.5 MG) BY MOUTH DAILY (Patient not taking: No sig reported)  . LORazepam (ATIVAN) 1 MG tablet TAKE 1 TABLET(1 MG) BY MOUTH AT BEDTIME (Patient not taking: Reported on 09/12/2020)  . pantoprazole (PROTONIX) 40 MG tablet Take 1 tablet (40 mg total) by mouth daily. (Patient not taking: Reported on 09/12/2020)  . SYRINGE-NEEDLE, DISP, 3 ML (BD INTEGRA SYRINGE) 25G X 1" 3 ML MISC USE TO INJECT 1 ML MONTHLY OF B12 (Patient not taking: No sig reported)  . [DISCONTINUED] losartan-hydrochlorothiazide (HYZAAR) 100-12.5 MG tablet TAKE 1 TABLET BY MOUTH EVERY MORNING   No facility-administered encounter medications on file as of 09/12/2020.    Allergies (verified) Atorvastatin, Gabapentin, Lyrica [pregabalin], Penicillins, Acyclovir and related, Famciclovir, Latex, and Methocarbamol   History: Past Medical History:  Diagnosis Date  . Alcoholism ([place])    HAS NOT HAD [place]  . Arthritis   . Asthma   . B12 deficiency   . Cataract   . Chronic back pain   . DDD (degenerative disc disease), lumbar   . Esophageal stricture 2010  . GERD (gastroesophageal reflux disease)   . Hiatal hernia   . Hyperlipemia   . Hypertension   . Iron deficiency anemia due to chronic blood loss 06/23/2017  . Kidney stones   . Osteopenia   . 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 RLS (restless legs syndrome)   . 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  ABDOMINOPLASTY    . DILATION AND CURETTAGE OF UTERUS  many yrs ago   x 2  . ESOPHAGEAL MANOMETRY  02/17/2012   Procedure: ESOPHAGEAL MANOMETRY (EM);  Surgeon: Pedro Earls, MD;  Location: [facility];  Service: Endoscopy;  Laterality: N/A;  . FACIAL COSMETIC SURGERY     eyes, nose  . JOINT REPLACEMENT     left  . LAPAROSCOPIC NISSEN FUNDOPLICATION  2/58/5277   Procedure: LAPAROSCOPIC NISSEN FUNDOPLICATION;  Surgeon: Pedro Earls, MD;  Location: [facility];  Service: General;  Laterality: N/A;  Laparoscopic Nissen repair of hiatal hernia  with lighted bougie  . LIPOSUCTION  15 to 20 yrs ago  . [place] SURGERY  2008  . OPEN SURGICAL REPAIR OF GLUTEAL TENDON Right 12/28/2012   Procedure: RIGHT GLUTEAL TENDON REPAIR;  Surgeon: Mauri Pole, MD;  Location: [facility];  Service: Orthopedics;  Laterality: Right;  . RIGHT INDEX FINGER SURGERY  06/19/11   AT [facility]  . SURGERY FOR KIDNEY STONES    . 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 Throat cancer Father        Smoker  . Heart disease Mother   . Hypertension Mother   . Hip fracture Mother   . Alzheimer's disease Mother   . Stroke Mother   . Stroke Brother 44       smoker  . Colon cancer Neg Hx    Social History   Socioeconomic History  . Marital status: Widowed    Spouse name: Not on file  . Number of children: 0  . Years of education: Not on file  . Highest education level: Not on file  Occupational History  . Occupation: Retired  Tobacco Use  . Smoking status: Former Smoker    Packs/day: 1.00    Years: 10.00    Pack years: 10.00    Types: Cigarettes    Quit date: 06/04/1971    Years since quitting: 49.3  . Smokeless tobacco: Never Used  Vaping Use  . Vaping Use: Never used  Substance and Sexual Activity  . Alcohol  use: No    Alcohol/week: 0.0 standard drinks    Comment: RECOVERING ALCOLHOLIC  . Drug use: No  . Sexual activity: Not on file  Other Topics Concern  . Not on file  Social History Narrative   Regular exercise-YES   Social Determinants of Health   Financial Resource Strain: Low Risk   . Difficulty of Paying Living Expenses: Not hard at all  Food Insecurity: No Food Insecurity  . Worried About Charity fundraiser in the Last Year: Never true  . Ran Out of Food in the Last Year: Never true  Transportation Needs: No Transportation Needs  . Lack of Transportation (Medical): No  . Lack of Transportation (Non-Medical): No  Physical Activity: Inactive  . Days of Exercise per Week: 0 days  . Minutes of Exercise per Session: 0 min  Stress: No Stress Concern Present  . Feeling of Stress : Not at all  Social Connections: Moderately Isolated  . Frequency of Communication with Friends and Family: More than three times a week  . Frequency of Social Gatherings with Friends and Family: More than three times a week  . Attends Religious Services: Never  . Active Member of Clubs or Organizations: Yes  . Attends Archivist Meetings: 1 to 4 times per year  . Marital Status: Widowed    Tobacco Counseling Counseling given: Not Answered   Clinical Intake:  Pre-visit preparation completed: Yes  Pain : 0-10 Pain Score: 3  Pain Type: Chronic pain Pain Location: Generalized (back, leg & hip-back surgery 2016-hip surgery-2014) Pain Onset: More than a month ago Pain Frequency: Constant     Nutritional Status: BMI > 30  Obese Nutritional Risks: None Diabetes: No  How often do you need to have someone help you when you read instructions, pamphlets, or other written materials from your doctor or pharmacy?: 1 - Never  Diabetic?No  Interpreter Needed?: No  Information entered by :: Caroleen Hamman LPN   Activities of Daily Living In your present state of health, do you have any  difficulty performing the following activities: 09/12/2020  Hearing? N  Vision? N  Difficulty concentrating or making decisions? N  Walking or climbing stairs? N  Dressing or bathing? N  Doing errands, shopping? N  Preparing Food and eating ? N  Using  the Toilet? N  In the past six months, have you accidently leaked urine? Y  Do you have problems with loss of bowel control? N  Managing your Medications? N  Managing your Finances? N  Housekeeping or managing your Housekeeping? N  Some recent data might be hidden    Patient Care Team: Ronnald Nian, DO as PCP - General (Family Medicine) Griselda Miner, MD as Consulting Physician (Dermatology) Volanda Napoleon, MD as Consulting Physician (Oncology)  Indicate any recent Medical Services you may have received from other than [facility] providers in the past year (date may be approximate).     Assessment:   This is a routine wellness examination for Rylen.  Hearing/Vision screen  Hearing Screening   125Hz  250Hz  500Hz  1000Hz  2000Hz  3000Hz  4000Hz  6000Hz  8000Hz   Right ear:           Left ear:           Comments: No issues  Vision Screening Comments: Reading glasses Last eye exam-08/2020-[facility]  Dietary issues and exercise activities discussed: Current Exercise Habits: The patient does not participate in regular exercise at present, Exercise limited by: orthopedic condition(s)  Goals    . Continue to eat a healthy diet and drink plenty of water.      Depression Screen PHQ 2/9 Scores 09/12/2020 09/08/2019 09/02/2018 06/13/2016 06/13/2016 03/04/2016 12/30/2014  PHQ - 2 Score 0 0 0 0 0 0 0    Fall Risk Fall Risk  09/12/2020 09/08/2019 09/02/2018 12/31/2017 06/13/2016  Falls in the past year? 1 1 0 No Yes  Comment - - - Emmi Telephone Survey: data to providers prior to load -  Number falls in past yr: 1 0 - - 2 or more  Injury with Fall? 0 1 - - No  Risk for fall due to : History of fall(s);Impaired balance/gait - - -  Impaired balance/gait  Follow up Falls prevention discussed Education provided;Falls prevention discussed - - Education provided;Falls prevention discussed    FALL RISK PREVENTION PERTAINING TO THE HOME:  Any stairs in or around the home? No  Home free of loose throw rugs in walkways, pet beds, electrical cords, etc? Yes  Adequate lighting in your home to reduce risk of falls? Yes   ASSISTIVE DEVICES UTILIZED TO PREVENT FALLS:  Life alert? No  Use of a cane, walker or w/c? Yes  Grab bars in the bathroom? Yes  Shower chair or bench in shower? No  Elevated toilet seat or a handicapped toilet? No   TIMED UP AND GO:  Was the test performed? No . Phone visit   Cognitive Function:Normal cognitive status assessed by this Nurse Health Advisor. No abnormalities found.   MMSE - Mini Mental State Exam 06/13/2016  Orientation to time 5  Orientation to Place 5  Registration 3  Attention/ Calculation 5  Recall 3  Language- name 2 objects 2  Language- repeat 1  Language- follow 3 step command 3  Language- read & follow direction 1  Write a sentence 1  Copy design 1  Total score 30        Immunizations Immunization History  Administered Date(s) Administered  . Influenza Split 03/31/2012  . Influenza Whole 03/09/2009, 03/19/2010  . Influenza, High Dose Seasonal PF 04/07/2013, 03/16/2014, 02/08/2015, 03/04/2016, 03/18/2018, 03/18/2019, 03/10/2020  . Moderna Sars-Covid-2 Vaccination 06/12/2019, 07/27/2019, 03/03/2020  . Pneumococcal Conjugate-13 09/09/2013  . Pneumococcal Polysaccharide-23 03/10/2008  . Td 06/09/2006  . Tdap 03/18/2018  . Zoster 06/26/2006  . Zoster Recombinat (Shingrix)  09/01/2017    TDAP status: Up to date  Flu Vaccine status: Up to date  Pneumococcal vaccine status: Up to date  Covid-19 vaccine status: Completed vaccines  Qualifies for Shingles Vaccine? No   Zostavax completed Yes   Shingrix Completed?: Yes  Screening Tests Health Maintenance   Topic Date Due  . Hepatitis C Screening  Never done  . MAMMOGRAM  07/09/2018  . DEXA SCAN  04/10/2020  . INFLUENZA VACCINE  01/01/2021  . COLONOSCOPY (Pts 45-19yrs Insurance coverage will need to be confirmed)  01/18/2026  . TETANUS/TDAP  03/18/2028  . COVID-19 Vaccine  Completed  . PNA vac Low Risk Adult  Completed  . HPV VACCINES  Aged Out    Health Maintenance  Health Maintenance Due  Topic Date Due  . Hepatitis C Screening  Never done  . MAMMOGRAM  07/09/2018  . DEXA SCAN  04/10/2020    Colorectal cancer screening: No longer required.   Mammogram status: Completed 2021-per patient. Repeat every year  Bone Density status: Due-Declined today.  Lung Cancer Screening: (Low Dose CT Chest recommended if Age 56-80 years, 30 pack-year currently smoking OR have quit w/in 15years.) does not qualify.     Additional Screening:  Hepatitis C Screening: does not qualify  Vision Screening: Recommended annual ophthalmology exams for early detection of glaucoma and other disorders of the eye. Is the patient up to date with their annual eye exam?  Yes  Who is the provider or what is the name of the office in which the patient attends annual eye exams? [facility]   Dental Screening: Recommended annual dental exams for proper oral hygiene  Community Resource Referral / Chronic Care Management: CRR required this visit?  No   CCM required this visit?  No      Plan:     I have personally reviewed and noted the following in the patient's chart:   . Medical and social history . Use of alcohol, tobacco or illicit drugs  . Current medications and supplements . Functional ability and status . Nutritional status . Physical activity . Advanced directives . List of other physicians . Hospitalizations, surgeries, and ER visits in previous 12 months . Vitals . Screenings to include cognitive, depression, and falls . Referrals and appointments  In addition, I have  reviewed and discussed with patient certain preventive protocols, quality metrics, and best practice recommendations. A written personalized care plan for preventive services as well as general preventive health recommendations were provided to patient.    Due to this being a telephonic visit, the after visit summary with patients personalized plan was offered to patient via mail or my-chart.  Patient would like to access on my-chart.   Marta Antu, LPN   [id number]  Nurse Health Advisor  Nurse Notes: None

## 2020-09-12 NOTE — Patient Instructions (Signed)
Ms. Felicia Cruz , Thank you for taking time to complete your Medicare Wellness Visit. I appreciate your ongoing commitment to your health goals. Please review the following plan we discussed and let me know if I can assist you in the future.   Screening recommendations/referrals: Colonoscopy: No longer required Mammogram: Per our conversation , completed 2021.No results in chart. Bone Density: Due-Please call the office when you are ready to schedule. Recommended yearly ophthalmology/optometry visit for glaucoma screening and checkup Recommended yearly dental visit for hygiene and checkup  Vaccinations: Influenza vaccine: Up to date Pneumococcal vaccine: Completed vaccines Tdap vaccine: Up to date-Due-03/18/2028 Shingles vaccine: Completed vaccines   Covid-19:Completed vaccines  Advanced directives: Please bring a copy for your chart  Conditions/risks identified: See problem list  Next appointment: Follow up in one year for your annual wellness visit 09/18/2021 @ 1:30.   Preventive Care 61 Years and Older, Female Preventive care refers to lifestyle choices and visits with your health care provider that can promote health and wellness. What does preventive care include?  A yearly physical exam. This is also called an annual well check.  Dental exams once or twice a year.  Routine eye exams. Ask your health care provider how often you should have your eyes checked.  Personal lifestyle choices, including:  Daily care of your teeth and gums.  Regular physical activity.  Eating a healthy diet.  Avoiding tobacco and drug use.  Limiting alcohol use.  Practicing safe sex.  Taking low-dose aspirin every day.  Taking vitamin and mineral supplements as recommended by your health care provider. What happens during an annual well check? The services and screenings done by your health care provider during your annual well check will depend on your age, overall health, lifestyle risk  factors, and family history of disease. Counseling  Your health care provider may ask you questions about your:  Alcohol use.  Tobacco use.  Drug use.  Emotional well-being.  Home and relationship well-being.  Sexual activity.  Eating habits.  History of falls.  Memory and ability to understand (cognition).  Work and work Statistician.  Reproductive health. Screening  You may have the following tests or measurements:  Height, weight, and BMI.  Blood pressure.  Lipid and cholesterol levels. These may be checked every 5 years, or more frequently if you are over 41 years old.  Skin check.  Lung cancer screening. You may have this screening every year starting at age 15 if you have a 30-pack-year history of smoking and currently smoke or have quit within the past 15 years.  Fecal occult blood test (FOBT) of the stool. You may have this test every year starting at age 53.  Flexible sigmoidoscopy or colonoscopy. You may have a sigmoidoscopy every 5 years or a colonoscopy every 10 years starting at age 14.  Hepatitis C blood test.  Hepatitis B blood test.  Sexually transmitted disease (STD) testing.  Diabetes screening. This is done by checking your blood sugar (glucose) after you have not eaten for a while (fasting). You may have this done every 1-3 years.  Bone density scan. This is done to screen for osteoporosis. You may have this done starting at age 80.  Mammogram. This may be done every 1-2 years. Talk to your health care provider about how often you should have regular mammograms. Talk with your health care provider about your test results, treatment options, and if necessary, the need for more tests. Vaccines  Your health care provider may recommend certain  vaccines, such as:  Influenza vaccine. This is recommended every year.  Tetanus, diphtheria, and acellular pertussis (Tdap, Td) vaccine. You may need a Td booster every 10 years.  Zoster vaccine. You  may need this after age 64.  Pneumococcal 13-valent conjugate (PCV13) vaccine. One dose is recommended after age 59.  Pneumococcal polysaccharide (PPSV23) vaccine. One dose is recommended after age 57. Talk to your health care provider about which screenings and vaccines you need and how often you need them. This information is not intended to replace advice given to you by your health care provider. Make sure you discuss any questions you have with your health care provider. Document Released: 06/16/2015 Document Revised: 02/07/2016 Document Reviewed: 03/21/2015 Elsevier Interactive Patient Education  2017 [place] Prevention in the Home Falls can cause injuries. They can happen to people of all ages. There are many things you can do to make your home safe and to help prevent falls. What can I do on the outside of my home?  Regularly fix the edges of walkways and driveways and fix any cracks.  Remove anything that might make you trip as you walk through a door, such as a raised step or threshold.  Trim any bushes or trees on the path to your home.  Use bright outdoor lighting.  Clear any walking paths of anything that might make someone trip, such as rocks or tools.  Regularly check to see if handrails are loose or broken. Make sure that both sides of any steps have handrails.  Any raised decks and porches should have guardrails on the edges.  Have any leaves, snow, or ice cleared regularly.  Use sand or salt on walking paths during winter.  Clean up any spills in your garage right away. This includes oil or grease spills. What can I do in the bathroom?  Use night lights.  Install grab bars by the toilet and in the tub and shower. Do not use towel bars as grab bars.  Use non-skid mats or decals in the tub or shower.  If you need to sit down in the shower, use a plastic, non-slip stool.  Keep the floor dry. Clean up any water that spills on the floor as soon as  it happens.  Remove soap buildup in the tub or shower regularly.  Attach bath mats securely with double-sided non-slip rug tape.  Do not have throw rugs and other things on the floor that can make you trip. What can I do in the bedroom?  Use night lights.  Make sure that you have a light by your bed that is easy to reach.  Do not use any sheets or blankets that are too big for your bed. They should not hang down onto the floor.  Have a firm chair that has side arms. You can use this for support while you get dressed.  Do not have throw rugs and other things on the floor that can make you trip. What can I do in the kitchen?  Clean up any spills right away.  Avoid walking on wet floors.  Keep items that you use a lot in easy-to-reach places.  If you need to reach something above you, use a strong step stool that has a grab bar.  Keep electrical cords out of the way.  Do not use floor polish or wax that makes floors slippery. If you must use wax, use non-skid floor wax.  Do not have throw rugs and other things  on the floor that can make you trip. What can I do with my stairs?  Do not leave any items on the stairs.  Make sure that there are handrails on both sides of the stairs and use them. Fix handrails that are broken or loose. Make sure that handrails are as long as the stairways.  Check any carpeting to make sure that it is firmly attached to the stairs. Fix any carpet that is loose or worn.  Avoid having throw rugs at the top or bottom of the stairs. If you do have throw rugs, attach them to the floor with carpet tape.  Make sure that you have a light switch at the top of the stairs and the bottom of the stairs. If you do not have them, ask someone to add them for you. What else can I do to help prevent falls?  Wear shoes that:  Do not have high heels.  Have rubber bottoms.  Are comfortable and fit you well.  Are closed at the toe. Do not wear sandals.  If  you use a stepladder:  Make sure that it is fully opened. Do not climb a closed stepladder.  Make sure that both sides of the stepladder are locked into place.  Ask someone to hold it for you, if possible.  Clearly mark and make sure that you can see:  Any grab bars or handrails.  First and last steps.  Where the edge of each step is.  Use tools that help you move around (mobility aids) if they are needed. These include:  Canes.  Walkers.  Scooters.  Crutches.  Turn on the lights when you go into a dark area. Replace any light bulbs as soon as they burn out.  Set up your furniture so you have a clear path. Avoid moving your furniture around.  If any of your floors are uneven, fix them.  If there are any pets around you, be aware of where they are.  Review your medicines with your doctor. Some medicines can make you feel dizzy. This can increase your chance of falling. Ask your doctor what other things that you can do to help prevent falls. This information is not intended to replace advice given to you by your health care provider. Make sure you discuss any questions you have with your health care provider. Document Released: 03/16/2009 Document Revised: 10/26/2015 Document Reviewed: 06/24/2014 Elsevier Interactive Patient Education  2017 [employer].

## 2020-09-20 ENCOUNTER — Encounter: Payer: Self-pay | Admitting: Family Medicine

## 2020-09-20 ENCOUNTER — Ambulatory Visit (INDEPENDENT_AMBULATORY_CARE_PROVIDER_SITE_OTHER): Payer: Medicare Other | Admitting: Family Medicine

## 2020-09-20 ENCOUNTER — Other Ambulatory Visit: Payer: Self-pay

## 2020-09-20 VITALS — BP 126/70 | HR 71 | Temp 98.0°F | Ht 66.0 in | Wt 212.2 lb

## 2020-09-20 DIAGNOSIS — R600 Localized edema: Secondary | ICD-10-CM | POA: Diagnosis not present

## 2020-09-20 DIAGNOSIS — I2782 Chronic pulmonary embolism: Secondary | ICD-10-CM

## 2020-09-20 DIAGNOSIS — E785 Hyperlipidemia, unspecified: Secondary | ICD-10-CM

## 2020-09-20 DIAGNOSIS — I2602 Saddle embolus of pulmonary artery with acute cor pulmonale: Secondary | ICD-10-CM

## 2020-09-20 DIAGNOSIS — E538 Deficiency of other specified B group vitamins: Secondary | ICD-10-CM | POA: Diagnosis not present

## 2020-09-20 DIAGNOSIS — K219 Gastro-esophageal reflux disease without esophagitis: Secondary | ICD-10-CM

## 2020-09-20 DIAGNOSIS — M549 Dorsalgia, unspecified: Secondary | ICD-10-CM

## 2020-09-20 DIAGNOSIS — R32 Unspecified urinary incontinence: Secondary | ICD-10-CM | POA: Diagnosis not present

## 2020-09-20 DIAGNOSIS — M810 Age-related osteoporosis without current pathological fracture: Secondary | ICD-10-CM | POA: Diagnosis not present

## 2020-09-20 DIAGNOSIS — I10 Essential (primary) hypertension: Secondary | ICD-10-CM

## 2020-09-20 DIAGNOSIS — G8929 Other chronic pain: Secondary | ICD-10-CM | POA: Diagnosis not present

## 2020-09-20 DIAGNOSIS — G2581 Restless legs syndrome: Secondary | ICD-10-CM | POA: Diagnosis not present

## 2020-09-20 MED ORDER — FUROSEMIDE 20 MG PO TABS: 20.0000 mg | ORAL_TABLET | Freq: Every day | ORAL | 2 refills | Status: AC | PRN

## 2020-09-20 MED ORDER — TRAMADOL HCL 50 MG PO TABS: 50.0000 mg | ORAL_TABLET | Freq: Two times a day (BID) | ORAL | 0 refills | Status: AC | PRN

## 2020-09-20 MED ORDER — ELIQUIS 5 MG PO TABS: ORAL_TABLET | ORAL | 3 refills | Status: AC

## 2020-09-20 NOTE — Progress Notes (Signed)
Felicia Cruz is a 80 y.o. female  Chief Complaint  Patient presents with  . Annual Exam    Pt not fasting today for labs.  Pt c/o frequent urination, pt explains that her bladder medication is not working.  Pt asking for refills today.  Pt last Bone DX was last yr and pt has a appointment for mammo next wk.    HPI: Felicia Cruz is a 80 y.o. female seen today for annual exam and f/u on chronic medical conditions including HTN, HDL, osteoporosis, GERD, B12 deficiency, RLS.   Last mammo: has appt next week Last Dexa: 04/2018 - due  Last colonoscopy: no longer needed Immunizations: UTD Dental: UTD, had appt this past Monday Vision: UTD  Med refills needed today? Yes see orders  Pt is on myrbetriq but states she wakes 4 times per night. She is not sure it helps at all. She would like to stop and see what her baseline is. Not sure if she is willing to try another med. She does drink a lot of water during the day.  She notes LE edema, no SOB. Edema worse as day goes on. She is more sedentary than she used to be d/t back pain. Swelling improves overnight but does not go away.   She takes tramadol PRN for back pain. Some days she takes it and other days she does not.   Past Medical History:  Diagnosis Date  . Alcoholism ([place])    HAS NOT HAD [place]  . Arthritis   . Asthma   . B12 deficiency   . Cataract   . Chronic back pain   . DDD (degenerative disc disease), lumbar   . Esophageal stricture 2010  . GERD (gastroesophageal reflux disease)   . Hiatal hernia   . Hyperlipemia   . Hypertension   . Iron deficiency anemia due to chronic blood loss 06/23/2017  . Kidney stones   . Osteopenia   . 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 RLS (restless legs syndrome)   . 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 ABDOMINOPLASTY    . DILATION AND CURETTAGE OF UTERUS  many yrs ago   x 2  .  ESOPHAGEAL MANOMETRY  02/17/2012   Procedure: ESOPHAGEAL MANOMETRY (EM);  Surgeon: Pedro Earls, MD;  Location: [facility];  Service: Endoscopy;  Laterality: N/A;  . FACIAL COSMETIC SURGERY     eyes, nose  . JOINT REPLACEMENT     left  . LAPAROSCOPIC NISSEN FUNDOPLICATION  2/59/5638   Procedure: LAPAROSCOPIC NISSEN FUNDOPLICATION;  Surgeon: Pedro Earls, MD;  Location: [facility];  Service: General;  Laterality: N/A;  Laparoscopic Nissen repair of hiatal hernia  with lighted bougie  . LIPOSUCTION  15 to 20 yrs ago  . [place] SURGERY  2008  . OPEN SURGICAL REPAIR OF GLUTEAL TENDON Right 12/28/2012   Procedure: RIGHT GLUTEAL TENDON REPAIR;  Surgeon: Mauri Pole, MD;  Location: [facility];  Service: Orthopedics;  Laterality: Right;  . RIGHT INDEX FINGER SURGERY  06/19/11   AT [facility]  . SURGERY FOR KIDNEY STONES    . 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   Procedure: TOTAL KNEE ARTHROPLASTY;  Surgeon: Mauri Pole, MD;  Location: [facility];  Service: Orthopedics;  Laterality: Left;    Social History   Socioeconomic History  . Marital status: Widowed  Spouse name: Not on file  . Number of children: 0  . Years of education: Not on file  . Highest education level: Not on file  Occupational History  . Occupation: Retired  Tobacco Use  . Smoking status: Former Smoker    Packs/day: 1.00    Years: 10.00    Pack years: 10.00    Types: Cigarettes    Quit date: 06/04/1971    Years since quitting: 49.3  . Smokeless tobacco: Never Used  Vaping Use  . Vaping Use: Never used  Substance and Sexual Activity  . Alcohol use: No    Alcohol/week: 0.0 standard drinks    Comment: RECOVERING ALCOLHOLIC  . Drug use: No  . Sexual activity: Not on file  Other Topics Concern  . Not on file  Social History Narrative   Regular exercise-YES   Social Determinants of Health   Financial Resource Strain: Low Risk   . Difficulty of Paying Living Expenses: Not hard at all   Food Insecurity: No Food Insecurity  . Worried About Charity fundraiser in the Last Year: Never true  . Ran Out of Food in the Last Year: Never true  Transportation Needs: No Transportation Needs  . Lack of Transportation (Medical): No  . Lack of Transportation (Non-Medical): No  Physical Activity: Inactive  . Days of Exercise per Week: 0 days  . Minutes of Exercise per Session: 0 min  Stress: No Stress Concern Present  . Feeling of Stress : Not at all  Social Connections: Moderately Isolated  . Frequency of Communication with Friends and Family: More than three times a week  . Frequency of Social Gatherings with Friends and Family: More than three times a week  . Attends Religious Services: Never  . Active Member of Clubs or Organizations: Yes  . Attends Archivist Meetings: 1 to 4 times per year  . Marital Status: Widowed  Intimate Partner Violence: Not At Risk  . Fear of Current or Ex-Partner: No  . Emotionally Abused: No  . Physically Abused: No  . Sexually Abused: No    Family History  Problem Relation Age of Onset  . Throat cancer Father        Smoker  . Heart disease Mother   . Hypertension Mother   . Hip fracture Mother   . Alzheimer's disease Mother   . Stroke Mother   . Stroke Brother 28       smoker  . Colon cancer Neg Hx      Immunization History  Administered Date(s) Administered  . Influenza Split 03/31/2012  . Influenza Whole 03/09/2009, 03/19/2010  . Influenza, High Dose Seasonal PF 04/07/2013, 03/16/2014, 02/08/2015, 03/04/2016, 03/18/2018, 03/18/2019, 03/10/2020  . Moderna Sars-Covid-2 Vaccination 06/12/2019, 07/27/2019, 03/03/2020  . Pneumococcal Conjugate-13 09/09/2013  . Pneumococcal Polysaccharide-23 03/10/2008  . Td 06/09/2006  . Tdap 03/18/2018  . Zoster 06/26/2006  . Zoster Recombinat (Shingrix) 09/01/2017    Outpatient Encounter Medications as of 09/20/2020  Medication Sig Note  . cholecalciferol (VITAMIN D3) 25 MCG (1000  UNIT) tablet Take 1,000 Units by mouth daily.   . cyanocobalamin (,VITAMIN B-12,) 1000 MCG/ML injection INJECT 1 ML INTO THE MUSCLE ONCE A MONTH 06/13/2016: Received from: External Pharmacy  . diphenhydramine-acetaminophen (TYLENOL PM) 25-500 MG TABS tablet Take 1 tablet by mouth at bedtime as needed.   Marland Kitchen ELIQUIS 5 MG TABS tablet TAKE 1 TABLET(5 MG) BY MOUTH TWICE DAILY   . esomeprazole (NEXIUM) 20 MG capsule TAKE 1 CAPSULE BY MOUTH  2 TIMES DAILY BEFORE A MEAL.   Marland Kitchen losartan-hydrochlorothiazide (HYZAAR) 100-12.5 MG tablet TAKE 1 TABLET BY MOUTH EVERY MORNING   . Melatonin 1 MG CAPS Take by mouth.   Marland Kitchen MYRBETRIQ 50 MG TB24 tablet TAKE 1 TABLET BY MOUTH DAILY   . rOPINIRole (REQUIP) 1 MG tablet TAKE 1 TABLET BY MOUTH EVERY NIGHT AT BEDTIME   . traMADol (ULTRAM) 50 MG tablet Take 1 tablet (50 mg total) by mouth every 12 (twelve) hours as needed.   Marland Kitchen levocetirizine (XYZAL) 5 MG tablet TAKE 1 TABLET BY MOUTH EVERY EVENING (Patient not taking: Reported on 09/20/2020)   . [DISCONTINUED] albuterol (VENTOLIN HFA) 108 (90 BASE) MCG/ACT inhaler Inhale 2 puffs into the lungs every 4 (four) hours as needed for wheezing or shortness of breath (or cough). (Patient not taking: No sig reported)   . [DISCONTINUED] calcium-vitamin D (OSCAL WITH D) 500-200 MG-UNIT tablet Take by mouth. (Patient not taking: Reported on 09/12/2020) 01/05/2016: Received from: [facility]: Take 1 tablet by mouth 2 times daily with meals.  . [DISCONTINUED] CANNABIDIOL PO Take 300 mg by mouth 2 (two) times daily. cbd oil (Patient not taking: Reported on 09/12/2020)   . [DISCONTINUED] citalopram (CELEXA) 20 MG tablet 1-1/2 tablets at bedtime (Patient not taking: Reported on 09/12/2020)   . [DISCONTINUED] cyanocobalamin (,VITAMIN B-12,) 1000 MCG/ML injection INJECT 1 ML IN THE MUSCLE EVERY MONTH (Patient not taking: No sig reported)   . [DISCONTINUED] docusate sodium (COLACE) 100 MG capsule Take 1 capsule (100 mg  total) by mouth 2 (two) times daily. (Patient not taking: No sig reported)   . [DISCONTINUED] fluticasone (FLONASE) 50 MCG/ACT nasal spray Place 2 sprays into both nostrils daily. (Patient not taking: Reported on 09/12/2020)   . [DISCONTINUED] hydrochlorothiazide (HYDRODIURIL) 12.5 MG tablet TAKE 1 TABLET(12.5 MG) BY MOUTH DAILY (Patient not taking: No sig reported)   . [DISCONTINUED] LORazepam (ATIVAN) 1 MG tablet TAKE 1 TABLET(1 MG) BY MOUTH AT BEDTIME (Patient not taking: Reported on 09/12/2020)   . [DISCONTINUED] pantoprazole (PROTONIX) 40 MG tablet Take 1 tablet (40 mg total) by mouth daily. (Patient not taking: Reported on 09/12/2020)   . [DISCONTINUED] SYRINGE-NEEDLE, DISP, 3 ML (BD INTEGRA SYRINGE) 25G X 1" 3 ML MISC USE TO INJECT 1 ML MONTHLY OF B12 (Patient not taking: No sig reported)    No facility-administered encounter medications on file as of 09/20/2020.     ROS: Pertinent positives and negatives noted in HPI. Remainder of ROS non-contributory    Allergies  Allergen Reactions  . Atorvastatin Other (See Comments)    Memory loss, confusion, hallucinations Amnesia  . Gabapentin Other (See Comments)    Chest pain  . Lyrica [Pregabalin] Other (See Comments)    Water blisters on face  . Penicillins Hives, Shortness Of Breath, Itching and Anaphylaxis  . Acyclovir And Related   . Famciclovir Other (See Comments)  . Latex Other (See Comments) and Rash    Skin peeling (thumb)  . Methocarbamol Other (See Comments)    Blisters on face    BP 126/70 (BP Location: Left Arm, Patient Position: Sitting, Cuff Size: Normal)   Pulse 71   Temp 98 F (36.7 C) (Oral)   Ht 5\' 6"  (1.676 m)   Wt 212 lb 3.2 oz (96.3 kg)   SpO2 97%   BMI 34.25 kg/m   Wt Readings from Last 3 Encounters:  09/20/20 212 lb 3.2 oz (96.3 kg)  09/12/20 207 lb (93.9 kg)  02/16/19 207  lb 4 oz (94 kg)   Temp Readings from Last 3 Encounters:  09/20/20 98 F (36.7 C) (Oral)  02/16/19 98 F (36.7 C)  01/19/19  97.8 F (36.6 C)   BP Readings from Last 3 Encounters:  09/20/20 126/70  02/16/19 134/72  01/19/19 (!) 117/51   Pulse Readings from Last 3 Encounters:  09/20/20 71  02/16/19 89  01/19/19 75     Physical Exam Constitutional:      General: She is not in acute distress.    Appearance: She is well-developed.  Eyes:     Conjunctiva/sclera: Conjunctivae normal.  Neck:     Thyroid: No thyromegaly.  Cardiovascular:     Rate and Rhythm: Normal rate and regular rhythm.     Pulses: Normal pulses.  Pulmonary:     Effort: Pulmonary effort is normal. No respiratory distress.     Breath sounds: Normal breath sounds. No wheezing or rhonchi.  Abdominal:     General: Bowel sounds are normal. There is no distension.     Palpations: Abdomen is soft. There is no mass.     Tenderness: There is no abdominal tenderness.  Musculoskeletal:     Cervical back: Neck supple.     Right lower leg: Edema present.     Left lower leg: Edema present.  Lymphadenopathy:     Cervical: No cervical adenopathy.  Skin:    General: Skin is warm and dry.  Neurological:     Mental Status: She is alert and oriented to person, place, and time.     Motor: No abnormal muscle tone.     Coordination: Coordination normal.     Gait: Gait abnormal.  Psychiatric:        Behavior: Behavior normal.      A/P:  1. Essential hypertension, benign - controlled, at goal - cont hyzaar 100-12.5mg  daily - Comprehensive metabolic panel; Future - CBC; Future  2. Osteoporosis without current pathological fracture, unspecified osteoporosis type -on Vit D 1000IU daily - VITAMIN D 25 Hydroxy (Vit-D Deficiency, Fractures); Future - DG Bone Density; Future  3. Gastroesophageal reflux disease, unspecified whether esophagitis present - stable, controlled - cont nexium 20mg  daily  4. Hyperlipidemia LDL goal <100 - not on statin - Comprehensive metabolic panel; Future - Lipid panel; Future  5. 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 B12 deficiency - Vitamin B12; Future  7. Restless leg - stable, at baseline - cont requip 1mg  qHS  8. Urinary incontinence, unspecified type - on myrbetriq 50mg  qHS but still up 4x/night - pt would like to stop med bc she is not sure how effective it is. If she finds she is going more at night off the med, she will restart  9. Bilateral leg edema Rx: - furosemide (LASIX) 20 MG tablet; Take 1 tablet (20 mg total) by mouth daily as needed.  Dispense: 30 tablet; Refill: 2 - pt to take PRN - elevated legs when seated, limit sodium - consider compression stockings  10. Chronic back pain greater than 3 months duration - database reviewed and appropriate Refill:- traMADol (ULTRAM) 50 MG tablet; Take 1 tablet (50 mg total) by mouth every 12 (twelve) hours as needed.  Dispense: 60 tablet; Refill: 0 - f/u in 6 mo or sooner PRN  This visit occurred during the SARS-CoV-2 public health emergency.  Safety protocols  were in place, including screening questions prior to the visit, additional usage of staff PPE, and extensive cleaning of exam room while observing appropriate contact time as indicated for disinfecting solutions.

## 2020-09-21 ENCOUNTER — Other Ambulatory Visit (INDEPENDENT_AMBULATORY_CARE_PROVIDER_SITE_OTHER): Payer: Medicare Other

## 2020-09-21 DIAGNOSIS — I10 Essential (primary) hypertension: Secondary | ICD-10-CM

## 2020-09-21 DIAGNOSIS — E785 Hyperlipidemia, unspecified: Secondary | ICD-10-CM

## 2020-09-21 DIAGNOSIS — M810 Age-related osteoporosis without current pathological fracture: Secondary | ICD-10-CM

## 2020-09-21 DIAGNOSIS — E538 Deficiency of other specified B group vitamins: Secondary | ICD-10-CM | POA: Diagnosis not present

## 2020-09-21 LAB — CBC
HCT: 39.1 % (ref 36.0–46.0)
Hemoglobin: 13.1 g/dL (ref 12.0–15.0)
MCHC: 33.5 g/dL (ref 30.0–36.0)
MCV: 91.8 fl (ref 78.0–100.0)
Platelets: 314 10*3/uL (ref 150.0–400.0)
RBC: 4.26 Mil/uL (ref 3.87–5.11)
RDW: 13.7 % (ref 11.5–15.5)
WBC: 4.6 10*3/uL (ref 4.0–10.5)

## 2020-09-21 LAB — COMPREHENSIVE METABOLIC PANEL
ALT: 28 U/L (ref 0–35)
AST: 22 U/L (ref 0–37)
Albumin: 4.2 g/dL (ref 3.5–5.2)
Alkaline Phosphatase: 42 U/L (ref 39–117)
BUN: 20 mg/dL (ref 6–23)
CO2: 24 mEq/L (ref 19–32)
Calcium: 9.8 mg/dL (ref 8.4–10.5)
Chloride: 104 mEq/L (ref 96–112)
Creatinine, Ser: 1.03 mg/dL (ref 0.40–1.20)
GFR: 51.67 mL/min — ABNORMAL LOW (ref >60.00)
Glucose, Bld: 100 mg/dL — ABNORMAL HIGH (ref 70–99)
Potassium: 3.9 mEq/L (ref 3.5–5.1)
Sodium: 139 mEq/L (ref 135–145)
Total Bilirubin: 0.6 mg/dL (ref 0.2–1.2)
Total Protein: 6.7 g/dL (ref 6.0–8.3)

## 2020-09-21 LAB — LIPID PANEL
Cholesterol: 215 mg/dL — ABNORMAL HIGH (ref 0–200)
HDL: 64.3 mg/dL (ref >39.00)
LDL Cholesterol: 120 mg/dL — ABNORMAL HIGH (ref 0–99)
NonHDL: 150.53
Total CHOL/HDL Ratio: 3
Triglycerides: 153 mg/dL — ABNORMAL HIGH (ref 0.0–149.0)
VLDL: 30.6 mg/dL (ref 0.0–40.0)

## 2020-09-21 LAB — VITAMIN B12: Vitamin B-12: >1506 pg/mL — ABNORMAL HIGH (ref 211–911)

## 2020-09-21 LAB — VITAMIN D 25 HYDROXY (VIT D DEFICIENCY, FRACTURES): VITD: 97.67 ng/mL (ref 30.00–100.00)

## 2020-09-21 NOTE — Progress Notes (Signed)
Per orders of Dr. Bryan Lemma pt is here for labs, pt tolerated lab draw well.

## 2020-10-05 DIAGNOSIS — Z1231 Encounter for screening mammogram for malignant neoplasm of breast: Secondary | ICD-10-CM | POA: Diagnosis not present

## 2020-10-05 LAB — HM MAMMOGRAPHY

## 2020-11-28 ENCOUNTER — Other Ambulatory Visit: Payer: Self-pay

## 2020-11-28 MED ORDER — LOSARTAN POTASSIUM-HCTZ 100-12.5 MG PO TABS: 1.0000 | ORAL_TABLET | Freq: Every morning | ORAL | 0 refills | Status: DC

## 2020-11-28 NOTE — Telephone Encounter (Signed)
Last OV 09/20/20 Last fill 05/24/20  #90/0

## 2020-12-31 DIAGNOSIS — H538 Other visual disturbances: Secondary | ICD-10-CM | POA: Diagnosis not present

## 2020-12-31 DIAGNOSIS — H3322 Serous retinal detachment, left eye: Secondary | ICD-10-CM | POA: Diagnosis not present

## 2020-12-31 DIAGNOSIS — Z9842 Cataract extraction status, left eye: Secondary | ICD-10-CM | POA: Diagnosis not present

## 2020-12-31 DIAGNOSIS — Z8669 Personal history of other diseases of the nervous system and sense organs: Secondary | ICD-10-CM | POA: Diagnosis not present

## 2020-12-31 DIAGNOSIS — Z9841 Cataract extraction status, right eye: Secondary | ICD-10-CM | POA: Diagnosis not present

## 2020-12-31 DIAGNOSIS — Z880 Allergy status to penicillin: Secondary | ICD-10-CM | POA: Diagnosis not present

## 2020-12-31 DIAGNOSIS — R9389 Abnormal findings on diagnostic imaging of other specified body structures: Secondary | ICD-10-CM | POA: Diagnosis not present

## 2021-01-01 DIAGNOSIS — H43812 Vitreous degeneration, left eye: Secondary | ICD-10-CM | POA: Diagnosis not present

## 2021-01-02 DIAGNOSIS — H43393 Other vitreous opacities, bilateral: Secondary | ICD-10-CM | POA: Diagnosis not present

## 2021-01-02 DIAGNOSIS — H43813 Vitreous degeneration, bilateral: Secondary | ICD-10-CM | POA: Diagnosis not present

## 2021-01-02 DIAGNOSIS — H4312 Vitreous hemorrhage, left eye: Secondary | ICD-10-CM | POA: Diagnosis not present

## 2021-01-03 ENCOUNTER — Telehealth: Payer: Self-pay

## 2021-01-03 NOTE — Telephone Encounter (Signed)
Received VM from pt left late yesterday stating she is having eye surgery tomorrow and the surgeon wishes for her to hold her Eliquis from last pm through Friday. Dr Marin Olp agrees with these instructions. Pt notified via phone and verbalizes understanding using teachback. dph

## 2021-01-04 DIAGNOSIS — H4312 Vitreous hemorrhage, left eye: Secondary | ICD-10-CM | POA: Diagnosis not present

## 2021-01-04 DIAGNOSIS — H33312 Horseshoe tear of retina without detachment, left eye: Secondary | ICD-10-CM | POA: Diagnosis not present

## 2021-01-04 DIAGNOSIS — H33332 Multiple defects of retina without detachment, left eye: Secondary | ICD-10-CM | POA: Diagnosis not present

## 2021-01-04 DIAGNOSIS — H43392 Other vitreous opacities, left eye: Secondary | ICD-10-CM | POA: Diagnosis not present

## 2021-01-05 DIAGNOSIS — H31092 Other chorioretinal scars, left eye: Secondary | ICD-10-CM | POA: Diagnosis not present

## 2021-01-05 DIAGNOSIS — H33332 Multiple defects of retina without detachment, left eye: Secondary | ICD-10-CM | POA: Diagnosis not present

## 2021-01-09 DIAGNOSIS — H33332 Multiple defects of retina without detachment, left eye: Secondary | ICD-10-CM | POA: Diagnosis not present

## 2021-01-09 DIAGNOSIS — H43391 Other vitreous opacities, right eye: Secondary | ICD-10-CM | POA: Diagnosis not present

## 2021-01-30 DIAGNOSIS — H43391 Other vitreous opacities, right eye: Secondary | ICD-10-CM | POA: Diagnosis not present

## 2021-03-08 DIAGNOSIS — Z23 Encounter for immunization: Secondary | ICD-10-CM | POA: Diagnosis not present

## 2021-03-22 DIAGNOSIS — L821 Other seborrheic keratosis: Secondary | ICD-10-CM | POA: Diagnosis not present

## 2021-03-22 DIAGNOSIS — D485 Neoplasm of uncertain behavior of skin: Secondary | ICD-10-CM | POA: Diagnosis not present

## 2021-03-22 DIAGNOSIS — Z85828 Personal history of other malignant neoplasm of skin: Secondary | ICD-10-CM | POA: Diagnosis not present

## 2021-03-22 DIAGNOSIS — L570 Actinic keratosis: Secondary | ICD-10-CM | POA: Diagnosis not present

## 2021-03-22 DIAGNOSIS — C44622 Squamous cell carcinoma of skin of right upper limb, including shoulder: Secondary | ICD-10-CM | POA: Diagnosis not present

## 2021-03-22 DIAGNOSIS — L820 Inflamed seborrheic keratosis: Secondary | ICD-10-CM | POA: Diagnosis not present

## 2021-04-18 ENCOUNTER — Other Ambulatory Visit: Payer: Self-pay | Admitting: Family

## 2021-04-18 ENCOUNTER — Telehealth: Payer: Self-pay | Admitting: Family Medicine

## 2021-04-18 MED ORDER — LOSARTAN POTASSIUM-HCTZ 100-12.5 MG PO TABS: 1.0000 | ORAL_TABLET | Freq: Every morning | ORAL | 0 refills | Status: AC

## 2021-04-18 NOTE — Telephone Encounter (Signed)
I will sned this message to Dutch Quint, but can you please and thank youcall her back to schedule a TOC due to the appointment in [place] is with the nurse not a Doctor.   Thanks. Dm/cma

## 2021-04-18 NOTE — Telephone Encounter (Signed)
Called patient per request.  Discussed with P.Webb, who is reviewing chart/refill.  Patient advises she has an appointment 12/6 with new PCP who would not refill until 12/6 appointment.  Called patient back and advised RX should be at pharmacy 4:30-5p today.

## 2021-04-18 NOTE — Telephone Encounter (Signed)
Dr Lurline Del pt... needs her Losartan refilled, her blood pressure is 188/90, I transferred her to nurse triage. Her appt with another doctor is not until April.Marland KitchenMarland Kitchen

## 2021-04-18 NOTE — Telephone Encounter (Signed)
Patient needs a refill on Losartan/HCTZ.  LR  11/28/20, #90. 0 rf LOV  09/20/20 FOV   none scheduled.   Please review and advise.  Thanks.   Dm/cma

## 2021-04-18 NOTE — Telephone Encounter (Addendum)
Felicia Cruz, I was mistaken.. she mentioned she has an appt with another Doctor and I only saw that appt in April & assumed. I believe her appt is with a completely different practice outside of [facility] She informed me that they told her they can't fill anything until they see her.

## 2021-04-19 NOTE — Telephone Encounter (Signed)
Patient notified VIA phone that rx was sent to the pharmacy. Dm/cma

## 2021-05-08 DIAGNOSIS — N3281 Overactive bladder: Secondary | ICD-10-CM | POA: Diagnosis not present

## 2021-05-08 DIAGNOSIS — Z23 Encounter for immunization: Secondary | ICD-10-CM | POA: Diagnosis not present

## 2021-05-08 DIAGNOSIS — M810 Age-related osteoporosis without current pathological fracture: Secondary | ICD-10-CM | POA: Diagnosis not present

## 2021-05-08 DIAGNOSIS — Z86718 Personal history of other venous thrombosis and embolism: Secondary | ICD-10-CM | POA: Diagnosis not present

## 2021-05-08 DIAGNOSIS — I10 Essential (primary) hypertension: Secondary | ICD-10-CM | POA: Diagnosis not present

## 2021-06-07 DIAGNOSIS — M810 Age-related osteoporosis without current pathological fracture: Secondary | ICD-10-CM | POA: Diagnosis not present

## 2021-06-18 DIAGNOSIS — M810 Age-related osteoporosis without current pathological fracture: Secondary | ICD-10-CM | POA: Diagnosis not present

## 2021-06-18 DIAGNOSIS — Z780 Asymptomatic menopausal state: Secondary | ICD-10-CM | POA: Diagnosis not present

## 2021-09-13 DIAGNOSIS — E538 Deficiency of other specified B group vitamins: Secondary | ICD-10-CM | POA: Diagnosis not present

## 2021-09-13 DIAGNOSIS — I10 Essential (primary) hypertension: Secondary | ICD-10-CM | POA: Diagnosis not present

## 2021-09-13 DIAGNOSIS — R7989 Other specified abnormal findings of blood chemistry: Secondary | ICD-10-CM | POA: Diagnosis not present

## 2021-09-18 ENCOUNTER — Ambulatory Visit: Payer: Medicare Other

## 2021-09-20 DIAGNOSIS — Z0000 Encounter for general adult medical examination without abnormal findings: Secondary | ICD-10-CM | POA: Diagnosis not present

## 2021-09-20 DIAGNOSIS — N3281 Overactive bladder: Secondary | ICD-10-CM | POA: Diagnosis not present

## 2021-09-20 DIAGNOSIS — M810 Age-related osteoporosis without current pathological fracture: Secondary | ICD-10-CM | POA: Diagnosis not present

## 2021-09-20 DIAGNOSIS — N1831 Chronic kidney disease, stage 3a: Secondary | ICD-10-CM | POA: Diagnosis not present

## 2021-09-20 DIAGNOSIS — E785 Hyperlipidemia, unspecified: Secondary | ICD-10-CM | POA: Diagnosis not present

## 2021-09-20 DIAGNOSIS — M25472 Effusion, left ankle: Secondary | ICD-10-CM | POA: Diagnosis not present

## 2021-09-20 DIAGNOSIS — I129 Hypertensive chronic kidney disease with stage 1 through stage 4 chronic kidney disease, or unspecified chronic kidney disease: Secondary | ICD-10-CM | POA: Diagnosis not present

## 2021-09-20 DIAGNOSIS — K921 Melena: Secondary | ICD-10-CM | POA: Diagnosis not present

## 2021-09-20 DIAGNOSIS — E538 Deficiency of other specified B group vitamins: Secondary | ICD-10-CM | POA: Diagnosis not present

## 2021-09-20 DIAGNOSIS — Z1331 Encounter for screening for depression: Secondary | ICD-10-CM | POA: Diagnosis not present

## 2021-09-20 DIAGNOSIS — Z86718 Personal history of other venous thrombosis and embolism: Secondary | ICD-10-CM | POA: Diagnosis not present

## 2021-09-20 DIAGNOSIS — I10 Essential (primary) hypertension: Secondary | ICD-10-CM | POA: Diagnosis not present

## 2021-09-20 DIAGNOSIS — Z1339 Encounter for screening examination for other mental health and behavioral disorders: Secondary | ICD-10-CM | POA: Diagnosis not present

## 2021-09-21 ENCOUNTER — Telehealth: Payer: Self-pay | Admitting: Gastroenterology

## 2021-09-21 ENCOUNTER — Other Ambulatory Visit (HOSPITAL_COMMUNITY): Payer: Self-pay | Admitting: Internal Medicine

## 2021-09-21 DIAGNOSIS — M7989 Other specified soft tissue disorders: Secondary | ICD-10-CM

## 2021-09-21 NOTE — Telephone Encounter (Signed)
Hi Dr. Bryan Lemma, ? ?We received a referral from PCP. Patient last OV with you was 02/16/2019. Per patient, she would like to transfer her care over to Dr. Lyndel Safe. Patient states the reason she wants to do this is personal. Are you okay with this? Please advise. Thank you ?

## 2021-09-21 NOTE — Telephone Encounter (Signed)
Scheduled pt in first available APP appt for 09/26/21 at 1:30pm. If pt starts to feel dizzy, light headed, feels faint, short of breath prior to her appt she should go to the ER. Please let pt know. ?

## 2021-09-21 NOTE — Telephone Encounter (Signed)
[place] with me.  She will be in great hands with Dr. Lyndel Safe. ?

## 2021-09-24 ENCOUNTER — Ambulatory Visit (HOSPITAL_COMMUNITY)
Admission: RE | Admit: 2021-09-24 | Discharge: 2021-09-24 | Disposition: A | Discharge status: Home / Self Care | Payer: Medicare Other | Source: Ambulatory Visit | Attending: Cardiology | Admitting: Cardiology

## 2021-09-24 DIAGNOSIS — M7989 Other specified soft tissue disorders: Secondary | ICD-10-CM | POA: Diagnosis not present

## 2021-09-24 NOTE — Telephone Encounter (Signed)
Hi Dr. Lyndel Safe, ? ?Are you okay with patient transferring her care over to you? ? ?Thank you  ?

## 2021-09-25 NOTE — Telephone Encounter (Signed)
Called patient to update on the transfer of care. No further questions or concerns at this time.  ?

## 2021-09-26 ENCOUNTER — Other Ambulatory Visit (INDEPENDENT_AMBULATORY_CARE_PROVIDER_SITE_OTHER): Payer: Medicare Other

## 2021-09-26 ENCOUNTER — Ambulatory Visit (INDEPENDENT_AMBULATORY_CARE_PROVIDER_SITE_OTHER): Payer: Medicare Other | Admitting: Physician Assistant

## 2021-09-26 ENCOUNTER — Encounter: Payer: Self-pay | Admitting: Physician Assistant

## 2021-09-26 VITALS — BP 138/88 | HR 82 | Ht 66.0 in | Wt 206.0 lb

## 2021-09-26 DIAGNOSIS — K921 Melena: Secondary | ICD-10-CM | POA: Diagnosis not present

## 2021-09-26 DIAGNOSIS — Z7901 Long term (current) use of anticoagulants: Secondary | ICD-10-CM

## 2021-09-26 LAB — CBC WITH DIFFERENTIAL/PLATELET
Basophils Absolute: 0.1 10*3/uL (ref 0.0–0.1)
Basophils Relative: 1.5 % (ref 0.0–3.0)
Eosinophils Absolute: 0.1 10*3/uL (ref 0.0–0.7)
Eosinophils Relative: 2.4 % (ref 0.0–5.0)
HCT: 39.5 % (ref 36.0–46.0)
Hemoglobin: 13.4 g/dL (ref 12.0–15.0)
Lymphocytes Relative: 30.9 % (ref 12.0–46.0)
Lymphs Abs: 1.5 10*3/uL (ref 0.7–4.0)
MCHC: 33.8 g/dL (ref 30.0–36.0)
MCV: 95.4 fl (ref 78.0–100.0)
Monocytes Absolute: 0.4 10*3/uL (ref 0.1–1.0)
Monocytes Relative: 7.9 % (ref 3.0–12.0)
Neutro Abs: 2.8 10*3/uL (ref 1.4–7.7)
Neutrophils Relative %: 57.3 % (ref 43.0–77.0)
Platelets: 302 10*3/uL (ref 150.0–400.0)
RBC: 4.14 Mil/uL (ref 3.87–5.11)
RDW: 13.7 % (ref 11.5–15.5)
WBC: 4.9 10*3/uL (ref 4.0–10.5)

## 2021-09-26 NOTE — Patient Instructions (Addendum)
If you are age 81 or older, your body mass index should be between 23-30. Your Body mass index is 33.25 kg/m?Marland Kitchen If this is out of the aforementioned range listed, please consider follow up with your Primary Care Provider. ?________________________________________________________ ? ?The [facility] providers would like to encourage you to use [facility] to communicate with providers for non-urgent requests or questions.  Due to long hold times on the telephone, sending your provider a message by [facility] may be a faster and more efficient way to get a response.  Please allow 48 business hours for a response.  Please remember that this is for non-urgent requests.  ?_______________________________________________________ ? ?Your provider has requested that you go to the basement level for lab work before leaving today. Press "B" on the elevator. The lab is located at the first door on the left as you exit the elevator. ? ?Continue Nexium ? ?Call the office and speak with Amy's nurse for any reoccurrence of black stools. ? ?Follow up as needed. ? ?Thank you for entrusting me with your care and choosing [facility]. ? ?Nicoletta Ba, PA-C  ?

## 2021-09-26 NOTE — Progress Notes (Signed)
? ?Subjective:  ? ? Patient ID: Felicia Cruz, female    DOB: 1940/08/30, 81 y.o.   MRN: [id number] ? ?HPI ?Felicia Cruz is a pleasant 81 year old white female, established with Dr. Bryan Cruz, who was last seen in our office in 2020 when she had undergone EGD and colonoscopy.  She is referred back today by Dr. Cristie Cruz for evaluation of a recent episode of black stool.  Patient says she is never had any previous episodes of black stool.  She had onset on 09/15/2021 and says that she had 1 very black bowel movement each day for 4 to 5 days and then this stopped.  She has not had a bowel movement now over the past 4 days she says is not unusual for her.  She has not had any associated abdominal pain or cramping though she says her lower abdomen has been feeling somewhat bloated "" over the past couple of weeks.  Appetite has been okay, no nausea or vomiting, no heartburn or indigestion, no dysphagia.  She had not started any new medications, she is on chronic Eliquis.  She had otherwise felt fine during this period of time. ?No Pepto-Bismol use, no changes in her diet.  She was seen at her PCP office on 09/21/2020 and had labs done showing hemoglobin 13.1/hematocrit 39.1/MCV 91.8, B12 1500 ?She last had iron studies in 2019 where which were normal. ?Prior EGD August 2020 showed a loose Nissen wrap with a 3 cm hiatal hernia, negative small bowel biopsies and no evidence for H. pylori on gastric biopsies.  At colonoscopy done at that same setting she had 2 small tubular adenomatous colon polyps and otherwise normal exam and was indicated for 7-year interval follow-up.  She also had random biopsies done of the colon which were unremarkable. ? 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 ? ?Review of  Systems.Pertinent positive and negative review of systems were noted in the above HPI section.  All other review of systems was otherwise negative.  ? ?Outpatient Encounter Medications as of 09/26/2021  ?Medication Sig  ? apixaban (ELIQUIS) 5 MG TABS tablet TAKE 1 TABLET(5 MG) BY MOUTH TWICE DAILY  ? cholecalciferol (VITAMIN D3) 25 MCG (1000 UNIT) tablet Take 1,000 Units by mouth daily.  ? cyanocobalamin (,VITAMIN B-12,) 1000 MCG/ML injection INJECT 1 ML INTO THE MUSCLE ONCE A MONTH  ? diphenhydramine-acetaminophen (TYLENOL PM) 25-500 MG TABS tablet Take 1 tablet by mouth at bedtime as needed.  ? esomeprazole (NEXIUM) 20 MG capsule TAKE 1 CAPSULE BY MOUTH 2 TIMES DAILY BEFORE A MEAL.  ? furosemide (LASIX) 20 MG tablet Take 1 tablet (20 mg total) by mouth daily as needed.  ? losartan-hydrochlorothiazide (HYZAAR) 100-12.5 MG tablet Take 1 tablet by mouth every morning.  ? Melatonin 1 MG CAPS Take by mouth.  ? MYRBETRIQ 50 MG TB24 tablet TAKE 1 TABLET BY MOUTH DAILY  ? traMADol (ULTRAM) 50 MG tablet Take 1 tablet (50 mg total) by mouth every 12 (twelve) hours as needed.  ? [DISCONTINUED] rOPINIRole (REQUIP) 1 MG tablet TAKE 1 TABLET BY MOUTH EVERY NIGHT AT BEDTIME  ? ?No facility-administered encounter medications on file as of 09/26/2021.  ? ?Allergies  ?Allergen Reactions  ? Atorvastatin Other (See Comments)  ?  Memory loss, confusion, hallucinations ?Amnesia  ? Gabapentin Other (See  Comments)  ?  Chest pain  ? Lyrica [Pregabalin] Other (See Comments)  ?  Water blisters on face  ? Penicillins Hives, Shortness Of Breath, Itching and Anaphylaxis  ? Acyclovir And Related   ? Famciclovir Other (See Comments)  ? Latex Other (See Comments) and Rash  ?  Skin peeling (thumb)  ? Methocarbamol Other (See Comments)  ?  Blisters on face  ? ?Patient Active Problem List  ? Diagnosis Date Noted  ? Ingrown toenail 12/28/2017  ? Iron deficiency anemia due to chronic blood loss 06/23/2017  ? B12 deficiency 03/04/2016  ? Essential  hypertension, benign 07/04/2014  ? Pulmonary embolism ([place]) 10/14/2013  ? PE (pulmonary embolism) 10/14/2013  ? Urinary incontinence 04/07/2013  ? Right gluteus tear 12/28/2012  ? Generalized anxiety disorder 08/19/2012  ? GERD (gastroesophageal reflux disease) 08/19/2012  ? Adult BMI 32.0-32.9 kg/sq m 08/11/2012  ? Depression 09/10/2011  ? S/P Nissen fundoplication (without gastrostomy tube) procedure 07/26/2011  ? Chronic cough 01/09/2011  ? RESTLESS LEG SYNDROME 05/05/2008  ? Hyperlipidemia LDL goal <100 09/14/2007  ? Osteoporosis 09/14/2007  ? ?Social History  ? ?Socioeconomic History  ? Marital status: Widowed  ?  Spouse name: Not on file  ? Number of children: 0  ? Years of education: Not on file  ? Highest education level: Not on file  ?Occupational History  ? Occupation: Retired  ?Tobacco Use  ? Smoking status: Former  ?  Packs/day: 1.00  ?  Years: 10.00  ?  Pack years: 10.00  ?  Types: Cigarettes  ?  Quit date: 06/04/1971  ?  Years since quitting: 50.3  ? Smokeless tobacco: Never  ?Vaping Use  ? Vaping Use: Never used  ?Substance and Sexual Activity  ? Alcohol use: No  ?  Alcohol/week: 0.0 standard drinks  ?  Comment: RECOVERING ALCOLHOLIC  ? Drug use: No  ? Sexual activity: Not on file  ?Other Topics Concern  ? Not on file  ?Social History Narrative  ? Regular exercise-YES  ? ?Social Determinants of Health  ? ?Financial Resource Strain: Not on file  ?Food Insecurity: Not on file  ?Transportation Needs: Not on file  ?Physical Activity: Not on file  ?Stress: Not on file  ?Social Connections: Not on file  ?Intimate Partner Violence: Not on file  ? ? ?Felicia Cruz's family history includes Alzheimer's disease in her mother; Heart disease in her mother; Hip fracture in her mother; Hypertension in her mother; Stroke in her mother; Stroke (age of onset: 26) in her brother; Throat cancer in her father. ? ? ?   ?Objective:  ?  ?Vitals:  ? 09/26/21 1336  ?BP: 138/88  ?Pulse: 82  ? ? ?Physical Exam.Well-developed  well-nourished elderly white female in no acute distress.  Height, Weight, 206 BMI 33 point ? ?HEENT; nontraumatic normocephalic, EOMI, PE R LA, sclera anicteric, rhinophyma ?Oropharynx; not examined today ?Neck; supple, no JVD ?Cardiovascular; regular rate and rhythm with S1-S2, no murmur rub or gallop ?Pulmonary; Clear bilaterally ?Abdomen; soft, nontender, nondistended, no palpable mass or hepatosplenomegaly, bowel sounds are active, lower abdominal incisional scars ?Rectal; small external hemorrhoidal tag, brown stool in rectal vault heme-negative ?Skin; benign exam, no jaundice rash or appreciable lesions ?Extremities; no clubbing cyanosis or edema skin warm and dry ?Neuro/Psych; alert and oriented x4, grossly nonfocal mood and affect appropriate  ? ? ? ?   ?Assessment & Plan:  ? ?#14 81 year old white female with recent 4 to 5-day episode of dark to black stool, resolved  about 4 days ago and patient has not had a bowel movement since that time.  This occurred in the setting of chronic Eliquis use ?Hemoglobin on 09/21/2020 was 13.1, reassuring ?Stool is brown and Hemoccult negative today ? ?, Cannot rule out a self-limited episode of minor GI blood loss, however no evidence of any ongoing bleeding with heme-negative stool today ?Consider gastropathy, AVMs versus other. ? ?#2 chronic GERD-status post Nissen fundoplication 6389 with slipped Nissen, stable on Nexium ?#3 history of tubular adenomatous colon polyps-last colonoscopy August 2020-indicated for 7-year interval follow-up, however due to her advanced age, this will not be indicated. ?#4 history of recurrent DVT/PE-on chronic Eliquis ? ?Plan; continue Nexium 20 mg p.o. twice daily AC ?Repeat CBC today ?We discussed potential EGD, versus observation.  She would like to wait and see if she has any recurrence of symptoms.  She will pay close attention to her bowels, and is advised to call here if she has any recurrence of the black stools. ? ?Alfredia Ferguson  PA-C ?09/26/2021 ? ? ?Cc: Michael Boston, MD ?  ?

## 2021-09-27 NOTE — Progress Notes (Signed)
Agree with the assessment and plan as outlined by Amy Esterwood, PA-C.  Tuan Tippin, DO, FACG  

## 2021-10-05 ENCOUNTER — Ambulatory Visit: Payer: Medicare Other | Admitting: Nurse Practitioner

## 2021-10-10 ENCOUNTER — Telehealth: Payer: Self-pay | Admitting: Pharmacy Technician

## 2021-10-10 ENCOUNTER — Other Ambulatory Visit: Payer: Self-pay

## 2021-10-10 DIAGNOSIS — M810 Age-related osteoporosis without current pathological fracture: Secondary | ICD-10-CM | POA: Insufficient documentation

## 2021-10-10 NOTE — Telephone Encounter (Signed)
Auth Submission: no auth needed ?Payer: medicare A/B ?Medication & CPT/J Code(s) submitted: Reclast (Zolendronic acid) J3489 ?Route of submission (phone, fax, portal): PHONE [phone] ?Auth type: Buy/Bill ?Units/visits requested: X1 DOSE ?Reference number: [id number] ?Approval from: 10/10/21 to 06/02/22  ?Patient has met  $179.00 of $226 deductilble ?

## 2021-10-11 DIAGNOSIS — Z1231 Encounter for screening mammogram for malignant neoplasm of breast: Secondary | ICD-10-CM | POA: Diagnosis not present

## 2021-10-15 ENCOUNTER — Ambulatory Visit: Payer: Medicare Other

## 2021-10-30 DIAGNOSIS — R928 Other abnormal and inconclusive findings on diagnostic imaging of breast: Secondary | ICD-10-CM | POA: Diagnosis not present

## 2021-10-30 DIAGNOSIS — R922 Inconclusive mammogram: Secondary | ICD-10-CM | POA: Diagnosis not present

## 2021-10-31 ENCOUNTER — Ambulatory Visit (INDEPENDENT_AMBULATORY_CARE_PROVIDER_SITE_OTHER): Payer: Medicare Other

## 2021-10-31 VITALS — BP 143/85 | HR 79 | Temp 98.5°F | Resp 18 | Ht 65.0 in | Wt 207.0 lb

## 2021-10-31 DIAGNOSIS — M810 Age-related osteoporosis without current pathological fracture: Secondary | ICD-10-CM | POA: Diagnosis not present

## 2021-10-31 MED ORDER — SODIUM CHLORIDE 0.9 % IV SOLN: INTRAVENOUS | Status: DC

## 2021-10-31 MED ORDER — ZOLEDRONIC ACID 5 MG/100ML IV SOLN
5.0000 mg | Freq: Once | INTRAVENOUS | Status: AC
  Administered 2021-10-31: 5 mg via INTRAVENOUS
  Filled 2021-10-31: qty 100

## 2021-10-31 MED ORDER — ACETAMINOPHEN 325 MG PO TABS: 650.0000 mg | ORAL_TABLET | Freq: Once | ORAL | Status: DC

## 2021-10-31 MED ORDER — DIPHENHYDRAMINE HCL 25 MG PO CAPS: 25.0000 mg | ORAL_CAPSULE | Freq: Once | ORAL | Status: DC

## 2021-10-31 NOTE — Progress Notes (Signed)
Diagnosis: osteoporosis   Provider:  Marshell Garfinkel, MD  Procedure: Infusion  IV Type: Peripheral, IV Location: R Antecubital  Reclast (Zolendronic Acid), Dose: 5 mg  Infusion Start Time: 1106  Infusion Stop Time: 5053  Post Infusion IV Care: Observation period completed and Peripheral IV Discontinued  Discharge: Condition: Good, Destination: Home . AVS provided to patient.   Performed by:  Adelina Mings, LPN

## 2021-10-31 NOTE — Patient Instructions (Signed)

## 2021-12-25 DIAGNOSIS — H43812 Vitreous degeneration, left eye: Secondary | ICD-10-CM | POA: Diagnosis not present

## 2021-12-25 DIAGNOSIS — Z961 Presence of intraocular lens: Secondary | ICD-10-CM | POA: Diagnosis not present

## 2022-03-28 DIAGNOSIS — L565 Disseminated superficial actinic porokeratosis (DSAP): Secondary | ICD-10-CM | POA: Diagnosis not present

## 2022-03-28 DIAGNOSIS — L821 Other seborrheic keratosis: Secondary | ICD-10-CM | POA: Diagnosis not present

## 2022-03-28 DIAGNOSIS — D485 Neoplasm of uncertain behavior of skin: Secondary | ICD-10-CM | POA: Diagnosis not present

## 2022-03-28 DIAGNOSIS — L570 Actinic keratosis: Secondary | ICD-10-CM | POA: Diagnosis not present

## 2022-03-28 DIAGNOSIS — D225 Melanocytic nevi of trunk: Secondary | ICD-10-CM | POA: Diagnosis not present

## 2022-03-28 DIAGNOSIS — Z85828 Personal history of other malignant neoplasm of skin: Secondary | ICD-10-CM | POA: Diagnosis not present

## 2022-03-28 DIAGNOSIS — D0471 Carcinoma in situ of skin of right lower limb, including hip: Secondary | ICD-10-CM | POA: Diagnosis not present

## 2022-05-01 DIAGNOSIS — R351 Nocturia: Secondary | ICD-10-CM | POA: Diagnosis not present

## 2022-05-01 DIAGNOSIS — Z86718 Personal history of other venous thrombosis and embolism: Secondary | ICD-10-CM | POA: Diagnosis not present

## 2022-05-01 DIAGNOSIS — N1831 Chronic kidney disease, stage 3a: Secondary | ICD-10-CM | POA: Diagnosis not present

## 2022-05-01 DIAGNOSIS — N3281 Overactive bladder: Secondary | ICD-10-CM | POA: Diagnosis not present

## 2022-05-01 DIAGNOSIS — I129 Hypertensive chronic kidney disease with stage 1 through stage 4 chronic kidney disease, or unspecified chronic kidney disease: Secondary | ICD-10-CM | POA: Diagnosis not present

## 2022-06-06 DIAGNOSIS — M810 Age-related osteoporosis without current pathological fracture: Secondary | ICD-10-CM | POA: Diagnosis not present

## 2022-06-06 DIAGNOSIS — N1831 Chronic kidney disease, stage 3a: Secondary | ICD-10-CM | POA: Diagnosis not present

## 2022-07-31 DIAGNOSIS — Z85828 Personal history of other malignant neoplasm of skin: Secondary | ICD-10-CM | POA: Diagnosis not present

## 2022-07-31 DIAGNOSIS — D485 Neoplasm of uncertain behavior of skin: Secondary | ICD-10-CM | POA: Diagnosis not present

## 2022-07-31 DIAGNOSIS — C44729 Squamous cell carcinoma of skin of left lower limb, including hip: Secondary | ICD-10-CM | POA: Diagnosis not present

## 2022-07-31 DIAGNOSIS — D0471 Carcinoma in situ of skin of right lower limb, including hip: Secondary | ICD-10-CM | POA: Diagnosis not present

## 2022-09-11 DIAGNOSIS — L820 Inflamed seborrheic keratosis: Secondary | ICD-10-CM | POA: Diagnosis not present

## 2022-09-11 DIAGNOSIS — L905 Scar conditions and fibrosis of skin: Secondary | ICD-10-CM | POA: Diagnosis not present

## 2022-09-11 DIAGNOSIS — Z85828 Personal history of other malignant neoplasm of skin: Secondary | ICD-10-CM | POA: Diagnosis not present

## 2022-09-11 DIAGNOSIS — L280 Lichen simplex chronicus: Secondary | ICD-10-CM | POA: Diagnosis not present

## 2022-09-11 DIAGNOSIS — B078 Other viral warts: Secondary | ICD-10-CM | POA: Diagnosis not present

## 2022-09-11 DIAGNOSIS — D485 Neoplasm of uncertain behavior of skin: Secondary | ICD-10-CM | POA: Diagnosis not present

## 2022-09-11 DIAGNOSIS — L570 Actinic keratosis: Secondary | ICD-10-CM | POA: Diagnosis not present

## 2022-10-03 DIAGNOSIS — M810 Age-related osteoporosis without current pathological fracture: Secondary | ICD-10-CM | POA: Diagnosis not present

## 2022-10-03 DIAGNOSIS — E785 Hyperlipidemia, unspecified: Secondary | ICD-10-CM | POA: Diagnosis not present

## 2022-10-03 DIAGNOSIS — R7989 Other specified abnormal findings of blood chemistry: Secondary | ICD-10-CM | POA: Diagnosis not present

## 2022-10-03 DIAGNOSIS — K219 Gastro-esophageal reflux disease without esophagitis: Secondary | ICD-10-CM | POA: Diagnosis not present

## 2022-10-03 DIAGNOSIS — E538 Deficiency of other specified B group vitamins: Secondary | ICD-10-CM | POA: Diagnosis not present

## 2022-10-03 DIAGNOSIS — I10 Essential (primary) hypertension: Secondary | ICD-10-CM | POA: Diagnosis not present

## 2022-10-10 DIAGNOSIS — Z0000 Encounter for general adult medical examination without abnormal findings: Secondary | ICD-10-CM | POA: Diagnosis not present

## 2022-10-10 DIAGNOSIS — R7301 Impaired fasting glucose: Secondary | ICD-10-CM | POA: Diagnosis not present

## 2022-10-10 DIAGNOSIS — Z86718 Personal history of other venous thrombosis and embolism: Secondary | ICD-10-CM | POA: Diagnosis not present

## 2022-10-10 DIAGNOSIS — E785 Hyperlipidemia, unspecified: Secondary | ICD-10-CM | POA: Diagnosis not present

## 2022-10-10 DIAGNOSIS — I129 Hypertensive chronic kidney disease with stage 1 through stage 4 chronic kidney disease, or unspecified chronic kidney disease: Secondary | ICD-10-CM | POA: Diagnosis not present

## 2022-10-10 DIAGNOSIS — E538 Deficiency of other specified B group vitamins: Secondary | ICD-10-CM | POA: Diagnosis not present

## 2022-10-10 DIAGNOSIS — Z6835 Body mass index (BMI) 35.0-35.9, adult: Secondary | ICD-10-CM | POA: Diagnosis not present

## 2022-10-10 DIAGNOSIS — N1831 Chronic kidney disease, stage 3a: Secondary | ICD-10-CM | POA: Diagnosis not present

## 2022-10-10 DIAGNOSIS — Z1339 Encounter for screening examination for other mental health and behavioral disorders: Secondary | ICD-10-CM | POA: Diagnosis not present

## 2022-10-10 DIAGNOSIS — Z1331 Encounter for screening for depression: Secondary | ICD-10-CM | POA: Diagnosis not present

## 2022-10-10 DIAGNOSIS — M545 Low back pain, unspecified: Secondary | ICD-10-CM | POA: Diagnosis not present

## 2022-10-10 DIAGNOSIS — M810 Age-related osteoporosis without current pathological fracture: Secondary | ICD-10-CM | POA: Diagnosis not present

## 2022-10-10 DIAGNOSIS — N3281 Overactive bladder: Secondary | ICD-10-CM | POA: Diagnosis not present

## 2022-11-12 DIAGNOSIS — M810 Age-related osteoporosis without current pathological fracture: Secondary | ICD-10-CM | POA: Diagnosis not present

## 2022-11-20 ENCOUNTER — Other Ambulatory Visit: Payer: Self-pay

## 2022-11-28 ENCOUNTER — Telehealth: Payer: Self-pay | Admitting: Pharmacy Technician

## 2022-11-28 NOTE — Telephone Encounter (Signed)
Auth Submission: NO AUTH NEEDED Site of care: Site of care: [facility] Payer: MEDICARE A/B & AARP Medication & CPT/J Code(s) submitted: Reclast (Zolendronic acid) [id number] Route of submission (phone, fax, portal):  Phone # Fax # Auth type: Buy/Bill Units/visits requested: X1 Reference number:  Approval from: 11/28/22 to 11/28/23

## 2022-12-09 DIAGNOSIS — Z1231 Encounter for screening mammogram for malignant neoplasm of breast: Secondary | ICD-10-CM | POA: Diagnosis not present

## 2023-01-13 ENCOUNTER — Ambulatory Visit (INDEPENDENT_AMBULATORY_CARE_PROVIDER_SITE_OTHER): Payer: Medicare Other

## 2023-01-13 VITALS — BP 155/73 | HR 91 | Temp 98.0°F | Resp 19 | Ht 65.0 in | Wt 207.6 lb

## 2023-01-13 DIAGNOSIS — M810 Age-related osteoporosis without current pathological fracture: Secondary | ICD-10-CM

## 2023-01-13 MED ORDER — SODIUM CHLORIDE 0.9 % IV SOLN: INTRAVENOUS | Status: DC

## 2023-01-13 MED ORDER — DIPHENHYDRAMINE HCL 25 MG PO CAPS
25.0000 mg | ORAL_CAPSULE | Freq: Once | ORAL | Status: AC
  Administered 2023-01-13: 25 mg via ORAL
  Filled 2023-01-13: qty 1

## 2023-01-13 MED ORDER — ACETAMINOPHEN 325 MG PO TABS
650.0000 mg | ORAL_TABLET | Freq: Once | ORAL | Status: AC
  Administered 2023-01-13: 650 mg via ORAL
  Filled 2023-01-13: qty 2

## 2023-01-13 MED ORDER — ZOLEDRONIC ACID 5 MG/100ML IV SOLN
5.0000 mg | Freq: Once | INTRAVENOUS | Status: AC
  Administered 2023-01-13: 5 mg via INTRAVENOUS
  Filled 2023-01-13: qty 100

## 2023-01-13 NOTE — Progress Notes (Signed)
Diagnosis: Osteoporosis  Provider:  Chilton Greathouse MD  Procedure: IV Infusion  IV Type: Peripheral, IV Location: R Antecubital  Reclast (Zolendronic Acid), Dose: 5 mg  Infusion Start Time: 1328  Infusion Stop Time: 1358  Post Infusion IV Care: Observation period completed. PIV removed.  Discharge: Condition: Good, Destination: Home . AVS Declined  Performed by:  Rico Ala, LPN

## 2023-01-14 DIAGNOSIS — M545 Low back pain, unspecified: Secondary | ICD-10-CM | POA: Diagnosis not present

## 2023-01-14 DIAGNOSIS — Z6835 Body mass index (BMI) 35.0-35.9, adult: Secondary | ICD-10-CM | POA: Diagnosis not present

## 2023-03-06 DIAGNOSIS — Z961 Presence of intraocular lens: Secondary | ICD-10-CM | POA: Diagnosis not present

## 2023-03-27 DIAGNOSIS — L400 Psoriasis vulgaris: Secondary | ICD-10-CM | POA: Diagnosis not present

## 2023-03-27 DIAGNOSIS — D225 Melanocytic nevi of trunk: Secondary | ICD-10-CM | POA: Diagnosis not present

## 2023-03-27 DIAGNOSIS — L821 Other seborrheic keratosis: Secondary | ICD-10-CM | POA: Diagnosis not present

## 2023-03-27 DIAGNOSIS — D485 Neoplasm of uncertain behavior of skin: Secondary | ICD-10-CM | POA: Diagnosis not present

## 2023-03-27 DIAGNOSIS — L820 Inflamed seborrheic keratosis: Secondary | ICD-10-CM | POA: Diagnosis not present

## 2023-03-27 DIAGNOSIS — Z85828 Personal history of other malignant neoplasm of skin: Secondary | ICD-10-CM | POA: Diagnosis not present

## 2023-03-27 DIAGNOSIS — L570 Actinic keratosis: Secondary | ICD-10-CM | POA: Diagnosis not present

## 2023-04-07 ENCOUNTER — Other Ambulatory Visit (HOSPITAL_COMMUNITY): Payer: Self-pay

## 2023-04-07 ENCOUNTER — Encounter: Payer: Self-pay | Admitting: Hematology & Oncology

## 2023-04-07 MED ORDER — ZEPBOUND 10 MG/0.5ML ~~LOC~~ SOAJ
10.0000 mg | SUBCUTANEOUS | 0 refills | Status: AC
  Filled 2023-04-07: qty 2, 28d supply, fill #0

## 2023-04-10 DIAGNOSIS — M810 Age-related osteoporosis without current pathological fracture: Secondary | ICD-10-CM | POA: Diagnosis not present

## 2023-04-10 DIAGNOSIS — Z6830 Body mass index (BMI) 30.0-30.9, adult: Secondary | ICD-10-CM | POA: Diagnosis not present

## 2023-04-10 DIAGNOSIS — Z5181 Encounter for therapeutic drug level monitoring: Secondary | ICD-10-CM | POA: Diagnosis not present

## 2023-04-10 DIAGNOSIS — Z86718 Personal history of other venous thrombosis and embolism: Secondary | ICD-10-CM | POA: Diagnosis not present

## 2023-04-10 DIAGNOSIS — Z23 Encounter for immunization: Secondary | ICD-10-CM | POA: Diagnosis not present

## 2023-04-10 DIAGNOSIS — M545 Low back pain, unspecified: Secondary | ICD-10-CM | POA: Diagnosis not present

## 2023-04-10 DIAGNOSIS — N1831 Chronic kidney disease, stage 3a: Secondary | ICD-10-CM | POA: Diagnosis not present

## 2023-04-10 DIAGNOSIS — E669 Obesity, unspecified: Secondary | ICD-10-CM | POA: Diagnosis not present

## 2023-04-10 DIAGNOSIS — I129 Hypertensive chronic kidney disease with stage 1 through stage 4 chronic kidney disease, or unspecified chronic kidney disease: Secondary | ICD-10-CM | POA: Diagnosis not present

## 2023-05-06 ENCOUNTER — Encounter: Payer: Self-pay | Admitting: Hematology & Oncology

## 2023-05-06 ENCOUNTER — Other Ambulatory Visit (HOSPITAL_COMMUNITY): Payer: Self-pay

## 2023-05-06 MED ORDER — TRAMADOL HCL 50 MG PO TABS
25.0000 mg | ORAL_TABLET | Freq: Every day | ORAL | 2 refills | Status: AC | PRN
  Filled 2023-05-06: qty 30, 30d supply, fill #0

## 2023-05-06 MED ORDER — MIRABEGRON ER 50 MG PO TB24
50.0000 mg | ORAL_TABLET | Freq: Every day | ORAL | 2 refills | Status: AC
  Filled 2023-05-06: qty 30, 30d supply, fill #0

## 2023-05-06 MED ORDER — ZEPBOUND 12.5 MG/0.5ML ~~LOC~~ SOAJ
12.5000 mg | SUBCUTANEOUS | 0 refills | Status: AC
  Filled 2023-05-06 (×2): qty 2, 28d supply, fill #0

## 2023-08-21 DIAGNOSIS — I129 Hypertensive chronic kidney disease with stage 1 through stage 4 chronic kidney disease, or unspecified chronic kidney disease: Secondary | ICD-10-CM | POA: Diagnosis not present

## 2023-08-21 DIAGNOSIS — M810 Age-related osteoporosis without current pathological fracture: Secondary | ICD-10-CM | POA: Diagnosis not present

## 2023-08-21 DIAGNOSIS — Z6828 Body mass index (BMI) 28.0-28.9, adult: Secondary | ICD-10-CM | POA: Diagnosis not present

## 2023-08-21 DIAGNOSIS — Z86718 Personal history of other venous thrombosis and embolism: Secondary | ICD-10-CM | POA: Diagnosis not present

## 2023-08-21 DIAGNOSIS — M545 Low back pain, unspecified: Secondary | ICD-10-CM | POA: Diagnosis not present

## 2023-08-21 DIAGNOSIS — N1831 Chronic kidney disease, stage 3a: Secondary | ICD-10-CM | POA: Diagnosis not present

## 2023-10-01 DIAGNOSIS — E785 Hyperlipidemia, unspecified: Secondary | ICD-10-CM | POA: Diagnosis not present

## 2023-10-01 DIAGNOSIS — E538 Deficiency of other specified B group vitamins: Secondary | ICD-10-CM | POA: Diagnosis not present

## 2023-10-07 DIAGNOSIS — N1831 Chronic kidney disease, stage 3a: Secondary | ICD-10-CM | POA: Diagnosis not present

## 2023-10-07 DIAGNOSIS — E785 Hyperlipidemia, unspecified: Secondary | ICD-10-CM | POA: Diagnosis not present

## 2023-10-07 DIAGNOSIS — M810 Age-related osteoporosis without current pathological fracture: Secondary | ICD-10-CM | POA: Diagnosis not present

## 2023-10-07 DIAGNOSIS — I129 Hypertensive chronic kidney disease with stage 1 through stage 4 chronic kidney disease, or unspecified chronic kidney disease: Secondary | ICD-10-CM | POA: Diagnosis not present

## 2023-10-14 DIAGNOSIS — I129 Hypertensive chronic kidney disease with stage 1 through stage 4 chronic kidney disease, or unspecified chronic kidney disease: Secondary | ICD-10-CM | POA: Diagnosis not present

## 2023-10-14 DIAGNOSIS — M545 Low back pain, unspecified: Secondary | ICD-10-CM | POA: Diagnosis not present

## 2023-10-14 DIAGNOSIS — Z0000 Encounter for general adult medical examination without abnormal findings: Secondary | ICD-10-CM | POA: Diagnosis not present

## 2023-10-14 DIAGNOSIS — Z1339 Encounter for screening examination for other mental health and behavioral disorders: Secondary | ICD-10-CM | POA: Diagnosis not present

## 2023-10-14 DIAGNOSIS — Z6828 Body mass index (BMI) 28.0-28.9, adult: Secondary | ICD-10-CM | POA: Diagnosis not present

## 2023-10-14 DIAGNOSIS — N1831 Chronic kidney disease, stage 3a: Secondary | ICD-10-CM | POA: Diagnosis not present

## 2023-10-14 DIAGNOSIS — Z86718 Personal history of other venous thrombosis and embolism: Secondary | ICD-10-CM | POA: Diagnosis not present

## 2023-10-14 DIAGNOSIS — E785 Hyperlipidemia, unspecified: Secondary | ICD-10-CM | POA: Diagnosis not present

## 2023-10-14 DIAGNOSIS — Z1331 Encounter for screening for depression: Secondary | ICD-10-CM | POA: Diagnosis not present

## 2023-10-14 DIAGNOSIS — M25512 Pain in left shoulder: Secondary | ICD-10-CM | POA: Diagnosis not present

## 2023-10-14 DIAGNOSIS — M810 Age-related osteoporosis without current pathological fracture: Secondary | ICD-10-CM | POA: Diagnosis not present

## 2023-10-14 DIAGNOSIS — E663 Overweight: Secondary | ICD-10-CM | POA: Diagnosis not present

## 2023-10-14 DIAGNOSIS — N3281 Overactive bladder: Secondary | ICD-10-CM | POA: Diagnosis not present

## 2024-01-12 ENCOUNTER — Encounter: Payer: Self-pay | Admitting: Hematology & Oncology

## 2024-01-21 DIAGNOSIS — M8589 Other specified disorders of bone density and structure, multiple sites: Secondary | ICD-10-CM | POA: Diagnosis not present

## 2024-01-21 DIAGNOSIS — Z1231 Encounter for screening mammogram for malignant neoplasm of breast: Secondary | ICD-10-CM | POA: Diagnosis not present

## 2024-01-22 DIAGNOSIS — M810 Age-related osteoporosis without current pathological fracture: Secondary | ICD-10-CM | POA: Diagnosis not present

## 2024-01-22 DIAGNOSIS — I129 Hypertensive chronic kidney disease with stage 1 through stage 4 chronic kidney disease, or unspecified chronic kidney disease: Secondary | ICD-10-CM | POA: Diagnosis not present

## 2024-01-22 DIAGNOSIS — K219 Gastro-esophageal reflux disease without esophagitis: Secondary | ICD-10-CM | POA: Diagnosis not present

## 2024-01-23 ENCOUNTER — Other Ambulatory Visit: Payer: Self-pay | Admitting: Nurse Practitioner

## 2024-01-23 ENCOUNTER — Telehealth (HOSPITAL_COMMUNITY): Payer: Self-pay

## 2024-01-23 ENCOUNTER — Telehealth: Payer: Self-pay

## 2024-01-23 NOTE — Telephone Encounter (Signed)
 Auth Submission: NO AUTH NEEDED Site of care: Site of care: [facility] Payer: Medicare A/B with AARP supplement Medication & CPT/J Code(s) submitted: Reclast  (Zolendronic acid) J3489 Diagnosis Code:  Route of submission (phone, fax, portal):  Phone # Fax # Auth type: Buy/Bill PB Units/visits requested: 5mg  x 1 dose Reference number:  Approval from: 01/23/24 to 06/02/24

## 2024-01-23 NOTE — Telephone Encounter (Signed)
 Auth Submission: NO AUTH NEEDED Site of care: Site of care: [facility] Payer: Medicare A/B, AARP Supplement Medication & CPT/J Code(s) submitted: Reclast  (Zolendronic acid) J3489 Diagnosis Code: M81.0 Route of submission (phone, fax, portal):  Phone # Fax # Auth type: Buy/Bill HB Units/visits requested: 5mg  x 1 dose Reference number:  Approval from: 01/23/24 to 07/03/24

## 2024-02-10 ENCOUNTER — Encounter: Payer: Self-pay | Admitting: Podiatry

## 2024-02-10 ENCOUNTER — Ambulatory Visit (INDEPENDENT_AMBULATORY_CARE_PROVIDER_SITE_OTHER): Admitting: Podiatry

## 2024-02-10 DIAGNOSIS — M109 Gout, unspecified: Secondary | ICD-10-CM

## 2024-02-11 ENCOUNTER — Ambulatory Visit (INDEPENDENT_AMBULATORY_CARE_PROVIDER_SITE_OTHER)

## 2024-02-11 VITALS — BP 121/71 | HR 52 | Temp 98.0°F | Resp 16 | Ht 65.0 in | Wt 177.4 lb

## 2024-02-11 DIAGNOSIS — M810 Age-related osteoporosis without current pathological fracture: Secondary | ICD-10-CM | POA: Diagnosis not present

## 2024-02-11 MED ORDER — DIPHENHYDRAMINE HCL 25 MG PO CAPS: 25.0000 mg | ORAL_CAPSULE | Freq: Once | ORAL | Status: DC

## 2024-02-11 MED ORDER — ACETAMINOPHEN 325 MG PO TABS: 650.0000 mg | ORAL_TABLET | Freq: Once | ORAL | Status: DC

## 2024-02-11 MED ORDER — ZOLEDRONIC ACID 5 MG/100ML IV SOLN
5.0000 mg | Freq: Once | INTRAVENOUS | Status: AC
  Administered 2024-02-11: 5 mg via INTRAVENOUS
  Filled 2024-02-11: qty 100

## 2024-02-11 NOTE — Progress Notes (Signed)
 Diagnosis: Osteoporosis  Provider:  Lonna Coder MD  Procedure: IV Infusion  IV Type: Peripheral, IV Location: R Antecubital  Reclast  (Zolendronic Acid), Dose: 5 mg  Infusion Start Time: 1329  Infusion Stop Time: 1400  Post Infusion IV Care: Observation period completed and Peripheral IV Discontinued  Discharge: Condition: Good, Destination: Home . AVS Declined  Performed by:  Maximiano JONELLE Pouch, LPN

## 2024-02-11 NOTE — Progress Notes (Signed)
 Subjective:  Patient ID: Felicia Cruz, female    DOB: 07-02-1940,  MRN: [id number] HPI Chief Complaint  Patient presents with   Foot Pain    Lateral dorsal foot left - about a month ago there is a knot that was very red and swollen and sore, better today, barely noticeable, but still wanted it checked out.   New Patient (Initial Visit)    Est pt 2019  She states that she woke up with this pain 1 morning it lasted for about a week.  The area was red hot swollen and painful so painful she could hardly keep a sock or shoe on.  83 y.o. female presents with the above complaint.   ROS: Denies fever chills nausea joint muscle aches pains calf pain back pain chest pain shortness of breath.  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    RLS (restless legs syndrome)    Substance abuse (HCC)    RECOVERING ALCOLHOLIC   Past Surgical History:  Procedure Laterality Date   ABDOMINOPLASTY     DILATION AND CURETTAGE OF UTERUS  many yrs ago   x 2   ESOPHAGEAL MANOMETRY  02/17/2012   Procedure: ESOPHAGEAL MANOMETRY (EM);  Surgeon: Donnice KATHEE Lunger, MD;  Location: [facility];  Service: Endoscopy;  Laterality: N/A;   FACIAL COSMETIC SURGERY     eyes, nose   JOINT REPLACEMENT     left   LAPAROSCOPIC NISSEN FUNDOPLICATION  06/26/2011   Procedure: LAPAROSCOPIC NISSEN FUNDOPLICATION;  Surgeon: Donnice KATHEE Lunger, MD;  Location: [facility];  Service: General;  Laterality: N/A;  Laparoscopic Nissen repair of hiatal hernia  with lighted bougie   LIPOSUCTION  15 to 20 yrs ago    LUMBAR DISC SURGERY  2008   OPEN SURGICAL REPAIR OF GLUTEAL TENDON Right 12/28/2012   Procedure: RIGHT GLUTEAL TENDON REPAIR;  Surgeon: Donnice JONETTA Car, MD;  Location: [facility];  Service: Orthopedics;  Laterality: Right;   RIGHT INDEX FINGER SURGERY  06/19/11   AT [facility]   SURGERY FOR KIDNEY STONES     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   Procedure: TOTAL KNEE ARTHROPLASTY;  Surgeon: Donnice JONETTA Car, MD;  Location: [facility];  Service: Orthopedics;  Laterality: Left;    Current Outpatient Medications:    ezetimibe (ZETIA) 10 MG tablet, Take 10 mg by mouth daily., Disp: , Rfl:    hydrochlorothiazide  (MICROZIDE ) 12.5 MG capsule, Take 12.5 mg by mouth every morning., Disp: , Rfl:    losartan  (COZAAR ) 100 MG tablet, Take 100 mg by mouth daily., Disp: , Rfl:    apixaban  (ELIQUIS ) 5 MG TABS tablet, TAKE 1 TABLET(5 MG) BY MOUTH TWICE DAILY, Disp: 180 tablet, Rfl: 3   cholecalciferol (VITAMIN D3) 25 MCG (1000 UNIT) tablet, Take 1,000 Units by mouth daily., Disp: , Rfl:    cyanocobalamin  (,VITAMIN B-12,) 1000 MCG/ML injection, INJECT 1 ML INTO THE MUSCLE ONCE A MONTH, Disp: ,  Rfl: 0   diphenhydramine -acetaminophen  (TYLENOL  PM) 25-500 MG TABS tablet, Take 1 tablet by mouth at bedtime as needed., Disp: , Rfl:    esomeprazole  (NEXIUM ) 20 MG capsule, TAKE 1 CAPSULE BY MOUTH 2 TIMES DAILY BEFORE A MEAL., Disp: 180 capsule, Rfl: 5   furosemide  (LASIX ) 20 MG tablet, Take 1 tablet (20 mg total) by mouth daily as needed., Disp: 30 tablet, Rfl: 2   losartan -hydrochlorothiazide  (HYZAAR) 100-12.5 MG tablet, Take 1 tablet by mouth every morning., Disp: 90 tablet, Rfl: 0   Melatonin 1 MG CAPS, Take by mouth., Disp: , Rfl:    mirabegron  ER (MYRBETRIQ ) 50 MG TB24 tablet, Take 1 tablet (50 mg total) by mouth daily., Disp: 90 tablet, Rfl: 2   MYRBETRIQ  50 MG TB24 tablet, TAKE 1 TABLET BY MOUTH DAILY, Disp: 90 tablet, Rfl: 3   tirzepatide  (ZEPBOUND ) 10 MG/0.5ML Pen, Inject 10 mg into the skin once a  week., Disp: 2 mL, Rfl: 0   tirzepatide  (ZEPBOUND ) 12.5 MG/0.5ML Pen, Inject 12.5 mg into the skin once a week., Disp: 2 mL, Rfl: 0   traMADol  (ULTRAM ) 50 MG tablet, Take 1 tablet (50 mg total) by mouth every 12 (twelve) hours as needed., Disp: 60 tablet, Rfl: 0   traMADol  (ULTRAM ) 50 MG tablet, Take 1/2-1 tablet (25-50 mg total) by mouth once daily as needed., Disp: 30 tablet, Rfl: 2  Allergies  Allergen Reactions   Atorvastatin Other (See Comments)    Memory loss, confusion, hallucinations Amnesia   Gabapentin Other (See Comments)    Chest pain   Lyrica  [Pregabalin ] Other (See Comments)    Water blisters on face   Penicillins Hives, Shortness Of Breath, Itching and Anaphylaxis   Acyclovir And Related    Famciclovir Other (See Comments)   Latex Other (See Comments) and Rash    Skin peeling (thumb)   Methocarbamol  Other (See Comments)    Blisters on face   Review of Systems Objective:  There were no vitals filed for this visit.  General: Well developed, nourished, in no acute distress, alert and oriented x3   Dermatological: Skin is warm, dry and supple bilateral. Nails x 10 are well maintained; remaining integument appears unremarkable at this time. There are no open sores, no preulcerative lesions, no rash or signs of infection present.  Vascular: Dorsalis Pedis artery and Posterior Tibial artery pedal pulses are 2/4 bilateral with immedate capillary fill time. Pedal hair growth present. No varicosities and no lower extremity edema present bilateral.   Neruologic: Grossly intact via light touch bilateral. Vibratory intact via tuning fork bilateral. Protective threshold with Semmes Wienstein monofilament intact to all pedal sites bilateral. Patellar and Achilles deep tendon reflexes 2+ bilateral. No Babinski or clonus noted bilateral.   Musculoskeletal: No gross boney pedal deformities bilateral. No pain, crepitus, or limitation noted with foot and ankle range of motion bilateral.  Muscular strength 5/5 in all groups tested bilateral.  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  This area in question the fourth fifth tarsometatarsal cuboid articulation does not demonstrate any type of erythematous or inflammatory process at this point though she does have a very large spur overlying this joint.  Does not demonstrate any skin breakdown in that area.  Most likely this was gout attack.  Gait: Unassisted, Nonantalgic.    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  I would like to be able to see this immediately should this recur.     Azeez Dunker T. [place], [place]

## 2024-03-15 DIAGNOSIS — Z961 Presence of intraocular lens: Secondary | ICD-10-CM | POA: Diagnosis not present

## 2024-03-29 DIAGNOSIS — L820 Inflamed seborrheic keratosis: Secondary | ICD-10-CM | POA: Diagnosis not present

## 2024-03-29 DIAGNOSIS — D225 Melanocytic nevi of trunk: Secondary | ICD-10-CM | POA: Diagnosis not present

## 2024-03-29 DIAGNOSIS — Z85820 Personal history of malignant melanoma of skin: Secondary | ICD-10-CM | POA: Diagnosis not present

## 2024-03-29 DIAGNOSIS — L814 Other melanin hyperpigmentation: Secondary | ICD-10-CM | POA: Diagnosis not present

## 2024-03-29 DIAGNOSIS — L649 Androgenic alopecia, unspecified: Secondary | ICD-10-CM | POA: Diagnosis not present

## 2024-03-29 DIAGNOSIS — Z85828 Personal history of other malignant neoplasm of skin: Secondary | ICD-10-CM | POA: Diagnosis not present

## 2024-03-29 DIAGNOSIS — L570 Actinic keratosis: Secondary | ICD-10-CM | POA: Diagnosis not present

## 2024-03-29 DIAGNOSIS — L821 Other seborrheic keratosis: Secondary | ICD-10-CM | POA: Diagnosis not present

## 2024-03-29 DIAGNOSIS — L905 Scar conditions and fibrosis of skin: Secondary | ICD-10-CM | POA: Diagnosis not present

## 2024-03-29 DIAGNOSIS — L280 Lichen simplex chronicus: Secondary | ICD-10-CM | POA: Diagnosis not present

## 2024-03-29 DIAGNOSIS — D692 Other nonthrombocytopenic purpura: Secondary | ICD-10-CM | POA: Diagnosis not present
